# Patient Record
Sex: Male | Born: 1960 | Race: White | Hispanic: No | Marital: Married | Smoking: Former smoker
Health system: Southern US, Community
[De-identification: ages and names within clinical notes are randomized; demographics above are authoritative.]

## PROBLEM LIST (undated history)

## (undated) DIAGNOSIS — K8020 Calculus of gallbladder without cholecystitis without obstruction: Secondary | ICD-10-CM

## (undated) DIAGNOSIS — Z8679 Personal history of other diseases of the circulatory system: Secondary | ICD-10-CM

## (undated) DIAGNOSIS — I10 Essential (primary) hypertension: Secondary | ICD-10-CM

## (undated) DIAGNOSIS — E785 Hyperlipidemia, unspecified: Secondary | ICD-10-CM

## (undated) DIAGNOSIS — F419 Anxiety disorder, unspecified: Secondary | ICD-10-CM

## (undated) DIAGNOSIS — I480 Paroxysmal atrial fibrillation: Secondary | ICD-10-CM

## (undated) DIAGNOSIS — I5032 Chronic diastolic (congestive) heart failure: Secondary | ICD-10-CM

## (undated) DIAGNOSIS — M1990 Unspecified osteoarthritis, unspecified site: Secondary | ICD-10-CM

## (undated) DIAGNOSIS — I2510 Atherosclerotic heart disease of native coronary artery without angina pectoris: Secondary | ICD-10-CM

## (undated) DIAGNOSIS — I422 Other hypertrophic cardiomyopathy: Secondary | ICD-10-CM

## (undated) DIAGNOSIS — Z8719 Personal history of other diseases of the digestive system: Secondary | ICD-10-CM

## (undated) DIAGNOSIS — G4733 Obstructive sleep apnea (adult) (pediatric): Secondary | ICD-10-CM

## (undated) DIAGNOSIS — K219 Gastro-esophageal reflux disease without esophagitis: Secondary | ICD-10-CM

## (undated) DIAGNOSIS — Z9289 Personal history of other medical treatment: Secondary | ICD-10-CM

## (undated) DIAGNOSIS — I340 Nonrheumatic mitral (valve) insufficiency: Secondary | ICD-10-CM

## (undated) DIAGNOSIS — F329 Major depressive disorder, single episode, unspecified: Secondary | ICD-10-CM

## (undated) DIAGNOSIS — F32A Depression, unspecified: Secondary | ICD-10-CM

## (undated) DIAGNOSIS — Z9889 Other specified postprocedural states: Secondary | ICD-10-CM

## (undated) HISTORY — DX: Gastro-esophageal reflux disease without esophagitis: K21.9

## (undated) HISTORY — DX: Other hypertrophic cardiomyopathy: I42.2

## (undated) HISTORY — DX: Nonrheumatic mitral (valve) insufficiency: I34.0

## (undated) HISTORY — DX: Chronic diastolic (congestive) heart failure: I50.32

## (undated) HISTORY — PX: TONSILLECTOMY: SUR1361

## (undated) HISTORY — DX: Atherosclerotic heart disease of native coronary artery without angina pectoris: I25.10

## (undated) HISTORY — DX: Anxiety disorder, unspecified: F41.9

## (undated) HISTORY — DX: Paroxysmal atrial fibrillation: I48.0

## (undated) HISTORY — DX: Essential (primary) hypertension: I10

## (undated) HISTORY — DX: Calculus of gallbladder without cholecystitis without obstruction: K80.20

## (undated) HISTORY — PX: WISDOM TOOTH EXTRACTION: SHX21

## (undated) HISTORY — DX: Hyperlipidemia, unspecified: E78.5

## (undated) HISTORY — DX: Morbid (severe) obesity due to excess calories: E66.01

## (undated) HISTORY — PX: HAND SURGERY: SHX662

## (undated) HISTORY — DX: Depression, unspecified: F32.A

## (undated) HISTORY — DX: Obstructive sleep apnea (adult) (pediatric): G47.33

## (undated) HISTORY — DX: Personal history of other medical treatment: Z92.89

## (undated) HISTORY — DX: Major depressive disorder, single episode, unspecified: F32.9

---

## 2001-11-11 ENCOUNTER — Encounter (HOSPITAL_COMMUNITY): Payer: Self-pay | Admitting: Oncology

## 2001-11-11 ENCOUNTER — Ambulatory Visit (HOSPITAL_COMMUNITY): Admission: RE | Admit: 2001-11-11 | Discharge: 2001-11-11 | Payer: Self-pay | Admitting: Oncology

## 2001-11-20 ENCOUNTER — Other Ambulatory Visit: Admission: RE | Admit: 2001-11-20 | Discharge: 2001-11-20 | Payer: Self-pay

## 2003-04-17 ENCOUNTER — Emergency Department (HOSPITAL_COMMUNITY): Admission: EM | Admit: 2003-04-17 | Discharge: 2003-04-17 | Payer: Self-pay | Admitting: Emergency Medicine

## 2004-04-12 ENCOUNTER — Ambulatory Visit (HOSPITAL_COMMUNITY): Admission: RE | Admit: 2004-04-12 | Discharge: 2004-04-12 | Payer: Self-pay | Admitting: Orthopedic Surgery

## 2004-04-12 ENCOUNTER — Ambulatory Visit (HOSPITAL_BASED_OUTPATIENT_CLINIC_OR_DEPARTMENT_OTHER): Admission: RE | Admit: 2004-04-12 | Discharge: 2004-04-12 | Payer: Self-pay | Admitting: Orthopedic Surgery

## 2008-08-03 ENCOUNTER — Ambulatory Visit: Payer: Self-pay | Admitting: Family Medicine

## 2008-08-03 DIAGNOSIS — E785 Hyperlipidemia, unspecified: Secondary | ICD-10-CM | POA: Insufficient documentation

## 2008-08-03 DIAGNOSIS — Z87442 Personal history of urinary calculi: Secondary | ICD-10-CM | POA: Insufficient documentation

## 2008-08-03 LAB — CONVERTED CEMR LAB
Bilirubin Urine: NEGATIVE
Glucose, Urine, Semiquant: NEGATIVE
Ketones, urine, test strip: NEGATIVE
Nitrite: NEGATIVE
Specific Gravity, Urine: <1.005
Urobilinogen, UA: 0.2
WBC Urine, dipstick: NEGATIVE
pH: 7.5

## 2008-08-31 ENCOUNTER — Ambulatory Visit: Payer: Self-pay | Admitting: Family Medicine

## 2008-08-31 LAB — CONVERTED CEMR LAB
ALT: 42 units/L (ref 0–53)
AST: 32 units/L (ref 0–37)
Albumin: 4.1 g/dL (ref 3.5–5.2)
Alkaline Phosphatase: 74 units/L (ref 39–117)
BUN: 10 mg/dL (ref 6–23)
Basophils Absolute: 0 10*3/uL (ref 0.0–0.1)
Basophils Relative: 0.1 % (ref 0.0–3.0)
Bilirubin Urine: NEGATIVE
Bilirubin, Direct: 0.1 mg/dL (ref 0.0–0.3)
Blood in Urine, dipstick: NEGATIVE
CO2: 31 meq/L (ref 19–32)
Calcium: 9.2 mg/dL (ref 8.4–10.5)
Chloride: 106 meq/L (ref 96–112)
Cholesterol: 192 mg/dL (ref 0–200)
Creatinine, Ser: 0.9 mg/dL (ref 0.4–1.5)
Eosinophils Absolute: 0.1 10*3/uL (ref 0.0–0.7)
Eosinophils Relative: 1.4 % (ref 0.0–5.0)
GFR calc Af Amer: 117 mL/min
GFR calc non Af Amer: 97 mL/min
Glucose, Bld: 110 mg/dL — ABNORMAL HIGH (ref 70–99)
Glucose, Urine, Semiquant: NEGATIVE
HCT: 39.4 % (ref 39.0–52.0)
HDL: 23.9 mg/dL — ABNORMAL LOW (ref >39.0)
Hemoglobin: 13.4 g/dL (ref 13.0–17.0)
Ketones, urine, test strip: NEGATIVE
LDL Cholesterol: 141 mg/dL — ABNORMAL HIGH (ref 0–99)
Lymphocytes Relative: 29.4 % (ref 12.0–46.0)
MCHC: 34.1 g/dL (ref 30.0–36.0)
MCV: 89.4 fL (ref 78.0–100.0)
Monocytes Absolute: 0.6 10*3/uL (ref 0.1–1.0)
Monocytes Relative: 7.9 % (ref 3.0–12.0)
Neutro Abs: 4.6 10*3/uL (ref 1.4–7.7)
Neutrophils Relative %: 61.2 % (ref 43.0–77.0)
Nitrite: NEGATIVE
Platelets: 246 10*3/uL (ref 150–400)
Potassium: 4.1 meq/L (ref 3.5–5.1)
RBC: 4.41 M/uL (ref 4.22–5.81)
RDW: 12.7 % (ref 11.5–14.6)
Sodium: 142 meq/L (ref 135–145)
Specific Gravity, Urine: 1.02
TSH: 3.53 microintl units/mL (ref 0.35–5.50)
Total Bilirubin: 0.9 mg/dL (ref 0.3–1.2)
Total CHOL/HDL Ratio: 8
Total Protein: 7.2 g/dL (ref 6.0–8.3)
Triglycerides: 138 mg/dL (ref 0–149)
Urobilinogen, UA: 1
VLDL: 28 mg/dL (ref 0–40)
WBC Urine, dipstick: NEGATIVE
WBC: 7.5 10*3/uL (ref 4.5–10.5)
pH: 8

## 2008-09-09 ENCOUNTER — Ambulatory Visit: Payer: Self-pay | Admitting: Family Medicine

## 2008-09-09 DIAGNOSIS — K219 Gastro-esophageal reflux disease without esophagitis: Secondary | ICD-10-CM | POA: Insufficient documentation

## 2008-09-09 DIAGNOSIS — F411 Generalized anxiety disorder: Secondary | ICD-10-CM | POA: Insufficient documentation

## 2009-05-02 ENCOUNTER — Telehealth: Payer: Self-pay | Admitting: Family Medicine

## 2009-05-17 ENCOUNTER — Encounter: Payer: Self-pay | Admitting: Family Medicine

## 2011-01-05 NOTE — Op Note (Signed)
NAME:  Harold Scott, Harold Scott                          ACCOUNT NO.:  [id number]   MEDICAL RECORD NO.:  [id number]                   PATIENT TYPE:  AMB   LOCATION:  DSC                                  FACILITY:  [facility]   PHYSICIAN:  Artist Pais. Mina Marble, M.D.           DATE OF BIRTH:  20-Apr-1961   DATE OF PROCEDURE:  04/12/2004  DATE OF DISCHARGE:                                 OPERATIVE REPORT   PREOPERATIVE DIAGNOSIS:  Right fifth metacarpal intraarticular fracture,  metacarpophalangeal joint.   POSTOPERATIVE DIAGNOSIS:  Right fifth metacarpal intraarticular fracture,  metacarpophalangeal joint.   PROCEDURE:  Open reduction and internal fixation of the above fracture using  a 1.5 mm modular hand set T plate with four screws distally and four  proximal.   SURGEON:  Artist Pais. Mina Marble, M.D.   ASSISTANT:  None.   ANESTHESIA:  Axillary block.   TOURNIQUET TIME:  1 hour.   COMPLICATIONS:  None.   DRAINS:  None.   DESCRIPTION OF PROCEDURE:  The patient was taken to the operating room and  after the induction of adequate axillary block analgesia, the right upper  extremity was prepped and draped in the usual sterile fashion.  An Esmarch  was used to exsanguinate the limb and the tourniquet was inflated to 250  mmHg.  At this point in time, a longitudinal incision was made over the  metacarpophalangeal joint of the fifth digit on the right hand.  Skin flaps  were raised accordingly.  The juncture between the extensor mechanism of the  ring and small fingers was incised and the extensor mechanism of the little  finger was retracted ulnarly. At this point in time, a dorsal arthrotomy was  performed.  Upon entering the joint, there was a complex intraarticular  fracture of the metacarpal head and neck junction.  The fracture site was  opened and debrided of clot. It was reduced with an 0.45 K wire and then  plated using a 1.5 mm modular hand set T plate with four screws distally and  four  proximal.  Intraoperative x-ray showed good reduction in both the AP,  lateral and oblique views.  The wound was thoroughly irrigated.  The capsule  was closed with 4-0 Vicryl, the juncture with 4-0 Vicryl and the skin with a  running 3-0 Prolene subcuticular stitch.  Steri-Strips, 4 x 4's, Fluffs and  a compressive dressing and ulnar guard splint were applied.  The patient  tolerated the procedure well.                                               Artist Pais Mina Marble, M.D.    MAW/MEDQ  D:  04/12/2004  T:  04/12/2004  Job:  [id number]

## 2011-07-21 ENCOUNTER — Emergency Department (HOSPITAL_COMMUNITY): Payer: PRIVATE HEALTH INSURANCE

## 2011-07-21 ENCOUNTER — Emergency Department (HOSPITAL_COMMUNITY)
Admission: EM | Admit: 2011-07-21 | Discharge: 2011-07-22 | Disposition: A | Discharge status: Home / Self Care | Payer: PRIVATE HEALTH INSURANCE | Attending: Emergency Medicine | Admitting: Emergency Medicine

## 2011-07-21 ENCOUNTER — Encounter: Payer: Self-pay | Admitting: *Deleted

## 2011-07-21 DIAGNOSIS — R000 Tachycardia, unspecified: Secondary | ICD-10-CM | POA: Insufficient documentation

## 2011-07-21 DIAGNOSIS — I4891 Unspecified atrial fibrillation: Secondary | ICD-10-CM | POA: Insufficient documentation

## 2011-07-21 DIAGNOSIS — R079 Chest pain, unspecified: Secondary | ICD-10-CM | POA: Insufficient documentation

## 2011-07-21 LAB — DIFFERENTIAL
Basophils Absolute: 0 10*3/uL (ref 0.0–0.1)
Basophils Relative: 0 % (ref 0–1)
Eosinophils Relative: 1 % (ref 0–5)
Monocytes Absolute: 1.3 10*3/uL — ABNORMAL HIGH (ref 0.1–1.0)

## 2011-07-21 LAB — BASIC METABOLIC PANEL
CO2: 23 mEq/L (ref 19–32)
Calcium: 9.9 mg/dL (ref 8.4–10.5)
Creatinine, Ser: 0.91 mg/dL (ref 0.50–1.35)
GFR calc Af Amer: >90 mL/min (ref >90)

## 2011-07-21 LAB — CBC
HCT: 42.2 % (ref 39.0–52.0)
MCHC: 34.1 g/dL (ref 30.0–36.0)
MCV: 87.2 fL (ref 78.0–100.0)
RDW: 13.5 % (ref 11.5–15.5)

## 2011-07-21 MED ORDER — SODIUM CHLORIDE 0.9 % IV SOLN
INTRAVENOUS | Status: DC
  Administered 2011-07-21: 20:00:00 via INTRAVENOUS

## 2011-07-21 MED ORDER — DEXTROSE 5 % IV SOLN
5.0000 mg/h | INTRAVENOUS | Status: DC
  Administered 2011-07-21: 5 mg/h via INTRAVENOUS
  Filled 2011-07-21: qty 100

## 2011-07-21 MED ORDER — DILTIAZEM HCL 50 MG/10ML IV SOLN
10.0000 mg | Freq: Once | INTRAVENOUS | Status: AC
  Administered 2011-07-21: 10 mg via INTRAVENOUS
  Filled 2011-07-21: qty 2

## 2011-07-21 MED ORDER — METOPROLOL SUCCINATE ER 25 MG PO TB24: 25.0000 mg | ORAL_TABLET | Freq: Every day | ORAL | Status: DC

## 2011-07-21 MED ORDER — SODIUM CHLORIDE 0.9 % IV BOLUS (SEPSIS)
500.0000 mL | Freq: Once | INTRAVENOUS | Status: AC
  Administered 2011-07-21: 19:00:00 via INTRAVENOUS

## 2011-07-21 MED ORDER — SODIUM CHLORIDE 0.9 % IV SOLN
Freq: Once | INTRAVENOUS | Status: AC
  Administered 2011-07-21: 20:00:00 via INTRAVENOUS

## 2011-07-21 NOTE — ED Notes (Signed)
Susie from Lab called reported CKMB 29.8,  Will notify Dr Adriana Simas immediately

## 2011-07-21 NOTE — ED Notes (Signed)
Pt presents with chest pain, states he feels his heart rate is irregular, clammy, pale and anxious

## 2011-07-21 NOTE — ED Provider Notes (Signed)
History     CSN: [id number] Arrival date & time: 07/21/2011  6:24 PM   First MD Initiated Contact with Patient 07/21/11 1842      Chief Complaint  Patient presents with  . Chest Pain    (Consider location/radiation/quality/duration/timing/severity/associated sxs/prior treatment) HPI....Marland Kitchen irregular heartbeat a brief time ago. Similar symptoms approximately one week ago. No chest pain, dyspnea, diaphoresis, nausea.  Nonsmoker.  Patient admits to being an anxious person. Her last stress at work and at home. Nothing makes symptoms better or worse. Described as moderate.  No past medical history on file.  No past surgical history on file.  No family history on file.  History  Substance Use Topics  . Smoking status: Not on file  . Smokeless tobacco: Not on file  . Alcohol Use: Not on file      Review of Systems  All other systems reviewed and are negative.    Allergies  Penicillins  Home Medications   Current Outpatient Rx  Name Route Sig Dispense Refill  . CLORAZEPATE DIPOTASSIUM 7.5 MG PO TABS Oral Take 7.5 mg by mouth at bedtime.      Marland Kitchen ESOMEPRAZOLE MAGNESIUM 40 MG PO CPDR Oral Take 40 mg by mouth daily before breakfast.      . VITAMIN B-12 1000 MCG PO TABS Oral Take 1,000 mcg by mouth daily.      Marland Kitchen VITAMIN C 500 MG PO TABS Oral Take 500 mg by mouth daily.        BP 115/70  Pulse 78  Temp(Src) 98.4 F (36.9 C) (Oral)  Resp 17  SpO2 99%  Physical Exam  Nursing note and vitals reviewed. Constitutional: He is oriented to person, place, and time. He appears well-developed and well-nourished.  HENT:  Head: Normocephalic and atraumatic.  Eyes: Conjunctivae and EOM are normal. Pupils are equal, round, and reactive to light.  Neck: Normal range of motion. Neck supple.  Cardiovascular:       Irregularly irregular rhythm,  tachycardic  Pulmonary/Chest: Effort normal and breath sounds normal.  Abdominal: Soft. Bowel sounds are normal.  Musculoskeletal: Normal  range of motion.  Neurological: He is alert and oriented to person, place, and time.  Skin: Skin is warm and dry.  Psychiatric: He has a normal mood and affect.    ED Course  Procedures (including critical care time)  Labs Reviewed  CBC - Abnormal; Notable for the following:    WBC 13.7 (*)    All other components within normal limits  DIFFERENTIAL - Abnormal; Notable for the following:    Neutro Abs 8.5 (*)    Monocytes Absolute 1.3 (*)    All other components within normal limits  BASIC METABOLIC PANEL - Abnormal; Notable for the following:    Potassium 3.3 (*)    Glucose, Bld 103 (*)    All other components within normal limits  CARDIAC PANEL(CRET KIN+CKTOT+MB+TROPI) - Abnormal; Notable for the following:    Total CK 1029 (*)    All other components within normal limits  TSH   Dg Chest Port 1 View  07/21/2011  *RADIOLOGY REPORT*  Clinical Data: Atrial fibrillation  PORTABLE CHEST - 1 VIEW  Comparison: None  Findings: Heart and mediastinal contours are within normal limits for portable technique.  Lung volumes are low.  There is slight peribronchial thickening.  No airspace disease or effusion.  Healed right clavicle fracture.  No acute bony abnormality.  IMPRESSION: Peribronchial thickening, which could be related to smoking and may be chronic.  No definite acute findings.  Original Report Authenticated By: Britta Mccreedy, M.D.     No diagnosis found.   Date: 07/21/2011  Rate: 171  Rhythm: atrial fibrillation  QRS Axis: normal  Intervals: normal  ST/T Wave abnormalities: normal  Conduction Disutrbances:none  Narrative Interpretation:   Old EKG Reviewed: none available    Date: 07/21/2011  Rate: 103  Rhythm: atrial fibrillation  QRS Axis: normal  Intervals: normal  ST/T Wave abnormalities: normal  Conduction Disutrbances:none  Narrative Interpretation:   Old EKG Reviewed: changes noted  CRITICAL CARE Performed by: Donnetta Hutching   Total critical care time:  30...  Critical care time was exclusive of separately billable procedures and treating other patients.  Critical care was necessary to treat or prevent imminent or life-threatening deterioration.  Critical care was time spent personally by me on the following activities: development of treatment plan with patient and/or surrogate as well as nursing, discussions with consultants, evaluation of patient's response to treatment, examination of patient, obtaining history from patient or surrogate, ordering and performing treatments and interventions, ordering and review of laboratory studies, ordering and review of radiographic studies, pulse oximetry and re-evaluation of patient's condition. MDM  Initial EKG shows atrial fibrillation rate greater than 120.  Patient responded well to IV Cardizem.  He was rechecked several times and hospital course. He prefers not to be admitted to the hospital. Start a low-dose beta blocker and have him followup with his primary care doctor on Monday. he understands to return if symptoms return. This was discussed with his wife also        Donnetta Hutching, MD 07/21/11 [phone]

## 2011-07-22 LAB — TSH: TSH: 11.502 u[IU]/mL — ABNORMAL HIGH (ref 0.350–4.500)

## 2011-07-22 LAB — CARDIAC PANEL(CRET KIN+CKTOT+MB+TROPI): Total CK: 1029 U/L — ABNORMAL HIGH (ref 7–232)

## 2011-07-24 ENCOUNTER — Encounter: Payer: Self-pay | Admitting: Family Medicine

## 2011-07-24 ENCOUNTER — Ambulatory Visit (INDEPENDENT_AMBULATORY_CARE_PROVIDER_SITE_OTHER): Payer: PRIVATE HEALTH INSURANCE | Admitting: Family Medicine

## 2011-07-24 DIAGNOSIS — K649 Unspecified hemorrhoids: Secondary | ICD-10-CM

## 2011-07-24 DIAGNOSIS — K219 Gastro-esophageal reflux disease without esophagitis: Secondary | ICD-10-CM

## 2011-07-24 DIAGNOSIS — I480 Paroxysmal atrial fibrillation: Secondary | ICD-10-CM

## 2011-07-24 DIAGNOSIS — I4819 Other persistent atrial fibrillation: Secondary | ICD-10-CM | POA: Insufficient documentation

## 2011-07-24 DIAGNOSIS — I4891 Unspecified atrial fibrillation: Secondary | ICD-10-CM

## 2011-07-24 MED ORDER — HYDROCORTISONE 2.5 % RE CREA: TOPICAL_CREAM | RECTAL | Status: DC

## 2011-07-24 NOTE — Progress Notes (Signed)
  Subjective:    Patient ID: Harold Scott, male    DOB: 22-Jun-1961, 50 y.o.   MRN: [id number]  HPI Harold Scott  Is a 50 year old, married male, nonsmoker, who comes in today following an evaluation in the emergency room for abdominal and chest pain.  He relates 4 episodes over the past 6 weeks of severe midepigastric abdominal pain.  He states the first episode happened at 9 a.m. On October the 11th of this year.  He was eating a couple doughnuts and coffee and then reclined on his seat in his truck and developed midepigastric abdominal pain, which radiated up into his throat.  He sat up and drank some Sprite and in some water and his symptoms went away.  No cardiac symptoms in the event occurred at rest.  Dannielle Huh had two other episodes after that and then a fourth episode on December the first.  That episode.  He was at home working.  He ate some food from a fast food restaurant and laid down at home and hour  a half later woke up with severe midepigastric abdominal pain.  He went to the emergency room and was found to be in atrial fibrillation.  He was given IV Cardizem and declined admission.  He was discharged on a beta-blocker in sinus rhythm.  Toprol 25 mg daily.  He has never had a history of any cardiac symptoms in the past.  He has had a history of reflux esophagitis and has been on Nexium 40 mg daily.   Review of Systems Cardiac pulmonary, and GI review of systems other than above, negative.  He is a nonsmoker.  He does drink a fair amount of caffeine.  Consumes chewing gum, and one or two alcohol beverages per day    Objective:   Physical Exam  Well developed, well nourished, male no acute distress.  Cardiopulmonary exam negative dumps, and negative      Assessment & Plan:  Reflux esophagitis, uncontrolled with Nexium 40 mg daily.  Plan GI consult, fat free diet.  Also, question gallbladder disease.  Ultrasound of the gallbladder.  Episodic history of AF now in sinus rhythm.   Cardiac evaluation following GI evaluation.  Continue beta blocker 25 mg daily

## 2011-07-24 NOTE — Patient Instructions (Signed)
Complete fat free diet.  No alcohol, caffeine, or peppermint.  Sleep on two pillows.  Double the Nexium to one twice daily.  We will get you set up for a GI consult ASAP and an ultrasound of her gallbladder.  Once we do the GI evaluation, then we will get you set up for a follow-up cardiac evaluation

## 2011-07-24 NOTE — Progress Notes (Signed)
Addended by: Kern Reap B on: 07/24/2011 02:32 PM   Modules accepted: Orders

## 2011-07-25 ENCOUNTER — Other Ambulatory Visit: Payer: Self-pay | Admitting: Family Medicine

## 2011-07-25 ENCOUNTER — Encounter: Payer: Self-pay | Admitting: Gastroenterology

## 2011-07-25 MED ORDER — ESOMEPRAZOLE MAGNESIUM 40 MG PO CPDR: 40.0000 mg | DELAYED_RELEASE_CAPSULE | Freq: Two times a day (BID) | ORAL | Status: DC

## 2011-07-25 NOTE — Telephone Encounter (Signed)
Pt was told to double up on nexium 40mg . Pt need new rx sent to [facility] [phone]. Pt needs to know last pneumonia shot.

## 2011-07-25 NOTE — Telephone Encounter (Signed)
Rx sent and spoke with patient. 

## 2011-08-08 ENCOUNTER — Encounter: Payer: Self-pay | Admitting: Gastroenterology

## 2011-08-08 ENCOUNTER — Telehealth: Payer: Self-pay | Admitting: Gastroenterology

## 2011-08-08 ENCOUNTER — Ambulatory Visit
Admission: RE | Admit: 2011-08-08 | Discharge: 2011-08-08 | Disposition: A | Discharge status: Home / Self Care | Payer: PRIVATE HEALTH INSURANCE | Source: Ambulatory Visit | Attending: Family Medicine | Admitting: Family Medicine

## 2011-08-08 ENCOUNTER — Ambulatory Visit (INDEPENDENT_AMBULATORY_CARE_PROVIDER_SITE_OTHER): Payer: PRIVATE HEALTH INSURANCE | Admitting: Gastroenterology

## 2011-08-08 VITALS — BP 130/76 | HR 60 | Ht 68.0 in | Wt 255.8 lb

## 2011-08-08 DIAGNOSIS — K8020 Calculus of gallbladder without cholecystitis without obstruction: Secondary | ICD-10-CM

## 2011-08-08 DIAGNOSIS — K219 Gastro-esophageal reflux disease without esophagitis: Secondary | ICD-10-CM

## 2011-08-08 DIAGNOSIS — K6289 Other specified diseases of anus and rectum: Secondary | ICD-10-CM

## 2011-08-08 MED ORDER — LIDOCAINE (ANORECTAL) 5 % EX GEL: 1.0000 "application " | Freq: Two times a day (BID) | CUTANEOUS | Status: DC

## 2011-08-08 NOTE — Patient Instructions (Addendum)
You have been scheduled for a Endoscopy. Instructions have been provided. We have sent lidocaine gel to your pharmacy, you may use this in conjunction with your hydrocortisone cream that you already have at home. Apply lidocaine to the anal area twice daily as needed for pain. We have given you anti reflux measures to follow. You have been added to our recall system for a colonoscopy in March of 2013. Rectal care instructions have been provided. CC:  Dr Governor Specking

## 2011-08-08 NOTE — Progress Notes (Signed)
History of Present Illness: This is a 50 year old male who has a very long history of GERD and has been treated with PPI therapy for many years. Most recently he has been treated with Nexium 40 mg daily. He notes occasional breakthrough symptoms with heartburn. He also notes a fluttering sensation in his upper chest intermittently and particularly in the mornings. After meals he has had 2 or 3 episodes of mild epigastric pain that resolved within 30-60 minutes. Earlier this month he had an episode of atrial fibrillation associated with palpitations and a fluttering sensation in his upper chest and he was evaluated in the emergency room and discharged home. He underwent abdominal ultrasound today which showed cholelithiasis. In addition he has had significant rectal pain and associated with bowel movements for the past week. These symptoms have improved on stool softeners and sitz baths. Denies weight loss, abdominal pain, constipation, diarrhea, change in stool caliber, melena, hematochezia, nausea, vomiting, dysphagia, reflux symptoms, chest pain.  Review of Systems: Pertinent positive and negative review of systems were noted in the above HPI section. All other review of systems were otherwise negative.  Current Medications, Allergies, Past Medical History, Past Surgical History, Family History and Social History were reviewed in [employer] record.  Physical Exam: General: Well developed , well nourished, obese, no acute distress Head: Normocephalic and atraumatic Eyes:  sclerae anicteric, EOMI Ears: Normal auditory acuity Mouth: No deformity or lesions Neck: Supple, no masses or thyromegaly Lungs: Clear throughout to auscultation Heart: Regular rate and rhythm; no murmurs, rubs or bruits Abdomen: Soft, non tender and non distended. No masses, hepatosplenomegaly or hernias noted. Normal Bowel sounds Rectal: no lesions, significant anal canal tenderness, Hemoccult negative  brown stool  Musculoskeletal: Symmetrical with no gross deformities  Skin: No lesions on visible extremities Pulses:  Normal pulses noted Extremities: No clubbing, cyanosis, edema or deformities noted Neurological: Alert oriented x 4, grossly nonfocal Cervical Nodes:  No significant cervical adenopathy Inguinal Nodes: No significant inguinal adenopathy Psychological:  Alert and cooperative. Anxious  Assessment and Recommendations:  1. Chronic GERD. Symptoms under better control on Nexium twice daily. Continue this therapy along with all standard antireflux measures. I am not certain that the fluttering sensation in his upper chest is related to GERD. Further evaluation with upper endoscopy. The risks, benefits, and alternatives to endoscopy with possible biopsy and possible dilation were discussed with the patient and they consent to proceed.    2. New-onset atrial fibrillation. Further management per Dr. Tawanna Cooler.  3. Cholelithiasis. A portion of the symptoms might be gallstone related, particularly postprandial episodes of epigastric pain, however these symptoms could be reflux related. Pending findings at upper endoscopy and response twice a day PPI therapy, a surgical consult may be needed.  4. Anal pain. Possible anal fissure or hemorrhoid. At the lidocaine cream and all standard rectal care instructions. Continue hydrocortisone cream. Consider further evaluation with colonoscopy symptoms persist.  5. Colorectal cancer screening. Average risk. Plan for colonoscopy at age 78.

## 2011-08-08 NOTE — Telephone Encounter (Signed)
Pharmacy aware that 2% is ok

## 2011-08-09 ENCOUNTER — Telehealth: Payer: Self-pay | Admitting: Gastroenterology

## 2011-08-09 NOTE — Telephone Encounter (Signed)
I spoke with Fleet Contras at Dr Nelida Meuse office.  Dr Tawanna Cooler has verbally asked her to make a surgical referral.  She will schedule this for after the 8th of January following the EGD.  I have explained to the patient that Dr Tawanna Cooler feels he should see a surgeon about his gallbladder and that his office will call him back once the appt has been scheduled.

## 2011-08-09 NOTE — Telephone Encounter (Signed)
Patient called and wanted [place] to communicate with dr Nelida Meuse office about a surgical referral.  According to the patient he was contacted by Dr Nelida Meuse office this am that he needed to see a surgeon about his gallbladder.  He is upset that Dr Russella Dar did not tell him this yesterday in the office.  I have reviewed Dr Ardell Isaacs note with the patient that his plan was to do EGD first and if it was unrevealing then would possibly need surgical referral.  I see no documentation in the chart or orders from Dr Tawanna Cooler about surgical referral.  I have left a message for Dr Nelida Meuse nurse to please call me back to discuss.

## 2011-08-13 ENCOUNTER — Other Ambulatory Visit: Payer: Self-pay | Admitting: Family Medicine

## 2011-08-13 DIAGNOSIS — K8020 Calculus of gallbladder without cholecystitis without obstruction: Secondary | ICD-10-CM

## 2011-08-28 ENCOUNTER — Ambulatory Visit (AMBULATORY_SURGERY_CENTER): Payer: PRIVATE HEALTH INSURANCE | Admitting: Gastroenterology

## 2011-08-28 ENCOUNTER — Encounter: Payer: Self-pay | Admitting: Gastroenterology

## 2011-08-28 DIAGNOSIS — K219 Gastro-esophageal reflux disease without esophagitis: Secondary | ICD-10-CM

## 2011-08-28 DIAGNOSIS — R079 Chest pain, unspecified: Secondary | ICD-10-CM

## 2011-08-28 MED ORDER — SODIUM CHLORIDE 0.9 % IV SOLN: 500.0000 mL | INTRAVENOUS | Status: DC

## 2011-08-28 NOTE — Progress Notes (Signed)
Patient did not experience any of the following events: a burn prior to discharge; a fall within the facility; wrong site/side/patient/procedure/implant event; or a hospital transfer or hospital admission upon discharge from the facility. (G8907) Patient did not have preoperative order for IV antibiotic SSI prophylaxis. (G8918)  

## 2011-08-28 NOTE — Op Note (Signed)
[facility] [address] [place], [place]  [address]  ENDOSCOPY PROCEDURE REPORT  PATIENT:  Harold, Scott  MR#:  [id number] BIRTHDATE:  22-Feb-1961, 50 yrs. old  GENDER:  male ENDOSCOPIST:  Judie Petit T. Russella Dar, MD, [facility] Referred by:  Eugenio Hoes Tawanna Cooler, M.D. PROCEDURE DATE:  08/28/2011 PROCEDURE:  EGD with biopsy, [id number] ASA CLASS:  Class II INDICATIONS:  GERD, chest pain MEDICATIONS:  MAC sedation, administered by CRNA, propofol (Diprivan) 150 mg IV TOPICAL ANESTHETIC:  none DESCRIPTION OF PROCEDURE:   After the risks benefits and alternatives of the procedure were thoroughly explained, informed consent was obtained.  The LB GIF-H180 [id number] endoscope was introduced through the mouth and advanced to the second portion of the duodenum, without limitations.  The instrument was slowly withdrawn as the mucosa was fully examined. <<PROCEDUREIMAGES>> Irregular Z-line at the gastroesophageal junction. Multiple biopsies were obtained and sent to pathology.  Otherwise normal esophagus.  The stomach was entered and closely examined. The pylorus, antrum, angularis, and lesser curvature were well visualized, including a retroflexed view of the cardia and fundus. The stomach wall was normally distensable. The scope passed easily through the pylorus into the duodenum.  The duodenal bulb was normal in appearance, as was the postbulbar duodenum.  Retroflexed views revealed a hiatal hernia, small.  The scope was then withdrawn from the patient and the procedure completed.  COMPLICATIONS:  None  ENDOSCOPIC IMPRESSION: 1) Irregular Z-line at the gastroesophageal junction 2) Small hiatal hernia  RECOMMENDATIONS: 1) Anti-reflux regimen long term 2) Await pathology results 3) Continue PPI long term  Mohini Heathcock T. Russella Dar, MD, [facility]  n. eSIGNED:   Venita Lick. Severin Bou at 08/28/2011 01:47 PM  Alroy Bailiff, [phone]

## 2011-08-29 ENCOUNTER — Telehealth: Payer: Self-pay | Admitting: *Deleted

## 2011-08-29 NOTE — Telephone Encounter (Signed)
Left message

## 2011-08-31 ENCOUNTER — Encounter (INDEPENDENT_AMBULATORY_CARE_PROVIDER_SITE_OTHER): Payer: PRIVATE HEALTH INSURANCE | Admitting: Surgery

## 2011-09-03 ENCOUNTER — Encounter: Payer: Self-pay | Admitting: Gastroenterology

## 2011-09-10 ENCOUNTER — Other Ambulatory Visit: Payer: Self-pay | Admitting: *Deleted

## 2011-09-10 ENCOUNTER — Other Ambulatory Visit: Payer: Self-pay | Admitting: Family Medicine

## 2011-09-10 MED ORDER — METOPROLOL SUCCINATE ER 25 MG PO TB24: 25.0000 mg | ORAL_TABLET | Freq: Every day | ORAL | Status: DC

## 2011-09-10 NOTE — Telephone Encounter (Signed)
Pt need new rx toprol-xl 25 mg [facility]

## 2011-10-10 ENCOUNTER — Other Ambulatory Visit: Payer: Self-pay | Admitting: Family Medicine

## 2011-11-12 ENCOUNTER — Telehealth: Payer: Self-pay | Admitting: *Deleted

## 2011-11-12 NOTE — Telephone Encounter (Signed)
Pt is having episodes of irreg heart rate vs gall bladder pain x months, but cannot come in the office today.  Appt scheduled tomorrow with Dr. Tawanna Cooler.  Advised to go to the ER if any suspicious symptoms or any symptoms at all of AF.  He states he knows what it feels like, and has had it for months.

## 2011-11-13 ENCOUNTER — Encounter: Payer: Self-pay | Admitting: Family Medicine

## 2011-11-13 ENCOUNTER — Ambulatory Visit (INDEPENDENT_AMBULATORY_CARE_PROVIDER_SITE_OTHER): Payer: PRIVATE HEALTH INSURANCE | Admitting: Family Medicine

## 2011-11-13 VITALS — BP 130/90 | Temp 98.5°F | Wt 246.0 lb

## 2011-11-13 DIAGNOSIS — K8020 Calculus of gallbladder without cholecystitis without obstruction: Secondary | ICD-10-CM

## 2011-11-13 DIAGNOSIS — I480 Paroxysmal atrial fibrillation: Secondary | ICD-10-CM

## 2011-11-13 DIAGNOSIS — I4891 Unspecified atrial fibrillation: Secondary | ICD-10-CM

## 2011-11-13 NOTE — Patient Instructions (Signed)
Stay on alcohol and caffeine free diet  We will get you set up for a monitor in cardiology

## 2011-11-13 NOTE — Progress Notes (Signed)
  Subjective:    Patient ID: Harold Scott, male    DOB: 1961-01-22, 51 y.o.   MRN: [id number]  HPI Harold Scott is a 51 year old married male nonsmoker who comes in today for evaluation of rapid heart rate  In the past 2 weeks he's had 2 episodes of rapid heart rate that lasted for couple hours. Both seem to be triggered by alcohol on one occasion he had 3 beers and a shot of bourbon. No chest pain.  He has been on a fat-free diet has lost 7 pounds because he has a history of gallbladder disease and gallstones however he is currently asymptomatic no abdominal pain pc    Review of Systems    general and cardiovascular review of systems otherwise negative Objective:   Physical Exam Well-developed well-nourished male in no acute distress cardiopulmonary exam normal       Assessment & Plan:  Episodes of rapid heart rate plan Holter monitor stop all alcohol and caffeine

## 2011-11-14 ENCOUNTER — Other Ambulatory Visit: Payer: Self-pay | Admitting: Family Medicine

## 2011-11-14 DIAGNOSIS — R000 Tachycardia, unspecified: Secondary | ICD-10-CM | POA: Insufficient documentation

## 2011-11-15 ENCOUNTER — Telehealth: Payer: Self-pay

## 2011-11-19 NOTE — Telephone Encounter (Signed)
PATIENT HAS APPT. FOR MONITOR ON 4/3

## 2011-11-21 ENCOUNTER — Encounter (INDEPENDENT_AMBULATORY_CARE_PROVIDER_SITE_OTHER): Payer: PRIVATE HEALTH INSURANCE

## 2011-11-21 DIAGNOSIS — R000 Tachycardia, unspecified: Secondary | ICD-10-CM

## 2011-11-28 ENCOUNTER — Telehealth: Payer: Self-pay | Admitting: Family Medicine

## 2011-11-28 ENCOUNTER — Ambulatory Visit (INDEPENDENT_AMBULATORY_CARE_PROVIDER_SITE_OTHER): Payer: PRIVATE HEALTH INSURANCE | Admitting: Family Medicine

## 2011-11-28 ENCOUNTER — Encounter: Payer: Self-pay | Admitting: Family Medicine

## 2011-11-28 VITALS — BP 120/84 | Temp 98.2°F | Wt 240.0 lb

## 2011-11-28 DIAGNOSIS — IMO0001 Reserved for inherently not codable concepts without codable children: Secondary | ICD-10-CM | POA: Insufficient documentation

## 2011-11-28 DIAGNOSIS — J45901 Unspecified asthma with (acute) exacerbation: Secondary | ICD-10-CM

## 2011-11-28 MED ORDER — PREDNISONE 20 MG PO TABS: ORAL_TABLET | ORAL | Status: DC

## 2011-11-28 MED ORDER — ALBUTEROL SULFATE HFA 108 (90 BASE) MCG/ACT IN AERS: 2.0000 | INHALATION_SPRAY | Freq: Four times a day (QID) | RESPIRATORY_TRACT | Status: DC | PRN

## 2011-11-28 NOTE — Telephone Encounter (Signed)
Pt was seen today for asthma. Pt has cough would like to know it is ok to take delsym. Pt is aware MD out of office this afternoon. Pt request to speak with triaged nurse

## 2011-11-28 NOTE — Telephone Encounter (Signed)
Discussed with Dr Caryl Never and he said OK to take Delsym for cough

## 2011-11-28 NOTE — Patient Instructions (Signed)
Drink lots of water  Rest at home  Take the prednisone as directed  Albuterol 2 puffs 4 times daily  Return on Friday for followup sooner if any problems

## 2011-11-28 NOTE — Progress Notes (Signed)
  Subjective:    Patient ID: Harold Scott, male    DOB: 08-01-1961, 51 y.o.   MRN: [id number]  HPI Harold Scott is a 51 year old married male nonsmoker who comes in today for evaluation of asthma  He states he felt well until last Saturday afternoon when he developed head congestion postnasal drip and wheezing. [place] beach. They have a trailer at [facility] he does not think there is any mold there he said a history of allergic rhinitis but never asthma   Review of Systems    general and pulmonary review of systems otherwise negative Objective:   Physical Exam Well-developed well-nourished male in no acute distress her perforate 10 and unlabored  HEENT negative neck was supple no adenopathy lungs showed good breath sounds symmetrical bilateral wheezing mild to moderate       Assessment & Plan:  Asthma plan prednisone burst and taper albuterol 2 puffs 4 times a day return in 2 days

## 2011-11-29 ENCOUNTER — Telehealth: Payer: Self-pay | Admitting: Family Medicine

## 2011-11-29 NOTE — Telephone Encounter (Signed)
Spoke to wife

## 2011-11-29 NOTE — Telephone Encounter (Signed)
Patient's spouse called stating that he is not sleeping due to excessive coughing and she wants to know if she can give him her cough meds with codone that Dr. Clent Ridges prescribed for her. Patient has an appt for tomorrow and I offered to have him come back today instead but his wife stated he is too weak to come. Please advise.

## 2011-11-29 NOTE — Telephone Encounter (Signed)
Okay to use cough syrup 1/2-1 teaspoon 3 times daily as needed

## 2011-11-30 ENCOUNTER — Ambulatory Visit (INDEPENDENT_AMBULATORY_CARE_PROVIDER_SITE_OTHER): Payer: PRIVATE HEALTH INSURANCE | Admitting: Family Medicine

## 2011-11-30 ENCOUNTER — Encounter: Payer: Self-pay | Admitting: Family Medicine

## 2011-11-30 VITALS — BP 110/80 | Temp 98.2°F | Wt 241.0 lb

## 2011-11-30 DIAGNOSIS — J45901 Unspecified asthma with (acute) exacerbation: Secondary | ICD-10-CM

## 2011-11-30 DIAGNOSIS — D2330 Other benign neoplasm of skin of unspecified part of face: Secondary | ICD-10-CM | POA: Insufficient documentation

## 2011-11-30 DIAGNOSIS — IMO0001 Reserved for inherently not codable concepts without codable children: Secondary | ICD-10-CM

## 2011-11-30 NOTE — Progress Notes (Signed)
  Subjective:    Patient ID: Harold Scott, male    DOB: 05/31/1961, 51 y.o.   MRN: [id number]  HPI Harold Scott is a 51 year old male who comes in today for followup of asthma and to have a lesion removed from the lower right side of his face  We started him on 60 mg of prednisone 2 days ago he comes back today for followup. States he feels much better but still wheezing. He was able to sleep last night. No side effects from the medication  He's had a lesion on his right lower chin for many years however it seems to be increasing in size he would like it removed     Review of Systems    general pulmonary dermatologic review of systems otherwise negative Objective:   Physical Exam  Well-developed well-nourished male in no acute distress examination along shows symmetrical and increasing breath sounds still wheezing mild to moderate  He was taken to the treatment room and after informed consent the lesion was anesthetized with 1% Xylocaine with epinephrine and removed. The bases cauterized specimen was sent for pathologic analysis clinically appears to be a dysplastic nevus.      Assessment & Plan:  Asthma improving plan Taper prednisone slowly followup in 4 weeks #2 dysplastic nevus removed Path pending

## 2011-11-30 NOTE — Patient Instructions (Signed)
Take 3 prednisone tablets Saturday Sunday Monday and Tuesday begin tapering,,,,,,,,, 2 tabs for 3 days, 1 tab for 3 days, a half a tab for 3 days, then a half a tablet Monday Wednesday Friday for a 3 week taper  Return in 4 weeks for followup sooner if any problems or if you have a problem while on gone then call and see Dr. Ladell Pier.

## 2011-11-30 NOTE — Progress Notes (Signed)
Addended by: Kern Reap B on: 11/30/2011 03:07 PM   Modules accepted: Orders

## 2011-12-27 ENCOUNTER — Encounter: Payer: Self-pay | Admitting: Family Medicine

## 2011-12-27 ENCOUNTER — Ambulatory Visit (INDEPENDENT_AMBULATORY_CARE_PROVIDER_SITE_OTHER): Payer: PRIVATE HEALTH INSURANCE | Admitting: Family Medicine

## 2011-12-27 VITALS — BP 130/90 | Temp 98.4°F | Wt 244.0 lb

## 2011-12-27 DIAGNOSIS — J45901 Unspecified asthma with (acute) exacerbation: Secondary | ICD-10-CM

## 2011-12-27 DIAGNOSIS — IMO0001 Reserved for inherently not codable concepts without codable children: Secondary | ICD-10-CM

## 2011-12-27 DIAGNOSIS — K644 Residual hemorrhoidal skin tags: Secondary | ICD-10-CM

## 2011-12-27 NOTE — Progress Notes (Signed)
  Subjective:    Patient ID: [name], male    DOB: 10/12/1960, 51 y.o.   MRN: [id number]  HPI Mekel is a 51 year old male who comes in today for followup of asthma  He finished his prednisone 10 days ago and states he feels well. We never did really figure out what the trigger was. Since he feels well we elected to treat him with albuterol when necessary prednisone when necessary however if he has more recurrence and we'll consider further diagnostic evaluation. [place] where he thinks the asthma was triggered however he could not find evidence of mold   Review of Systems General and pulmonary review of systems negative    Objective:   Physical Exam Well-developed well-nourished in no acute distress lungs clear to auscultation       Assessment & Plan:  Asthma resolved plan return when necessary  Skin tags x31 right eyelid 3 left removed under local no charge

## 2011-12-27 NOTE — Patient Instructions (Signed)
Use the albuterol when necessary  If you have a flare once or twice a year then take a short course of prednisone  Return when necessary

## 2012-06-11 ENCOUNTER — Encounter: Payer: Self-pay | Admitting: Gastroenterology

## 2012-06-27 ENCOUNTER — Emergency Department (HOSPITAL_COMMUNITY)
Admission: EM | Admit: 2012-06-27 | Discharge: 2012-06-27 | Disposition: A | Discharge status: Home / Self Care | Payer: PRIVATE HEALTH INSURANCE | Attending: Emergency Medicine | Admitting: Emergency Medicine

## 2012-06-27 ENCOUNTER — Emergency Department (HOSPITAL_COMMUNITY): Payer: PRIVATE HEALTH INSURANCE

## 2012-06-27 ENCOUNTER — Encounter (HOSPITAL_COMMUNITY): Payer: Self-pay | Admitting: Emergency Medicine

## 2012-06-27 DIAGNOSIS — R109 Unspecified abdominal pain: Secondary | ICD-10-CM

## 2012-06-27 DIAGNOSIS — Z79899 Other long term (current) drug therapy: Secondary | ICD-10-CM | POA: Insufficient documentation

## 2012-06-27 DIAGNOSIS — I4891 Unspecified atrial fibrillation: Secondary | ICD-10-CM | POA: Insufficient documentation

## 2012-06-27 DIAGNOSIS — F341 Dysthymic disorder: Secondary | ICD-10-CM | POA: Insufficient documentation

## 2012-06-27 DIAGNOSIS — K700 Alcoholic fatty liver: Secondary | ICD-10-CM | POA: Insufficient documentation

## 2012-06-27 DIAGNOSIS — K219 Gastro-esophageal reflux disease without esophagitis: Secondary | ICD-10-CM | POA: Insufficient documentation

## 2012-06-27 DIAGNOSIS — R0602 Shortness of breath: Secondary | ICD-10-CM | POA: Insufficient documentation

## 2012-06-27 DIAGNOSIS — K8020 Calculus of gallbladder without cholecystitis without obstruction: Secondary | ICD-10-CM | POA: Insufficient documentation

## 2012-06-27 DIAGNOSIS — Z87891 Personal history of nicotine dependence: Secondary | ICD-10-CM | POA: Insufficient documentation

## 2012-06-27 DIAGNOSIS — Z8659 Personal history of other mental and behavioral disorders: Secondary | ICD-10-CM | POA: Insufficient documentation

## 2012-06-27 DIAGNOSIS — R079 Chest pain, unspecified: Secondary | ICD-10-CM | POA: Insufficient documentation

## 2012-06-27 LAB — CBC WITH DIFFERENTIAL/PLATELET
HCT: 43.6 % (ref 39.0–52.0)
Hemoglobin: 15.1 g/dL (ref 13.0–17.0)
Lymphocytes Relative: 18 % (ref 12–46)
Lymphs Abs: 2 10*3/uL (ref 0.7–4.0)
MCHC: 34.6 g/dL (ref 30.0–36.0)
Monocytes Absolute: 0.8 10*3/uL (ref 0.1–1.0)
Monocytes Relative: 7 % (ref 3–12)
Neutro Abs: 8.5 10*3/uL — ABNORMAL HIGH (ref 1.7–7.7)
Neutrophils Relative %: 74 % (ref 43–77)
RBC: 4.96 MIL/uL (ref 4.22–5.81)
WBC: 11.4 10*3/uL — ABNORMAL HIGH (ref 4.0–10.5)

## 2012-06-27 LAB — URINALYSIS, MICROSCOPIC ONLY
Bilirubin Urine: NEGATIVE
Glucose, UA: NEGATIVE mg/dL
Ketones, ur: NEGATIVE mg/dL
Leukocytes, UA: NEGATIVE
Nitrite: NEGATIVE
Protein, ur: NEGATIVE mg/dL
pH: 5.5 (ref 5.0–8.0)

## 2012-06-27 LAB — COMPREHENSIVE METABOLIC PANEL
ALT: 34 U/L (ref 0–53)
Alkaline Phosphatase: 88 U/L (ref 39–117)
BUN: 10 mg/dL (ref 6–23)
CO2: 25 mEq/L (ref 19–32)
Chloride: 97 mEq/L (ref 96–112)
GFR calc Af Amer: >90 mL/min (ref >90)
Glucose, Bld: 143 mg/dL — ABNORMAL HIGH (ref 70–99)
Potassium: 4.1 mEq/L (ref 3.5–5.1)
Sodium: 134 mEq/L — ABNORMAL LOW (ref 135–145)
Total Bilirubin: 0.6 mg/dL (ref 0.3–1.2)
Total Protein: 8 g/dL (ref 6.0–8.3)

## 2012-06-27 LAB — POCT I-STAT TROPONIN I

## 2012-06-27 LAB — LACTIC ACID, PLASMA: Lactic Acid, Venous: 2.1 mmol/L (ref 0.5–2.2)

## 2012-06-27 MED ORDER — HYDROMORPHONE HCL PF 1 MG/ML IJ SOLN
1.0000 mg | Freq: Once | INTRAMUSCULAR | Status: AC
  Administered 2012-06-27: 1 mg via INTRAVENOUS
  Filled 2012-06-27: qty 1

## 2012-06-27 MED ORDER — SODIUM CHLORIDE 0.9 % IV BOLUS (SEPSIS)
500.0000 mL | Freq: Once | INTRAVENOUS | Status: AC
  Administered 2012-06-27: 500 mL via INTRAVENOUS

## 2012-06-27 MED ORDER — IOHEXOL 300 MG/ML  SOLN
100.0000 mL | Freq: Once | INTRAMUSCULAR | Status: AC | PRN
  Administered 2012-06-27: 100 mL via INTRAVENOUS

## 2012-06-27 MED ORDER — ONDANSETRON HCL 4 MG/2ML IJ SOLN
4.0000 mg | Freq: Once | INTRAMUSCULAR | Status: AC
  Administered 2012-06-27: 4 mg via INTRAVENOUS
  Filled 2012-06-27: qty 2

## 2012-06-27 MED ORDER — PANTOPRAZOLE SODIUM 40 MG IV SOLR
40.0000 mg | Freq: Once | INTRAVENOUS | Status: AC
  Administered 2012-06-27: 40 mg via INTRAVENOUS
  Filled 2012-06-27: qty 40

## 2012-06-27 MED ORDER — FENTANYL CITRATE 0.05 MG/ML IJ SOLN: 50.0000 ug | Freq: Once | INTRAMUSCULAR | Status: DC

## 2012-06-27 MED ORDER — GI COCKTAIL ~~LOC~~
30.0000 mL | Freq: Once | ORAL | Status: AC
  Administered 2012-06-27: 30 mL via ORAL
  Filled 2012-06-27: qty 30

## 2012-06-27 MED ORDER — HYDROMORPHONE HCL 4 MG PO TABS: 4.0000 mg | ORAL_TABLET | ORAL | Status: DC | PRN

## 2012-06-27 MED ORDER — HYDROMORPHONE HCL PF 2 MG/ML IJ SOLN
2.0000 mg | Freq: Once | INTRAMUSCULAR | Status: AC
  Administered 2012-06-27: 2 mg via INTRAVENOUS
  Filled 2012-06-27: qty 1

## 2012-06-27 NOTE — ED Notes (Signed)
Patient given discharge instructions, information, prescriptions, and diet order. Patient states that they adequately understand discharge information given and to return to ED if symptoms return or worsen.     

## 2012-06-27 NOTE — ED Provider Notes (Signed)
6:07 PM Assumed care from Dr. Silverio Lay and Dr. Sherilyn Dacosta.  Reviewed.  The chart and reevaluated the patient.  I agree with her assessment and plan.  Reviewed the test results.  Discussed results with the patient.  His pain is in the left upper abdomen.  He has no tenderness and no signs of an acute abdomen.  The pain has decreased to 4/10.  He has a gastroenterologist.  He prefers to go home, and followup with his GI doctor rather than be admitted.  Cheri Guppy, MD 06/27/12 [phone]

## 2012-06-27 NOTE — ED Notes (Signed)
Pt presenting to ed with c/o abdominal pain. Pt denies nausea, vomiting and diarrhea at this time. Pt states last normal bowel movement this morning. Pt states history of gallstones. Pt states pain radiates into his chest.

## 2012-06-27 NOTE — ED Provider Notes (Signed)
Medical screening examination/treatment/procedure(s) were conducted as a shared visit with non-physician practitioner(s) and myself.  I personally evaluated the patient during the encounter  Harold Scott is a 51 y.o. male hx of GERD, gallstones here with epigastric pain. He was initially tender on epigastrium and RUQ. US showed gallstones but no cholecystitis. He was still having pain after multiple rounds of dilaudid. Surgery evaluated him and thought that his pain is unlikely from gallstones alone. His lactate is nl. Labs otherwise unremarkable. CT ab/pel ordered. I signed out to Dr. Nino Parsley.    Richardean Canal, MD 06/27/12 1556

## 2012-06-27 NOTE — Consult Note (Signed)
General Surgery [facility]  Patient seen & examined in ER.  Wife at bedside.  Severe, constant left epigastric pain since 4 AM today.  No colic, no radiation, no nausea or emesis.  LFT's normal.  USN with cholelithiasis, but no wall thickening, no dilatation, no pericholecystic fluid, no sonographic Murphy's sign.  Doubt pain is biliary in origin.  Patient has hx of esophagitis.  Will ask GI to evaluate.  No plans for acute operative intervention at present.  Velora Heckler, MD, [facility] Office: [phone]

## 2012-06-27 NOTE — ED Provider Notes (Signed)
History     CSN: [id number]  Arrival date & time 06/27/12  2536   First MD Initiated Contact with Patient 06/27/12 [phone]      Chief Complaint  Patient presents with  . Abdominal Pain    (Consider location/radiation/quality/duration/timing/severity/associated sxs/prior treatment) HPI Comments: Patient awoke from sleep this morning with bilateral upper quadrant abdominal pain and lower chest pain that was severe and nonradiating. It is not associated with nausea or vomiting, diaphoresis, palpitations. Patient had a normal bowel movement this morning. He denies fever. He is shortness of breath he states due to the pain. Patient states that he has history of GERD and takes Nexium daily for this however this is like his typical symptoms. Patient drinks 5-6 beers per night. He denies heavy NSAID use. No history of hypertension, cholesterol, diabetes, smoking. No history of previous heart problems. No history of abdominal surgeries. Onset was acute. Course is constant. Sitting up improves symptoms while lying flat seems to make them worse.   The history is provided by the patient.    Past Medical History  Diagnosis Date  . Atrial fibrillation   . Anxiety   . Depression   . GERD (gastroesophageal reflux disease)   . Gallstones     Past Surgical History  Procedure Date  . Hand surgery   . Tonsillectomy   . Wisdom tooth extraction     Family History  Problem Relation Age of Onset  . Adopted: Yes    History  Substance Use Topics  . Smoking status: Former Smoker    Quit date: 07/23/2006  . Smokeless tobacco: Never Used  . Alcohol Use: Yes     Comment: daily      Review of Systems  Constitutional: Negative for fever.  HENT: Negative for sore throat and rhinorrhea.   Eyes: Negative for redness.  Respiratory: Positive for shortness of breath. Negative for cough.   Cardiovascular: Positive for chest pain.  Gastrointestinal: Positive for abdominal pain. Negative for nausea,  vomiting and diarrhea.  Genitourinary: Negative for dysuria.  Musculoskeletal: Negative for myalgias.  Skin: Negative for rash.  Neurological: Negative for headaches.    Allergies  Penicillins  Home Medications   Current Outpatient Rx  Name  Route  Sig  Dispense  Refill  . ALBUTEROL SULFATE HFA 108 (90 BASE) MCG/ACT IN AERS   Inhalation   Inhale 2 puffs into the lungs every 6 (six) hours as needed for wheezing.   1 Inhaler   1   . CITALOPRAM HYDROBROMIDE 10 MG PO TABS   Oral   Take 10 mg by mouth daily.          Marland Kitchen CLORAZEPATE DIPOTASSIUM 7.5 MG PO TABS   Oral   Take 7.5 mg by mouth at bedtime.          Marland Kitchen ESOMEPRAZOLE MAGNESIUM 40 MG PO CPDR   Oral   Take 40 mg by mouth 2 (two) times daily.         Marland Kitchen METOPROLOL SUCCINATE ER 25 MG PO TB24   Oral   Take 25 mg by mouth daily.         Marland Kitchen VITAMIN B-12 1000 MCG PO TABS   Oral   Take 1,000 mcg by mouth daily.           Marland Kitchen VITAMIN C 500 MG PO TABS   Oral   Take 500 mg by mouth daily.             BP 153/85  Pulse 58  Temp 97.5 F (36.4 C) (Oral)  Resp 18  SpO2 100%  Physical Exam  Nursing note and vitals reviewed. Constitutional: He appears well-developed and well-nourished.  HENT:  Head: Normocephalic and atraumatic.  Eyes: Conjunctivae normal are normal. Right eye exhibits no discharge. Left eye exhibits no discharge.  Neck: Normal range of motion. Neck supple.  Cardiovascular: Normal rate, regular rhythm and normal heart sounds.   Pulmonary/Chest: Effort normal and breath sounds normal. He has no wheezes. He has no rales.  Abdominal: Soft. Bowel sounds are decreased. There is tenderness (significant tenderness) in the right upper quadrant, epigastric area, periumbilical area and left upper quadrant. There is no rigidity, no rebound, no guarding, no CVA tenderness, no tenderness at McBurney's point and negative Murphy's sign.  Neurological: He is alert.  Skin: Skin is warm and dry.  Psychiatric: He  has a normal mood and affect.    ED Course  Procedures (including critical care time)  Labs Reviewed  CBC WITH DIFFERENTIAL - Abnormal; Notable for the following:    WBC 11.4 (*)     Neutro Abs 8.5 (*)     All other components within normal limits  COMPREHENSIVE METABOLIC PANEL - Abnormal; Notable for the following:    Sodium 134 (*)     Glucose, Bld 143 (*)     All other components within normal limits  URINALYSIS, MICROSCOPIC ONLY - Abnormal; Notable for the following:    APPearance CLOUDY (*)     Hgb urine dipstick SMALL (*)     All other components within normal limits  LIPASE, BLOOD  POCT I-STAT TROPONIN I  LACTIC ACID, PLASMA   US Abdomen Complete  06/27/2012  *RADIOLOGY REPORT*  Clinical Data:  Upper abdominal pain.  History of gallstones.  COMPLETE ABDOMINAL ULTRASOUND  Comparison:  Ultrasound 08/08/2011, CT 08/22/2006  Findings:  Gallbladder:  There are multiple mobile gallstones within the gallbladder with posterior shadowing.  Stones measure up to 1.5 cm in number approximately 6 to8.  There is no pericholecystic fluid or  gallbladder wall thickening.  Negative sonographic Murphy's sign.  Common bile duct:  Normal at 5 mm  Liver:  Liver is diffusely increased in echogenicity with no ductal dilatation.  IVC:  Appears normal.  Pancreas:  No focal abnormality seen.  Spleen:  Normal in size and echogenicity  Right Kidney:  12.9 cm in length.  No evidence of hydronephrosis or stones.  Left Kidney:  12.3cm in length.  No evidence of hydronephrosis or stones.  Abdominal aorta:  No aneurysm identified.  IMPRESSION: 1.  Multiple gallstones without evidence of cholecystitis. 2.  Normal common bile duct. 3. Mild increased liver echogenicity commonly represents hepatic steatosis.   Original Report Authenticated By: Genevive Bi, M.D.      1. Abdominal pain     9:12 AM Patient seen and examined. Work-up initiated. Medications ordered. Note metoprolol use.   Vital signs reviewed and  are as follows: Filed Vitals:   06/27/12 0849  BP: 153/85  Pulse: 58  Temp:   Resp: 18    Date: 06/27/2012  Rate: 61  Rhythm: normal sinus rhythm  QRS Axis: normal  Intervals: normal  ST/T Wave abnormalities: nonspecific T wave changes  Conduction Disutrbances:none  Narrative Interpretation:   Old EKG Reviewed: changes noted from 07/21/2011  10:04 AM I-stat troponin did not cross over -- result was 0.00.   Patient and work-up discussed with Dr. Silverio Lay who has seen patient. Will continue with pain control.  Patient continues to have significant tenderness. Surgical consult requested.   Surgical PA has seen in ED. They feel symptoms may be gastritis given epigastric location. I have ordered protonix.   2:32 PM Patient continues to have significant tenderness without vomiting. D/w Dr. Silverio Lay. CT abd/pelvic with contrast ordered to eval for other etiology.    3:27 PM Lactate nml.   3:35 PM Patient re-examined. Pain is down to 3/10. Awaiting CT. Handoff to Dr. Weldon Inches. Plan: dispo per CT results. If neg, d/c home with pain control, surgery f/u (gallbladder), GI f/u.    MDM  Pending CT for evaluation of abdominal pain. Surgery has seen.         Renne Crigler, [place] 06/27/12 1537

## 2012-06-27 NOTE — ED Notes (Signed)
RN to obtain labs with start of IV 

## 2012-06-27 NOTE — Consult Note (Signed)
Reason for Consult: Abdominal Pain and Cholelithiasis Referring Physician: Estevan Oaks PA-C (ER)  Harold Scott is an 51 y.o. male.  HPI: Patient's a 51 year old gentleman in good health. He awoke this morning around 4 AM with significant mid epigastric abdominal pain he was unable to get comfortable and got up around 5 AM. He is tried walking around, ginger and ginger ale. He tried eating some toast around 6 was unable to because he felt so bad. He actually tried going to work to [employer], he works as a [employer] manager. Pain became progressively worse and he ultimately presented to the emergency room and [facility]. The walking did not make the pain worse in fact lying down made it more uncomfortable. He does not have a history of prior biliary colic-like pain. He was seen in December 2012 by Dr. Russella Dar for GERD. He's been on Nexium and his symptoms are much improved. Her current symptoms do not feel like his normal GERD symptoms. The pain remains in the midepigastric area. It is not made worse by palpation. Workup in the ER, shows a WBC of 11.4. CMP is normal. No LFT elevations. Lipase is 27. Urinalysis was cloudy with 0-2 white cells and red cells per high-powered field. Abdominal ultrasound shows multiple mobile gallstones within the gallbladder. Stones measure up to 1.5 cm. There is no pericholecystic fluid or gallbladder wall thickening. Negative Murphy sign. Common bile duct is normal at 5 mm. Liver shows increased at good  the with no ductal dilatation. He does have a history of alcohol use, approximately a case per week of beer. We were asked to see in consultation for acute cholecystitis.  Past Medical History  Diagnosis Date  . Atrial fibrillation 1 episode in the past on BB   . Anxiety   . Depression   . GERD (gastroesophageal reflux disease)   . Gallstones Tobacco use ETOH use  BMI 37     Past Surgical History  Procedure Date  . Hand surgery   . Tonsillectomy   . Wisdom tooth  extraction     Family History  Problem Relation Age of Onset  . Adopted: Yes    Social History:  reports that he quit smoking about 5 years ago. He has never used smokeless tobacco. He reports that he drinks alcohol. He reports that he does not use illicit drugs.TOBACCO: 1PPD 28 years, quit 6 years ago  Etoh- case of beer per week Drugs-none  Married Works as a [employer] manager  Allergies:  Allergies  Allergen Reactions  . Penicillins Hives    REACTION: hives    Medications:  Prior to Admission:  (Not in a hospital admission) Scheduled:   . fentaNYL  50 mcg Intravenous Once  . [COMPLETED] gi cocktail  30 mL Oral Once  . [COMPLETED]  HYDROmorphone (DILAUDID) injection  1 mg Intravenous Once  . [COMPLETED]  HYDROmorphone (DILAUDID) injection  1 mg Intravenous Once  . [COMPLETED]  HYDROmorphone (DILAUDID) injection  2 mg Intravenous Once  . [COMPLETED] ondansetron (ZOFRAN) IV  4 mg Intravenous Once  . [COMPLETED] pantoprazole (PROTONIX) IV  40 mg Intravenous Once   Continuous:   . [COMPLETED] sodium chloride Stopped (06/27/12 1323)   PRN:  Results for orders placed during the hospital encounter of 06/27/12 (from the past 48 hour(s))  CBC WITH DIFFERENTIAL     Status: Abnormal   Collection Time   06/27/12  9:14 AM      Component Value Range Comment   WBC 11.4 (*) 4.0 -  10.5 K/uL    RBC 4.96  4.22 - 5.81 MIL/uL    Hemoglobin 15.1  13.0 - 17.0 g/dL    HCT 45.4  09.8 - 11.9 %    MCV 87.9  78.0 - 100.0 fL    MCH 30.4  26.0 - 34.0 pg    MCHC 34.6  30.0 - 36.0 g/dL    RDW 14.7  82.9 - 56.2 %    Platelets 282  150 - 400 K/uL    Neutrophils Relative 74  43 - 77 %    Neutro Abs 8.5 (*) 1.7 - 7.7 K/uL    Lymphocytes Relative 18  12 - 46 %    Lymphs Abs 2.0  0.7 - 4.0 K/uL    Monocytes Relative 7  3 - 12 %    Monocytes Absolute 0.8  0.1 - 1.0 K/uL    Eosinophils Relative 0  0 - 5 %    Eosinophils Absolute 0.1  0.0 - 0.7 K/uL    Basophils Relative 0  0 - 1 %    Basophils Absolute  0.0  0.0 - 0.1 K/uL   LIPASE, BLOOD     Status: Normal   Collection Time   06/27/12  9:14 AM      Component Value Range Comment   Lipase 27  11 - 59 U/L   COMPREHENSIVE METABOLIC PANEL     Status: Abnormal   Collection Time   06/27/12  9:14 AM      Component Value Range Comment   Sodium 134 (*) 135 - 145 mEq/L    Potassium 4.1  3.5 - 5.1 mEq/L    Chloride 97  96 - 112 mEq/L    CO2 25  19 - 32 mEq/L    Glucose, Bld 143 (*) 70 - 99 mg/dL    BUN 10  6 - 23 mg/dL    Creatinine, Ser 1.30  0.50 - 1.35 mg/dL    Calcium 9.4  8.4 - 86.5 mg/dL    Total Protein 8.0  6.0 - 8.3 g/dL    Albumin 4.3  3.5 - 5.2 g/dL    AST 30  0 - 37 U/L    ALT 34  0 - 53 U/L    Alkaline Phosphatase 88  39 - 117 U/L    Total Bilirubin 0.6  0.3 - 1.2 mg/dL    GFR calc non Af Amer >90  >90 mL/min    GFR calc Af Amer >90  >90 mL/min   URINALYSIS, MICROSCOPIC ONLY     Status: Abnormal   Collection Time   06/27/12  9:19 AM      Component Value Range Comment   Color, Urine YELLOW  YELLOW    APPearance CLOUDY (*) CLEAR    Specific Gravity, Urine 1.023  1.005 - 1.030    pH 5.5  5.0 - 8.0    Glucose, UA NEGATIVE  NEGATIVE mg/dL    Hgb urine dipstick SMALL (*) NEGATIVE    Bilirubin Urine NEGATIVE  NEGATIVE    Ketones, ur NEGATIVE  NEGATIVE mg/dL    Protein, ur NEGATIVE  NEGATIVE mg/dL    Urobilinogen, UA 0.2  0.0 - 1.0 mg/dL    Nitrite NEGATIVE  NEGATIVE    Leukocytes, UA NEGATIVE  NEGATIVE    WBC, UA 0-2  <3 WBC/hpf    RBC / HPF 0-2  <3 RBC/hpf    Bacteria, UA RARE  RARE    Squamous Epithelial / LPF RARE  RARE  US Abdomen Complete  06/27/2012  *RADIOLOGY REPORT*  Clinical Data:  Upper abdominal pain.  History of gallstones.  COMPLETE ABDOMINAL ULTRASOUND  Comparison:  Ultrasound 08/08/2011, CT 08/22/2006  Findings:  Gallbladder:  There are multiple mobile gallstones within the gallbladder with posterior shadowing.  Stones measure up to 1.5 cm in number approximately 6 to8.  There is no pericholecystic fluid  or  gallbladder wall thickening.  Negative sonographic Murphy's sign.  Common bile duct:  Normal at 5 mm  Liver:  Liver is diffusely increased in echogenicity with no ductal dilatation.  IVC:  Appears normal.  Pancreas:  No focal abnormality seen.  Spleen:  Normal in size and echogenicity  Right Kidney:  12.9 cm in length.  No evidence of hydronephrosis or stones.  Left Kidney:  12.3cm in length.  No evidence of hydronephrosis or stones.  Abdominal aorta:  No aneurysm identified.  IMPRESSION: 1.  Multiple gallstones without evidence of cholecystitis. 2.  Normal common bile duct. 3. Mild increased liver echogenicity commonly represents hepatic steatosis.   Original Report Authenticated By: Genevive Bi, M.D.     Review of Systems  Constitutional: Negative.   HENT: Negative.   Eyes: Negative.   Respiratory: Negative.   Cardiovascular: Negative.   Gastrointestinal: Positive for heartburn and abdominal pain (mid epigastric). Negative for nausea, vomiting, diarrhea, constipation, blood in stool and melena.  Genitourinary: Negative.   Musculoskeletal: Negative.   Skin: Negative.   Neurological: Negative.   Endo/Heme/Allergies: Negative.   Psychiatric/Behavioral: Negative.    Blood pressure 141/79, pulse 64, temperature 97.5 F (36.4 C), temperature source Oral, resp. rate 16, SpO2 95.00%. Physical Exam  Constitutional: He is oriented to person, place, and time. He appears well-developed and well-nourished.  HENT:  Head: Normocephalic and atraumatic.  Nose: Nose normal.  Eyes: Conjunctivae normal and EOM are normal. Pupils are equal, round, and reactive to light. Right eye exhibits no discharge. Left eye exhibits no discharge. No scleral icterus.  Neck: Normal range of motion. Neck supple. No JVD present. No tracheal deviation present. No thyromegaly present.  Cardiovascular: Normal rate, regular rhythm, normal heart sounds and intact distal pulses.  Exam reveals no gallop.   No murmur  heard. Respiratory: Effort normal and breath sounds normal. No stridor. No respiratory distress. He has no wheezes. He has no rales. He exhibits no tenderness.  GI: Soft. Bowel sounds are normal. He exhibits no distension and no mass. There is no tenderness. There is no rebound and no guarding.  Musculoskeletal: He exhibits no edema and no tenderness.  Lymphadenopathy:    He has no cervical adenopathy.  Neurological: He is alert and oriented to person, place, and time. No cranial nerve deficit.  Skin: Skin is warm and dry. No erythema.  Psychiatric: He has a normal mood and affect. His behavior is normal. Judgment and thought content normal.    Assessment/Plan: 1. Abdominal pain with cholelithiasis 2.GERD 3.ETOH use and hepatic steatosis 4.Atrial fibrillation on BB 5.Anxiety/depression 6.Hx of tobacco use 7.BMI 37  Plan:  Dr.Gerkin will review.  Not really consistent with biliary colic or acute cholecystitis.  GI ask to see. And we will follow with you. WillJennings PA-C for Dr. Gerrit Friends.  Dymin Dingledine 06/27/2012, 1:15 PM

## 2012-07-10 ENCOUNTER — Encounter (INDEPENDENT_AMBULATORY_CARE_PROVIDER_SITE_OTHER): Payer: Self-pay | Admitting: General Surgery

## 2012-07-10 ENCOUNTER — Ambulatory Visit (INDEPENDENT_AMBULATORY_CARE_PROVIDER_SITE_OTHER): Payer: PRIVATE HEALTH INSURANCE | Admitting: General Surgery

## 2012-07-10 VITALS — BP 122/82 | HR 74 | Temp 98.5°F | Resp 12 | Ht 68.0 in | Wt 252.6 lb

## 2012-07-10 DIAGNOSIS — K8020 Calculus of gallbladder without cholecystitis without obstruction: Secondary | ICD-10-CM

## 2012-07-10 NOTE — Patient Instructions (Signed)
Our office will contact you to schedule surgery. If you do not hear from our office by Monday afternoon, please call us [phone]

## 2012-07-11 NOTE — Progress Notes (Signed)
Patient ID: Harold Scott, male   DOB: 05/17/1961, 51 y.o.   MRN: [id number]  Chief Complaint  Patient presents with  . Abdominal Pain    HPI Harold Scott is a 51 y.o. male.   HPI 51-year-old Caucasian male comes in to discuss gallstone disease. He was seen in the emergency room on November 8 by Dr. Gerkin and will for evaluation for cholelithiasis. The patient states on November 8 he awoke at 4 AM with a severe stomachache. It didn't get much better. He states that it felt like the worse case of gas he ever had. He states however that he went on to work but at around 8 AM he couldn't tolerate the pain anymore which prompted him to go to the [place] emergency department. He states that he felt very bloated. He was in the ER for quite a long time. His workup included an abdominal ultrasound, labs, and ultimately a CT scan. He states that he had a lot of pressure in his epigastric region. Since there is no evidence of acute cholecystitis and his pain improved he was discharged home. He states that he has had an odd feeling in his right upper abdomen for several months. It has been intermittent however over the past several days it is been more of a constant feeling. He describes it as an odd feeling. He states  that this feels like someone is poking him in that spot. He has noticed that certain foods like tomato-based foods increase the discomfort. He did have an episode of nausea and vomiting in the emergency room. He has a history of gastroesophageal reflux disease. He states that this is different. He has had some loose stools intermittently over the past week. He denies any fever, chills, NSAID use, melena, hematochezia. He denies any difficulty swallowing foods. Past Medical History  Diagnosis Date  . Atrial fibrillation   . Anxiety   . Depression   . GERD (gastroesophageal reflux disease)   . Gallstones   . Asthma     Past Surgical History  Procedure Date  . Hand surgery   .  Tonsillectomy   . Wisdom tooth extraction     Family History  Problem Relation Age of Onset  . Adopted: Yes    Social History History  Substance Use Topics  . Smoking status: Former Smoker    Quit date: 07/23/2006  . Smokeless tobacco: Never Used  . Alcohol Use: Yes     Comment: daily    Allergies  Allergen Reactions  . Penicillins Hives    REACTION: hives    Current Outpatient Prescriptions  Medication Sig Dispense Refill  . albuterol (PROVENTIL HFA;VENTOLIN HFA) 108 (90 BASE) MCG/ACT inhaler Inhale 2 puffs into the lungs every 6 (six) hours as needed for wheezing.  1 Inhaler  1  . citalopram (CELEXA) 10 MG tablet Take 10 mg by mouth daily.       . clorazepate (TRANXENE) 7.5 MG tablet Take 7.5 mg by mouth at bedtime.       . esomeprazole (NEXIUM) 40 MG capsule Take 40 mg by mouth 2 (two) times daily.      . metoprolol succinate (TOPROL-XL) 25 MG 24 hr tablet Take 25 mg by mouth daily.      . vitamin B-12 (CYANOCOBALAMIN) 1000 MCG tablet Take 1,000 mcg by mouth daily.        . vitamin C (ASCORBIC ACID) 500 MG tablet Take 500 mg by mouth daily.        .   HYDROmorphone (DILAUDID) 4 MG tablet Take 1 tablet (4 mg total) by mouth every 4 (four) hours as needed for pain.  20 tablet  0    Review of Systems Review of Systems  Constitutional: Negative for fever, chills, appetite change and unexpected weight change.  HENT: Negative for congestion and trouble swallowing.   Eyes: Negative for visual disturbance.  Respiratory: Negative for chest tightness and shortness of breath.   Cardiovascular: Negative for chest pain and leg swelling.       No PND, no orthopnea, no DOE; h/o parx Afib- last episode in Jan; states he has had a stress test in the past.   Gastrointestinal:       See HPI  Genitourinary: Negative for dysuria and hematuria.  Musculoskeletal: Negative.   Skin: Negative for rash.  Neurological: Negative for seizures and speech difficulty.       No TIAs, amaurosis  fugax  Hematological: Does not bruise/bleed easily.  Psychiatric/Behavioral: Negative for behavioral problems and confusion.    Blood pressure 122/82, pulse 74, temperature 98.5 F (36.9 C), resp. rate 12, height 5' 8" (1.727 m), weight 252 lb 9.6 oz (114.579 kg).  Physical Exam Physical Exam  Vitals reviewed. Constitutional: He is oriented to person, place, and time. He appears well-developed. No distress.       obese  HENT:  Head: Normocephalic and atraumatic.  Right Ear: External ear normal.  Left Ear: External ear normal.  Eyes: Conjunctivae normal are normal. No scleral icterus.  Neck: Normal range of motion. Neck supple. No tracheal deviation present.  Cardiovascular: Normal rate, regular rhythm and normal heart sounds.   Pulmonary/Chest: Effort normal and breath sounds normal. No stridor. No respiratory distress. He has no wheezes.  Abdominal: Soft. He exhibits no distension. There is tenderness in the right upper quadrant. There is no rebound and no guarding.       Mild TTP in ruq  Musculoskeletal: Normal range of motion. He exhibits no edema and no tenderness.  Lymphadenopathy:    He has no cervical adenopathy.  Neurological: He is alert and oriented to person, place, and time. He exhibits normal muscle tone.  Skin: Skin is warm and dry. No rash noted. He is not diaphoretic. No erythema.  Psychiatric: He has a normal mood and affect. His behavior is normal. Judgment and thought content normal.    Data Reviewed Dr Gerkin's and Will Jennings' note from 11/8 ED provider note 11/8 Labs 11/8  CT ABDOMEN AND PELVIS WITH CONTRAST  Technique: Multidetector CT imaging of the abdomen and pelvis was  performed following the standard protocol during bolus  administration of intravenous contrast.  Contrast: 100mL OMNIPAQUE IOHEXOL 300 MG/ML SOLN  Comparison: Ultrasound, 06/27/2012. CT, 08/22/2006  Findings: The cardiac silhouette is normal in size. There is minor  subsegmental  atelectasis at the lung bases. Lung bases are  otherwise clear.  There is mild fatty infiltration of the liver. The liver is  otherwise unremarkable. Normal spleen. Multiple gallstones are  noted within an otherwise unremarkable gallbladder. No  pericholecystic inflammation is seen to suggest acute  cholecystitis. No bile duct dilation. The pancreas is within  normal limits. No adrenal masses. Normal kidneys, ureters and  bladder. No adenopathy. No abnormal fluid collections. Normal  bowel. Normal appendix. There are degenerative changes of the  visualized spine most prominent at L4-L5. 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 No osteoblastic or osteolytic lesions.  IMPRESSION:  No acute findings.  CT also demonstrates gallstones but   no evidence of acute  cholecystitis.  Mild fatty infiltration of the liver.  Degenerative changes of the visualized spine with chronic bilateral  pars defects at L5-S1.  COMPLETE ABDOMINAL ULTRASOUND  Comparison: Ultrasound 08/08/2011, CT 08/22/2006  Findings:  Gallbladder: There are multiple mobile gallstones within the  gallbladder with posterior shadowing. Stones measure up to 1.5 cm  in number approximately 6 to8. There is no pericholecystic fluid  or gallbladder wall thickening. Negative sonographic Murphy's  sign.  Common bile duct: Normal at 5 mm  Liver: Liver is diffusely increased in echogenicity with no ductal  dilatation.  IVC: Appears normal.  Pancreas: No focal abnormality seen.  Spleen: Normal in size and echogenicity  Right Kidney: 12.9 cm in length. No evidence of hydronephrosis or  stones.  Left Kidney: 12.3cm in length. No evidence of hydronephrosis or  stones.  Abdominal aorta: No aneurysm identified.  IMPRESSION:  1. Multiple gallstones without evidence of cholecystitis.  2. Normal common bile duct.  3. Mild increased liver echogenicity commonly represents hepatic  steatosis.   Assessment    Symptomatic  cholelithiasis GERD    Plan    I believe the patient's most recent symptoms are consistent with gallbladder disease.  We discussed gallbladder disease. The patient was given educational material. We discussed non-operative and operative management. We discussed the signs & symptoms of acute cholecystitis  I discussed laparoscopic cholecystectomy with IOC in detail.  The patient was given educational material as well as diagrams detailing the procedure.  We discussed the risks and benefits of a laparoscopic cholecystectomy including, but not limited to bleeding, infection, injury to surrounding structures such as the intestine or liver, bile leak, retained gallstones, need to convert to an open procedure, prolonged diarrhea, blood clots such as  DVT, common bile duct injury, anesthesia risks, and possible need for additional procedures.  We discussed the typical post-operative recovery course. I explained that the likelihood of improvement of their symptoms is good.  I did explain that some his symptoms are probably related to GERD and will not improve with cholecystectomy  The patient has elected to proceed with LAPAROSCOPIC CHOLECYSTECTOMY with IOC  Nakai Pollio M. Mialee Weyman, MD, FACS General, Bariatric, & Minimally Invasive Surgery [facility]        Nana Hoselton M 07/11/2012, 10:10 AM    

## 2012-07-25 ENCOUNTER — Other Ambulatory Visit: Payer: Self-pay | Admitting: Family Medicine

## 2012-07-28 ENCOUNTER — Ambulatory Visit (HOSPITAL_COMMUNITY)
Admission: RE | Admit: 2012-07-28 | Discharge: 2012-07-28 | Disposition: A | Discharge status: Home / Self Care | Payer: PRIVATE HEALTH INSURANCE | Source: Ambulatory Visit | Attending: General Surgery | Admitting: General Surgery

## 2012-07-28 ENCOUNTER — Encounter (HOSPITAL_COMMUNITY): Payer: Self-pay

## 2012-07-28 ENCOUNTER — Encounter (HOSPITAL_COMMUNITY): Payer: Self-pay | Admitting: Pharmacy Technician

## 2012-07-28 ENCOUNTER — Encounter (HOSPITAL_COMMUNITY)
Admission: RE | Admit: 2012-07-28 | Discharge: 2012-07-28 | Disposition: A | Discharge status: Home / Self Care | Payer: PRIVATE HEALTH INSURANCE | Source: Ambulatory Visit | Attending: General Surgery | Admitting: General Surgery

## 2012-07-28 DIAGNOSIS — Z01818 Encounter for other preprocedural examination: Secondary | ICD-10-CM | POA: Insufficient documentation

## 2012-07-28 LAB — CBC WITH DIFFERENTIAL/PLATELET
Eosinophils Absolute: 0.2 10*3/uL (ref 0.0–0.7)
Eosinophils Relative: 3 % (ref 0–5)
Hemoglobin: 13.1 g/dL (ref 13.0–17.0)
Lymphs Abs: 2.9 10*3/uL (ref 0.7–4.0)
MCH: 29.8 pg (ref 26.0–34.0)
MCV: 88.8 fL (ref 78.0–100.0)
Monocytes Relative: 10 % (ref 3–12)
Neutrophils Relative %: 56 % (ref 43–77)
RBC: 4.39 MIL/uL (ref 4.22–5.81)

## 2012-07-28 LAB — COMPREHENSIVE METABOLIC PANEL
Alkaline Phosphatase: 81 U/L (ref 39–117)
BUN: 9 mg/dL (ref 6–23)
GFR calc Af Amer: >90 mL/min (ref >90)
GFR calc non Af Amer: >90 mL/min (ref >90)
Glucose, Bld: 88 mg/dL (ref 70–99)
Potassium: 3.9 mEq/L (ref 3.5–5.1)
Total Protein: 6.7 g/dL (ref 6.0–8.3)

## 2012-07-28 LAB — SURGICAL PCR SCREEN
MRSA, PCR: NEGATIVE
Staphylococcus aureus: NEGATIVE

## 2012-07-28 NOTE — Pre-Procedure Instructions (Signed)
20 Harold Scott  07/28/2012   Your procedure is scheduled on:  08/05/12  Report to [facility] at 1130 AM.  Call this number if you have problems the morning of surgery: [phone]   Remember:   Do not eat food:After Midnight.    Take these medicines the morning of surgery with A SIP OF WATER: *all inhalers,celexa,tranxene,nexium,didaudid,toprol   Do not wear jewelry, make-up or nail polish.  Do not wear lotions, powders, or perfumes. You may wear deodorant.  Do not shave 48 hours prior to surgery. Men may shave face and neck.  Do not bring valuables to the hospital.  Contacts, dentures or bridgework may not be worn into surgery.  Leave suitcase in the car. After surgery it may be brought to your room.  For patients admitted to the hospital, checkout time is 11:00 AM the day of discharge.   Patients discharged the day of surgery will not be allowed to drive home.  Name and phone number of your driver: family  Special Instructions: Shower using CHG 2 nights before surgery and the night before surgery.  If you shower the day of surgery use CHG.  Use special wash - you have one bottle of CHG for all showers.  You should use approximately 1/3 of the bottle for each shower.   Please read over the following fact sheets that you were given: Pain Booklet, Coughing and Deep Breathing, MRSA Information and Surgical Site Infection Prevention

## 2012-07-29 ENCOUNTER — Telehealth (INDEPENDENT_AMBULATORY_CARE_PROVIDER_SITE_OTHER): Payer: Self-pay

## 2012-07-29 NOTE — Telephone Encounter (Signed)
Lesly Rubenstein - can you get him a appt with his cardiologist ASAP so we won't have to cancel his surgery. thanks

## 2012-07-29 NOTE — Telephone Encounter (Signed)
Patient made aware. He was very upset and did not want to go to appt. He didn't understand why he needed it. I advised it was requested by cardiology. He said he would keep appt.

## 2012-07-29 NOTE — Telephone Encounter (Signed)
Hospital called to request cardiac clearance for this patient.  Anesthesia ordered a holter monitor, and they would like an appt scheduled with [facility] for clearance.  His surgery is scheduled for 08/05/12.

## 2012-07-29 NOTE — Telephone Encounter (Addendum)
Appt made with Dr Eden Emms - arrive at 9:15 am for 9:30 am tomorrow 07/30/12.

## 2012-07-29 NOTE — Consult Note (Addendum)
Anesthesia chart review: Patient is a 50 year old male scheduled for laparoscopic cholecystectomy by Dr. Andrey Campanile on 08/05/2012. History includes former smoker, obesity, postoperative nausea and vomiting, anxiety, depression, GERD, asthma.  He presented to the ED on 07/21/11 with complaints of chest pain and was found to be in atrial fibrillation with RVR which responded to IV Cardizem.  He was started on b-blocker therapy and discharged home with PCP follow-up with Dr. Kelle Darting.  According to notes, he planned on referring patient for cardiac evaluation following GI evaluation for question of gallbladder disease and reflux esophagitis.  He remains on b-blocker therapy (Toprol XL 25 mg daily).  Holter monitor in April 2013 showed NSR, occasional SB.  EKG on 06/27/12 showed NSR, with resolution of atrial fibrillation since his prior EKG on 07/21/2011.  Chest x-ray on 07/28/2012 showed no acute cardiopulmonary disease.  Preoperative labs noted.  It appears he never underwent cardiology evaluation following his episode of chest pain and rapid afib last year.  He has maintained NSR on b-blocker therapy.  I reviewed with Anesthesiologist Dr. Michelle Piper.  Since patient does have a history of chest pain and afib without formal cardiology evaluation, anesthesia recommends preoperative cardiology evaluation to ensure no additional testing is recommended prior to surgery.  I have notified triage nursing staff at [facility].  Shonna Chock, PA-C 07/29/12 1335  Addendum: 07/30/12 1625 Patient was seen by Dr. Eden Emms earlier today.  He ordered an echo (see below).  He plans to see patient post-operatively to discuss further w/u with event monitor and possible flecainide.  Echo on 07/30/12 showed: - Left ventricle: The cavity size was normal. Wall thickness was increased in a pattern of moderate LVH. There was focal basal hypertrophy. Systolic function was normal. The estimated ejection fraction was in the range of 60%  to 65%. - Left atrium: The atrium was moderately to severely dilated. - Aorta: Ascending aorta was mildly dilated.   Echo was reviewed by Dr. Eden Emms.  Per his comments, "Patient in afib. Ok to have gallbladder out. Will discuss anticoagulation and possible [facility] after surgery. Should have telemetry during surgery and post op. Toprol increased to 50 mg. With mod to severe LAE may be hard to convert."

## 2012-07-30 ENCOUNTER — Ambulatory Visit (HOSPITAL_COMMUNITY): Payer: PRIVATE HEALTH INSURANCE | Attending: Cardiovascular Disease | Admitting: Radiology

## 2012-07-30 ENCOUNTER — Ambulatory Visit (INDEPENDENT_AMBULATORY_CARE_PROVIDER_SITE_OTHER): Payer: PRIVATE HEALTH INSURANCE | Admitting: Cardiovascular Disease

## 2012-07-30 VITALS — BP 148/101 | HR 96 | Resp 18 | Ht 68.0 in | Wt 253.0 lb

## 2012-07-30 DIAGNOSIS — I480 Paroxysmal atrial fibrillation: Secondary | ICD-10-CM

## 2012-07-30 DIAGNOSIS — E785 Hyperlipidemia, unspecified: Secondary | ICD-10-CM | POA: Insufficient documentation

## 2012-07-30 DIAGNOSIS — I4891 Unspecified atrial fibrillation: Secondary | ICD-10-CM

## 2012-07-30 DIAGNOSIS — F411 Generalized anxiety disorder: Secondary | ICD-10-CM | POA: Insufficient documentation

## 2012-07-30 DIAGNOSIS — Z87891 Personal history of nicotine dependence: Secondary | ICD-10-CM | POA: Insufficient documentation

## 2012-07-30 DIAGNOSIS — K219 Gastro-esophageal reflux disease without esophagitis: Secondary | ICD-10-CM

## 2012-07-30 DIAGNOSIS — I517 Cardiomegaly: Secondary | ICD-10-CM | POA: Insufficient documentation

## 2012-07-30 DIAGNOSIS — R000 Tachycardia, unspecified: Secondary | ICD-10-CM | POA: Insufficient documentation

## 2012-07-30 MED ORDER — METOPROLOL SUCCINATE ER 50 MG PO TB24: 50.0000 mg | ORAL_TABLET | Freq: Every day | ORAL | Status: DC

## 2012-07-30 NOTE — Assessment & Plan Note (Signed)
As needed xanax as this can ppt PAF

## 2012-07-30 NOTE — Progress Notes (Signed)
Patient ID: Harold Scott, male   DOB: [birth year], 51 y.o.   MRN: [id number] 51 yo with episode of PAF last year during ? Panic attack.  Short lived and placed on Toprol by primary with no further w/u/  Over last 6 months has had two attacks of biliary colic. Clearly RUQ/epigastric pain with stones. Needs labpcholy with Dr Andrey Campanile.  No chest pain or dypsnea. Compliant with Toprol.  No syncope or palpitations.  No previous anesthetic issues or bleeding diathesis.  Does tend to have anxiety.  TSH was 11 a year ago  ROS: Denies fever, malais, weight loss, blurry vision, decreased visual acuity, cough, sputum, SOB, hemoptysis, pleuritic pain, palpitaitons, heartburn, abdominal pain, melena, lower extremity edema, claudication, or rash.  All other systems reviewed and negative   General: Affect appropriate Overweight white male HEENT: normal Neck supple with no adenopathy JVP normal no bruits no thyromegaly Lungs clear with no wheezing and good diaphragmatic motion Heart:  S1/S2 no murmur,rub, gallop or click PMI normal Abdomen: benighn, BS positve, no tenderness, no AAA no bruit.  No HSM or HJR Distal pulses intact with no bruits No edema Neuro non-focal Skin warm and dry No muscular weakness  Medications Current Outpatient Prescriptions  Medication Sig Dispense Refill  . albuterol (PROVENTIL HFA;VENTOLIN HFA) 108 (90 BASE) MCG/ACT inhaler Inhale 2 puffs into the lungs every 6 (six) hours as needed. Or shortness of breath      . Ascorbic Acid (VITAMIN C) 1000 MG tablet Take 1,000 mg by mouth daily.      . citalopram (CELEXA) 10 MG tablet Take 10 mg by mouth daily.       . clorazepate (TRANXENE) 7.5 MG tablet Take 7.5 mg by mouth at bedtime.       Marland Kitchen esomeprazole (NEXIUM) 40 MG capsule Take 40 mg by mouth 2 (two) times daily as needed. For acid reflux      . guaiFENesin (MUCINEX) 600 MG 12 hr tablet Take 1,200 mg by mouth 2 (two) times daily as needed. For cough      . ibuprofen  (ADVIL,MOTRIN) 200 MG tablet Take 400 mg by mouth every 6 (six) hours as needed. For pain      . metoprolol succinate (TOPROL-XL) 25 MG 24 hr tablet Take 25 mg by mouth daily.      . vitamin B-12 (CYANOCOBALAMIN) 1000 MCG tablet Take 1,000 mcg by mouth daily.          Allergies Penicillins  Family History: Family History  Problem Relation Age of Onset  . Adopted: Yes    Social History: History   Social History  . Marital Status: Married    Spouse Name: N/A    Number of Children: 1  . Years of Education: N/A   Occupational History  . Courier    Social History Main Topics  . Smoking status: Former Smoker    Quit date: 07/23/2006  . Smokeless tobacco: Never Used  . Alcohol Use: Yes     Comment: daily  . Drug Use: No  . Sexually Active: Not on file   Other Topics Concern  . Not on file   Social History Narrative  . No narrative on file    Electrocardiogram: 06/28/12  SR rate 61 normal  Afib rate 94 otherwise normal  Assessment and Plan

## 2012-07-30 NOTE — Patient Instructions (Addendum)
Your physician has requested that you have an echocardiogram. Echocardiography is a painless test that uses sound waves to create images of your heart. It provides your doctor with information about the size and shape of your heart and how well your heart's chambers and valves are working. This procedure takes approximately one hour. There are no restrictions for this procedure.  Your physician has recommended you make the following change in your medication: increase Metoprolol to 50 mg daily  Your physician recommends that you schedule a follow-up appointment in: 3 weeks

## 2012-07-30 NOTE — Progress Notes (Signed)
Echocardiogram performed.  

## 2012-07-30 NOTE — Assessment & Plan Note (Signed)
Cholesterol is at goal.  Continue current dose of statin and diet Rx.  No myalgias or side effects.  F/U  LFT's in 6 months. Lab Results  Component Value Date   LDLCALC 141* 08/31/2008           '

## 2012-07-30 NOTE — Assessment & Plan Note (Signed)
Cont inue proton pump inhibitor  F/U GI

## 2012-07-30 NOTE — Assessment & Plan Note (Signed)
Clear that patient not able to tell how often he is in afib.  Needs gallbladder out.  [place] score low.  Will increase Toprol to 50 mg.  Ok to have surgery if echo ok today.  Can be done at 10:30  Will see post surgery to discuss further w/u with event monitor and possible flecainide Rx

## 2012-07-31 ENCOUNTER — Telehealth (INDEPENDENT_AMBULATORY_CARE_PROVIDER_SITE_OTHER): Payer: Self-pay | Admitting: General Surgery

## 2012-07-31 ENCOUNTER — Telehealth: Payer: Self-pay | Admitting: Cardiovascular Disease

## 2012-07-31 NOTE — Telephone Encounter (Signed)
Left message for Dr Fabio Bering RN to check on status of clearance. Note stated he would be cleared if normal EKG - patient had abnormal EKG. Awaiting response.

## 2012-07-31 NOTE — Telephone Encounter (Signed)
JADE AWARE PT MAY PROCEED WITH  SURGERY  SEE  COMMENT ON ECHO REPORT .Zack Seal

## 2012-07-31 NOTE — Telephone Encounter (Signed)
Clearance given for surgery per Dr Eden Emms- under "procedures" and "echo".

## 2012-07-31 NOTE — Telephone Encounter (Signed)
Tara at [facility] pre admit made aware that clearance note in EPIC.

## 2012-07-31 NOTE — Telephone Encounter (Signed)
New problem:   Status of cardiac clearance. Surgery on 12/17. Clarification on EKG.  Fax [phone].

## 2012-08-04 MED ORDER — CIPROFLOXACIN IN D5W 400 MG/200ML IV SOLN
400.0000 mg | INTRAVENOUS | Status: AC
  Administered 2012-08-05: 400 mg via INTRAVENOUS
  Filled 2012-08-04: qty 200

## 2012-08-05 ENCOUNTER — Encounter (HOSPITAL_COMMUNITY): Payer: Self-pay | Admitting: Vascular Surgery

## 2012-08-05 ENCOUNTER — Encounter (HOSPITAL_COMMUNITY): Payer: Self-pay | Admitting: *Deleted

## 2012-08-05 ENCOUNTER — Ambulatory Visit (HOSPITAL_COMMUNITY)
Admission: RE | Admit: 2012-08-05 | Discharge: 2012-08-05 | Disposition: A | Discharge status: Home / Self Care | Payer: PRIVATE HEALTH INSURANCE | Source: Ambulatory Visit | Attending: General Surgery | Admitting: General Surgery

## 2012-08-05 ENCOUNTER — Ambulatory Visit (HOSPITAL_COMMUNITY): Payer: PRIVATE HEALTH INSURANCE

## 2012-08-05 ENCOUNTER — Ambulatory Visit (HOSPITAL_COMMUNITY): Payer: PRIVATE HEALTH INSURANCE | Admitting: Vascular Surgery

## 2012-08-05 ENCOUNTER — Encounter (HOSPITAL_COMMUNITY)
Admission: RE | Disposition: A | Discharge status: Home / Self Care | Payer: Self-pay | Source: Ambulatory Visit | Attending: General Surgery

## 2012-08-05 DIAGNOSIS — F411 Generalized anxiety disorder: Secondary | ICD-10-CM | POA: Insufficient documentation

## 2012-08-05 DIAGNOSIS — Z87891 Personal history of nicotine dependence: Secondary | ICD-10-CM | POA: Insufficient documentation

## 2012-08-05 DIAGNOSIS — I4891 Unspecified atrial fibrillation: Secondary | ICD-10-CM | POA: Insufficient documentation

## 2012-08-05 DIAGNOSIS — J45909 Unspecified asthma, uncomplicated: Secondary | ICD-10-CM | POA: Insufficient documentation

## 2012-08-05 DIAGNOSIS — K219 Gastro-esophageal reflux disease without esophagitis: Secondary | ICD-10-CM | POA: Insufficient documentation

## 2012-08-05 DIAGNOSIS — F3289 Other specified depressive episodes: Secondary | ICD-10-CM | POA: Insufficient documentation

## 2012-08-05 DIAGNOSIS — Z880 Allergy status to penicillin: Secondary | ICD-10-CM | POA: Insufficient documentation

## 2012-08-05 DIAGNOSIS — K8010 Calculus of gallbladder with chronic cholecystitis without obstruction: Secondary | ICD-10-CM | POA: Insufficient documentation

## 2012-08-05 DIAGNOSIS — F329 Major depressive disorder, single episode, unspecified: Secondary | ICD-10-CM | POA: Insufficient documentation

## 2012-08-05 HISTORY — PX: CHOLECYSTECTOMY: SHX55

## 2012-08-05 SURGERY — LAPAROSCOPIC CHOLECYSTECTOMY WITH INTRAOPERATIVE CHOLANGIOGRAM
Anesthesia: General | Wound class: Clean Contaminated

## 2012-08-05 MED ORDER — SODIUM CHLORIDE 0.9 % IV SOLN: 250.0000 mL | INTRAVENOUS | Status: DC | PRN

## 2012-08-05 MED ORDER — SODIUM CHLORIDE 0.9 % IJ SOLN: 3.0000 mL | INTRAMUSCULAR | Status: DC | PRN

## 2012-08-05 MED ORDER — HYDROMORPHONE HCL PF 1 MG/ML IJ SOLN
INTRAMUSCULAR | Status: AC
  Filled 2012-08-05: qty 1

## 2012-08-05 MED ORDER — ACETAMINOPHEN 650 MG RE SUPP: 650.0000 mg | RECTAL | Status: DC | PRN

## 2012-08-05 MED ORDER — OXYCODONE HCL 5 MG/5ML PO SOLN: 5.0000 mg | Freq: Once | ORAL | Status: DC | PRN

## 2012-08-05 MED ORDER — ONDANSETRON HCL 4 MG/2ML IJ SOLN
INTRAMUSCULAR | Status: DC | PRN
  Administered 2012-08-05: 4 mg via INTRAVENOUS

## 2012-08-05 MED ORDER — PROPOFOL 10 MG/ML IV BOLUS
INTRAVENOUS | Status: DC | PRN
  Administered 2012-08-05: 200 mg via INTRAVENOUS

## 2012-08-05 MED ORDER — OXYCODONE-ACETAMINOPHEN 5-325 MG PO TABS: 1.0000 | ORAL_TABLET | ORAL | Status: DC | PRN

## 2012-08-05 MED ORDER — 0.9 % SODIUM CHLORIDE (POUR BTL) OPTIME
TOPICAL | Status: DC | PRN
  Administered 2012-08-05: 1000 mL

## 2012-08-05 MED ORDER — SUCCINYLCHOLINE CHLORIDE 20 MG/ML IJ SOLN
INTRAMUSCULAR | Status: DC | PRN
  Administered 2012-08-05: 120 mg via INTRAVENOUS

## 2012-08-05 MED ORDER — MEPERIDINE HCL 25 MG/ML IJ SOLN: 6.2500 mg | INTRAMUSCULAR | Status: DC | PRN

## 2012-08-05 MED ORDER — ACETAMINOPHEN 10 MG/ML IV SOLN
1000.0000 mg | Freq: Once | INTRAVENOUS | Status: AC
  Administered 2012-08-05: 1000 mg via INTRAVENOUS
  Filled 2012-08-05: qty 100

## 2012-08-05 MED ORDER — NEOSTIGMINE METHYLSULFATE 1 MG/ML IJ SOLN
INTRAMUSCULAR | Status: DC | PRN
  Administered 2012-08-05: 1 mg via INTRAVENOUS
  Administered 2012-08-05: 5 mg via INTRAVENOUS

## 2012-08-05 MED ORDER — LACTATED RINGERS IV SOLN
INTRAVENOUS | Status: DC | PRN
  Administered 2012-08-05: 12:00:00 via INTRAVENOUS

## 2012-08-05 MED ORDER — BUPIVACAINE-EPINEPHRINE 0.25% -1:200000 IJ SOLN
INTRAMUSCULAR | Status: DC | PRN
  Administered 2012-08-05: 15 mL

## 2012-08-05 MED ORDER — ACETAMINOPHEN 10 MG/ML IV SOLN
INTRAVENOUS | Status: AC
  Filled 2012-08-05: qty 100

## 2012-08-05 MED ORDER — ACETAMINOPHEN 325 MG PO TABS: 650.0000 mg | ORAL_TABLET | ORAL | Status: DC | PRN

## 2012-08-05 MED ORDER — OXYCODONE HCL 5 MG PO TABS: 5.0000 mg | ORAL_TABLET | Freq: Once | ORAL | Status: DC | PRN

## 2012-08-05 MED ORDER — BUPIVACAINE-EPINEPHRINE PF 0.25-1:200000 % IJ SOLN
INTRAMUSCULAR | Status: AC
  Filled 2012-08-05: qty 30

## 2012-08-05 MED ORDER — SODIUM CHLORIDE 0.9 % IJ SOLN: 3.0000 mL | Freq: Two times a day (BID) | INTRAMUSCULAR | Status: DC

## 2012-08-05 MED ORDER — LACTATED RINGERS IV SOLN
INTRAVENOUS | Status: DC
  Administered 2012-08-05: 12:00:00 via INTRAVENOUS

## 2012-08-05 MED ORDER — SODIUM CHLORIDE 0.9 % IV SOLN
INTRAVENOUS | Status: DC | PRN
  Administered 2012-08-05: 13:00:00 via INTRAVENOUS

## 2012-08-05 MED ORDER — SODIUM CHLORIDE 0.9 % IR SOLN
Status: DC | PRN
  Administered 2012-08-05: 1000 mL

## 2012-08-05 MED ORDER — PROMETHAZINE HCL 25 MG/ML IJ SOLN
INTRAMUSCULAR | Status: AC
  Filled 2012-08-05: qty 1

## 2012-08-05 MED ORDER — FENTANYL CITRATE 0.05 MG/ML IJ SOLN
INTRAMUSCULAR | Status: DC | PRN
  Administered 2012-08-05: 100 ug via INTRAVENOUS
  Administered 2012-08-05: 150 ug via INTRAVENOUS

## 2012-08-05 MED ORDER — SODIUM CHLORIDE 0.9 % IV SOLN
INTRAVENOUS | Status: DC | PRN
  Administered 2012-08-05: 14:00:00

## 2012-08-05 MED ORDER — OXYCODONE HCL 5 MG PO TABS: 5.0000 mg | ORAL_TABLET | ORAL | Status: DC | PRN

## 2012-08-05 MED ORDER — HYDROMORPHONE HCL PF 1 MG/ML IJ SOLN
0.2500 mg | INTRAMUSCULAR | Status: DC | PRN
  Administered 2012-08-05 (×2): 0.25 mg via INTRAVENOUS

## 2012-08-05 MED ORDER — ROCURONIUM BROMIDE 100 MG/10ML IV SOLN
INTRAVENOUS | Status: DC | PRN
  Administered 2012-08-05: 30 mg via INTRAVENOUS
  Administered 2012-08-05: 10 mg via INTRAVENOUS

## 2012-08-05 MED ORDER — ONDANSETRON HCL 4 MG/2ML IJ SOLN: 4.0000 mg | Freq: Four times a day (QID) | INTRAMUSCULAR | Status: DC | PRN

## 2012-08-05 MED ORDER — GLYCOPYRROLATE 0.2 MG/ML IJ SOLN
INTRAMUSCULAR | Status: DC | PRN
  Administered 2012-08-05: 0.2 mg via INTRAVENOUS
  Administered 2012-08-05: .8 mg via INTRAVENOUS

## 2012-08-05 MED ORDER — LIDOCAINE HCL (CARDIAC) 20 MG/ML IV SOLN
INTRAVENOUS | Status: DC | PRN
  Administered 2012-08-05: 70 mg via INTRAVENOUS

## 2012-08-05 MED ORDER — PROMETHAZINE HCL 25 MG/ML IJ SOLN
6.2500 mg | INTRAMUSCULAR | Status: DC | PRN
  Administered 2012-08-05: 6.25 mg via INTRAVENOUS

## 2012-08-05 MED ORDER — MORPHINE SULFATE 2 MG/ML IJ SOLN: 1.0000 mg | INTRAMUSCULAR | Status: DC | PRN

## 2012-08-05 MED ORDER — ARTIFICIAL TEARS OP OINT
TOPICAL_OINTMENT | OPHTHALMIC | Status: DC | PRN
  Administered 2012-08-05: 1 via OPHTHALMIC

## 2012-08-05 MED ORDER — MIDAZOLAM HCL 5 MG/5ML IJ SOLN
INTRAMUSCULAR | Status: DC | PRN
  Administered 2012-08-05: 2 mg via INTRAVENOUS

## 2012-08-05 SURGICAL SUPPLY — 47 items
APPLIER CLIP 5 13 M/L LIGAMAX5 (MISCELLANEOUS) ×2
BAG SPEC RTRVL LRG 6X4 10 (ENDOMECHANICALS) ×1
BANDAGE ADHESIVE 1X3 (GAUZE/BANDAGES/DRESSINGS) ×6 IMPLANT
BENZOIN TINCTURE PRP APPL 2/3 (GAUZE/BANDAGES/DRESSINGS) ×2 IMPLANT
BLADE SURG ROTATE 9660 (MISCELLANEOUS) ×2 IMPLANT
CANISTER SUCTION 2500CC (MISCELLANEOUS) ×2 IMPLANT
CHLORAPREP W/TINT 26ML (MISCELLANEOUS) ×2 IMPLANT
CLIP APPLIE 5 13 M/L LIGAMAX5 (MISCELLANEOUS) ×1 IMPLANT
CLOTH BEACON ORANGE TIMEOUT ST (SAFETY) ×2 IMPLANT
COVER MAYO STAND STRL (DRAPES) ×2 IMPLANT
COVER SURGICAL LIGHT HANDLE (MISCELLANEOUS) ×2 IMPLANT
DECANTER SPIKE VIAL GLASS SM (MISCELLANEOUS) ×2 IMPLANT
DRAPE C-ARM 42X72 X-RAY (DRAPES) ×2 IMPLANT
DRAPE UTILITY 15X26 W/TAPE STR (DRAPE) ×4 IMPLANT
DRSG TEGADERM 4X4.75 (GAUZE/BANDAGES/DRESSINGS) ×2 IMPLANT
ELECT REM PT RETURN 9FT ADLT (ELECTROSURGICAL) ×2
ELECTRODE REM PT RTRN 9FT ADLT (ELECTROSURGICAL) ×1 IMPLANT
GAUZE SPONGE 2X2 8PLY STRL LF (GAUZE/BANDAGES/DRESSINGS) ×1 IMPLANT
GLOVE BIO SURGEON STRL SZ7.5 (GLOVE) ×2 IMPLANT
GLOVE BIOGEL M STRL SZ7.5 (GLOVE) ×2 IMPLANT
GLOVE BIOGEL PI IND STRL 7.0 (GLOVE) ×1 IMPLANT
GLOVE BIOGEL PI IND STRL 7.5 (GLOVE) ×2 IMPLANT
GLOVE BIOGEL PI IND STRL 8 (GLOVE) ×1 IMPLANT
GLOVE BIOGEL PI INDICATOR 7.0 (GLOVE) ×1
GLOVE BIOGEL PI INDICATOR 7.5 (GLOVE) ×2
GLOVE BIOGEL PI INDICATOR 8 (GLOVE) ×1
GLOVE SURG SS PI 7.0 STRL IVOR (GLOVE) ×2 IMPLANT
GOWN PREVENTION PLUS XXLARGE (GOWN DISPOSABLE) ×2 IMPLANT
GOWN STRL NON-REIN LRG LVL3 (GOWN DISPOSABLE) ×4 IMPLANT
KIT BASIN OR (CUSTOM PROCEDURE TRAY) ×2 IMPLANT
KIT ROOM TURNOVER OR (KITS) ×2 IMPLANT
NS IRRIG 1000ML POUR BTL (IV SOLUTION) ×2 IMPLANT
PAD ARMBOARD 7.5X6 YLW CONV (MISCELLANEOUS) ×2 IMPLANT
POUCH SPECIMEN RETRIEVAL 10MM (ENDOMECHANICALS) ×2 IMPLANT
SCISSORS LAP 5X35 DISP (ENDOMECHANICALS) ×2 IMPLANT
SET CHOLANGIOGRAPH 5 50 .035 (SET/KITS/TRAYS/PACK) ×2 IMPLANT
SET IRRIG TUBING LAPAROSCOPIC (IRRIGATION / IRRIGATOR) ×2 IMPLANT
SLEEVE ENDOPATH XCEL 5M (ENDOMECHANICALS) ×4 IMPLANT
SPECIMEN JAR SMALL (MISCELLANEOUS) ×2 IMPLANT
SPONGE GAUZE 2X2 STER 10/PKG (GAUZE/BANDAGES/DRESSINGS) ×1
STRIP CLOSURE SKIN 1/2X4 (GAUZE/BANDAGES/DRESSINGS) ×2 IMPLANT
SUT MNCRL AB 4-0 PS2 18 (SUTURE) ×2 IMPLANT
TOWEL OR 17X24 6PK STRL BLUE (TOWEL DISPOSABLE) ×2 IMPLANT
TOWEL OR 17X26 10 PK STRL BLUE (TOWEL DISPOSABLE) ×2 IMPLANT
TRAY LAPAROSCOPIC (CUSTOM PROCEDURE TRAY) ×2 IMPLANT
TROCAR XCEL BLUNT TIP 100MML (ENDOMECHANICALS) ×2 IMPLANT
TROCAR XCEL NON-BLD 5MMX100MML (ENDOMECHANICALS) ×2 IMPLANT

## 2012-08-05 NOTE — Op Note (Signed)
Laparoscopic Cholecystectomy with IOC Procedure Note  Indications: This patient presents with symptomatic gallbladder disease and will undergo laparoscopic cholecystectomy.  Pre-operative Diagnosis: symptomatic cholelithiasis  Post-operative Diagnosis: Calculus of gallbladder with other cholecystitis, without mention of obstruction  Surgeon: [name]   Assistants: none  Anesthesia: General endotracheal anesthesia  ASA Class: 2  Procedure Details  The patient was seen again in the Holding Room. The risks, benefits, complications, treatment options, and expected outcomes were discussed with the patient. The possibilities of reaction to medication, pulmonary aspiration, perforation of viscus, bleeding, recurrent infection, finding a normal gallbladder, the need for additional procedures, failure to diagnose a condition, the possible need to convert to an open procedure, and creating a complication requiring transfusion or operation were discussed with the patient. The likelihood of improving the patient's symptoms with return to their baseline status is good.  The patient and/or family concurred with the proposed plan, giving informed consent. The site of surgery properly noted. The patient was taken to Operating Room, identified as Harold Scott and the procedure verified as Laparoscopic Cholecystectomy with Intraoperative Cholangiogram. A Time Out was held and the above information confirmed.  Prior to the induction of general anesthesia, antibiotic prophylaxis was administered. General endotracheal anesthesia was then administered and tolerated well. After the induction, the abdomen was prepped with Chloraprep and draped in the sterile fashion. The patient was positioned in the supine position.  Local anesthetic agent was injected into the skin near the umbilicus and an incision made. We dissected down to the abdominal fascia with blunt dissection.  The fascia was incised vertically and we  entered the peritoneal cavity bluntly.  A pursestring suture of 0-Vicryl was placed around the fascial opening.  The Hasson cannula was inserted and secured with the stay suture.  Pneumoperitoneum was then created with CO2 and tolerated well without any adverse changes in the patient's vital signs. An 5-mm port was placed in the subxiphoid position.  Two 5-mm ports were placed in the right upper quadrant. All skin incisions were infiltrated with a local anesthetic agent before making the incision and placing the trocars.   We positioned the patient in reverse Trendelenburg, tilted slightly to the patient's left.  The gallbladder was identified, the fundus grasped and retracted cephalad. Adhesions were lysed bluntly and with the electrocautery where indicated, taking care not to injure any adjacent organs or viscus. The infundibulum was grasped and retracted laterally, exposing the peritoneum overlying the triangle of Calot. This was then divided and exposed in a blunt fashion. A critical view of the cystic duct and cystic artery was obtained.  The cystic duct was clearly identified and bluntly dissected circumferentially. The cystic duct was ligated with a clip distally.   An incision was made in the cystic duct and the [facility] cholangiogram catheter introduced. The catheter was secured using a clip. A cholangiogram was then obtained which showed good visualization of the distal and proximal biliary tree with no sign of filling defects or obstruction.  Contrast flowed easily into the duodenum. The catheter was then removed.   The cystic duct was then ligated with clips and divided. The cystic artery was identified, dissected free, ligated with clips and divided as well.   The gallbladder was dissected from the liver bed in retrograde fashion with the electrocautery. The gallbladder was removed and placed in an Endocatch sac.  The gallbladder and Endocatch sac were then removed through the umbilical port site. The  liver bed was irrigated and inspected. Hemostasis  was achieved with the electrocautery. Copious irrigation was utilized and was repeatedly aspirated until clear.  The pursestring suture was used to close the umbilical fascia.  There was a little give so I placed an additional figure of eight 0-vicryl suture.  We again inspected the right upper quadrant for hemostasis.  The umbilical closure was inspected and there was no air leak and nothing trapped within the closure. Pneumoperitoneum was released as we removed the trocars.  4-0 Monocryl was used to close the skin.   Benzoin, steri-strips, and clean dressings were applied. The patient was then extubated and brought to the recovery room in stable condition. Instrument, sponge, and needle counts were correct at closure and at the conclusion of the case.   Findings: Chronic Cholecystitis with Cholelithiasis  Estimated Blood Loss: Minimal         Drains: none         Specimens: Gallbladder           Complications: None; patient tolerated the procedure well.         Disposition: PACU - hemodynamically stable.         Condition: stable  Harold Scott. Andrey Campanile, MD, FACS General, Bariatric, & Minimally Invasive Surgery [facility], [place]

## 2012-08-05 NOTE — Anesthesia Postprocedure Evaluation (Signed)
  Anesthesia Post-op Note  Patient: Harold Scott  Procedure(s) Performed: Procedure(s) (LRB) with comments: LAPAROSCOPIC CHOLECYSTECTOMY WITH INTRAOPERATIVE CHOLANGIOGRAM (N/A)  Patient Location: PACU  Anesthesia Type:General  Level of Consciousness: awake  Airway and Oxygen Therapy: Patient Spontanous Breathing  Post-op Pain: mild  Post-op Assessment: Post-op Vital signs reviewed  Post-op Vital Signs: stable  Complications: No apparent anesthesia complications

## 2012-08-05 NOTE — Progress Notes (Signed)
VOIDED WITHOUT DIFFICULTY; YAY!

## 2012-08-05 NOTE — Anesthesia Procedure Notes (Signed)
Procedure Name: Intubation Date/Time: 08/05/2012 12:42 PM Performed by: Lovie Chol Pre-anesthesia Checklist: Patient identified, Emergency Drugs available, Suction available, Patient being monitored and Timeout performed Patient Re-evaluated:Patient Re-evaluated prior to inductionOxygen Delivery Method: Circle system utilized Preoxygenation: Pre-oxygenation with 100% oxygen Intubation Type: IV induction Ventilation: Mask ventilation without difficulty and Oral airway inserted - appropriate to patient size Laryngoscope Size: Miller and 2 Grade View: Grade I Tube type: Oral Tube size: 7.5 mm Number of attempts: 1 Airway Equipment and Method: Stylet Placement Confirmation: ETT inserted through vocal cords under direct vision,  positive ETCO2,  CO2 detector and breath sounds checked- equal and bilateral Secured at: 22 cm Tube secured with: Tape Dental Injury: Teeth and Oropharynx as per pre-operative assessment  Difficulty Due To: Difficulty was anticipated, Difficult Airway- due to limited oral opening and Difficult Airway- due to large tongue Future Recommendations: Recommend- induction with short-acting agent, and alternative techniques readily available

## 2012-08-05 NOTE — Preoperative (Signed)
Beta Blockers   Reason not to administer Beta Blockers:Not Applicable 

## 2012-08-05 NOTE — H&P (View-Only) (Signed)
Patient ID: Harold Scott, male   DOB: 1960/11/30, 51 y.o.   MRN: [id number]  Chief Complaint  Patient presents with  . Abdominal Pain    HPI Harold Scott is a 51 y.o. male.   HPI 51 year old Caucasian male comes in to discuss gallstone disease. He was seen in the emergency room on November 8 by Dr. Gerrit Friends and will for evaluation for cholelithiasis. The patient states on November 8 he awoke at 4 AM with a severe stomachache. It didn't get much better. He states that it felt like the worse case of gas he ever had. He states however that he went on to work but at around 8 AM he couldn't tolerate the pain anymore which prompted him to go to the [facility] emergency department. He states that he felt very bloated. He was in the ER for quite a long time. His workup included an abdominal ultrasound, labs, and ultimately a CT scan. He states that he had a lot of pressure in his epigastric region. Since there is no evidence of acute cholecystitis and his pain improved he was discharged home. He states that he has had an odd feeling in his right upper abdomen for several months. It has been intermittent however over the past several days it is been more of a constant feeling. He describes it as an odd feeling. He states  that this feels like someone is poking him in that spot. He has noticed that certain foods like tomato-based foods increase the discomfort. He did have an episode of nausea and vomiting in the emergency room. He has a history of gastroesophageal reflux disease. He states that this is different. He has had some loose stools intermittently over the past week. He denies any fever, chills, NSAID use, melena, hematochezia. He denies any difficulty swallowing foods. Past Medical History  Diagnosis Date  . Atrial fibrillation   . Anxiety   . Depression   . GERD (gastroesophageal reflux disease)   . Gallstones   . Asthma     Past Surgical History  Procedure Date  . Hand surgery   .  Tonsillectomy   . Wisdom tooth extraction     Family History  Problem Relation Age of Onset  . Adopted: Yes    Social History History  Substance Use Topics  . Smoking status: Former Smoker    Quit date: 07/23/2006  . Smokeless tobacco: Never Used  . Alcohol Use: Yes     Comment: daily    Allergies  Allergen Reactions  . Penicillins Hives    REACTION: hives    Current Outpatient Prescriptions  Medication Sig Dispense Refill  . albuterol (PROVENTIL HFA;VENTOLIN HFA) 108 (90 BASE) MCG/ACT inhaler Inhale 2 puffs into the lungs every 6 (six) hours as needed for wheezing.  1 Inhaler  1  . citalopram (CELEXA) 10 MG tablet Take 10 mg by mouth daily.       . clorazepate (TRANXENE) 7.5 MG tablet Take 7.5 mg by mouth at bedtime.       Marland Kitchen esomeprazole (NEXIUM) 40 MG capsule Take 40 mg by mouth 2 (two) times daily.      . metoprolol succinate (TOPROL-XL) 25 MG 24 hr tablet Take 25 mg by mouth daily.      . vitamin B-12 (CYANOCOBALAMIN) 1000 MCG tablet Take 1,000 mcg by mouth daily.        . vitamin C (ASCORBIC ACID) 500 MG tablet Take 500 mg by mouth daily.        Marland Kitchen  HYDROmorphone (DILAUDID) 4 MG tablet Take 1 tablet (4 mg total) by mouth every 4 (four) hours as needed for pain.  20 tablet  0    Review of Systems Review of Systems  Constitutional: Negative for fever, chills, appetite change and unexpected weight change.  HENT: Negative for congestion and trouble swallowing.   Eyes: Negative for visual disturbance.  Respiratory: Negative for chest tightness and shortness of breath.   Cardiovascular: Negative for chest pain and leg swelling.       No PND, no orthopnea, no DOE; h/o parx Afib- last episode in Jan; states he has had a stress test in the past.   Gastrointestinal:       See HPI  Genitourinary: Negative for dysuria and hematuria.  Musculoskeletal: Negative.   Skin: Negative for rash.  Neurological: Negative for seizures and speech difficulty.       No TIAs, amaurosis  fugax  Hematological: Does not bruise/bleed easily.  Psychiatric/Behavioral: Negative for behavioral problems and confusion.    Blood pressure 122/82, pulse 74, temperature 98.5 F (36.9 C), resp. rate 12, height 5\' 8"  (1.727 m), weight 252 lb 9.6 oz (114.579 kg).  Physical Exam Physical Exam  Vitals reviewed. Constitutional: He is oriented to person, place, and time. He appears well-developed. No distress.       obese  HENT:  Head: Normocephalic and atraumatic.  Right Ear: External ear normal.  Left Ear: External ear normal.  Eyes: Conjunctivae normal are normal. No scleral icterus.  Neck: Normal range of motion. Neck supple. No tracheal deviation present.  Cardiovascular: Normal rate, regular rhythm and normal heart sounds.   Pulmonary/Chest: Effort normal and breath sounds normal. No stridor. No respiratory distress. He has no wheezes.  Abdominal: Soft. He exhibits no distension. There is tenderness in the right upper quadrant. There is no rebound and no guarding.       Mild TTP in ruq  Musculoskeletal: Normal range of motion. He exhibits no edema and no tenderness.  Lymphadenopathy:    He has no cervical adenopathy.  Neurological: He is alert and oriented to person, place, and time. He exhibits normal muscle tone.  Skin: Skin is warm and dry. No rash noted. He is not diaphoretic. No erythema.  Psychiatric: He has a normal mood and affect. His behavior is normal. Judgment and thought content normal.    Data Reviewed Dr Ardine Eng and Will Marlyne Beards' note from 11/8 ED provider note 11/8 Labs 11/8  CT ABDOMEN AND PELVIS WITH CONTRAST  Technique: Multidetector CT imaging of the abdomen and pelvis was  performed following the standard protocol during bolus  administration of intravenous contrast.  Contrast: OMNIPAQUE IOHEXOL 300 MG/ML SOLN  Comparison: Ultrasound, 06/27/2012. CT, 08/22/2006  Findings: The cardiac silhouette is normal in size. There is minor  subsegmental  atelectasis at the lung bases. Lung bases are  otherwise clear.  There is mild fatty infiltration of the liver. The liver is  otherwise unremarkable. Normal spleen. Multiple gallstones are  noted within an otherwise unremarkable gallbladder. No  pericholecystic inflammation is seen to suggest acute  cholecystitis. No bile duct dilation. The pancreas is within  normal limits. No adrenal masses. Normal kidneys, ureters and  bladder. No adenopathy. No abnormal fluid collections. Normal  bowel. Normal appendix. There are degenerative changes of the  visualized spine most prominent at L4-L5. 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 No osteoblastic or osteolytic lesions.  IMPRESSION:  No acute findings.  CT also demonstrates gallstones but  no evidence of acute  cholecystitis.  Mild fatty infiltration of the liver.  Degenerative changes of the visualized spine with chronic bilateral  pars defects at L5-S1.  COMPLETE ABDOMINAL ULTRASOUND  Comparison: Ultrasound 08/08/2011, CT 08/22/2006  Findings:  Gallbladder: There are multiple mobile gallstones within the  gallbladder with posterior shadowing. Stones measure up to 1.5 cm  in number approximately 6 to8. There is no pericholecystic fluid  or gallbladder wall thickening. Negative sonographic Murphy's  sign.  Common bile duct: Normal at 5 mm  Liver: Liver is diffusely increased in echogenicity with no ductal  dilatation.  IVC: Appears normal.  Pancreas: No focal abnormality seen.  Spleen: Normal in size and echogenicity  Right Kidney: 12.9 cm in length. No evidence of hydronephrosis or  stones.  Left Kidney: 12.3cm in length. No evidence of hydronephrosis or  stones.  Abdominal aorta: No aneurysm identified.  IMPRESSION:  1. Multiple gallstones without evidence of cholecystitis.  2. Normal common bile duct.  3. Mild increased liver echogenicity commonly represents hepatic  steatosis.   Assessment    Symptomatic  cholelithiasis GERD    Plan    I believe the patient's most recent symptoms are consistent with gallbladder disease.  We discussed gallbladder disease. The patient was given Agricultural engineer. We discussed non-operative and operative management. We discussed the signs & symptoms of acute cholecystitis  I discussed laparoscopic cholecystectomy with IOC in detail.  The patient was given educational material as well as diagrams detailing the procedure.  We discussed the risks and benefits of a laparoscopic cholecystectomy including, but not limited to bleeding, infection, injury to surrounding structures such as the intestine or liver, bile leak, retained gallstones, need to convert to an open procedure, prolonged diarrhea, blood clots such as  DVT, common bile duct injury, anesthesia risks, and possible need for additional procedures.  We discussed the typical post-operative recovery course. I explained that the likelihood of improvement of their symptoms is good.  I did explain that some his symptoms are probably related to GERD and will not improve with cholecystectomy  The patient has elected to proceed with LAPAROSCOPIC CHOLECYSTECTOMY with IOC  Mary Sella. Andrey Campanile, MD, FACS General, Bariatric, & Minimally Invasive Surgery [facility], [place]        [facility] M 07/11/2012, 10:10 AM

## 2012-08-05 NOTE — Anesthesia Preprocedure Evaluation (Addendum)
Anesthesia Evaluation  Patient identified by MRN, date of birth, ID band Patient awake    Reviewed: Allergy & Precautions, H&P , NPO status , Patient's Chart, lab work & pertinent test results, reviewed documented beta blocker date and time   History of Anesthesia Complications (+) PONV  Airway Mallampati: II TM Distance: >3 FB Neck ROM: Full    Dental  (+) Teeth Intact and Dental Advisory Given   Pulmonary asthma , former smoker,  1/2 ppd x 30 years; quit 6 years ago breath sounds clear to auscultation        Cardiovascular + dysrhythmias Atrial Fibrillation Rhythm:Irregular Rate:Normal     Neuro/Psych PSYCHIATRIC DISORDERS Anxiety Depression    GI/Hepatic Neg liver ROS, GERD-  Medicated and Controlled,  Endo/Other  negative endocrine ROSMorbid obesity  Renal/GU negative Renal ROS     Musculoskeletal   Abdominal (+) + obese,   Peds  Hematology   Anesthesia Other Findings   Reproductive/Obstetrics                          Anesthesia Physical Anesthesia Plan  ASA: II  Anesthesia Plan: General   Post-op Pain Management:    Induction: Intravenous  Airway Management Planned: Oral ETT  Additional Equipment:   Intra-op Plan:   Post-operative Plan: Extubation in OR  Informed Consent:   Dental advisory given  Plan Discussed with: CRNA and Surgeon  Anesthesia Plan Comments:         Anesthesia Quick Evaluation

## 2012-08-05 NOTE — Transfer of Care (Signed)
Immediate Anesthesia Transfer of Care Note  Patient: Harold Scott  Procedure(s) Performed: Procedure(s) (LRB) with comments: LAPAROSCOPIC CHOLECYSTECTOMY WITH INTRAOPERATIVE CHOLANGIOGRAM (N/A)  Patient Location: PACU  Anesthesia Type:General  Level of Consciousness: awake, alert , oriented and patient cooperative  Airway & Oxygen Therapy: Patient Spontanous Breathing and Patient connected to face mask oxygen  Post-op Assessment: Report given to PACU RN and Post -op Vital signs reviewed and stable  Post vital signs: Reviewed and stable  Complications: No apparent anesthesia complications

## 2012-08-05 NOTE — Interval H&P Note (Signed)
History and Physical Interval Note:  08/05/2012 11:50 AM  Harold Scott  has presented today for surgery, with the diagnosis of symptomatic cholelithiasis  The various methods of treatment have been discussed with the patient and family. After consideration of risks, benefits and other options for treatment, the patient has consented to  Procedure(s) (LRB) with comments: LAPAROSCOPIC CHOLECYSTECTOMY WITH INTRAOPERATIVE CHOLANGIOGRAM (N/A) as a surgical intervention .  The patient's history has been reviewed, patient examined, no change in status, stable for surgery.  I have reviewed the patient's chart and labs.  Questions were answered to the patient's satisfaction.    Mary Sella. Andrey Campanile, MD, FACS General, Bariatric, & Minimally Invasive Surgery [facility], [place]  [facility] M

## 2012-08-07 ENCOUNTER — Encounter (HOSPITAL_COMMUNITY): Payer: Self-pay | Admitting: General Surgery

## 2012-08-07 ENCOUNTER — Telehealth (INDEPENDENT_AMBULATORY_CARE_PROVIDER_SITE_OTHER): Payer: Self-pay

## 2012-08-07 NOTE — Telephone Encounter (Signed)
Appt made for urgent office tomorrow to check before the weekend. If redness goes away over night patient instructed to call and cancel appt.

## 2012-08-07 NOTE — Telephone Encounter (Signed)
The patient called in s/p lap chole on 12/17.  Since yesterday he has redness 2.5inches above and below the umbilical incision.  It is worse today.  It is warm to touch.  There is no swelling or drainage.  He is having stabbing pain at the incision but this is unchanged since surgery.  He has a postop appointment in February so I want to see if Dr Andrey Campanile wants to see him sooner or have him watch it for a little while.

## 2012-08-08 ENCOUNTER — Encounter (INDEPENDENT_AMBULATORY_CARE_PROVIDER_SITE_OTHER): Payer: PRIVATE HEALTH INSURANCE | Admitting: General Surgery

## 2012-08-22 ENCOUNTER — Encounter (INDEPENDENT_AMBULATORY_CARE_PROVIDER_SITE_OTHER): Payer: PRIVATE HEALTH INSURANCE | Admitting: General Surgery

## 2012-08-27 ENCOUNTER — Ambulatory Visit (INDEPENDENT_AMBULATORY_CARE_PROVIDER_SITE_OTHER): Payer: PRIVATE HEALTH INSURANCE | Admitting: Cardiovascular Disease

## 2012-08-27 ENCOUNTER — Encounter: Payer: Self-pay | Admitting: Cardiovascular Disease

## 2012-08-27 VITALS — BP 139/82 | HR 56 | Ht 68.0 in | Wt 258.0 lb

## 2012-08-27 DIAGNOSIS — I4891 Unspecified atrial fibrillation: Secondary | ICD-10-CM

## 2012-08-27 DIAGNOSIS — K8020 Calculus of gallbladder without cholecystitis without obstruction: Secondary | ICD-10-CM

## 2012-08-27 DIAGNOSIS — I480 Paroxysmal atrial fibrillation: Secondary | ICD-10-CM

## 2012-08-27 NOTE — Progress Notes (Signed)
Patient ID: Harold Scott, male   DOB: 1960/12/24, 52 y.o.   MRN: [id number] 52 yo with episode of PAF last year during ? Panic attack. Short lived and placed on Toprol by primary with no further w/u/ Over last 6 months has had two attacks of biliary colic. Clearly RUQ/epigastric pain with stones. Had lap choly with  Dr Andrey Campanile 08/05/12  . No chest pain or dypsnea. Compliant with Toprol. No syncope or palpitations. No previous anesthetic issues or bleeding diathesis. Does tend to have anxiety. TSH was 11 a year ago  Echo 07/30/12  EF 65% moderate to severe LAE  No complicatoins with surgery.  Appears to be in NSR now.  No real palpitations since surgery  ROS: Denies fever, malais, weight loss, blurry vision, decreased visual acuity, cough, sputum, SOB, hemoptysis, pleuritic pain, palpitaitons, heartburn, abdominal pain, melena, lower extremity edema, claudication, or rash.  All other systems reviewed and negative  General: Affect appropriate Overweight white male HEENT: normal Neck supple with no adenopathy JVP normal no bruits no thyromegaly Lungs clear with no wheezing and good diaphragmatic motion Heart:  S1/S2 no murmur, no rub, gallop or click PMI normal Abdomen: benighn, BS positve, no tenderness, no AAA S/P lap choly no bruit.  No HSM or HJR Distal pulses intact with no bruits No edema Neuro non-focal Skin warm and dry No muscular weakness   Current Outpatient Prescriptions  Medication Sig Dispense Refill  . albuterol (PROVENTIL HFA;VENTOLIN HFA) 108 (90 BASE) MCG/ACT inhaler Inhale 2 puffs into the lungs every 6 (six) hours as needed. Or shortness of breath      . Ascorbic Acid (VITAMIN C) 1000 MG tablet Take 1,000 mg by mouth daily.      . citalopram (CELEXA) 10 MG tablet Take 10 mg by mouth daily.       . clorazepate (TRANXENE) 7.5 MG tablet Take 7.5 mg by mouth at bedtime.       Marland Kitchen esomeprazole (NEXIUM) 40 MG capsule Take 40 mg by mouth 2 (two) times daily as needed. For acid  reflux      . guaiFENesin (MUCINEX) 600 MG 12 hr tablet Take 1,200 mg by mouth 2 (two) times daily as needed. For cough      . ibuprofen (ADVIL,MOTRIN) 200 MG tablet Take 400 mg by mouth every 6 (six) hours as needed. For pain      . metoprolol succinate (TOPROL-XL) 25 MG 24 hr tablet Take 25 mg by mouth 2 (two) times daily.      . vitamin B-12 (CYANOCOBALAMIN) 1000 MCG tablet Take 1,000 mcg by mouth daily.          Allergies  Penicillins  Electrocardiogram:  AFib rate 94  Otherwise normal    Assessment and Plan

## 2012-08-27 NOTE — Assessment & Plan Note (Signed)
S/P lap choly by Dr Andrey Campanile Wounds healed well and trying to adhere to low fat diet

## 2012-08-27 NOTE — Patient Instructions (Signed)
Your physician has requested that you have an exercise tolerance test. For further information please visit [email or web address] Please also follow instruction sheet, as given.  Your physician wants you to follow-up in: 6 months with Dr Eden Emms.  You will receive a reminder letter in the mail two months in advance. If you don't receive a letter, please call our office to schedule the follow-up appointment.

## 2012-08-27 NOTE — Assessment & Plan Note (Signed)
PAF resolved with restoration of NSR  ASA  F/U ETT to r/o CAD and exercise induced arrhythmia.  With moderate to severe LAE may recur If he has frequent recurrent palpitations would document afib with event monitor and consider pill in pocket Flecainide which is another reason for ETT

## 2012-09-16 ENCOUNTER — Encounter: Payer: Self-pay | Admitting: Nurse Practitioner

## 2012-09-16 ENCOUNTER — Ambulatory Visit (INDEPENDENT_AMBULATORY_CARE_PROVIDER_SITE_OTHER): Payer: PRIVATE HEALTH INSURANCE | Admitting: Nurse Practitioner

## 2012-09-16 VITALS — BP 156/100 | HR 66

## 2012-09-16 DIAGNOSIS — I4891 Unspecified atrial fibrillation: Secondary | ICD-10-CM

## 2012-09-16 DIAGNOSIS — I480 Paroxysmal atrial fibrillation: Secondary | ICD-10-CM

## 2012-09-16 MED ORDER — METOPROLOL SUCCINATE ER 50 MG PO TB24: 50.0000 mg | ORAL_TABLET | Freq: Every day | ORAL | Status: DC

## 2012-09-16 NOTE — Patient Instructions (Addendum)
Monitor your blood pressure at home. Get a BP cuff (extra large cuff) Omron brand   Stay on your current medicines  Goal BP is less than 135/85  Ok to exercise on the treadmill, start slow and work up to 45 to 60 minutes per day  I will see you in 6 to 8 weeks to check on you. Bring your blood pressure diary in for review.   Call the [facility] office at [phone] if you have any questions, problems or concerns.

## 2012-09-16 NOTE — Progress Notes (Signed)
Exercise Treadmill Test  Pre-Exercise Testing Evaluation Rhythm: normal sinus  Rate: 100                 Test  Exercise Tolerance Test Ordering MD: Charlton Haws, MD  Interpreting MD: Norma Fredrickson, NP  Unique Test No: 1   Treadmill:  1  Indication for ETT: A Fib  Contraindication to ETT: No   Stress Modality: exercise - treadmill  Cardiac Imaging Performed: non   Protocol: standard Bruce - maximal  Max BP:  209/92  Max MPHR (bpm):  170 85% MPR (bpm):  145  MPHR obtained (bpm):  148 % MPHR obtained:  86%  Reached 85% MPHR (min:sec):  5:45 Total Exercise Time (min-sec):  6:00  Workload in METS:  7.0 Borg Scale: 16  Reason ETT Terminated:  patient's desire to stop    ST Segment Analysis At Rest: normal ST segments - no evidence of significant ST depression With Exercise: no evidence of significant ST depression  Other Information Arrhythmia:  Yes Angina during ETT:  absent (0) Quality of ETT:  diagnostic  ETT Interpretation:  normal - no evidence of ischemia by ST analysis  Comments: Patient presents today for routine GXT. Has had PAF - back in sinus. He was aware of his atrial fib and was symptomatic. He has HTN and obesity. Very sedentary lifestyle.  Echo showing EF 65% with moderate to severe LVH. Has done well since his last visit here and says he has been in rhythm. He is anxious to exercise and start working on his weight.   Today he exercised on the standard Bruce protocol for 6 minutes. He held his Toprol since yesterday morning. He has poor and reduced exercise tolerance. BP response was mildly hypertensive. Clinically with fatigue and dyspnea. No chest pain. EKG negative for ischemia. Very short runs of PAT in recovery that resolved without intervention and not all caught on the EKG. Rare PVC noted as well. Overall, the test is felt to be negative for ischemia.   Recommendations: Ok to start a walking program and begin risk factor modification - diet/weight  loss/exercise.  He needs to be monitoring his blood pressure at home. Goal is less than 135/85 I have asked him to obtain a BP cuff I will see him in 2 to 3 months to assess his progress.  Patient is agreeable to this plan and will call if any problems develop in the interim.

## 2012-09-26 ENCOUNTER — Encounter (INDEPENDENT_AMBULATORY_CARE_PROVIDER_SITE_OTHER): Payer: Self-pay | Admitting: General Surgery

## 2012-09-26 ENCOUNTER — Ambulatory Visit (INDEPENDENT_AMBULATORY_CARE_PROVIDER_SITE_OTHER): Payer: PRIVATE HEALTH INSURANCE | Admitting: General Surgery

## 2012-09-26 VITALS — BP 142/80 | HR 64 | Resp 20 | Ht 68.0 in | Wt 258.6 lb

## 2012-09-26 DIAGNOSIS — Z09 Encounter for follow-up examination after completed treatment for conditions other than malignant neoplasm: Secondary | ICD-10-CM

## 2012-09-26 NOTE — Patient Instructions (Signed)
Can resume full activities Download My Fitness Pal like we discussed Call us if you'd like to attend one of our free weight loss seminars

## 2012-09-26 NOTE — Progress Notes (Signed)
Subjective:     Patient ID: Harold Scott, male   DOB: [birth year], 52 y.o.   MRN: [id number]  HPI 52 year old Caucasian male comes in after undergoing laparoscopic cholecystectomy with intraoperative cholangiogram on December 17. He states that he has been doing well since surgery. He states that he has not had any of the preoperative symptoms that he had before surgery. He denies any fevers, chills, nausea, vomiting, diarrhea or constipation. He did have a tendency of loose stools preoperatively and he states that his stool pattern has improved since surgery. He reports a good appetite. He denies any abdominal pain. He's concerned about his weight.  Review of Systems     Objective:   Physical Exam BP 142/80  Pulse 64  Resp 20  Ht 5\' 8"  (1.727 m)  Wt 258 lb 9.6 oz (117.3 kg)  BMI 39.32 kg/m2  Gen: alert, NAD, non-toxic appearing Pupils: equal, no scleral icterus Abd: soft, nontender, nondistended. Well-healed trocar sites. No cellulitis. No incisional hernia Ext: no edema, no calf tenderness Skin: no rash, no jaundice     Assessment:     Status post laparoscopic cholecystectomy with interoperative cholangiogram for chronic active cholecystitis and cholelithiasis    Plan:     We reviewed his pathology report. He appears to be doing very well. I have released him to full activities. We discussed weight loss. He appears to be on an exercise regimen by his cardiologist. I discussed the Smart phone app My Fitness Pal and Encouraged him to log his calories for about a week to see how much calories he takes in on a daily basis. I encouraged him to keep up with his exercise regimen. It appears he is doing interval training program on the treadmill which is good. I encouraged him to contact our office if he would like to attend one of our surgical weight loss Seminars. Followup as needed  Mary Sella. Andrey Campanile, MD, FACS General, Bariatric, & Minimally Invasive Surgery [facility],  [place]  .

## 2012-10-07 ENCOUNTER — Telehealth: Payer: Self-pay | Admitting: *Deleted

## 2012-10-07 NOTE — Telephone Encounter (Signed)
Patient is calling because he has had diarrhea since he has had his gall bladder removed.  He is not sure if this may be a side effect from his medication he is now taking or if it was the gall bladder removal. He has no fever or pain. Suggestions please.  [facility]

## 2012-10-08 MED ORDER — CHOLESTYRAMINE 4 GM/DOSE PO POWD: 2.0000 g | Freq: Every day | ORAL | Status: DC

## 2012-10-08 NOTE — Telephone Encounter (Signed)
Rx per Dr Tawanna Cooler.   Rx sent and patient is aware.

## 2012-10-15 ENCOUNTER — Other Ambulatory Visit: Payer: Self-pay | Admitting: Family Medicine

## 2012-11-11 ENCOUNTER — Ambulatory Visit: Payer: PRIVATE HEALTH INSURANCE | Admitting: Nurse Practitioner

## 2012-11-26 ENCOUNTER — Encounter: Payer: Self-pay | Admitting: Physician Assistant

## 2012-11-26 ENCOUNTER — Ambulatory Visit (INDEPENDENT_AMBULATORY_CARE_PROVIDER_SITE_OTHER): Payer: PRIVATE HEALTH INSURANCE | Admitting: Physician Assistant

## 2012-11-26 VITALS — BP 124/80 | HR 57 | Ht 70.0 in | Wt 258.6 lb

## 2012-11-26 DIAGNOSIS — R5383 Other fatigue: Secondary | ICD-10-CM

## 2012-11-26 DIAGNOSIS — R0609 Other forms of dyspnea: Secondary | ICD-10-CM

## 2012-11-26 DIAGNOSIS — I480 Paroxysmal atrial fibrillation: Secondary | ICD-10-CM

## 2012-11-26 DIAGNOSIS — I4891 Unspecified atrial fibrillation: Secondary | ICD-10-CM

## 2012-11-26 DIAGNOSIS — R0683 Snoring: Secondary | ICD-10-CM

## 2012-11-26 MED ORDER — ASPIRIN 81 MG PO TABS: 81.0000 mg | ORAL_TABLET | Freq: Every day | ORAL | Status: DC

## 2012-11-26 NOTE — Progress Notes (Signed)
[address], [address] [place], [place]  [address] Phone: [phone] Fax:  [phone]  Date:  11/26/2012   ID:  Harold Scott, DOB 12/29/1960, MRN [id number]  PCP:  Harold Georges, MD  Primary Cardiologist:  Harold Scott     History of Present Illness: Harold Scott is a 52 y.o. male who returns for follow up.  He has a hx of paroxysmal atrial fibrillation, GERD, anxiety/depression. CHADS2=0.  Echo 12/13: Moderate LVH, focal basal hypertrophy, EF 60-65%, moderate to severe LAE.  Last seen by Harold Scott 1/14. Exercise treadmill test 1/14: Exercised for 6 minutes, no ischemic EKG changes.  He was told to get a BP machine.  He has been tracking these and I have reviewed.  His BPs range 90/60-120/80.  He is doing well.  The patient denies chest pain, shortness of breath, syncope, orthopnea, PND or significant pedal edema.   Labs (12/12):  TSH 11.502 Labs (12/13):  K 3.9, Cr 0.79, ALT 31, Hgb 13.1   Wt Readings from Last 3 Encounters:  11/26/12 258 lb 9.6 oz (117.3 kg)  09/26/12 258 lb 9.6 oz (117.3 kg)  08/27/12 258 lb (117.028 kg)     Past Medical History  Diagnosis Date  . Anxiety   . Depression   . GERD (gastroesophageal reflux disease)   . Gallstones   . Asthma   . PONV (postoperative nausea and vomiting)   . Atrial fibrillation     pcp Dr Scott    Current Outpatient Prescriptions  Medication Sig Dispense Refill  . albuterol (PROVENTIL HFA;VENTOLIN HFA) 108 (90 BASE) MCG/ACT inhaler Inhale 2 puffs into the lungs every 6 (six) hours as needed. Or shortness of breath      . Ascorbic Acid (VITAMIN C) 1000 MG tablet Take 1,000 mg by mouth daily.      . cholestyramine (QUESTRAN) 4 GM/DOSE powder Take 0.5 packets (2 g total) by mouth daily.  378 g  10  . citalopram (CELEXA) 10 MG tablet Take 10 mg by mouth daily.       . clorazepate (TRANXENE) 7.5 MG tablet Take 7.5 mg by mouth at bedtime.       Marland Kitchen esomeprazole (NEXIUM) 40 MG capsule Take 40 mg by mouth 2  (two) times daily as needed. For acid reflux      . guaiFENesin (MUCINEX) 600 MG 12 hr tablet Take 1,200 mg by mouth 2 (two) times daily as needed. For cough      . ibuprofen (ADVIL,MOTRIN) 200 MG tablet Take 400 mg by mouth every 6 (six) hours as needed. For pain      . metoprolol succinate (TOPROL-XL) 50 MG 24 hr tablet Take 1 tablet (50 mg total) by mouth daily. Take with or immediately following a meal.  90 tablet  3  . vitamin B-12 (CYANOCOBALAMIN) 1000 MCG tablet Take 1,000 mcg by mouth daily.         No current facility-administered medications for this visit.    Allergies:    Allergies  Allergen Reactions  . Penicillins Hives    REACTION: hives    Social History:  The patient  reports that he quit smoking about 6 years ago. He has never used smokeless tobacco. He reports that  drinks alcohol. He reports that he does not use illicit drugs.   ROS:  Please see the history of present illness.  He has a significant hx of snoring and daytime hypersomnolence.   All other systems reviewed and negative.  PHYSICAL EXAM: VS:  BP 124/80  Pulse 57  Ht 5\' 10"  (1.778 m)  Wt 258 lb 9.6 oz (117.3 kg)  BMI 37.11 kg/m2 Well nourished, well developed, in no acute distress HEENT: normal Neck: no JVD Endo:  No TM Cardiac:  normal S1, S2; RRR; no murmur Lungs:  clear to auscultation bilaterally, no wheezing, rhonchi or rales Abd: soft, nontender, no hepatomegaly Ext: no edema Skin: warm and dry Neuro:  CNs 2-12 intact, no focal abnormalities noted  EKG:  Sinus brady, HR 57, normal axis, no acute changes     ASSESSMENT AND PLAN:  1. Paroxysmal Atrial Fibrillation:  Maintaining NSR.  He is symptomatic with paroxysms and has had no palpitations.  CHADS2=0.  He can take ASA 81 mg QD. 2. Snoring:  With his LAE and hx of AFib, I am concerned he has OSA.  Will arrange split night sleep study. 3. Fatigue:  TSH was quite high in the past.  He does not recall this being repeated. I will check a  follow up TSH today.  If high, he will need f/u with his PCP.  4. Disposition:  F/u with Harold Scott in 6 mos.  Luna Glasgow, PA-C  4:23 PM 11/26/2012

## 2012-11-26 NOTE — Patient Instructions (Addendum)
Your physician has recommended you make the following change in your medication: START ASPIRIN ( 81 MG ) DAILY   Your physician recommends that you HAVE  Lab work TODAY: TSH, SOMEONE FROM THE OFFICE WILL CALL YOU BACK WITH RESULTS   Your physician has recommended that you have a sleep study. This test records several body functions during sleep, including: brain activity, eye movement, oxygen and carbon dioxide blood levels, heart rate and rhythm, breathing rate and rhythm, the flow of air through your mouth and nose, snoring, body muscle movements, and chest and belly movement.   Your physician wants you to follow-up in: 6 MONTHS WITH DR. Eden Emms.  You will receive a reminder letter in the mail two months in advance. If you don't receive a letter, please call our office to schedule the follow-up appointment @  [phone]

## 2012-11-28 ENCOUNTER — Telehealth: Payer: Self-pay | Admitting: *Deleted

## 2012-11-28 NOTE — Telephone Encounter (Signed)
Message copied by Tarri Fuller on Fri Nov 28, 2012 10:48 AM ------      Message from: [place], [place] T      Created: Thu Nov 27, 2012  5:07 PM       Normal TSH      Continue with current treatment plan.      Tereso Newcomer, PA-C  5:07 PM 11/27/2012 ------

## 2012-11-28 NOTE — Telephone Encounter (Signed)
pt notified about lab results with verbal understanding  

## 2012-12-12 ENCOUNTER — Telehealth: Payer: Self-pay | Admitting: Cardiovascular Disease

## 2012-12-12 NOTE — Telephone Encounter (Signed)
New problem   Pt had an episode of Afib yesterday afternoon for 3-4hours and stated no one told him what to do when this happens. Please call pt concerning matter.

## 2012-12-12 NOTE — Telephone Encounter (Signed)
Pt called to report a 3-4 hour episode of afib yesterday that started while walking across [place] [employer] [facility].  He became sob and diaphoretic.  After resting awhile, symptoms subsided.  Per Dr. Fabio Bering previous OV note, I suggested a 30 day event monitor to document the a-fib.  Pt states that he only has episodes every couple of months and feels like the monitor will not catch it.  Advised pt that the event monitor is the next logical step.  Pt wants to hear Dr. Fabio Bering recommendations prior to a monitor.  Told him this is Dr. Fabio Bering recommendation.  Not sure what patient is looking for.  Will forward to Dr. Eden Emms and Wynona Canes.

## 2012-12-14 NOTE — Telephone Encounter (Signed)
If he is getting afib more often can start flecainide 100 bid and f/u ETT in 10-14 days.  Make f/u with EP

## 2012-12-15 ENCOUNTER — Other Ambulatory Visit: Payer: Self-pay | Admitting: *Deleted

## 2012-12-15 MED ORDER — CHOLESTYRAMINE 4 GM/DOSE PO POWD: 4.0000 g | Freq: Every day | ORAL | Status: DC

## 2012-12-15 NOTE — Telephone Encounter (Signed)
BUSY ./CY

## 2012-12-17 ENCOUNTER — Ambulatory Visit (HOSPITAL_BASED_OUTPATIENT_CLINIC_OR_DEPARTMENT_OTHER): Payer: PRIVATE HEALTH INSURANCE | Attending: Physician Assistant

## 2012-12-17 VITALS — Ht 68.0 in | Wt 255.0 lb

## 2012-12-17 DIAGNOSIS — R5383 Other fatigue: Secondary | ICD-10-CM

## 2012-12-17 DIAGNOSIS — G4733 Obstructive sleep apnea (adult) (pediatric): Secondary | ICD-10-CM | POA: Insufficient documentation

## 2012-12-18 NOTE — Telephone Encounter (Signed)
SPOKE WITH PT  AFIB IS INFREQUENT AT THIS TIME AND  LASTING APPROX 4 HOURS  DESCRIBES AS MORE OF INCONVENIENCE  WILL CONTINUE TO MONITOR  AND WILL CALL  BACK IF WISHES   TO INITIATE  RECOMMENDED PLAN OF CARE .Zack Seal

## 2012-12-26 DIAGNOSIS — G4710 Hypersomnia, unspecified: Secondary | ICD-10-CM

## 2012-12-26 DIAGNOSIS — G4730 Sleep apnea, unspecified: Secondary | ICD-10-CM

## 2012-12-26 NOTE — Procedures (Signed)
NAMEDANIELLE, LENTO NO.:  [id number]  MEDICAL RECORD NO.:  [id number]          PATIENT TYPE:  OUT  LOCATION:  SLEEP CENTER                 FACILITY:  [facility]  PHYSICIAN:  Barbaraann Share, MD,FCCPDATE OF BIRTH:  09-Dec-1961  DATE OF STUDY:  12/17/2012                           NOCTURNAL POLYSOMNOGRAM  REFERRING PHYSICIAN:  Tereso Newcomer, PA-C  INDICATION FOR STUDY:  Hypersomnia with sleep apnea.  EPWORTH SLEEPINESS SCORE:  13.  MEDICATIONS:  SLEEP ARCHITECTURE:  The patient had a total sleep time of 286 minutes with no slow-wave sleep and decreased REM.  Sleep onset latency was normal at 20 minutes and sleep efficiency was 74% during the diagnostic portion of the study and remained at 74% during the titration portion of the study.  RESPIRATORY DATA:  The patient underwent a split-night study, where he was found to have 34 obstructive events in the first 121 minute of sleep.  This gave him an apnea-hypopnea index of 17 events per hour during the diagnostic portion of the study.  The events occurred in all body positions and there was moderate snoring noted throughout.  By protocol, he was then fitted with a ResMed AirFit N10 standard CPAP mask, and titration was initiated.  At a final CPAP pressure of 11 cm of water, he had good control of his obstructive events and also snoring.  OXYGEN DATA:  There was O2 desaturation as low as 88% with the patient's obstructive events.  CARDIAC DATA:  No clinically significant arrhythmias were noted.  MOVEMENT-PARASOMNIA:  The patient was noted to have significant leg jerks at beginning of the study, however, totally resolved with treatment of his sleep apnea.  There were no abnormal behaviors noted.  IMPRESSIONS-RECOMMENDATIONS:  Split-night study reveals mild obstructive sleep apnea, with an apnea-hypopnea index of 17 events per hour and oxygen desaturation as low as 88% during the diagnostic portion of the study.  He  was then fitted with a standard ResMed AirFit N10 mask and found to have an optimal continuous positive airway pressure of 11 cm of water.  Since the patient was found to have mild sleep apnea, other treatment options such as weight loss, upper airway surgery, and dental appliance can also be considered.  His degree of sleep apnea does not represent a significant increase in cardiovascular risk.     Barbaraann Share, MD,FCCP Diplomate, American Board of Sleep Medicine    [facility]  D:  12/26/2012 08:16:23  T:  12/26/2012 09:07:30  Job:  [id number]

## 2013-01-01 ENCOUNTER — Other Ambulatory Visit: Payer: Self-pay | Admitting: *Deleted

## 2013-01-01 ENCOUNTER — Telehealth: Payer: Self-pay | Admitting: Cardiovascular Disease

## 2013-01-01 ENCOUNTER — Telehealth: Payer: Self-pay | Admitting: *Deleted

## 2013-01-01 DIAGNOSIS — G4730 Sleep apnea, unspecified: Secondary | ICD-10-CM

## 2013-01-01 MED ORDER — CITALOPRAM HYDROBROMIDE 10 MG PO TABS: 10.0000 mg | ORAL_TABLET | Freq: Every day | ORAL | Status: DC

## 2013-01-01 MED ORDER — CLORAZEPATE DIPOTASSIUM 7.5 MG PO TABS: 7.5000 mg | ORAL_TABLET | Freq: Every day | ORAL | Status: DC

## 2013-01-01 NOTE — Telephone Encounter (Signed)
Okay x 3 months. 

## 2013-01-01 NOTE — Telephone Encounter (Signed)
New problem   Pt want to know results of his sleep test. Please call pt

## 2013-01-01 NOTE — Telephone Encounter (Signed)
PT AWARE OF SLEEP STUDY RESULTS  PER IMPRESSION ON REPORT COPY FORWARDED  TO DR Eden Emms FOR REVIEW .Harold Scott

## 2013-01-01 NOTE — Telephone Encounter (Signed)
Harold Scott (wife) is calling to see if Dr Tawanna Cooler will continue the medication given by patient's therapist.  They have to pay a $50 co-pay office visit every time they need a refill.  The medications are celexa 10 mg and clorazepate 7.5 mg.  Is this okay to refill?

## 2013-01-26 ENCOUNTER — Institutional Professional Consult (permissible substitution): Payer: PRIVATE HEALTH INSURANCE | Admitting: Pulmonary Disease

## 2013-02-03 ENCOUNTER — Institutional Professional Consult (permissible substitution): Payer: PRIVATE HEALTH INSURANCE | Admitting: Pulmonary Disease

## 2013-03-19 ENCOUNTER — Telehealth: Payer: Self-pay | Admitting: Cardiovascular Disease

## 2013-03-19 NOTE — Telephone Encounter (Signed)
New Prob  Pt has a question as to why he has been referred to a pulmonologist.

## 2013-03-19 NOTE — Telephone Encounter (Signed)
SPOKE WITH  PT APPEARS  PT HAD SLEEP STUDY  DONE AND THUS WAS REFERRED TO PULMONARY  TO REVIEW FINDINGS OF STUDY.  PER PT IS STRAPPED AT THIS TIME  FOR FUNDS   51 YEAR OLD SON  HAS JUST HAD HEART SURGERY ENCOURAGED PT TO KEEP APPT WITH DR CLANCE   FOR REVIEW OF  TEST  AND  MOST COST EFFICIENT TREATMENT PLAN  PER PT HAS UPCOMING APPT WITH DR TODD WILL DISCUSS AT THAT TIME WITH PMD./CY

## 2013-03-23 ENCOUNTER — Institutional Professional Consult (permissible substitution): Payer: PRIVATE HEALTH INSURANCE | Admitting: Pulmonary Disease

## 2013-04-07 ENCOUNTER — Other Ambulatory Visit: Payer: Self-pay | Admitting: Family Medicine

## 2013-05-03 ENCOUNTER — Other Ambulatory Visit: Payer: Self-pay | Admitting: Family Medicine

## 2013-06-09 ENCOUNTER — Other Ambulatory Visit: Payer: Self-pay | Admitting: Family Medicine

## 2013-06-10 ENCOUNTER — Telehealth: Payer: Self-pay | Admitting: Family Medicine

## 2013-06-10 NOTE — Telephone Encounter (Signed)
Pt needs a 4:30 appt  any day of the week. Pt needs to get his citalopram (CELEXA) 10 MG tablet filled and needs appt for this med. Is it ok to schedule a 4:30 appt in 1- 2 weeks and fill this med 30 days? [facility]

## 2013-06-10 NOTE — Telephone Encounter (Signed)
Please call patient and schedule an appointment.  Let me know the date and I will fill until he can come in.

## 2013-06-11 NOTE — Telephone Encounter (Signed)
lmom for pt to call and sch 

## 2013-06-12 MED ORDER — CLORAZEPATE DIPOTASSIUM 7.5 MG PO TABS: ORAL_TABLET | ORAL | Status: DC

## 2013-06-12 MED ORDER — CITALOPRAM HYDROBROMIDE 10 MG PO TABS: ORAL_TABLET | ORAL | Status: DC

## 2013-06-12 NOTE — Telephone Encounter (Signed)
Pt made appt for 11/4. Can you send 30 days citalopram (CELEXA) 10 MG tablet  1/ day clorazepate (TRANXENE) 7.5 MG tablet  1/day  [facility]?

## 2013-06-23 ENCOUNTER — Ambulatory Visit (INDEPENDENT_AMBULATORY_CARE_PROVIDER_SITE_OTHER): Payer: PRIVATE HEALTH INSURANCE | Admitting: Family Medicine

## 2013-06-23 ENCOUNTER — Encounter: Payer: Self-pay | Admitting: Family Medicine

## 2013-06-23 VITALS — BP 120/88 | Temp 98.4°F | Wt 278.0 lb

## 2013-06-23 DIAGNOSIS — R197 Diarrhea, unspecified: Secondary | ICD-10-CM | POA: Insufficient documentation

## 2013-06-23 DIAGNOSIS — Z23 Encounter for immunization: Secondary | ICD-10-CM

## 2013-06-23 DIAGNOSIS — I480 Paroxysmal atrial fibrillation: Secondary | ICD-10-CM

## 2013-06-23 DIAGNOSIS — H9313 Tinnitus, bilateral: Secondary | ICD-10-CM

## 2013-06-23 DIAGNOSIS — H9319 Tinnitus, unspecified ear: Secondary | ICD-10-CM

## 2013-06-23 DIAGNOSIS — K644 Residual hemorrhoidal skin tags: Secondary | ICD-10-CM

## 2013-06-23 DIAGNOSIS — R635 Abnormal weight gain: Secondary | ICD-10-CM

## 2013-06-23 DIAGNOSIS — M722 Plantar fascial fibromatosis: Secondary | ICD-10-CM | POA: Insufficient documentation

## 2013-06-23 DIAGNOSIS — I4891 Unspecified atrial fibrillation: Secondary | ICD-10-CM

## 2013-06-23 NOTE — Patient Instructions (Signed)
We will get you set up a consult with Dr. Hillis Range concerning your PAF and Jeanene Erb concerning the plantar fasciitis  Begin a caloric restricted diet cut back to about 1500 calories a day and drank about 24 ounces of water a day  Daily Xanax recommend is to taking the cholestyramine with something frozen or cold

## 2013-06-23 NOTE — Progress Notes (Signed)
  Subjective:    Patient ID: Harold Scott, male    DOB: 10/08/1960, 52 y.o.   MRN: [id number]  HPI Harold Scott is a 52 year old male married nonsmoker who comes in today for evaluation PAF, tinnitus, postcholecystectomy diarrhea, weight gain, and plantar fasciitis  The most important problem to him is the PAF. He says it occurs about once a month last from 6-24 hours. He can exercise because exercise triggers his PAF. He's gained about 30 pounds in the past year or so  He also has post cholecystectomy diarrhea and is on Questran. It works but he doesn't like the taste  Weight gain because of inactivity  New-onset plantar fasciitis right heel  Tinnitus constant,,,,,,,, triggered by noise exposure,,,,,,,,,,,,, he was a drummer   Review of Systems Review of systems negative he is completely off of caffeine    Objective:   Physical Exam  Well-developed overweight male no acute distress vital signs stable he is afebrile pulse 70 and regular today        Assessment & Plan:  PAF plan consult with Dr. Fayrene Fearing allred  Plantar fasciitis consult with Jeanene Erb at the orthopedic office  Postcholecystectomy diarrhea I don't think there is any other options besides Questran  Tinnitus  Weight gain discussed diet exercise and weight loss program

## 2013-06-29 ENCOUNTER — Ambulatory Visit: Payer: PRIVATE HEALTH INSURANCE | Admitting: Cardiovascular Disease

## 2013-07-05 ENCOUNTER — Other Ambulatory Visit: Payer: Self-pay | Admitting: Family Medicine

## 2013-07-06 ENCOUNTER — Telehealth: Payer: Self-pay | Admitting: Family Medicine

## 2013-07-06 NOTE — Telephone Encounter (Signed)
Rx called in to pharmacy. 

## 2013-07-06 NOTE — Telephone Encounter (Signed)
Pt states Fleet Contras was going to send in refill of all meds at ov last week.  However, rx for clorazepate (TRANXENE) 7.5 MG tablet was not sent and pt is completely out medication. Requesting refill [facility] to [facility] today.

## 2013-07-15 ENCOUNTER — Encounter: Payer: Self-pay | Admitting: Internal Medicine

## 2013-07-15 ENCOUNTER — Ambulatory Visit (INDEPENDENT_AMBULATORY_CARE_PROVIDER_SITE_OTHER): Payer: PRIVATE HEALTH INSURANCE | Admitting: Internal Medicine

## 2013-07-15 VITALS — BP 154/98 | HR 66 | Ht 68.0 in | Wt 280.4 lb

## 2013-07-15 DIAGNOSIS — G4733 Obstructive sleep apnea (adult) (pediatric): Secondary | ICD-10-CM

## 2013-07-15 DIAGNOSIS — E669 Obesity, unspecified: Secondary | ICD-10-CM

## 2013-07-15 DIAGNOSIS — I480 Paroxysmal atrial fibrillation: Secondary | ICD-10-CM

## 2013-07-15 DIAGNOSIS — G4730 Sleep apnea, unspecified: Secondary | ICD-10-CM

## 2013-07-15 DIAGNOSIS — I10 Essential (primary) hypertension: Secondary | ICD-10-CM

## 2013-07-15 DIAGNOSIS — I4891 Unspecified atrial fibrillation: Secondary | ICD-10-CM

## 2013-07-15 MED ORDER — LOSARTAN POTASSIUM 50 MG PO TABS: 50.0000 mg | ORAL_TABLET | Freq: Every day | ORAL | Status: DC

## 2013-07-15 MED ORDER — FLECAINIDE ACETATE 50 MG PO TABS: 50.0000 mg | ORAL_TABLET | Freq: Two times a day (BID) | ORAL | Status: DC

## 2013-07-15 NOTE — Patient Instructions (Signed)
Your physician recommends that you schedule a follow-up appointment in: 6 weeks with Dr [name]  Your physician has recommended you make the following change in your medication:  1) Start Losartan 50mg  daily 2) Start Flecainide 50mg  twice daily   You have been referred to Dr Vassie Loll  Your physician has requested that you have an echocardiogram. Echocardiography is a painless test that uses sound waves to create images of your heart. It provides your doctor with information about the size and shape of your heart and how well your heart's chambers and valves are working. This procedure takes approximately one hour. There are no restrictions for this procedure.

## 2013-07-15 NOTE — Progress Notes (Signed)
Primary Care Physician: [name], MD Primary Cardiologist: Charlton Haws, MD   Harold Scott is a 52 y.o. male with a h/o GERD, anxiety/depression, asthma, sleep apnea and atrial fibrillation.  He was first diagnosed with atrial fibrillation several years ago.  He is symptomatic with palpitations, shortness of breath, and fatigue.  He has been on Metoprolol for his atrial fibrillation.  He is not currently anticoagulated.  His episodes are increasing in frequency and severity.    Last echo in 09/2011 demonstrated an EF of 60-65% with focal basal hypertrophy, no valvular abnormalities and LA of 57.   Today, he denies symptoms of  chest pain, shortness of breath, orthopnea, PND, lower extremity edema, dizziness, presyncope, syncope, or neurologic sequela. The patient is tolerating medications without difficulties and is otherwise without complaint today.   Past Medical History  Diagnosis Date  . Anxiety   . Depression   . GERD (gastroesophageal reflux disease)   . Gallstones   . Asthma   . PONV (postoperative nausea and vomiting)   . Paroxysmal atrial fibrillation   . Obstructive sleep apnea     not compliant with CPAP   Past Surgical History  Procedure Laterality Date  . Hand surgery    . Tonsillectomy    . Wisdom tooth extraction    . Cholecystectomy  08/05/2012    Procedure: LAPAROSCOPIC CHOLECYSTECTOMY WITH INTRAOPERATIVE CHOLANGIOGRAM;  Surgeon: [name], MD,FACS;  Location: [facility] OR;  Service: General;  Laterality: N/A;    Current Outpatient Prescriptions  Medication Sig Dispense Refill  . albuterol (PROVENTIL HFA;VENTOLIN HFA) 108 (90 BASE) MCG/ACT inhaler Inhale 2 puffs into the lungs every 6 (six) hours as needed. Or shortness of breath      . Ascorbic Acid (VITAMIN C) 1000 MG tablet Take 1,000 mg by mouth daily.      Marland Kitchen aspirin 81 MG tablet Take 1 tablet (81 mg total) by mouth daily.  30 tablet  1  . cholestyramine (QUESTRAN) 4 GM/DOSE powder Take 1 packet (4 g  total) by mouth daily.  378 g  10  . citalopram (CELEXA) 10 MG tablet TAKE 1 TABLET (10 MG TOTAL) BY MOUTH DAILY.  30 tablet  0  . clorazepate (TRANXENE) 7.5 MG tablet TAKE 1 TABLET AT BEDTIME  90 tablet  1  . esomeprazole (NEXIUM) 40 MG capsule Take 40 mg by mouth 2 (two) times daily as needed. For acid reflux      . guaiFENesin (MUCINEX) 600 MG 12 hr tablet Take 1,200 mg by mouth 2 (two) times daily as needed. For cough      . ibuprofen (ADVIL,MOTRIN) 200 MG tablet Take 400 mg by mouth every 6 (six) hours as needed. For pain      . metoprolol succinate (TOPROL-XL) 50 MG 24 hr tablet Take 1 tablet (50 mg total) by mouth daily. Take with or immediately following a meal.  90 tablet  3  . vitamin B-12 (CYANOCOBALAMIN) 1000 MCG tablet Take 1,000 mcg by mouth daily.        . flecainide (TAMBOCOR) 50 MG tablet Take 1 tablet (50 mg total) by mouth 2 (two) times daily.  180 tablet  3  . losartan (COZAAR) 50 MG tablet Take 1 tablet (50 mg total) by mouth daily.  90 tablet  3   No current facility-administered medications for this visit.    Allergies  Allergen Reactions  . Penicillins Hives    History   Social History  . Marital Status: Married  Spouse Name: N/A    Number of Children: 1  . Years of Education: N/A   Occupational History  . Courier    Social History Main Topics  . Smoking status: Former Smoker    Quit date: 07/23/2006  . Smokeless tobacco: Never Used  . Alcohol Use: Yes     Comment: social  . Drug Use: No  . Sexual Activity: Not on file   Other Topics Concern  . Not on file   Social History Narrative   Pt works as a Immunologist    Family History  Problem Relation Age of Onset  . Adopted: Yes    ROS- All systems are reviewed and negative except as per the HPI above  Physical Exam: Filed Vitals:   07/15/13 1619  BP: 154/98  Pulse: 66  Height: 5\' 8"  (1.727 m)  Weight: 280 lb 6.4 oz (127.189 kg)    GEN- The patient is overweight appearing, alert and  oriented x 3 today.   Head- normocephalic, atraumatic Eyes-  Sclera clear, conjunctiva pink Ears- hearing intact Oropharynx- clear Neck- supple, no JVP Lymph- no cervical lymphadenopathy Lungs- Clear to ausculation bilaterally, normal work of breathing Heart- Regular rate and rhythm, no murmurs, rubs or gallops, PMI not laterally displaced GI- soft, NT, ND, + BS Extremities- no clubbing, cyanosis, or edema MS- no significant deformity or atrophy Skin- no rash or lesion Psych- euthymic mood, full affect Neuro- strength and sensation are intact  EKG- sinus rhythm rate of 66, incomplete RBBB, intervals .16/.10/.46  Assessment and Plan: 1.  Atrial fibrillation  The patient has symptomatic paroxysmal atrial fibrillation.  He has not tried AAD therapy.  Therapeutic strategies for afib including medicine and ablation were discussed in detail with the patient today.  At this point, I have advised AAD therapy.  We will start flecainide 50mg  BID today. His CHADS2VASc score is 1 (HTN).  Per guidelines, he is not anticoagulated at this time. Repeat echo to evaluate for structural changes.  2. HTN He has had several BP readings documented to be elevated.  I will start losartan for BP control today.  I have chosen losartan due to a decrease in left atrial scarring and a promoting of positive atrial remodeling with this medicine.  3. Obstructive sleep apnea I will refer to Dr Vassie Loll for management of sleep apnea  4. Severe left atrial enlargement Sleep apnea management and losartan as above Repeat echo to reassess at this time  5. Obesity Weight loss is advised

## 2013-07-16 ENCOUNTER — Other Ambulatory Visit: Payer: Self-pay | Admitting: Family Medicine

## 2013-07-17 ENCOUNTER — Other Ambulatory Visit: Payer: Self-pay | Admitting: *Deleted

## 2013-07-17 MED ORDER — ESOMEPRAZOLE MAGNESIUM 40 MG PO CPDR: 40.0000 mg | DELAYED_RELEASE_CAPSULE | Freq: Two times a day (BID) | ORAL | Status: DC | PRN

## 2013-07-17 MED ORDER — CHOLESTYRAMINE 4 GM/DOSE PO POWD: 4.0000 g | Freq: Every day | ORAL | Status: DC

## 2013-07-17 MED ORDER — METOPROLOL SUCCINATE ER 50 MG PO TB24: 50.0000 mg | ORAL_TABLET | Freq: Every day | ORAL | Status: DC

## 2013-07-17 MED ORDER — CLORAZEPATE DIPOTASSIUM 7.5 MG PO TABS: ORAL_TABLET | ORAL | Status: DC

## 2013-07-17 MED ORDER — CITALOPRAM HYDROBROMIDE 10 MG PO TABS: 10.0000 mg | ORAL_TABLET | Freq: Every day | ORAL | Status: DC

## 2013-07-20 ENCOUNTER — Telehealth: Payer: Self-pay | Admitting: Internal Medicine

## 2013-07-20 NOTE — Telephone Encounter (Signed)
Discussed with Dr [name] will increase Flecainide to 100mg  bid and keep his follow up appointment

## 2013-07-20 NOTE — Telephone Encounter (Signed)
New Problem  Pt state he was placed on flecainide for AFIB// However since Wednesday he has been in AFIB twice// he request a call back to discuss if it is normal to still be in AFIB "Mildly" while taking the medication// Please advise.

## 2013-07-20 NOTE — Telephone Encounter (Signed)
Patient aware and agrees with plan.  He feels like it is trying to convert now but admits to over eating and drinking for Thanksgiving.

## 2013-07-21 ENCOUNTER — Telehealth: Payer: Self-pay | Admitting: Internal Medicine

## 2013-07-21 NOTE — Telephone Encounter (Signed)
Spoke with patient he is feeling better now.  He does not know if it is the medication or not.  His afib is better.  I have asked him to try and stick out the increased dose as it has helped and is currently in NSR.  His referral for pulmonary is in and he is on a waiting list for a sooner appointment

## 2013-07-21 NOTE — Telephone Encounter (Signed)
New Problem:  Pt states his flecanide is making him very weak and tired but his a-fib has stopped. Pt is also wanting to know the status of his cpap referral. Pt states Dr. [name] was going to set him up with another doctor.

## 2013-07-28 ENCOUNTER — Encounter: Payer: Self-pay | Admitting: Family Medicine

## 2013-07-28 ENCOUNTER — Telehealth: Payer: Self-pay | Admitting: Family Medicine

## 2013-07-28 ENCOUNTER — Ambulatory Visit (INDEPENDENT_AMBULATORY_CARE_PROVIDER_SITE_OTHER): Payer: PRIVATE HEALTH INSURANCE | Admitting: Family Medicine

## 2013-07-28 VITALS — BP 120/90 | HR 75 | Temp 98.3°F | Wt 288.0 lb

## 2013-07-28 DIAGNOSIS — J45901 Unspecified asthma with (acute) exacerbation: Secondary | ICD-10-CM | POA: Insufficient documentation

## 2013-07-28 MED ORDER — PREDNISONE 20 MG PO TABS: ORAL_TABLET | ORAL | Status: DC

## 2013-07-28 NOTE — Telephone Encounter (Signed)
Per Dr Tawanna Cooler - office visit - patient is aware

## 2013-07-28 NOTE — Progress Notes (Signed)
Pre visit review using our clinic review tool, if applicable. No additional management support is needed unless otherwise documented below in the visit note. 

## 2013-07-28 NOTE — Patient Instructions (Signed)
Prednisone 20 mg,,,,,,,,, 3 now then starting tomorrow morning 2 tabs every morning for 3 days or until you feel a whole better and then begin to taper as outlined,

## 2013-07-28 NOTE — Progress Notes (Signed)
   Subjective:    Patient ID: Harold Scott, male    DOB: 02-11-1961, 52 y.o.   MRN: [id number]  HPI Harold Scott is a 52 year old married male nonsmoker who comes in today for evaluation of asthma  He's always had a history of allergic rhinitis and mild asthma for which she's used some albuterol when necessary. A week ago he week using the blower to clean off the leaves and developed wheezing. Since that time he's been using albuterol 2 puffs 3 or 4 times daily.   Review of Systems Review of systems otherwise negative    Objective:   Physical Exam  Well-developed well-nourished male no acute distress vital signs stable he is afebrile pulse ox 92 on room air respiratory rate 12 and unlabored  HEENT negative neck was supple no adenopathy lungs showed inspiratory and expiratory mild wheezing      Assessment & Plan:  Asthma plan prednisone burst and taper

## 2013-07-28 NOTE — Telephone Encounter (Signed)
Pt is having issues w/ his albuterol (PROVENTIL HFA;VENTOLIN HFA) 108 (90 BASE) MCG/ACT inhaler is not working.  Pt is sob.wheezing   Would like to see dr todd .Send call to [facility]

## 2013-07-28 NOTE — Telephone Encounter (Signed)
Patient Information:  Caller Name: [name]  Phone: [phone]  Patient: Harold Scott, Harold Scott  Gender: Male  DOB: 01/09/1961  Age: 52 Years  PCP: Kelle Darting [facility])  Office Follow Up:  Does the office need to follow up with this patient?: Yes  Instructions For The Office: Pt needs an appt to be seen today. SOB with exertion.   Symptoms  Reason For Call & Symptoms: Pt calling that he is SOB that started  07/26/13.   Using Albuterol is helping his wheezing.   Gets SOB with any exertion.   He feels fine other than this SOB.  Reviewed Health History In EMR: Yes  Reviewed Medications In EMR: Yes  Reviewed Allergies In EMR: Yes  Reviewed Surgeries / Procedures: Yes  Date of Onset of Symptoms: Unknown  Treatments Tried: Albuterol tx's for the wheezing.  Treatments Tried Worked: No  Guideline(s) Used:  Breathing Difficulty  Disposition Per Guideline:   Go to Office Now  Reason For Disposition Reached:   Mild difficulty breathing (e.g., minimal/no SOB at rest, SOB with walking, pulse < 100) of new onset or worse than normal  Advice Given:  General Care Advice for Breathing Difficulty:  Find position of greatest comfort. For most patients the best position is semi-upright (e.g., sitting up in a comfortable chair or lying back against pillows).  Elevate head of bed (e.g., use pillows or place blocks under bed).  Avoid smoke or fume exposure.  Create a draft (e.g., use a fan directed at the face, or open a window).  Keep room temperature slightly on the cool side.  Limit activities or space activities apart during the day. Prioritize activities.  Patient Will Follow Care Advice:  YES

## 2013-07-28 NOTE — Telephone Encounter (Signed)
Patient is using albuterol 2 times every 6 hours.  Would like to know if he should increase his albuterol or start prednisone?  Please advise

## 2013-07-31 ENCOUNTER — Other Ambulatory Visit: Payer: Self-pay

## 2013-07-31 MED ORDER — FLECAINIDE ACETATE 100 MG PO TABS: ORAL_TABLET | ORAL | Status: DC

## 2013-07-31 MED ORDER — FLECAINIDE ACETATE 100 MG PO TABS: 100.0000 mg | ORAL_TABLET | Freq: Two times a day (BID) | ORAL | Status: DC

## 2013-08-01 ENCOUNTER — Other Ambulatory Visit: Payer: Self-pay | Admitting: Cardiovascular Disease

## 2013-08-03 ENCOUNTER — Telehealth: Payer: Self-pay | Admitting: Internal Medicine

## 2013-08-03 NOTE — Telephone Encounter (Signed)
New problem    Pt has questions about his FLECAINIDE?  He is having SOB, muscle weakness, disoriented and wheezing,pluse some A fib episodes,  pt needs a call back.

## 2013-08-03 NOTE — Telephone Encounter (Signed)
Pt called again today with C/O of SOB,weak and wheezing; pt states that even with flecainide he is still in A-fib . Pt would like to know if he can stop taking this medication. Pt was offered an appointment with [facility] tomorrow at 11 am or 2:30 PM. Pt states he can't make it because he is coming from [place].  Dr. [name] aware of pt's symptoms and recommends to stop Flecainide 100 mg if he thinks that will make feel better, and to make  An appointment to see [facility] in a couple a day. Pt has an appointment on 08/11/13 the next appointment. Pt only time he can make it at 3:30 PM pt aware.

## 2013-08-06 ENCOUNTER — Ambulatory Visit (INDEPENDENT_AMBULATORY_CARE_PROVIDER_SITE_OTHER): Payer: PRIVATE HEALTH INSURANCE | Admitting: Family Medicine

## 2013-08-06 ENCOUNTER — Encounter: Payer: Self-pay | Admitting: Family Medicine

## 2013-08-06 DIAGNOSIS — D2330 Other benign neoplasm of skin of unspecified part of face: Secondary | ICD-10-CM

## 2013-08-06 DIAGNOSIS — L989 Disorder of the skin and subcutaneous tissue, unspecified: Secondary | ICD-10-CM

## 2013-08-06 NOTE — Patient Instructions (Signed)
10 when necessary  Sometime in the next 2 weeks Fleet Contras I will call you the report

## 2013-08-06 NOTE — Progress Notes (Signed)
   Subjective:    Patient ID: Harold Scott, male    DOB: June 17, 1961, 52 y.o.   MRN: [id number]  HPI and is a 52 year old male who comes in today for removal of 3 lesions on his face and skin tags  Lesion #1 is 6 mm x 6 mm left of his left eye  Lesion #2 6 mm x 6 mm to the right of the right eye  Lesion #3 is 8 mm x 8 mm right cheek    All 3 lesions were anesthetized with 1% Xylocaine with epinephrine after an alcohol prep an informed consent. The lesions were removed bases were cauterized Band-Aids were applied. The lesions were sent for pathologic analysis.  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

## 2013-08-10 ENCOUNTER — Encounter: Payer: Self-pay | Admitting: Cardiology

## 2013-08-10 ENCOUNTER — Ambulatory Visit (HOSPITAL_COMMUNITY): Payer: PRIVATE HEALTH INSURANCE | Attending: Internal Medicine | Admitting: Radiology

## 2013-08-10 DIAGNOSIS — I4891 Unspecified atrial fibrillation: Secondary | ICD-10-CM | POA: Insufficient documentation

## 2013-08-10 NOTE — Progress Notes (Signed)
Echocardiogram performed.  

## 2013-08-11 ENCOUNTER — Encounter: Payer: Self-pay | Admitting: Cardiology

## 2013-08-11 ENCOUNTER — Ambulatory Visit (INDEPENDENT_AMBULATORY_CARE_PROVIDER_SITE_OTHER): Payer: PRIVATE HEALTH INSURANCE | Admitting: Cardiology

## 2013-08-11 VITALS — BP 148/94 | HR 69 | Ht 68.0 in | Wt 284.0 lb

## 2013-08-11 DIAGNOSIS — I4891 Unspecified atrial fibrillation: Secondary | ICD-10-CM

## 2013-08-11 DIAGNOSIS — I10 Essential (primary) hypertension: Secondary | ICD-10-CM

## 2013-08-11 NOTE — Patient Instructions (Signed)
Your physician recommends that you schedule a follow-up appointment in: DR. APPOINTMENT WITH DR. [name]  Your physician recommends that you continue on your current medications as directed. Please refer to the Current Medication list given to you today.

## 2013-08-19 NOTE — Progress Notes (Signed)
Patient ID: Harold Scott MRN: [id number], DOB/AGE: 1961-06-26   Date of Visit: 08/11/2013  Primary Physician: [name], MD Primary EP: [name], MD Reason for Visit: Dizziness  History of Present Illness  Harold Scott is a 52 y.o. male with PAF, normal LVEF, GERD, anxiety/depression, asthma and obstructive sleep apnea. He was first diagnosed with atrial fibrillation several years ago. He is symptomatic with palpitations, shortness of breath and fatigue. He has been on Metoprolol for his atrial fibrillation. He is not currently anticoagulated as his CHADS2-VASc score is low. At his last visit with Dr. [name] on 11/26/2014he reported his AF episodes were increasing in frequency and severity. Flecainide was added.   Last echo in 09/2011 demonstrated an EF of 60-65% with focal basal hypertrophy, no valvular abnormalities and LA of 57.   Today, he has been added on to clinic for dizziness. He reports persistent dizziness and increased palpitations while on flecainide. He discontinued this medication on his own with improvement in dizziness. He no longer wants to take this medication. He denies chest pain or shortness of breath. He denies palpitations, dizziness, near syncope or syncope. He denies LE swelling, orthopnea or PND. He reports compliance with CPAP.  Past Medical History Past Medical History  Diagnosis Date  . Anxiety   . Depression   . GERD (gastroesophageal reflux disease)   . Gallstones   . Asthma   . PONV (postoperative nausea and vomiting)   . Paroxysmal atrial fibrillation   . Obstructive sleep apnea     not compliant with CPAP    Past Surgical History Past Surgical History  Procedure Laterality Date  . Hand surgery    . Tonsillectomy    . Wisdom tooth extraction    . Cholecystectomy  08/05/2012    Procedure: LAPAROSCOPIC CHOLECYSTECTOMY WITH INTRAOPERATIVE CHOLANGIOGRAM;  Surgeon: [name], MD,FACS;  Location: [facility] OR;  Service: General;  Laterality:  Harold Scott;    Allergies/Intolerances Allergies  Allergen Reactions  . Penicillins Hives    Current Home Medications Current Outpatient Prescriptions  Medication Sig Dispense Refill  . aspirin 81 MG tablet Take 1 tablet (81 mg total) by mouth daily.  30 tablet  1  . cholestyramine (QUESTRAN) 4 GM/DOSE powder Take 1 packet (4 g total) by mouth daily.  378 g  10  . citalopram (CELEXA) 10 MG tablet Take 1 tablet (10 mg total) by mouth daily.  90 tablet  2  . clorazepate (TRANXENE) 7.5 MG tablet TAKE 1 TABLET AT BEDTIME  90 tablet  2  . esomeprazole (NEXIUM) 40 MG capsule Take 1 capsule (40 mg total) by mouth 2 (two) times daily as needed. For acid reflux  180 capsule  2  . guaiFENesin (MUCINEX) 600 MG 12 hr tablet Take 1,200 mg by mouth 2 (two) times daily as needed. For cough      . ibuprofen (ADVIL,MOTRIN) 200 MG tablet Take 400 mg by mouth every 6 (six) hours as needed. For pain      . losartan (COZAAR) 50 MG tablet Take 1 tablet (50 mg total) by mouth daily.  90 tablet  3  . metoprolol succinate (TOPROL-XL) 50 MG 24 hr tablet TAKE 1 TABLET (50 MG TOTAL) BY MOUTH DAILY.  30 tablet  3  . albuterol (PROVENTIL HFA;VENTOLIN HFA) 108 (90 BASE) MCG/ACT inhaler Inhale 2 puffs into the lungs every 6 (six) hours as needed. Or shortness of breath       No current facility-administered medications for this visit.  Social History History   Social History  . Marital Status: Married    Spouse Name: Harold Scott    Number of Children: 1  . Years of Education: Harold Scott   Occupational History  . Courier    Social History Main Topics  . Smoking status: Former Smoker    Quit date: 07/23/2006  . Smokeless tobacco: Never Used  . Alcohol Use: Yes     Comment: social  . Drug Use: No  . Sexual Activity: Not on file   Other Topics Concern  . Not on file   Social History Narrative   Pt works as a Ecologist: No chills, fever, night sweats or weight changes Cardiovascular: No  chest pain, dyspnea on exertion, edema, orthopnea, palpitations, paroxysmal nocturnal dyspnea Dermatological: No rash, lesions or masses Respiratory: No cough, dyspnea Urologic: No hematuria, dysuria Abdominal: No nausea, vomiting, diarrhea, bright red blood per rectum, melena, or hematemesis Neurologic: No visual changes, weakness, changes in mental status All other systems reviewed and are otherwise negative except as noted above.  Physical Exam Vitals: Blood pressure 148/94, pulse 69, height 5\' 8"  (1.727 m), weight 284 lb (128.822 kg), SpO2 98.00%.  General: Well developed, well appearing 52 y.o. male in no acute distress. HEENT: Normocephalic, atraumatic. EOMs intact. Sclera nonicteric. Oropharynx clear.  Neck: Supple. No JVD. Lungs: Respirations regular and unlabored, CTA bilaterally. No wheezes, rales or rhonchi. Heart: RRR. S1, S2 present. No murmurs, rub, S3 or S4. Abdomen: Soft, non-distended.  Extremities: No clubbing, cyanosis or edema. PT/Radials 2+ and equal bilaterally. Psych: Normal affect. Neuro: Alert and oriented X 3. Moves all extremities spontaneously.   Diagnostics Echocardiogram 08/10/2013 Study Conclusions - Left ventricle: The cavity size was normal. Wall thickness was increased in a pattern of moderate LVH. There was mild focal basal hypertrophy of the septum. Systolic function was vigorous. The estimated ejection fraction was in the range of 65% to 70%. Wall motion was normal; there were no regional wall motion abnormalities. Features are consistent with a pseudonormal left ventricular filling pattern, with concomitant abnormal relaxation and increased filling pressure (grade 2 diastolic dysfunction). - Mitral valve: Mild regurgitation. - Left atrium: The atrium was moderately to severely dilated. 12-lead ECG today - NSR at 61 bpm with incomplete RBBB; no ST-T wave abnormalities; PR 166, QRS 104 (same as previous ECG Nov 2014), QT/QTc  466/469  Assessment and Plan 1. Atrial fibrillation - in SR today - did not tolerate flecainide so discontinued due to dizziness - treatment options again reviewed, specifically other options for AAD therapy; he is not interested in AAD therapy at this time; Dr. [name] previously discussed ablation with him however it was recommended he try AAD therapy prior to consideration for ablation which he is not interested in at this time - CHADS2-VASc score is 1 and per guidelines, he is not anticoagulated at this time - continue BB for rate control - he will follow-up with Dr. [name] as previously scheduled next month  Signed, Shivaay Stormont, PA-C 08/19/2013, 11:13 AM

## 2013-08-24 ENCOUNTER — Other Ambulatory Visit: Payer: Self-pay | Admitting: Cardiovascular Disease

## 2013-08-24 ENCOUNTER — Other Ambulatory Visit: Payer: Self-pay | Admitting: *Deleted

## 2013-08-24 MED ORDER — METOPROLOL SUCCINATE ER 50 MG PO TB24: ORAL_TABLET | ORAL | Status: DC

## 2013-08-24 NOTE — Telephone Encounter (Signed)
rx sent to pharmacy

## 2013-08-31 ENCOUNTER — Institutional Professional Consult (permissible substitution): Payer: PRIVATE HEALTH INSURANCE | Admitting: Pulmonary Disease

## 2013-09-01 ENCOUNTER — Ambulatory Visit (INDEPENDENT_AMBULATORY_CARE_PROVIDER_SITE_OTHER): Payer: PRIVATE HEALTH INSURANCE | Admitting: Pulmonary Disease

## 2013-09-01 ENCOUNTER — Encounter: Payer: Self-pay | Admitting: Pulmonary Disease

## 2013-09-01 VITALS — BP 132/84 | HR 71 | Temp 98.3°F | Ht 68.0 in | Wt 289.2 lb

## 2013-09-01 DIAGNOSIS — G4733 Obstructive sleep apnea (adult) (pediatric): Secondary | ICD-10-CM

## 2013-09-01 NOTE — Patient Instructions (Signed)
Will start you on cpap at a moderate pressure level.  Please call if having issues with pressure tolerance.  Work on weight loss followup with me in 8 weeks.

## 2013-09-01 NOTE — Assessment & Plan Note (Signed)
The patient has a history of mild to moderate obstructive sleep apnea from his sleep study in 2014, and his quality of life is clearly being impacted based on his history. It had a long discussion with him about sleep apnea, including its impact to his quality of life and cardiovascular health. Given its impact to his sleep and daytime alertness, I have recommended that he treat this aggressively with CPAP. The patient is agreeable to this approach.  I will set the patient up on cpap at a moderate pressure level to allow for desensitization, and will troubleshoot the device over the next 4-6weeks if needed.  The pt is to call me if having issues with tolerance.  Will then optimize the pressure once patient is able to wear cpap on a consistent basis.

## 2013-09-01 NOTE — Progress Notes (Signed)
Subjective:    Patient ID: Harold Scott, male    DOB: [birth year], 53 y.o.   MRN: [id number]  HPI The patient is a 53 year old male who I've been asked to see for management of obstructive sleep apnea. He underwent a sleep study in April of last year which showed mild to moderate OSA, with an AHI of 17 events per hour. He was started on CPAP as part of a split night study, and found to have an optimal pressure around 11 cm of water. The patient states he has been told by his wife that he has loud snoring, but she has never mentioned an abnormal breathing pattern during sleep. He has frequent awakenings at night, and is never rested in the mornings upon arising. He notes significant daytime sleepiness with inactivity, and will take one to 2 naps every day. He also falls asleep easily in the evenings watching television or movies. He also notes sleep pressure driving longer distances. The patient states that his weight is up 85 pounds over the last 2 years.   Sleep Questionnaire What time do you typically go to bed?( Between what hours) 9:00 p.m 9:00 p.m at 1548 on 09/01/13 by Lu Duffel How long does it take you to fall asleep? 15 minutes 15 minutes at 1548 on 09/01/13 by Lu Duffel How many times during the night do you wake up? 3 3 at 1548 on 09/01/13 by Lu Duffel What time do you get out of bed to start your day? 0600 0600 at 1548 on 09/01/13 by Lu Duffel Do you drive or operate heavy machinery in your occupation? Yes Yes at 1548 on 09/01/13 by Lu Duffel How much has your weight changed (up or down) over the past two years? (In pounds) 85 lb (38.556 kg) 85 lb (38.556 kg) at 1548 on 09/01/13 by Lu Duffel Have you ever had a sleep study before? Yes Yes at 1548 on 09/01/13 by Lu Duffel If yes, location of study? [place] at 2725 on 09/01/13 by Lu Duffel If yes, date of study? Dec 30, 2012 Dec 30, 2012 at  1548 on 09/01/13 by Lu Duffel Do you currently use CPAP? No No at 1548 on 09/01/13 by Lu Duffel Do you wear oxygen at any time? No No at 1548 on 09/01/13 by Lu Duffel   Review of Systems  Constitutional: Negative for fever and unexpected weight change.  HENT: Positive for congestion and postnasal drip. Negative for dental problem, ear pain, nosebleeds, rhinorrhea, sinus pressure, sneezing, sore throat and trouble swallowing.   Eyes: Negative for redness and itching.  Respiratory: Positive for shortness of breath and wheezing. Negative for cough and chest tightness.   Cardiovascular: Positive for palpitations. Negative for leg swelling.       Afib  Gastrointestinal: Negative for nausea and vomiting.  Genitourinary: Negative for dysuria.  Musculoskeletal: Negative for joint swelling.  Skin: Negative for rash.  Neurological: Negative for headaches.  Hematological: Does not bruise/bleed easily.  Psychiatric/Behavioral: Positive for dysphoric mood. The patient is nervous/anxious.        Objective:   Physical Exam Constitutional:  Obese male, no acute distress  HENT:  Nares patent without discharge  Oropharynx without exudate, palate and uvula are thick and elongated.  Small posterior space.   Eyes:  Perrla, eomi, no scleral icterus  Neck:  No JVD, no TMG  Cardiovascular:  Normal rate, sounds regular rhythm, no rubs  or gallops.  No murmurs        Intact distal pulses  Pulmonary :  Normal breath sounds, no stridor or respiratory distress   No rales, rhonchi, or wheezing  Abdominal:  Soft, nondistended, bowel sounds present.  No tenderness noted.   Musculoskeletal:  mild lower extremity edema noted.  Lymph Nodes:  No cervical lymphadenopathy noted  Skin:  No cyanosis noted  Neurologic:  Alert, appropriate, moves all 4 extremities without obvious deficit.         Assessment & Plan:

## 2013-09-08 ENCOUNTER — Telehealth: Payer: Self-pay | Admitting: Pulmonary Disease

## 2013-09-08 NOTE — Telephone Encounter (Signed)
Spoke with pt. He reports [facility] advised him they have not been able to get an answer from his insurance regarding his CPAP. Was advised he needed to call his insurance. He reports his wife did call his insurance and was told we needed to call them. It sounded like it needed some type of authorization. Pt has [employer] insurance. Insurance card scanned in pt chart. Please advise PCC's if you guys can help assist in this matter. thanks

## 2013-09-09 ENCOUNTER — Ambulatory Visit (INDEPENDENT_AMBULATORY_CARE_PROVIDER_SITE_OTHER): Payer: PRIVATE HEALTH INSURANCE | Admitting: Internal Medicine

## 2013-09-09 ENCOUNTER — Encounter: Payer: Self-pay | Admitting: Internal Medicine

## 2013-09-09 ENCOUNTER — Other Ambulatory Visit: Payer: Self-pay | Admitting: Pulmonary Disease

## 2013-09-09 VITALS — BP 142/90 | HR 70 | Ht 67.0 in | Wt 286.0 lb

## 2013-09-09 DIAGNOSIS — E669 Obesity, unspecified: Secondary | ICD-10-CM

## 2013-09-09 DIAGNOSIS — I10 Essential (primary) hypertension: Secondary | ICD-10-CM

## 2013-09-09 DIAGNOSIS — I4891 Unspecified atrial fibrillation: Secondary | ICD-10-CM

## 2013-09-09 DIAGNOSIS — I480 Paroxysmal atrial fibrillation: Secondary | ICD-10-CM

## 2013-09-09 DIAGNOSIS — G4733 Obstructive sleep apnea (adult) (pediatric): Secondary | ICD-10-CM

## 2013-09-09 MED ORDER — DRONEDARONE HCL 400 MG PO TABS: 400.0000 mg | ORAL_TABLET | Freq: Two times a day (BID) | ORAL | Status: DC

## 2013-09-09 NOTE — Progress Notes (Signed)
PCP:  [name], MD Primary Cardiologist:  Dr Johnsie Cancel  The patient presents today for routine electrophysiology followup.  Since last being seen in our clinic, the patient reports doing reasonably well.  He did not tolerate flecainide due to worsening SOB.  He continues to have afib about once every 2-3 weeks, lasting 6-12 hours.  He has been diagnosed with OSA but has not been able to get CPAP initiated.  He is working with Dr Gwenette Greet on this. He reports poor exercise tolerance and difficulty losing weight chronically.   Today, he denies symptoms of palpitations, chest pain, shortness of breath, orthopnea, PND, lower extremity edema, dizziness, presyncope, syncope, or neurologic sequela.  The patient feels that he is tolerating medications without difficulties and is otherwise without complaint today.   Past Medical History  Diagnosis Date  . Anxiety   . Depression   . GERD (gastroesophageal reflux disease)   . Gallstones   . Asthma   . PONV (postoperative nausea and vomiting)   . Paroxysmal atrial fibrillation   . Obstructive sleep apnea     not compliant with CPAP   Past Surgical History  Procedure Laterality Date  . Hand surgery    . Tonsillectomy    . Wisdom tooth extraction    . Cholecystectomy  08/05/2012    Procedure: LAPAROSCOPIC CHOLECYSTECTOMY WITH INTRAOPERATIVE CHOLANGIOGRAM;  Surgeon: [name], MD,FACS;  Location: [place];  Service: General;  Laterality: N/A;    Current Outpatient Prescriptions  Medication Sig Dispense Refill  . albuterol (PROVENTIL HFA;VENTOLIN HFA) 108 (90 BASE) MCG/ACT inhaler Inhale 2 puffs into the lungs every 6 (six) hours as needed. Or shortness of breath      . aspirin 81 MG tablet Take 1 tablet (81 mg total) by mouth daily.  30 tablet  1  . cholestyramine (QUESTRAN) 4 GM/DOSE powder Take 1 packet (4 g total) by mouth daily.  378 g  10  . citalopram (CELEXA) 10 MG tablet Take 1 tablet (10 mg total) by mouth daily.  90 tablet  2  .  clorazepate (TRANXENE) 7.5 MG tablet TAKE 1 TABLET AT BEDTIME  90 tablet  2  . esomeprazole (NEXIUM) 40 MG capsule Take 1 capsule (40 mg total) by mouth 2 (two) times daily as needed. For acid reflux  180 capsule  2  . guaiFENesin (MUCINEX) 600 MG 12 hr tablet Take 1,200 mg by mouth 2 (two) times daily as needed. For cough      . ibuprofen (ADVIL,MOTRIN) 200 MG tablet Take 400 mg by mouth every 6 (six) hours as needed. For pain      . losartan (COZAAR) 50 MG tablet Take 1 tablet (50 mg total) by mouth daily.  90 tablet  3  . metoprolol succinate (TOPROL-XL) 50 MG 24 hr tablet TAKE 1 TABLET (50 MG TOTAL) BY MOUTH DAILY.  100 tablet  1  . dronedarone (MULTAQ) 400 MG tablet Take 1 tablet (400 mg total) by mouth 2 (two) times daily with a meal.  60 tablet  3   No current facility-administered medications for this visit.    Allergies  Allergen Reactions  . Flecainide Other (See Comments)    EXTREME SOB  . Penicillins Hives    History   Social History  . Marital Status: Married    Spouse Name: N/A    Number of Children: 1  . Years of Education: N/A   Occupational History  . Courier    Social History Main Topics  .  Smoking status: Former Smoker -- 0.50 packs/day for 30 years    Types: Cigarettes    Quit date: 07/23/2006  . Smokeless tobacco: Never Used  . Alcohol Use: Yes     Comment: social  . Drug Use: No  . Sexual Activity: Not on file   Other Topics Concern  . Not on file   Social History Narrative   Pt works as a Secondary school teacher    Family History  Problem Relation Age of Onset  . Adopted: Yes    ROS-  All systems are reviewed and are negative except as outlined in the HPI above  Physical Exam: Filed Vitals:   09/09/13 1623  BP: 142/90  Pulse: 70  Height: 5\' 7"  (1.702 m)  Weight: 286 lb (129.729 kg)    GEN- The patient is overweight appearing, alert and oriented x 3 today.   Head- normocephalic, atraumatic Eyes-  Sclera clear, conjunctiva pink Ears- hearing  intact Oropharynx- clear Neck- supple, no JVP Lymph- no cervical lymphadenopathy Lungs- Clear to ausculation bilaterally, normal work of breathing Heart- Regular rate and rhythm, no murmurs, rubs or gallops, PMI not laterally displaced GI- soft, NT, ND, + BS Extremities- no clubbing, cyanosis, or edema MS- no significant deformity or atrophy Skin- no rash or lesion Psych- euthymic mood, full affect Neuro- strength and sensation are intact  ekg today reveals sinus rhythm, R atrial enlargement  Assessment and Plan:   Assessment and Plan: 1.  Atrial fibrillation  The patient has symptomatic paroxysmal atrial fibrillation.  He has failed medical therapy with flecainide.  Therapeutic strategies for afib including medicine and ablation were discussed in detail with the patient today.  At this point, I have advised further AAD therapy.  We will start Multaq 400mg  BID.  His CHADS2VASc score is 1 (HTN).  Per guidelines, he is not anticoagulated at this time. Repeat echo is reviewed.  Lifestyle modification is encouraged. I am reluctant to consider ablation given his severe LA enlargement, though once therapy for OSA has been initiated this may be more reasonable.  2. HTN Stable No change required today Repeat bmet and LFTs upon return  3. Obstructive sleep apnea Followed with Dr Gwenette Greet.  I have spoken with Dr Gwenette Greet today who is working to help the patient with insurance issues related to CPAP.  4. Severe left atrial enlargement Sleep apnea management and losartan as above  5. Obesity Weight loss is advised  Return in 3 months for further assessment.

## 2013-09-09 NOTE — Patient Instructions (Signed)
Your physician recommends that you schedule a follow-up appointment in: 2 months with Dr [name]   Your physician has recommended you make the following change in your medication:  1) Start Multaq 400mg  twice daily

## 2013-09-10 NOTE — Telephone Encounter (Signed)
Spoke with Rodena Piety at [facility] this morning. She stated that the prior authorization with [employer] (pt's insurance) has been submitted by [facility]. Benefits have been given to patient and he is aware of this. Per Rodena Piety at [facility], order has been given to RT to schedule patient's CPAP set up appointment.  Called and spoke with patient and he states that he was contacted by [facility] and given the amount that he would need to pay at set up and he agreed, and his understanding is that [facility] will contact him to set the CPAP up this week. Advised patient that if he doesn't hear from [facility] by today with an appointment, to call me back Friday morning. That I would leave this phone message open to make sure he has been taken care of with an appointment for set up. Gave patient my direct phone number to contact me back tomorrow morning for me to follow up if he has not been contact by [facility] for an appointment. Rhonda J Cobb

## 2013-09-11 NOTE — Telephone Encounter (Signed)
Pt returned my call and was set up today at 3:30 by Iona Beard at [facility]. Pt advised to contact us if he had any problems with cpap tolerance or any other issues. Pt thank [place] for all our efforts. Rhonda J Cobb

## 2013-09-24 ENCOUNTER — Telehealth: Payer: Self-pay | Admitting: *Deleted

## 2013-09-24 NOTE — Telephone Encounter (Signed)
Patient would like to know if he can increase his cholestyramine one pack a day.  Is this okay to fill?

## 2013-09-25 MED ORDER — CHOLESTYRAMINE 4 GM/DOSE PO POWD: ORAL | Status: DC

## 2013-09-25 NOTE — Telephone Encounter (Signed)
Rx sent in and okay per Dr Sherren Mocha

## 2013-10-08 ENCOUNTER — Telehealth: Payer: Self-pay | Admitting: Internal Medicine

## 2013-10-08 NOTE — Telephone Encounter (Signed)
Offered patient an appointment tomorrow to see the PA as Dr [name] is in the office.  He can not come until after 3:30 due to work.  We have scheduled him for next week and I will discuss the symptoms with Dr [name] tomorrow

## 2013-10-08 NOTE — Telephone Encounter (Signed)
Patient states he feels he is having untoward reaction to the Multaq.  He has increased SOB upon exertion and is unable to walk more than 150 ft without totally getting out of breath.  He has increased swelling to hands/feet/calves and he says it is very uncomfortable.  He denies CP. States the symptoms started once he began new medication of Multaq. Patient wants to stop medication or decrease dose to see if his symptoms improve. States he can not keep on like this because it is getting worse the longer it goes on. He is also having breakthrough AFib episodes lasting 8-12 hours at a time - approximately 4-5 episodes over past 7-10 days, although he says the episodes are minor and not really symptomatic like before when he had them.

## 2013-10-08 NOTE — Telephone Encounter (Signed)
New message     C/o sob on exertion, legs/feet/hands swelling--no chest pain.  These symptoms began when he started multax on last visit.  Pt says he is "out of shape" also.  He says he cannot live like this.  Pt want to stop multax or decrease dosage.

## 2013-10-09 ENCOUNTER — Ambulatory Visit: Payer: PRIVATE HEALTH INSURANCE | Admitting: Physician Assistant

## 2013-10-11 NOTE — Telephone Encounter (Signed)
Please stop multaq now. Obtain bmet, tsh Follow-up with Harold Scott 2/25 to see if he needs diuresis.  Follow-up with me as scheduled in early march. His only drug option at this point would be tikosyn.  Given severe LA enlargement, we may consider surgical evaluation by Harold Scott for MAZE procedure. I would like to discuss this with the patient when I see him in early March.

## 2013-10-14 ENCOUNTER — Ambulatory Visit: Payer: PRIVATE HEALTH INSURANCE | Admitting: Physician Assistant

## 2013-10-15 NOTE — Telephone Encounter (Signed)
Spoke with patient and he decreased his Multaq on his own.  The symptoms are still present.  His weight is down 6-7 pounds and his SOB is mainly coming from walking up hills he is not accustomed to due to weather conditions and the way he has to get to his car due to the conditions. I explained to him Dr Jackalyn Lombard recommendations and he is going to keep his follow up as scheduled with PA and Dr [name].  With the changes in schedules due to weather not sure both are necessary.  I will call him tomorrow and see how he is doing

## 2013-10-21 ENCOUNTER — Ambulatory Visit (INDEPENDENT_AMBULATORY_CARE_PROVIDER_SITE_OTHER): Payer: PRIVATE HEALTH INSURANCE | Admitting: Physician Assistant

## 2013-10-21 ENCOUNTER — Encounter: Payer: Self-pay | Admitting: Physician Assistant

## 2013-10-21 VITALS — BP 134/68 | HR 57 | Ht 67.5 in | Wt 284.1 lb

## 2013-10-21 DIAGNOSIS — R609 Edema, unspecified: Secondary | ICD-10-CM

## 2013-10-21 DIAGNOSIS — G4733 Obstructive sleep apnea (adult) (pediatric): Secondary | ICD-10-CM

## 2013-10-21 DIAGNOSIS — I10 Essential (primary) hypertension: Secondary | ICD-10-CM

## 2013-10-21 DIAGNOSIS — R0602 Shortness of breath: Secondary | ICD-10-CM

## 2013-10-21 DIAGNOSIS — I4891 Unspecified atrial fibrillation: Secondary | ICD-10-CM

## 2013-10-21 MED ORDER — FUROSEMIDE 20 MG PO TABS: 10.0000 mg | ORAL_TABLET | ORAL | Status: DC | PRN

## 2013-10-21 NOTE — Progress Notes (Signed)
[place], [address] [place], [place]  [address] Phone: [phone] Fax:  [phone]  Date:  10/21/2013   ID:  Harold Scott, DOB 20-Dec-1960, MRN [id number]  PCP:  [name], MD  Cardiologist:  Dr. [name]     History of Present Illness: Harold Scott is a 53 y.o. male with a history of paroxysmal atrial fibrillation, sleep apnea. Patient did not tolerate Flecainide in the past. Last seen by [name] 08/2013. Patient has a history of symptomatic paroxysmal atrial fibrillation. Further antiarrhythmic drug therapy was recommended. He is placed on Multaq 400 mg twice a day. Patient called in with complaints of increased dyspnea with exertion. It was recommended that he stop taking Multaq and follow up today.  Echo (08/10/13):  Mod LVH, mild focal basal hypertrophy of the septum, EF 65-70%, no RWMA, Gr 2 DD, mild MR, mod to severe LAE.  He has stopped the Multaq.  He feels much better.  Breathing is back to normal.  Denies chest pain, syncope, near syncope, orthopnea, PND. He has some pedal edema from time to time.    Recent Labs: 11/26/2012: TSH 3.72   Wt Readings from Last 3 Encounters:  10/21/13 284 lb 1.9 oz (128.876 kg)  09/09/13 286 lb (129.729 kg)  09/01/13 289 lb 3.2 oz (131.18 kg)     Past Medical History  Diagnosis Date  . Anxiety   . Depression   . GERD (gastroesophageal reflux disease)   . Gallstones   . Asthma   . PONV (postoperative nausea and vomiting)   . Paroxysmal atrial fibrillation   . Obstructive sleep apnea     not compliant with CPAP    Current Outpatient Prescriptions  Medication Sig Dispense Refill  . albuterol (PROVENTIL HFA;VENTOLIN HFA) 108 (90 BASE) MCG/ACT inhaler Inhale 2 puffs into the lungs every 6 (six) hours as needed. Or shortness of breath      . aspirin 81 MG tablet Take 1 tablet (81 mg total) by mouth daily.  30 tablet  1  . cholestyramine (QUESTRAN) 4 GM/DOSE powder Take 2 packets daily  378 g  10  . citalopram  (CELEXA) 10 MG tablet Take 1 tablet (10 mg total) by mouth daily.  90 tablet  2  . clorazepate (TRANXENE) 7.5 MG tablet TAKE 1 TABLET AT BEDTIME  90 tablet  2  . esomeprazole (NEXIUM) 40 MG capsule Take 1 capsule (40 mg total) by mouth 2 (two) times daily as needed. For acid reflux  180 capsule  2  . guaiFENesin (MUCINEX) 600 MG 12 hr tablet Take 1,200 mg by mouth 2 (two) times daily as needed. For cough      . ibuprofen (ADVIL,MOTRIN) 200 MG tablet Take 400 mg by mouth every 6 (six) hours as needed. For pain      . losartan (COZAAR) 50 MG tablet Take 1 tablet (50 mg total) by mouth daily.  90 tablet  3  . metoprolol succinate (TOPROL-XL) 50 MG 24 hr tablet TAKE 1 TABLET (50 MG TOTAL) BY MOUTH DAILY.  100 tablet  1   No current facility-administered medications for this visit.    Allergies:   Flecainide; Multaq; and Penicillins   Social History:  The patient  reports that he quit smoking about 7 years ago. His smoking use included Cigarettes. He has a 15 pack-year smoking history. He has never used smokeless tobacco. He reports that he drinks alcohol. He reports that he does not use illicit drugs.  Family History:  The patient's family history is not on file. He was adopted.   ROS:  Please see the history of present illness.      All other systems reviewed and negative.   PHYSICAL EXAM: VS:  BP 134/68  Pulse 57  Ht 5' 7.5" (1.715 m)  Wt 284 lb 1.9 oz (128.876 kg)  BMI 43.82 kg/m2 Well nourished, well developed, in no acute distress HEENT: normal Neck: no JVD Cardiac:  normal S1, S2; RRR; no murmur Lungs:  clear to auscultation bilaterally, no wheezing, rhonchi or rales Abd: soft, nontender, no hepatomegaly Ext: trace bilateral LE edema Skin: warm and dry Neuro:  CNs 2-12 intact, no focal abnormalities noted  EKG:  Sinus brady, HR 57, no ST changes     ASSESSMENT AND PLAN:  1. Atrial Fibrillation:  Maintaining NSR. He did have an episode several days ago that lasted 8-12 hours.   CHADS2-VASc=1.  He is not on anticoagulation per guidelines.  He will keep his follow up Dr. [name] as planned to discuss further options.   2. Dyspnea:  Probably related to Multaq.  Will check BMET and TSH today as planned.  He does not appear to be volume overloaded.  Reassurance. 3. Edema:  He has some occasional edema.  I will give him Lasix 10 mg QD prn.   4. Sleep Apnea:  He is now on CPAP and feels much better.  Hopefully, his LA will remodel and PVI ablation will become an option for him. 5. Hypertension:  Controlled.  6. Disposition:  F/u with Dr. [name] as planned.   Signed, Richardson Dopp, PA-C  10/21/2013 4:25 PM

## 2013-10-21 NOTE — Patient Instructions (Signed)
START LASIX 20  MG TABLET YOU WILL TAKE 1/2 TABLET =10 MG AS NEEDED FOR LEG SWELLING  LAB WORK TODAY; BMET, TSH  KEEP YOUR APPT WITH DR. [name] ON 10/29/13

## 2013-10-22 ENCOUNTER — Other Ambulatory Visit: Payer: Self-pay | Admitting: *Deleted

## 2013-10-22 DIAGNOSIS — E875 Hyperkalemia: Secondary | ICD-10-CM

## 2013-10-22 LAB — BASIC METABOLIC PANEL
BUN: 11 mg/dL (ref 6–23)
CALCIUM: 10.1 mg/dL (ref 8.4–10.5)
CO2: 33 mEq/L — ABNORMAL HIGH (ref 19–32)
Chloride: 103 mEq/L (ref 96–112)
Creatinine, Ser: 1.1 mg/dL (ref 0.4–1.5)
GFR: 75.49 mL/min (ref >60.00)
GLUCOSE: 93 mg/dL (ref 70–99)
POTASSIUM: 5.6 meq/L — AB (ref 3.5–5.1)
SODIUM: 143 meq/L (ref 135–145)

## 2013-10-22 LAB — TSH: TSH: 4.41 u[IU]/mL (ref 0.35–5.50)

## 2013-10-23 ENCOUNTER — Other Ambulatory Visit (INDEPENDENT_AMBULATORY_CARE_PROVIDER_SITE_OTHER): Payer: PRIVATE HEALTH INSURANCE

## 2013-10-23 DIAGNOSIS — E875 Hyperkalemia: Secondary | ICD-10-CM

## 2013-10-23 LAB — BASIC METABOLIC PANEL
BUN: 9 mg/dL (ref 6–23)
CO2: 25 mEq/L (ref 19–32)
CREATININE: 0.8 mg/dL (ref 0.4–1.5)
Calcium: 9.2 mg/dL (ref 8.4–10.5)
Chloride: 99 mEq/L (ref 96–112)
GFR: 101.97 mL/min (ref >60.00)
Glucose, Bld: 116 mg/dL — ABNORMAL HIGH (ref 70–99)
Potassium: 3.5 mEq/L (ref 3.5–5.1)
Sodium: 134 mEq/L — ABNORMAL LOW (ref 135–145)

## 2013-10-26 ENCOUNTER — Telehealth: Payer: Self-pay | Admitting: *Deleted

## 2013-10-26 NOTE — Telephone Encounter (Signed)
Pt notified about lab results. I advised he could re-start cozaar now and he states to me that he did re-start on either 3/6 Friday or 3/7 Saturday. He states he thought he was only supposed to hold for 1 day, then re-start; I said no he was told to hold until we cb with his lab results on 3/6. I stated will probably re-check bmet when he sees [name] 3/12 since he did re-start Cozaar last week. Pt verbalized understanding to Plan of Care.

## 2013-10-27 ENCOUNTER — Encounter: Payer: Self-pay | Admitting: Pulmonary Disease

## 2013-10-27 ENCOUNTER — Ambulatory Visit (INDEPENDENT_AMBULATORY_CARE_PROVIDER_SITE_OTHER): Payer: PRIVATE HEALTH INSURANCE | Admitting: Pulmonary Disease

## 2013-10-27 ENCOUNTER — Ambulatory Visit: Payer: PRIVATE HEALTH INSURANCE | Admitting: Pulmonary Disease

## 2013-10-27 VITALS — BP 122/70 | HR 62 | Temp 98.5°F | Ht 68.0 in | Wt 285.8 lb

## 2013-10-27 DIAGNOSIS — G4733 Obstructive sleep apnea (adult) (pediatric): Secondary | ICD-10-CM

## 2013-10-27 NOTE — Assessment & Plan Note (Signed)
The pt is doing extremely well on cpap, with greatly improved sleep and daytime alertness.  His download shows great control of his osa, good compliance, and no significant mask leak.  I have asked him to continue with this, and to work aggressively on weight loss.

## 2013-10-27 NOTE — Patient Instructions (Signed)
Will keep your cpap on 10cm since this is controlling your sleep apnea well.  Keep working on weight loss followup with me again in 50mos.

## 2013-10-27 NOTE — Progress Notes (Signed)
   Subjective:    Patient ID: Harold Scott, male    DOB: 1961/08/14, 53 y.o.   MRN: [id number]  HPI The patient comes in today for followup of his obstructive sleep apnea. He was started on CPAP at the last visit, and has done very well with the device. He is having no issues with his mask fit or pressure, and feels that his sleep and daytime alertness have greatly improved. His download today shows excellent compliance.   Review of Systems  Constitutional: Negative for fever and unexpected weight change.  HENT: Negative for congestion, dental problem, ear pain, nosebleeds, postnasal drip, rhinorrhea, sinus pressure, sneezing, sore throat and trouble swallowing.   Eyes: Negative for redness and itching.  Respiratory: Negative for cough, chest tightness, shortness of breath and wheezing.   Cardiovascular: Negative for palpitations and leg swelling.  Gastrointestinal: Negative for nausea and vomiting.  Genitourinary: Negative for dysuria.  Musculoskeletal: Negative for joint swelling.  Skin: Negative for rash.  Neurological: Negative for headaches.  Hematological: Does not bruise/bleed easily.  Psychiatric/Behavioral: Negative for dysphoric mood. The patient is not nervous/anxious.        Objective:   Physical Exam Obese male in no acute distress Nose without purulence or discharge noted No skin breakdown or pressure necrosis from the CPAP mask Neck without lymphadenopathy or thyromegaly Lower extremities without edema, no cyanosis Alert and oriented, moves all 4 extremities appear       Assessment & Plan:

## 2013-10-29 ENCOUNTER — Ambulatory Visit (INDEPENDENT_AMBULATORY_CARE_PROVIDER_SITE_OTHER): Payer: PRIVATE HEALTH INSURANCE | Admitting: Internal Medicine

## 2013-10-29 ENCOUNTER — Encounter: Payer: Self-pay | Admitting: Internal Medicine

## 2013-10-29 VITALS — BP 141/84 | HR 58 | Ht 68.0 in | Wt 282.0 lb

## 2013-10-29 DIAGNOSIS — I4891 Unspecified atrial fibrillation: Secondary | ICD-10-CM

## 2013-10-29 DIAGNOSIS — E669 Obesity, unspecified: Secondary | ICD-10-CM

## 2013-10-29 DIAGNOSIS — G4733 Obstructive sleep apnea (adult) (pediatric): Secondary | ICD-10-CM

## 2013-10-29 DIAGNOSIS — I480 Paroxysmal atrial fibrillation: Secondary | ICD-10-CM

## 2013-10-29 MED ORDER — LOSARTAN POTASSIUM 50 MG PO TABS: 50.0000 mg | ORAL_TABLET | Freq: Every day | ORAL | Status: DC

## 2013-10-29 MED ORDER — METOPROLOL SUCCINATE ER 50 MG PO TB24: ORAL_TABLET | ORAL | Status: DC

## 2013-10-29 NOTE — Progress Notes (Signed)
PCP:  [name], MD  The patient presents today for routine electrophysiology followup.  Since last being seen in our clinic, the patient reports doing very well.  He has obtained his CPAP machine and has had a significant decrease in his afib burden since he started using.  He was unable to tolerate flecainide or Multaq due to shortness of breath.  He is very happy with his current health state and has found significant quality of life improvement with CPAP.  Today, he denies symptoms of chest pain, shortness of breath, orthopnea, PND, lower extremity edema, dizziness, presyncope, syncope, or neurologic sequela.  The patient feels that he is tolerating medications without difficulties and is otherwise without complaint today.   Past Medical History  Diagnosis Date  . Anxiety   . Depression   . GERD (gastroesophageal reflux disease)   . Gallstones   . Asthma   . PONV (postoperative nausea and vomiting)   . Paroxysmal atrial fibrillation   . Obstructive sleep apnea     not compliant with CPAP   Past Surgical History  Procedure Laterality Date  . Hand surgery    . Tonsillectomy    . Wisdom tooth extraction    . Cholecystectomy  08/05/2012    Procedure: LAPAROSCOPIC CHOLECYSTECTOMY WITH INTRAOPERATIVE CHOLANGIOGRAM;  Surgeon: [name], MD,FACS;  Location: [facility];  Service: General;  Laterality: N/A;    Current Outpatient Prescriptions  Medication Sig Dispense Refill  . albuterol (PROVENTIL HFA;VENTOLIN HFA) 108 (90 BASE) MCG/ACT inhaler Inhale 2 puffs into the lungs every 6 (six) hours as needed. Or shortness of breath      . aspirin 81 MG tablet Take 1 tablet (81 mg total) by mouth daily.  30 tablet  1  . cholestyramine (QUESTRAN) 4 GM/DOSE powder Take 2 packets daily  378 g  10  . citalopram (CELEXA) 10 MG tablet Take 1 tablet (10 mg total) by mouth daily.  90 tablet  2  . clorazepate (TRANXENE) 7.5 MG tablet TAKE 1 TABLET AT BEDTIME  90 tablet  2  . esomeprazole  (NEXIUM) 40 MG capsule Take 1 capsule (40 mg total) by mouth 2 (two) times daily as needed. For acid reflux  180 capsule  2  . furosemide (LASIX) 20 MG tablet Take 0.5 tablets (10 mg total) by mouth as needed.  20 tablet  1  . guaiFENesin (MUCINEX) 600 MG 12 hr tablet Take 1,200 mg by mouth 2 (two) times daily as needed. For cough      . ibuprofen (ADVIL,MOTRIN) 200 MG tablet Take 400 mg by mouth every 6 (six) hours as needed. For pain      . losartan (COZAAR) 50 MG tablet Take 1 tablet (50 mg total) by mouth daily.  90 tablet  3  . metoprolol succinate (TOPROL-XL) 50 MG 24 hr tablet TAKE 1 TABLET (50 MG TOTAL) BY MOUTH DAILY.  100 tablet  1   No current facility-administered medications for this visit.    Allergies  Allergen Reactions  . Flecainide Other (See Comments)    EXTREME SOB  . Multaq [Dronedarone] Shortness Of Breath  . Penicillins Hives    History   Social History  . Marital Status: Married    Spouse Name: N/A    Number of Children: 1  . Years of Education: N/A   Occupational History  . Courier    Social History Main Topics  . Smoking status: Former Smoker -- 0.50 packs/day for 30 years  Types: Cigarettes    Quit date: 07/23/2006  . Smokeless tobacco: Never Used  . Alcohol Use: Yes     Comment: social  . Drug Use: No  . Sexual Activity: Not on file   Other Topics Concern  . Not on file   Social History Narrative   Pt works as a Secondary school teacher    Family History  Problem Relation Age of Onset  . Adopted: Yes    ROS-  All systems are reviewed and are negative except as outlined in the HPI above  Physical Exam: Filed Vitals:   10/29/13 1557  BP: 141/84  Pulse: 58  Height: 5\' 8"  (1.727 m)  Weight: 282 lb (127.914 kg)    GEN- The patient is overweight appearing, alert and oriented x 3 today.   Head- normocephalic, atraumatic Eyes-  Sclera clear, conjunctiva pink Ears- hearing intact Oropharynx- clear Neck- supple, no JVP Lymph- no cervical  lymphadenopathy Lungs- Clear to ausculation bilaterally, normal work of breathing Heart- Regular rate and rhythm, no murmurs, rubs or gallops, PMI not laterally displaced GI- soft, NT, ND, + BS Extremities- no clubbing, cyanosis, or edema MS- no significant deformity or atrophy Skin- no rash or lesion Psych- euthymic mood, full affect Neuro- strength and sensation are intact  EKG today reveals sinus rhythm 58 bpm, incomplete RBBB, inferior infarct,   Assessment and Plan: 1.  Atrial fibrillation Clinically stable No changes at this time Could consider tikosyn vs ablation if his afib burden increases further Chads2vasc is 0  2. OSA Compliance with CPAP is encouarged  3. Obesity Weight loss is advised  Return to see me in 6 months He will call me if problems arise in the interim

## 2013-10-29 NOTE — Patient Instructions (Signed)
Your physician wants you to follow-up in: 6 months with Dr. [name]. You will receive a reminder letter in the mail two months in advance. If you don't receive a letter, please call our office to schedule the follow-up appointment.  

## 2013-12-28 ENCOUNTER — Other Ambulatory Visit: Payer: Self-pay | Admitting: *Deleted

## 2013-12-28 MED ORDER — ESOMEPRAZOLE MAGNESIUM 40 MG PO CPDR: 40.0000 mg | DELAYED_RELEASE_CAPSULE | Freq: Two times a day (BID) | ORAL | Status: DC | PRN

## 2013-12-28 MED ORDER — CLORAZEPATE DIPOTASSIUM 7.5 MG PO TABS: ORAL_TABLET | ORAL | Status: DC

## 2013-12-28 MED ORDER — CITALOPRAM HYDROBROMIDE 10 MG PO TABS: 10.0000 mg | ORAL_TABLET | Freq: Every day | ORAL | Status: DC

## 2013-12-31 ENCOUNTER — Other Ambulatory Visit: Payer: Self-pay | Admitting: Family Medicine

## 2014-01-04 ENCOUNTER — Telehealth: Payer: Self-pay | Admitting: Internal Medicine

## 2014-01-04 NOTE — Telephone Encounter (Signed)
New message  Pt called states that he usually goes into Afib for 12 hours and then it converts back to a normal rhythm.. However he has been in Afib now for 3 days. Please call back to discuss.

## 2014-01-06 NOTE — Telephone Encounter (Signed)
He was at the beach and had to much alcohol and thinks this is part of the reason he went out of rhythm.  He went back into NSR 30 min after he called me.  He is going to lay off the alcohol and give me a call next week for an update

## 2014-02-13 ENCOUNTER — Encounter (HOSPITAL_COMMUNITY): Payer: PRIVATE HEALTH INSURANCE | Admitting: Certified Registered"

## 2014-02-13 ENCOUNTER — Telehealth: Payer: Self-pay

## 2014-02-13 ENCOUNTER — Emergency Department (HOSPITAL_COMMUNITY): Payer: PRIVATE HEALTH INSURANCE | Admitting: Certified Registered"

## 2014-02-13 ENCOUNTER — Emergency Department (HOSPITAL_COMMUNITY): Payer: PRIVATE HEALTH INSURANCE

## 2014-02-13 ENCOUNTER — Encounter (HOSPITAL_COMMUNITY)
Admission: EM | Disposition: A | Discharge status: Home / Self Care | Payer: Self-pay | Source: Home / Self Care | Attending: Emergency Medicine

## 2014-02-13 ENCOUNTER — Ambulatory Visit (HOSPITAL_COMMUNITY)
Admission: EM | Admit: 2014-02-13 | Discharge: 2014-02-14 | Disposition: A | Discharge status: Home / Self Care | Payer: PRIVATE HEALTH INSURANCE | Attending: General Surgery | Admitting: General Surgery

## 2014-02-13 ENCOUNTER — Encounter (HOSPITAL_COMMUNITY): Payer: Self-pay | Admitting: Emergency Medicine

## 2014-02-13 ENCOUNTER — Ambulatory Visit: Payer: PRIVATE HEALTH INSURANCE | Admitting: Internal Medicine

## 2014-02-13 DIAGNOSIS — F329 Major depressive disorder, single episode, unspecified: Secondary | ICD-10-CM | POA: Insufficient documentation

## 2014-02-13 DIAGNOSIS — K37 Unspecified appendicitis: Secondary | ICD-10-CM | POA: Diagnosis present

## 2014-02-13 DIAGNOSIS — G4733 Obstructive sleep apnea (adult) (pediatric): Secondary | ICD-10-CM | POA: Insufficient documentation

## 2014-02-13 DIAGNOSIS — F3289 Other specified depressive episodes: Secondary | ICD-10-CM | POA: Insufficient documentation

## 2014-02-13 DIAGNOSIS — K3580 Unspecified acute appendicitis: Secondary | ICD-10-CM | POA: Insufficient documentation

## 2014-02-13 DIAGNOSIS — R109 Unspecified abdominal pain: Secondary | ICD-10-CM

## 2014-02-13 DIAGNOSIS — Z79899 Other long term (current) drug therapy: Secondary | ICD-10-CM | POA: Insufficient documentation

## 2014-02-13 DIAGNOSIS — K3589 Other acute appendicitis without perforation or gangrene: Secondary | ICD-10-CM

## 2014-02-13 DIAGNOSIS — K219 Gastro-esophageal reflux disease without esophagitis: Secondary | ICD-10-CM | POA: Insufficient documentation

## 2014-02-13 DIAGNOSIS — E669 Obesity, unspecified: Secondary | ICD-10-CM | POA: Insufficient documentation

## 2014-02-13 DIAGNOSIS — R0902 Hypoxemia: Secondary | ICD-10-CM | POA: Insufficient documentation

## 2014-02-13 DIAGNOSIS — J9819 Other pulmonary collapse: Secondary | ICD-10-CM | POA: Insufficient documentation

## 2014-02-13 DIAGNOSIS — J45909 Unspecified asthma, uncomplicated: Secondary | ICD-10-CM | POA: Insufficient documentation

## 2014-02-13 DIAGNOSIS — Z87891 Personal history of nicotine dependence: Secondary | ICD-10-CM | POA: Insufficient documentation

## 2014-02-13 DIAGNOSIS — F411 Generalized anxiety disorder: Secondary | ICD-10-CM | POA: Insufficient documentation

## 2014-02-13 DIAGNOSIS — Z9089 Acquired absence of other organs: Secondary | ICD-10-CM | POA: Insufficient documentation

## 2014-02-13 DIAGNOSIS — R112 Nausea with vomiting, unspecified: Secondary | ICD-10-CM

## 2014-02-13 HISTORY — PX: LAPAROSCOPIC APPENDECTOMY: SHX408

## 2014-02-13 LAB — COMPREHENSIVE METABOLIC PANEL
ALK PHOS: 109 U/L (ref 39–117)
ALT: 50 U/L (ref 0–53)
AST: 37 U/L (ref 0–37)
Albumin: 4.3 g/dL (ref 3.5–5.2)
BUN: 11 mg/dL (ref 6–23)
CO2: 23 meq/L (ref 19–32)
Calcium: 9.1 mg/dL (ref 8.4–10.5)
Chloride: 93 mEq/L — ABNORMAL LOW (ref 96–112)
Creatinine, Ser: 0.81 mg/dL (ref 0.50–1.35)
GFR calc non Af Amer: >90 mL/min (ref >90)
GLUCOSE: 125 mg/dL — AB (ref 70–99)
Potassium: 4.2 mEq/L (ref 3.7–5.3)
SODIUM: 134 meq/L — AB (ref 137–147)
TOTAL PROTEIN: 7.6 g/dL (ref 6.0–8.3)
Total Bilirubin: 1.1 mg/dL (ref 0.3–1.2)

## 2014-02-13 LAB — CBC WITH DIFFERENTIAL/PLATELET
Basophils Absolute: 0 10*3/uL (ref 0.0–0.1)
Basophils Relative: 0 % (ref 0–1)
EOS ABS: 0 10*3/uL (ref 0.0–0.7)
Eosinophils Relative: 0 % (ref 0–5)
HCT: 40.4 % (ref 39.0–52.0)
Hemoglobin: 13.6 g/dL (ref 13.0–17.0)
LYMPHS ABS: 0.7 10*3/uL (ref 0.7–4.0)
LYMPHS PCT: 5 % — AB (ref 12–46)
MCH: 30.6 pg (ref 26.0–34.0)
MCHC: 33.7 g/dL (ref 30.0–36.0)
MCV: 91 fL (ref 78.0–100.0)
Monocytes Absolute: 1.7 10*3/uL — ABNORMAL HIGH (ref 0.1–1.0)
Monocytes Relative: 10 % (ref 3–12)
NEUTROS PCT: 85 % — AB (ref 43–77)
Neutro Abs: 13.7 10*3/uL — ABNORMAL HIGH (ref 1.7–7.7)
PLATELETS: 198 10*3/uL (ref 150–400)
RBC: 4.44 MIL/uL (ref 4.22–5.81)
RDW: 14 % (ref 11.5–15.5)
WBC: 16.1 10*3/uL — AB (ref 4.0–10.5)

## 2014-02-13 LAB — URINALYSIS, ROUTINE W REFLEX MICROSCOPIC
BILIRUBIN URINE: NEGATIVE
GLUCOSE, UA: NEGATIVE mg/dL
Ketones, ur: NEGATIVE mg/dL
Leukocytes, UA: NEGATIVE
Nitrite: NEGATIVE
Protein, ur: 30 mg/dL — AB
SPECIFIC GRAVITY, URINE: 1.021 (ref 1.005–1.030)
UROBILINOGEN UA: 0.2 mg/dL (ref 0.0–1.0)
pH: 5.5 (ref 5.0–8.0)

## 2014-02-13 LAB — LIPASE, BLOOD: Lipase: 29 U/L (ref 11–59)

## 2014-02-13 LAB — URINE MICROSCOPIC-ADD ON

## 2014-02-13 SURGERY — APPENDECTOMY, LAPAROSCOPIC
Anesthesia: General | Site: Abdomen

## 2014-02-13 MED ORDER — IOHEXOL 300 MG/ML  SOLN
100.0000 mL | Freq: Once | INTRAMUSCULAR | Status: AC | PRN
  Administered 2014-02-13: 100 mL via INTRAVENOUS

## 2014-02-13 MED ORDER — MIDAZOLAM HCL 2 MG/2ML IJ SOLN
INTRAMUSCULAR | Status: AC
  Filled 2014-02-13: qty 2

## 2014-02-13 MED ORDER — FENTANYL CITRATE 0.05 MG/ML IJ SOLN
INTRAMUSCULAR | Status: DC | PRN
  Administered 2014-02-13: 50 ug via INTRAVENOUS
  Administered 2014-02-13: 100 ug via INTRAVENOUS

## 2014-02-13 MED ORDER — FENTANYL CITRATE 0.05 MG/ML IJ SOLN
INTRAMUSCULAR | Status: AC
  Filled 2014-02-13: qty 5

## 2014-02-13 MED ORDER — OXYCODONE-ACETAMINOPHEN 5-325 MG PO TABS: 1.0000 | ORAL_TABLET | ORAL | Status: DC | PRN

## 2014-02-13 MED ORDER — CLORAZEPATE DIPOTASSIUM 7.5 MG PO TABS
7.5000 mg | ORAL_TABLET | Freq: Every day | ORAL | Status: DC
  Administered 2014-02-13: 7.5 mg via ORAL
  Filled 2014-02-13: qty 1

## 2014-02-13 MED ORDER — LACTATED RINGERS IV SOLN
INTRAVENOUS | Status: DC | PRN
  Administered 2014-02-13: 16:00:00 via INTRAVENOUS

## 2014-02-13 MED ORDER — ACETAMINOPHEN 650 MG RE SUPP
650.0000 mg | Freq: Once | RECTAL | Status: DC
  Filled 2014-02-13: qty 1

## 2014-02-13 MED ORDER — PROPOFOL 10 MG/ML IV BOLUS
INTRAVENOUS | Status: DC | PRN
  Administered 2014-02-13: 200 mg via INTRAVENOUS

## 2014-02-13 MED ORDER — CITALOPRAM HYDROBROMIDE 10 MG PO TABS
10.0000 mg | ORAL_TABLET | Freq: Every day | ORAL | Status: DC
  Administered 2014-02-13 – 2014-02-14 (×2): 10 mg via ORAL
  Filled 2014-02-13 (×2): qty 1

## 2014-02-13 MED ORDER — ONDANSETRON HCL 4 MG/2ML IJ SOLN
INTRAMUSCULAR | Status: DC | PRN
  Administered 2014-02-13: 4 mg via INTRAVENOUS

## 2014-02-13 MED ORDER — ONDANSETRON HCL 4 MG/2ML IJ SOLN
INTRAMUSCULAR | Status: AC
  Filled 2014-02-13: qty 2

## 2014-02-13 MED ORDER — LACTATED RINGERS IV SOLN: INTRAVENOUS | Status: DC

## 2014-02-13 MED ORDER — MORPHINE SULFATE 4 MG/ML IJ SOLN: 4.0000 mg | INTRAMUSCULAR | Status: DC | PRN

## 2014-02-13 MED ORDER — DEXAMETHASONE SODIUM PHOSPHATE 10 MG/ML IJ SOLN
INTRAMUSCULAR | Status: AC
  Filled 2014-02-13: qty 1

## 2014-02-13 MED ORDER — DEXAMETHASONE SODIUM PHOSPHATE 10 MG/ML IJ SOLN
INTRAMUSCULAR | Status: DC | PRN
Start: 2014-02-13 — End: 2014-02-13
  Administered 2014-02-13: 10 mg via INTRAVENOUS

## 2014-02-13 MED ORDER — CIPROFLOXACIN IN D5W 400 MG/200ML IV SOLN: 400.0000 mg | Freq: Two times a day (BID) | INTRAVENOUS | Status: DC

## 2014-02-13 MED ORDER — SODIUM CHLORIDE 0.9 % IV SOLN: 1000.0000 mL | INTRAVENOUS | Status: DC

## 2014-02-13 MED ORDER — METOPROLOL SUCCINATE ER 50 MG PO TB24
50.0000 mg | ORAL_TABLET | Freq: Every day | ORAL | Status: DC
  Administered 2014-02-13 – 2014-02-14 (×2): 50 mg via ORAL
  Filled 2014-02-13 (×2): qty 1

## 2014-02-13 MED ORDER — SODIUM CHLORIDE 0.9 % IV SOLN
1000.0000 mL | Freq: Once | INTRAVENOUS | Status: AC
  Administered 2014-02-13: 1000 mL via INTRAVENOUS

## 2014-02-13 MED ORDER — GLYCOPYRROLATE 0.2 MG/ML IJ SOLN
INTRAMUSCULAR | Status: DC | PRN
  Administered 2014-02-13: 0.6 mg via INTRAVENOUS

## 2014-02-13 MED ORDER — ONDANSETRON HCL 4 MG/2ML IJ SOLN: 4.0000 mg | Freq: Four times a day (QID) | INTRAMUSCULAR | Status: DC | PRN

## 2014-02-13 MED ORDER — KCL IN DEXTROSE-NACL 20-5-0.9 MEQ/L-%-% IV SOLN
INTRAVENOUS | Status: DC
  Filled 2014-02-13 (×2): qty 1000

## 2014-02-13 MED ORDER — METRONIDAZOLE IN NACL 5-0.79 MG/ML-% IV SOLN
500.0000 mg | Freq: Once | INTRAVENOUS | Status: AC
  Administered 2014-02-13: 500 mg via INTRAVENOUS
  Filled 2014-02-13: qty 100

## 2014-02-13 MED ORDER — LACTATED RINGERS IR SOLN
Status: DC | PRN
  Administered 2014-02-13: 1000 mL

## 2014-02-13 MED ORDER — PANTOPRAZOLE SODIUM 40 MG PO TBEC
40.0000 mg | DELAYED_RELEASE_TABLET | Freq: Every day | ORAL | Status: DC
  Administered 2014-02-13 – 2014-02-14 (×2): 40 mg via ORAL
  Filled 2014-02-13 (×2): qty 1

## 2014-02-13 MED ORDER — ROCURONIUM BROMIDE 100 MG/10ML IV SOLN
INTRAVENOUS | Status: DC | PRN
Start: 2014-02-13 — End: 2014-02-13
  Administered 2014-02-13: 10 mg via INTRAVENOUS
  Administered 2014-02-13: 40 mg via INTRAVENOUS

## 2014-02-13 MED ORDER — NEOSTIGMINE METHYLSULFATE 10 MG/10ML IV SOLN
INTRAVENOUS | Status: DC | PRN
  Administered 2014-02-13: 4 mg via INTRAVENOUS

## 2014-02-13 MED ORDER — CIPROFLOXACIN IN D5W 400 MG/200ML IV SOLN
400.0000 mg | Freq: Once | INTRAVENOUS | Status: AC
  Administered 2014-02-13: 400 mg via INTRAVENOUS
  Filled 2014-02-13: qty 200

## 2014-02-13 MED ORDER — MORPHINE SULFATE 4 MG/ML IJ SOLN
4.0000 mg | Freq: Once | INTRAMUSCULAR | Status: AC
  Administered 2014-02-13: 4 mg via INTRAVENOUS
  Filled 2014-02-13: qty 1

## 2014-02-13 MED ORDER — METOPROLOL TARTRATE 1 MG/ML IV SOLN
INTRAVENOUS | Status: AC
  Filled 2014-02-13: qty 5

## 2014-02-13 MED ORDER — MIDAZOLAM HCL 5 MG/5ML IJ SOLN
INTRAMUSCULAR | Status: DC | PRN
  Administered 2014-02-13: 2 mg via INTRAVENOUS

## 2014-02-13 MED ORDER — PROPOFOL 10 MG/ML IV BOLUS
INTRAVENOUS | Status: AC
Start: 2014-02-13 — End: 2014-02-13
  Filled 2014-02-13: qty 20

## 2014-02-13 MED ORDER — IOHEXOL 300 MG/ML  SOLN
50.0000 mL | Freq: Once | INTRAMUSCULAR | Status: AC | PRN
  Administered 2014-02-13: 50 mL via ORAL

## 2014-02-13 MED ORDER — BUPIVACAINE-EPINEPHRINE (PF) 0.25% -1:200000 IJ SOLN
INTRAMUSCULAR | Status: AC
  Filled 2014-02-13: qty 30

## 2014-02-13 MED ORDER — BUPIVACAINE-EPINEPHRINE 0.25% -1:200000 IJ SOLN
INTRAMUSCULAR | Status: DC | PRN
  Administered 2014-02-13: 25 mL

## 2014-02-13 MED ORDER — HYDROMORPHONE HCL PF 1 MG/ML IJ SOLN
0.2500 mg | INTRAMUSCULAR | Status: DC | PRN
  Administered 2014-02-13 (×2): 0.5 mg via INTRAVENOUS

## 2014-02-13 MED ORDER — HYDROMORPHONE HCL PF 1 MG/ML IJ SOLN
INTRAMUSCULAR | Status: AC
  Filled 2014-02-13: qty 1

## 2014-02-13 MED ORDER — NEOSTIGMINE METHYLSULFATE 10 MG/10ML IV SOLN
INTRAVENOUS | Status: AC
  Filled 2014-02-13: qty 1

## 2014-02-13 MED ORDER — HEPARIN SODIUM (PORCINE) 5000 UNIT/ML IJ SOLN
5000.0000 [IU] | Freq: Three times a day (TID) | INTRAMUSCULAR | Status: DC
  Administered 2014-02-14: 5000 [IU] via SUBCUTANEOUS
  Filled 2014-02-13 (×4): qty 1

## 2014-02-13 MED ORDER — ONDANSETRON HCL 4 MG PO TABS: 4.0000 mg | ORAL_TABLET | Freq: Four times a day (QID) | ORAL | Status: DC | PRN

## 2014-02-13 MED ORDER — METOPROLOL TARTRATE 1 MG/ML IV SOLN
INTRAVENOUS | Status: DC | PRN
  Administered 2014-02-13 (×2): 1 mg via INTRAVENOUS

## 2014-02-13 MED ORDER — ONDANSETRON HCL 4 MG/2ML IJ SOLN
4.0000 mg | Freq: Once | INTRAMUSCULAR | Status: AC
  Administered 2014-02-13: 4 mg via INTRAVENOUS
  Filled 2014-02-13: qty 2

## 2014-02-13 MED ORDER — GLYCOPYRROLATE 0.2 MG/ML IJ SOLN
INTRAMUSCULAR | Status: AC
  Filled 2014-02-13: qty 3

## 2014-02-13 MED ORDER — SODIUM CHLORIDE 0.9 % IJ SOLN
INTRAMUSCULAR | Status: AC
  Filled 2014-02-13: qty 3

## 2014-02-13 MED ORDER — SUCCINYLCHOLINE CHLORIDE 20 MG/ML IJ SOLN
INTRAMUSCULAR | Status: DC | PRN
  Administered 2014-02-13: 140 mg via INTRAVENOUS

## 2014-02-13 MED ORDER — ALBUTEROL SULFATE (2.5 MG/3ML) 0.083% IN NEBU
3.0000 mL | INHALATION_SOLUTION | Freq: Four times a day (QID) | RESPIRATORY_TRACT | Status: DC | PRN
  Administered 2014-02-13 – 2014-02-14 (×2): 3 mL via RESPIRATORY_TRACT
  Filled 2014-02-13: qty 3

## 2014-02-13 MED ORDER — LIDOCAINE HCL (CARDIAC) 20 MG/ML IV SOLN
INTRAVENOUS | Status: AC
  Filled 2014-02-13: qty 5

## 2014-02-13 MED ORDER — ALBUTEROL SULFATE (2.5 MG/3ML) 0.083% IN NEBU
INHALATION_SOLUTION | RESPIRATORY_TRACT | Status: AC
  Filled 2014-02-13: qty 3

## 2014-02-13 MED ORDER — LOSARTAN POTASSIUM 50 MG PO TABS
50.0000 mg | ORAL_TABLET | Freq: Every day | ORAL | Status: DC
  Administered 2014-02-13 – 2014-02-14 (×2): 50 mg via ORAL
  Filled 2014-02-13 (×2): qty 1

## 2014-02-13 MED ORDER — 0.9 % SODIUM CHLORIDE (POUR BTL) OPTIME
TOPICAL | Status: DC | PRN
  Administered 2014-02-13: 1000 mL

## 2014-02-13 MED ORDER — ROCURONIUM BROMIDE 100 MG/10ML IV SOLN
INTRAVENOUS | Status: AC
  Filled 2014-02-13: qty 1

## 2014-02-13 SURGICAL SUPPLY — 38 items
ADH SKN CLS APL DERMABOND .7 (GAUZE/BANDAGES/DRESSINGS) ×1
APPLIER CLIP 5 13 M/L LIGAMAX5 (MISCELLANEOUS) ×3
APPLIER CLIP ROT 10 11.4 M/L (STAPLE)
APR CLP MED LRG 5 ANG JAW (MISCELLANEOUS) ×1
CANISTER SUCTION 2500CC (MISCELLANEOUS) ×3 IMPLANT
CLIP APPLIE 5 13 M/L LIGAMAX5 (MISCELLANEOUS) ×1 IMPLANT
CLIP APPLIE ROT 10 11.4 M/L (STAPLE) IMPLANT
CUTTER FLEX LINEAR 45M (STAPLE) ×3 IMPLANT
DECANTER SPIKE VIAL GLASS SM (MISCELLANEOUS) ×3 IMPLANT
DERMABOND ADVANCED (GAUZE/BANDAGES/DRESSINGS) ×2
DERMABOND ADVANCED .7 DNX12 (GAUZE/BANDAGES/DRESSINGS) ×1 IMPLANT
DRAPE LAPAROSCOPIC ABDOMINAL (DRAPES) ×3 IMPLANT
DRAPE UTILITY XL STRL (DRAPES) ×3 IMPLANT
ELECT REM PT RETURN 9FT ADLT (ELECTROSURGICAL) ×3
ELECTRODE REM PT RTRN 9FT ADLT (ELECTROSURGICAL) ×1 IMPLANT
ENDOLOOP SUT PDS II  0 18 (SUTURE)
ENDOLOOP SUT PDS II 0 18 (SUTURE) IMPLANT
GLOVE BIO SURGEON STRL SZ7.5 (GLOVE) ×3 IMPLANT
GOWN STRL REUS W/ TWL XL LVL3 (GOWN DISPOSABLE) ×1 IMPLANT
GOWN STRL REUS W/TWL XL LVL3 (GOWN DISPOSABLE) ×8 IMPLANT
IV LACTATED RINGERS 1000ML (IV SOLUTION) ×3 IMPLANT
KIT BASIN OR (CUSTOM PROCEDURE TRAY) ×3 IMPLANT
NS IRRIG 1000ML POUR BTL (IV SOLUTION) ×3 IMPLANT
PENCIL BUTTON HOLSTER BLD 10FT (ELECTRODE) ×3 IMPLANT
POUCH SPECIMEN RETRIEVAL 10MM (ENDOMECHANICALS) IMPLANT
RELOAD 45 VASCULAR/THIN (ENDOMECHANICALS) IMPLANT
RELOAD STAPLE TA45 3.5 REG BLU (ENDOMECHANICALS) ×3 IMPLANT
SET IRRIG TUBING LAPAROSCOPIC (IRRIGATION / IRRIGATOR) ×3 IMPLANT
SHEARS HARMONIC ACE PLUS 36CM (ENDOMECHANICALS) IMPLANT
SOLUTION ANTI FOG 6CC (MISCELLANEOUS) ×3 IMPLANT
SUT MNCRL AB 4-0 PS2 18 (SUTURE) ×3 IMPLANT
TOWEL OR 17X26 10 PK STRL BLUE (TOWEL DISPOSABLE) ×3 IMPLANT
TRAY FOLEY CATH 14FRSI W/METER (CATHETERS) ×3 IMPLANT
TRAY LAP CHOLE (CUSTOM PROCEDURE TRAY) ×3 IMPLANT
TROCAR BLADELESS OPT 5 75 (ENDOMECHANICALS) ×3 IMPLANT
TROCAR SLEEVE XCEL 5X75 (ENDOMECHANICALS) ×3 IMPLANT
TROCAR XCEL BLUNT TIP 100MML (ENDOMECHANICALS) ×3 IMPLANT
TUBING INSUFFLATION 10FT LAP (TUBING) ×3 IMPLANT

## 2014-02-13 NOTE — ED Notes (Signed)
Dr. Marlou Starks has just spoken with pt. And his family.  They are in enthusiastic agreement with plan to undergo appendectomy today.  CHG bath is being given as I write this.

## 2014-02-13 NOTE — ED Notes (Signed)
I have attempted to start his IV twice without success.  IV team is on their way to start it for me.  Pt. Is very understanding.

## 2014-02-13 NOTE — ED Notes (Signed)
He states he has just returned from a vacation in [place].  He c/o generalized abd. Discomfort and "bloating" with n/v/d since ~0200 today.  His wife is with him.

## 2014-02-13 NOTE — Anesthesia Preprocedure Evaluation (Addendum)
Anesthesia Evaluation  Patient identified by MRN, date of birth, ID band Patient awake    Reviewed: Allergy & Precautions, H&P , NPO status , Patient's Chart, lab work & pertinent test results  History of Anesthesia Complications (+) PONV  Airway Mallampati: III TM Distance: >3 FB Neck ROM: full    Dental no notable dental hx. (+) Teeth Intact, Dental Advisory Given   Pulmonary asthma , sleep apnea and Continuous Positive Airway Pressure Ventilation , former smoker,  breath sounds clear to auscultation  Pulmonary exam normal       Cardiovascular Exercise Tolerance: Good hypertension, Pt. on medications + dysrhythmias Atrial Fibrillation Rhythm:regular Rate:Normal  RBBB/ inf MI   Neuro/Psych negative neurological ROS  negative psych ROS   GI/Hepatic negative GI ROS, Neg liver ROS, GERD-  Medicated and Controlled,  Endo/Other  negative endocrine ROSMorbid obesity  Renal/GU negative Renal ROS  negative genitourinary   Musculoskeletal   Abdominal (+) + obese,   Peds  Hematology negative hematology ROS (+)   Anesthesia Other Findings   Reproductive/Obstetrics negative OB ROS                          Anesthesia Physical Anesthesia Plan  ASA: III  Anesthesia Plan: General   Post-op Pain Management:    Induction: Intravenous, Rapid sequence and Cricoid pressure planned  Airway Management Planned: Oral ETT  Additional Equipment:   Intra-op Plan:   Post-operative Plan: Extubation in OR  Informed Consent: I have reviewed the patients History and Physical, chart, labs and discussed the procedure including the risks, benefits and alternatives for the proposed anesthesia with the patient or authorized representative who has indicated his/her understanding and acceptance.   Dental Advisory Given  Plan Discussed with: CRNA and Surgeon  Anesthesia Plan Comments:         Anesthesia  Quick Evaluation

## 2014-02-13 NOTE — Op Note (Signed)
02/13/2014  5:09 PM  PATIENT:  Harold Scott  53 y.o. male  PRE-OPERATIVE DIAGNOSIS:  appendicitis  POST-OPERATIVE DIAGNOSIS:  appendicitis  PROCEDURE:  Procedure(s): APPENDECTOMY LAPAROSCOPIC (N/A)  SURGEON:  Surgeon(s) and Role:    * [name], MD - Primary  PHYSICIAN ASSISTANT:   ASSISTANTS: none   ANESTHESIA:   general  EBL:     BLOOD ADMINISTERED:none  DRAINS: none   LOCAL MEDICATIONS USED:  MARCAINE     SPECIMEN:  Source of Specimen:  appendix  DISPOSITION OF SPECIMEN:  PATHOLOGY  COUNTS:  YES  TOURNIQUET:  * No tourniquets in log *  DICTATION: .Dragon Dictation After informed consent was obtained patient was brought to the operating room placed in the supine position on the operating room table. After adequate induction of general anesthesia the patient's abdomen was prepped with ChloraPrep, allowed to dry, and draped in usual sterile manner. The area below the umbilicus was infiltrated with quarter percent Marcaine. A small incision was made with a 15 blade knife. This incision was carried down through the subcutaneous tissue bluntly with a hemostat and [employer]-Nav[employer] retractors until the linea alba was identified. The linea alba was incised with a 15 blade knife. Each side was grasped Coker clamps and elevated anteriorly. The preperitoneal space was probed bluntly with a hemostat until the peritoneum was opened and access was gained to the abdominal cavity. A 0 Vicryl purse string stitch was placed in the fascia surrounding the opening. A Hassan cannula was placed through the opening and anchored in place with the previously placed Vicryl purse string stitch. The laparoscope was placed through the [facility] cannula. The abdomen was insufflated with carbon dioxide without difficulty. Next the suprapubic area was infiltrated with quarter percent Marcaine. A small incision was made with a 15 blade knife. A 5 mm port was placed bluntly through this incision into the  abdominal cavity. A site was then chosen on the upper abdomen for placement of a 5 mm port. The area was infiltrated with quarter percent Marcaine. A small stab incision was made with a 15 blade knife. A 5 mm port was placed bluntly through this incision and the abdominal cavity under direct vision. The laparoscope was then moved to the suprapubic port. Using a Glassman grasper and harmonic scalpel the right lower quadrant was inspected. The appendix was readily identified. The appendix was elevated anteriorly and the mesoappendix was taken down sharply with the harmonic scalpel. Once the base of the appendix where it joined the cecum was identified and cleared of any tissue then a laparoscopic GIA blue load 6 row stapler was placed through the [facility] cannula. The stapler was placed across the base of the appendix clamped and fired thereby dividing the base of the appendix between staple lines. A laparoscopic bag was then inserted through the [facility] cannula. The appendix was placed within the bag and the bag was sealed. The abdomen was then irrigated with copious amounts of saline until the effluent was clear.  A small amount of bleeding from the staple line was controlled with clips. No other abnormalities were noted. The appendix and bag were removed with the [facility] cannula through the infraumbilical port without difficulty. The fascial defect was closed with the previously placed Vicryl pursestring stitch as well as with another interrupted 0 Vicryl figure-of-eight stitch. The rest of the ports were removed under direct vision and were found to be hemostatic. The gas was allowed to escape. The skin incisions were closed with  interrupted 4-0 Monocryl subcuticular stitches. Dermabond dressings were applied. The patient tolerated the procedure well. At the end of the case all needle sponge and instrument counts were correct. The patient was then awakened and taken to recovery in stable condition.  PLAN OF CARE:  Admit for overnight observation  PATIENT DISPOSITION:  PACU - hemodynamically stable.   Delay start of Pharmacological VTE agent (>24hrs) due to surgical blood loss or risk of bleeding: yes

## 2014-02-13 NOTE — ED Notes (Signed)
Patient c/o RUQ pain since 0200 today. 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 Patient denies N/V.  Patient states his abdomen is tender to touch.

## 2014-02-13 NOTE — ED Provider Notes (Signed)
CSN: [id number]     Arrival date & time 02/13/14  1111 History   First MD Initiated Contact with Patient 02/13/14 1132     Chief Complaint  Patient presents with  . Abdominal Pain     (Consider location/radiation/quality/duration/timing/severity/associated sxs/prior Treatment) HPI Patient reports he just came home from a six-day trip to [place]. They arrived home yesterday evening. He reports he drank a lot of alcohol during that visit, much more than he normally does. He reports he was awakened at 2 AM with diffuse intestinal cramping. He reports he had diarrhea which he relates to his usual "bile dumping".he reports he continues to have a lot of abdominal discomfort. About 5 AM he took half of the Demerol. He states he's having epigastric and right upper quadrant pain. He states movement and twisting and touching the area makes the pain worse. He has had nausea and dry heaves. He has had no more diarrhea since the episode at 2 AM. He denies fever. He states he's never had this pain before. He denies any urinary problems such as hematuria or difficulty urinating. He did not see any blood in his diarrhea. Patient states he had a cholecystectomy done 2 years ago by Dr. Redmond Pulling and this pain is different from that.    PCP Dr. Sherren Mocha Cardiologist [name]  Past Medical History  Diagnosis Date  . Anxiety   . Depression   . GERD (gastroesophageal reflux disease)   . Gallstones   . Asthma   . PONV (postoperative nausea and vomiting)   . Paroxysmal atrial fibrillation   . Obstructive sleep apnea     not compliant with CPAP   Past Surgical History  Procedure Laterality Date  . Hand surgery    . Tonsillectomy    . Wisdom tooth extraction    . Cholecystectomy  08/05/2012    Procedure: LAPAROSCOPIC CHOLECYSTECTOMY WITH INTRAOPERATIVE CHOLANGIOGRAM;  Surgeon: [name], MD,FACS;  Location: [place];  Service: General;  Laterality: N/A;   Family History  Problem Relation Age of Onset  .  Adopted: Yes  . Family history unknown: Yes   History  Substance Use Topics  . Smoking status: Former Smoker -- 0.50 packs/day for 30 years    Types: Cigarettes    Quit date: 07/23/2006  . Smokeless tobacco: Never Used  . Alcohol Use: Yes     Comment: social  lives with spouse employed  Review of Systems  All other systems reviewed and are negative.     Allergies  Flecainide; Multaq; and Penicillins  Home Medications   Prior to Admission medications   Medication Sig Start Date End Date Taking? Authorizing Provider  albuterol (PROVENTIL HFA;VENTOLIN HFA) 108 (90 BASE) MCG/ACT inhaler Inhale 2 puffs into the lungs every 6 (six) hours as needed. Or shortness of breath 11/28/11  Yes Dorena Cookey, MD  cholestyramine Lucrezia Starch) 4 GM/DOSE powder Take 2 packets daily 09/25/13  Yes Dorena Cookey, MD  citalopram (CELEXA) 10 MG tablet Take 1 tablet (10 mg total) by mouth daily. 12/28/13  Yes Dorena Cookey, MD  clorazepate (TRANXENE) 7.5 MG tablet Take 7.5 mg by mouth at bedtime.   Yes Historical Provider, MD  esomeprazole (NEXIUM) 40 MG capsule Take 40 mg by mouth daily at 12 noon.   Yes Historical Provider, MD  ibuprofen (ADVIL,MOTRIN) 200 MG tablet Take 400 mg by mouth every 6 (six) hours as needed. For pain   Yes Historical Provider, MD  losartan (COZAAR) 50 MG tablet Take  1 tablet (50 mg total) by mouth daily. 10/29/13  Yes [name], MD  meloxicam (MOBIC) 15 MG tablet Take 15 mg by mouth daily as needed for pain.  01/19/14  Yes Historical Provider, MD  metoprolol succinate (TOPROL-XL) 50 MG 24 hr tablet Take 50 mg by mouth daily. Take with or immediately following a meal.   Yes Historical Provider, MD   ED Triage Vitals  Enc Vitals Group     BP 02/13/14 1120 153/77 mmHg     Pulse Rate 02/13/14 1120 83     Resp 02/13/14 1120 20     Temp 02/13/14 1120 98.8 F (37.1 C)     Temp src 02/13/14 1120 Oral     SpO2 02/13/14 1120 97 %     Weight 02/13/14 1120 275 lb (124.739 kg)      Height 02/13/14 1120 5\' 8"  (1.727 m)     Head Cir --      Peak Flow --      Pain Score 02/13/14 1122 10     Pain Loc --      Pain Edu? --      Excl. in [place]? --     Vital signs normal   Physical Exam  Nursing note and vitals reviewed. Constitutional: He is oriented to person, place, and time. He appears well-developed and well-nourished.  Non-toxic appearance. He does not appear ill. No distress.  Obese   HENT:  Head: Normocephalic and atraumatic.  Right Ear: External ear normal.  Left Ear: External ear normal.  Nose: Nose normal. No mucosal edema or rhinorrhea.  Mouth/Throat: Oropharynx is clear and moist and mucous membranes are normal. No dental abscesses or uvula swelling.  Eyes: Conjunctivae and EOM are normal. Pupils are equal, round, and reactive to light.  Neck: Normal range of motion and full passive range of motion without pain. Neck supple.  Cardiovascular: Normal rate, regular rhythm and normal heart sounds.  Exam reveals no gallop and no friction rub.   No murmur heard. Pulmonary/Chest: Effort normal and breath sounds normal. No respiratory distress. He has no wheezes. He has no rhonchi. He has no rales. He exhibits no tenderness and no crepitus.  Abdominal: Soft. Normal appearance and bowel sounds are normal. He exhibits no distension. There is no tenderness. There is no rebound and no guarding.    Patient has minimal epigastric pain that he's most tender in his right upper quadrant. There is no guarding or rebound.  Musculoskeletal: Normal range of motion. He exhibits no edema and no tenderness.  Moves all extremities well.   Neurological: He is alert and oriented to person, place, and time. He has normal strength. No cranial nerve deficit.  Skin: Skin is warm, dry and intact. No rash noted. No erythema. No pallor.  Psychiatric: He has a normal mood and affect. His speech is normal and behavior is normal. His mood appears not anxious.    ED Course  Procedures  (including critical care time)  Medications  0.9 %  sodium chloride infusion (0 mLs Intravenous Stopped 02/13/14 1438)    Followed by  0.9 %  sodium chloride infusion (1,000 mLs Intravenous New Bag/Given 02/13/14 1438)    Followed by  0.9 %  sodium chloride infusion (not administered)  ciprofloxacin (CIPRO) IVPB 400 mg (not administered)  metroNIDAZOLE (FLAGYL) IVPB 500 mg (500 mg Intravenous New Bag/Given 02/13/14 1443)  acetaminophen (TYLENOL) suppository 650 mg ( Rectal Canceled Entry 02/13/14 1514)  ciprofloxacin (CIPRO) IVPB 400 mg (not administered)  morphine 4 MG/ML injection 4 mg (4 mg Intravenous Given 02/13/14 1303)  ondansetron (ZOFRAN) injection 4 mg (4 mg Intravenous Given 02/13/14 1303)  iohexol (OMNIPAQUE) 300 MG/ML solution 50 mL (50 mLs Oral Contrast Given 02/13/14 1235)  iohexol (OMNIPAQUE) 300 MG/ML solution 100 mL (100 mLs Intravenous Contrast Given 02/13/14 1407)    14:26 Dr Barbie Banner called CT results.  14:36 Dr Rosana Hoes called CT results.   1430 Patient and his wife were given his CT results. Due to his allergy to penicillin he was given IV Cipro and Flagyl. He states his pain is starting to return. His pain medicine was repeated.  Nurse reports he had a fever shortly after I had seen them to give them CT results. He was given Tylenol suppository for his fever.   14:49 Dr Marlou Starks will come evaluate patient.   Labs Review Results for orders placed during the hospital encounter of 02/13/14  CBC WITH DIFFERENTIAL      Result Value Ref Range   WBC 16.1 (*) 4.0 - 10.5 K/uL   RBC 4.44  4.22 - 5.81 MIL/uL   Hemoglobin 13.6  13.0 - 17.0 g/dL   HCT 40.4  39.0 - 52.0 %   MCV 91.0  78.0 - 100.0 fL   MCH 30.6  26.0 - 34.0 pg   MCHC 33.7  30.0 - 36.0 g/dL   RDW 14.0  11.5 - 15.5 %   Platelets 198  150 - 400 K/uL   Neutrophils Relative % 85 (*) 43 - 77 %   Neutro Abs 13.7 (*) 1.7 - 7.7 K/uL   Lymphocytes Relative 5 (*) 12 - 46 %   Lymphs Abs 0.7  0.7 - 4.0 K/uL   Monocytes  Relative 10  3 - 12 %   Monocytes Absolute 1.7 (*) 0.1 - 1.0 K/uL   Eosinophils Relative 0  0 - 5 %   Eosinophils Absolute 0.0  0.0 - 0.7 K/uL   Basophils Relative 0  0 - 1 %   Basophils Absolute 0.0  0.0 - 0.1 K/uL  COMPREHENSIVE METABOLIC PANEL      Result Value Ref Range   Sodium 134 (*) 137 - 147 mEq/L   Potassium 4.2  3.7 - 5.3 mEq/L   Chloride 93 (*) 96 - 112 mEq/L   CO2 23  19 - 32 mEq/L   Glucose, Bld 125 (*) 70 - 99 mg/dL   BUN 11  6 - 23 mg/dL   Creatinine, Ser 0.81  0.50 - 1.35 mg/dL   Calcium 9.1  8.4 - 10.5 mg/dL   Total Protein 7.6  6.0 - 8.3 g/dL   Albumin 4.3  3.5 - 5.2 g/dL   AST 37  0 - 37 U/L   ALT 50  0 - 53 U/L   Alkaline Phosphatase 109  39 - 117 U/L   Total Bilirubin 1.1  0.3 - 1.2 mg/dL   GFR calc non Af Amer >90  >90 mL/min   GFR calc Af Amer >90  >90 mL/min  LIPASE, BLOOD      Result Value Ref Range   Lipase 29  11 - 59 U/L  URINALYSIS, ROUTINE W REFLEX MICROSCOPIC      Result Value Ref Range   Color, Urine YELLOW  YELLOW   APPearance CLEAR  CLEAR   Specific Gravity, Urine 1.021  1.005 - 1.030   pH 5.5  5.0 - 8.0   Glucose, UA NEGATIVE  NEGATIVE mg/dL   Hgb urine dipstick MODERATE (*)  NEGATIVE   Bilirubin Urine NEGATIVE  NEGATIVE   Ketones, ur NEGATIVE  NEGATIVE mg/dL   Protein, ur 30 (*) NEGATIVE mg/dL   Urobilinogen, UA 0.2  0.0 - 1.0 mg/dL   Nitrite NEGATIVE  NEGATIVE   Leukocytes, UA NEGATIVE  NEGATIVE  URINE MICROSCOPIC-ADD ON      Result Value Ref Range   Squamous Epithelial / LPF RARE  RARE   RBC / HPF 3-6  <3 RBC/hpf   Bacteria, UA RARE  RARE    Laboratory interpretation all normal except leukocytosis, microscopic hematuria    Imaging Review Ct Abdomen Pelvis W Contrast  02/13/2014   CLINICAL DATA:  Generalized abdominal discomfort and bloating. History of cholecystectomy.  EXAM: CT ABDOMEN AND PELVIS WITH CONTRAST  TECHNIQUE: Multidetector CT imaging of the abdomen and pelvis was performed using the standard protocol following  bolus administration of intravenous contrast.  CONTRAST:  21mL OMNIPAQUE IOHEXOL 300 MG/ML SOLN, 186mL OMNIPAQUE IOHEXOL 300 MG/ML SOLN  COMPARISON:  CT 06/27/2012.  FINDINGS: Visualization of the lower thorax demonstrates minimal dependent atelectasis. Focal atelectasis within lingula.  Liver is diffusely low in attenuation compatible with hepatic steatosis. Cholecystectomy. Spleen, pancreas and bilateral adrenal glands are unremarkable. Kidneys are symmetric in size. No hydronephrosis. No ureterolithiasis.  Normal caliber abdominal aorta no retroperitoneal lymphadenopathy.  Small fat containing left inguinal hernia. Urinary bladder is unremarkable. Prostate unremarkable.  The appendix is dilated and inflamed with periappendiceal fat stranding. The appendix measures up to 14 mm. No CT evidence for perforation  Lower lumbar spine degenerative change. No aggressive or acute appearing osseous lesions. Bilateral L5 pars defects.  IMPRESSION: Acute non perforated appendicitis.  Hepatic steatosis.  Critical Value/emergent results were called by telephone at the time of interpretation on 02/13/2014 at 2:36 PM to Dr. Rolland Porter , who verbally acknowledged these results.   Electronically Signed   By: Lovey Newcomer M.D.   On: 02/13/2014 14:38     EKG Interpretation None      MDM   Final diagnoses:  Other acute appendicitis    Plan admission  Rolland Porter, MD, Alanson Aly, MD 02/13/14 [phone]

## 2014-02-13 NOTE — Progress Notes (Signed)
Received report from Clarise Cruz, [place]. I agree with assessment. Will continue to monitor closely.

## 2014-02-13 NOTE — ED Notes (Signed)
He and his wife have just been informed by Dr. Tomi Bamberger that he has appendicitis, and will need to have an operation today by Dr. Marlou Starks.  He remains in no distress and specifically tells me he does not wish to receive any nausea or pain medicine at this time.

## 2014-02-13 NOTE — Transfer of Care (Signed)
Immediate Anesthesia Transfer of Care Note  Patient: Harold Scott  Procedure(s) Performed: Procedure(s) (LRB): APPENDECTOMY LAPAROSCOPIC (N/A)  Patient Location: PACU  Anesthesia Type: General  Level of Consciousness: sedated, patient cooperative and responds to stimulation  Airway & Oxygen Therapy: Patient Spontanous Breathing and Patient connected to face mask oxgen  Post-op Assessment: Report given to PACU RN and Post -op Vital signs reviewed and stable  Post vital signs: Reviewed and stable  Complications: No apparent anesthesia complications

## 2014-02-13 NOTE — ED Notes (Addendum)
Note:  All belongings with family.  Cipro sent to O.R. With pt.  He remains in no distress.

## 2014-02-13 NOTE — Progress Notes (Signed)
Patient setup on home CPAP. 4L O2 bled in. Pt resting comfortably. RT will continue to monitor.

## 2014-02-13 NOTE — Telephone Encounter (Signed)
Pt came into Saturday clinic with complaints of acute right abdominal pain-onset at 2 am this morning. Dr. Regis Bill spoke with pt and his family member and suggested pt be evaluated in the ER.

## 2014-02-13 NOTE — Anesthesia Postprocedure Evaluation (Signed)
  Anesthesia Post-op Note  Patient: Harold Scott  Procedure(s) Performed: Procedure(s) (LRB): APPENDECTOMY LAPAROSCOPIC (N/A)  Patient Location: PACU  Anesthesia Type: General  Level of Consciousness: awake and alert   Airway and Oxygen Therapy: Patient Spontanous Breathing  Post-op Pain: mild  Post-op Assessment: Post-op Vital signs reviewed, Patient's Cardiovascular Status Stable, Respiratory Function Stable, Patent Airway and No signs of Nausea or vomiting  Last Vitals:  Filed Vitals:   02/13/14 1900  BP:   Pulse:   Temp: 37.8 C  Resp:     Post-op Vital Signs: stable   Complications: No apparent anesthesia complications

## 2014-02-13 NOTE — H&P (Signed)
Harold Scott is an 53 y.o. male.   Chief Complaint: abdominal pain HPI: The pt is a 53 yo wm who was vacationing in [place]. He came back yesterday and developed abdominal pain last night. Pain has gotten steadily worse. He has had some nausea and vomiting with it. He is febrile to 102. He came to ER where CT shows appendicitis  Past Medical History  Diagnosis Date  . Anxiety   . Depression   . GERD (gastroesophageal reflux disease)   . Gallstones   . Asthma   . PONV (postoperative nausea and vomiting)   . Paroxysmal atrial fibrillation   . Obstructive sleep apnea     not compliant with CPAP    Past Surgical History  Procedure Laterality Date  . Hand surgery    . Tonsillectomy    . Wisdom tooth extraction    . Cholecystectomy  08/05/2012    Procedure: LAPAROSCOPIC CHOLECYSTECTOMY WITH INTRAOPERATIVE CHOLANGIOGRAM;  Surgeon: [name], MD,FACS;  Location: [place];  Service: General;  Laterality: N/A;    Family History  Problem Relation Age of Onset  . Adopted: Yes  . Family history unknown: Yes   Social History:  reports that he quit smoking about 7 years ago. His smoking use included Cigarettes. He has a 15 pack-year smoking history. He has never used smokeless tobacco. He reports that he drinks alcohol. He reports that he does not use illicit drugs.  Allergies:  Allergies  Allergen Reactions  . Flecainide Other (See Comments)    EXTREME SOB  . Multaq [Dronedarone] Shortness Of Breath  . Penicillins Hives     (Not in a hospital admission)  Results for orders placed during the hospital encounter of 02/13/14 (from the past 48 hour(s))  URINALYSIS, ROUTINE W REFLEX MICROSCOPIC     Status: Abnormal   Collection Time    02/13/14 12:31 PM      Result Value Ref Range   Color, Urine YELLOW  YELLOW   APPearance CLEAR  CLEAR   Specific Gravity, Urine 1.021  1.005 - 1.030   pH 5.5  5.0 - 8.0   Glucose, UA NEGATIVE  NEGATIVE mg/dL   Hgb urine dipstick MODERATE (*)  NEGATIVE   Bilirubin Urine NEGATIVE  NEGATIVE   Ketones, ur NEGATIVE  NEGATIVE mg/dL   Protein, ur 30 (*) NEGATIVE mg/dL   Urobilinogen, UA 0.2  0.0 - 1.0 mg/dL   Nitrite NEGATIVE  NEGATIVE   Leukocytes, UA NEGATIVE  NEGATIVE  URINE MICROSCOPIC-ADD ON     Status: None   Collection Time    02/13/14 12:31 PM      Result Value Ref Range   Squamous Epithelial / LPF RARE  RARE   RBC / HPF 3-6  <3 RBC/hpf   Bacteria, UA RARE  RARE  CBC WITH DIFFERENTIAL     Status: Abnormal   Collection Time    02/13/14  1:06 PM      Result Value Ref Range   WBC 16.1 (*) 4.0 - 10.5 K/uL   RBC 4.44  4.22 - 5.81 MIL/uL   Hemoglobin 13.6  13.0 - 17.0 g/dL   HCT 40.4  39.0 - 52.0 %   MCV 91.0  78.0 - 100.0 fL   MCH 30.6  26.0 - 34.0 pg   MCHC 33.7  30.0 - 36.0 g/dL   RDW 14.0  11.5 - 15.5 %   Platelets 198  150 - 400 K/uL   Neutrophils Relative % 85 (*) 43 -  77 %   Neutro Abs 13.7 (*) 1.7 - 7.7 K/uL   Lymphocytes Relative 5 (*) 12 - 46 %   Lymphs Abs 0.7  0.7 - 4.0 K/uL   Monocytes Relative 10  3 - 12 %   Monocytes Absolute 1.7 (*) 0.1 - 1.0 K/uL   Eosinophils Relative 0  0 - 5 %   Eosinophils Absolute 0.0  0.0 - 0.7 K/uL   Basophils Relative 0  0 - 1 %   Basophils Absolute 0.0  0.0 - 0.1 K/uL  COMPREHENSIVE METABOLIC PANEL     Status: Abnormal   Collection Time    02/13/14  1:06 PM      Result Value Ref Range   Sodium 134 (*) 137 - 147 mEq/L   Potassium 4.2  3.7 - 5.3 mEq/L   Chloride 93 (*) 96 - 112 mEq/L   CO2 23  19 - 32 mEq/L   Glucose, Bld 125 (*) 70 - 99 mg/dL   BUN 11  6 - 23 mg/dL   Creatinine, Ser 0.81  0.50 - 1.35 mg/dL   Calcium 9.1  8.4 - 10.5 mg/dL   Total Protein 7.6  6.0 - 8.3 g/dL   Albumin 4.3  3.5 - 5.2 g/dL   AST 37  0 - 37 U/L   ALT 50  0 - 53 U/L   Alkaline Phosphatase 109  39 - 117 U/L   Total Bilirubin 1.1  0.3 - 1.2 mg/dL   GFR calc non Af Amer >90  >90 mL/min   GFR calc Af Amer >90  >90 mL/min   Comment: (NOTE)     The eGFR has been calculated using the CKD  EPI equation.     This calculation has not been validated in all clinical situations.     eGFR's persistently <90 mL/min signify possible Chronic Kidney     Disease.  LIPASE, BLOOD     Status: None   Collection Time    02/13/14  1:06 PM      Result Value Ref Range   Lipase 29  11 - 59 U/L   Ct Abdomen Pelvis W Contrast  02/13/2014   CLINICAL DATA:  Generalized abdominal discomfort and bloating. History of cholecystectomy.  EXAM: CT ABDOMEN AND PELVIS WITH CONTRAST  TECHNIQUE: Multidetector CT imaging of the abdomen and pelvis was performed using the standard protocol following bolus administration of intravenous contrast.  CONTRAST:  21m OMNIPAQUE IOHEXOL 300 MG/ML SOLN, 1075mOMNIPAQUE IOHEXOL 300 MG/ML SOLN  COMPARISON:  CT 06/27/2012.  FINDINGS: Visualization of the lower thorax demonstrates minimal dependent atelectasis. Focal atelectasis within lingula.  Liver is diffusely low in attenuation compatible with hepatic steatosis. Cholecystectomy. Spleen, pancreas and bilateral adrenal glands are unremarkable. Kidneys are symmetric in size. No hydronephrosis. No ureterolithiasis.  Normal caliber abdominal aorta no retroperitoneal lymphadenopathy.  Small fat containing left inguinal hernia. Urinary bladder is unremarkable. Prostate unremarkable.  The appendix is dilated and inflamed with periappendiceal fat stranding. The appendix measures up to 14 mm. No CT evidence for perforation  Lower lumbar spine degenerative change. No aggressive or acute appearing osseous lesions. Bilateral L5 pars defects.  IMPRESSION: Acute non perforated appendicitis.  Hepatic steatosis.  Critical Value/emergent results were called by telephone at the time of interpretation on 02/13/2014 at 2:36 PM to Dr. IVRolland Porter who verbally acknowledged these results.   Electronically Signed   By: DrLovey Newcomer.D.   On: 02/13/2014 14:38    Review of Systems  Constitutional:  Positive for fever.  HENT: Negative.   Eyes: Negative.    Respiratory: Negative.   Cardiovascular: Negative.   Gastrointestinal: Positive for nausea and abdominal pain.  Genitourinary: Negative.   Musculoskeletal: Negative.   Skin: Negative.   Neurological: Negative.   Endo/Heme/Allergies: Negative.   Psychiatric/Behavioral: Negative.     Blood pressure 133/62, pulse 95, temperature 102.2 F (39 C), temperature source Oral, resp. rate 18, height _0  (1.727 m), weight 275 lb (124.739 kg), SpO2 100.00%. Physical Exam  Constitutional: He is oriented to person, place, and time. He appears well-developed and well-nourished.  HENT:  Head: Normocephalic and atraumatic.  Eyes: Conjunctivae and EOM are normal. Pupils are equal, round, and reactive to light.  Neck: Normal range of motion. Neck supple.  Cardiovascular: Normal rate, regular rhythm and normal heart sounds.   Respiratory: Effort normal and breath sounds normal.  GI: Soft. Bowel sounds are normal.  Focal right sided abdominal pain  Musculoskeletal: Normal range of motion.  Neurological: He is alert and oriented to person, place, and time.  Skin: Skin is warm and dry.  Psychiatric: He has a normal mood and affect. His behavior is normal.     Assessment/Plan The pt appears to have acute appendicitis. Because of the risk of perforation and sepsis I think he would benefit from having his appendix removed. I have discussed with him the risks and benefits of surgery as well as some of the technical aspects including the risk of leak and he understands and wishes to proceed.  TOTH III,PAUL S 02/13/2014, 3:15 PM

## 2014-02-13 NOTE — ED Notes (Signed)
Pt given a urinal and aware of need for urine specimen

## 2014-02-14 MED ORDER — SODIUM CHLORIDE 0.9 % IJ SOLN: 3.0000 mL | INTRAMUSCULAR | Status: DC | PRN

## 2014-02-14 MED ORDER — OXYCODONE-ACETAMINOPHEN 5-325 MG PO TABS: 1.0000 | ORAL_TABLET | ORAL | Status: DC | PRN

## 2014-02-14 NOTE — Discharge Instructions (Addendum)
[facility] ______[facility] LAPAROSCOPIC SURGERY: POST OP INSTRUCTIONS Always review your discharge instruction sheet given to you by the facility where your surgery was performed. IF YOU HAVE DISABILITY OR FAMILY LEAVE FORMS, YOU MUST BRING THEM TO THE OFFICE FOR PROCESSING.   DO NOT GIVE THEM TO YOUR DOCTOR.  1. A prescription for pain medication may be given to you upon discharge.  Take your pain medication as prescribed, if needed.  If narcotic pain medicine is not needed, then you may take acetaminophen (Tylenol) or ibuprofen (Advil) as needed. 2. Take your usually prescribed medications unless otherwise directed. 3. If you need a refill on your pain medication, please contact your pharmacy.  They will contact our office to request authorization. Prescriptions will not be filled after 5pm or on week-ends. 4. You should follow a light diet the first few days after arrival home, such as soup and crackers, etc.  Be sure to include lots of fluids daily. 5. Most patients will experience some swelling and bruising in the area of the incisions.  Ice packs will help.  Swelling and bruising can take several days to resolve.  6. It is common to experience some constipation if taking pain medication after surgery.  Increasing fluid intake and taking a stool softener (such as Colace) will usually help or prevent this problem from occurring.  A mild laxative (Milk of Magnesia or Miralax) should be taken according to package instructions if there are no bowel movements after 48 hours. 7. Unless discharge instructions indicate otherwise, you may remove your bandages 24-48 hours after surgery, and you may shower at that time.  You may have steri-strips (small skin tapes) in place directly over the incision.  These strips should be left on the skin for 7-10 days.  If your surgeon used skin glue on the incision, you may shower in 24 hours.  The glue will flake off over the next 2-3 weeks.  Any sutures or  staples will be removed at the office during your follow-up visit. 8. ACTIVITIES:  You may resume regular (light) daily activities beginning the next day--such as daily self-care, walking, climbing stairs--gradually increasing activities as tolerated.  You may have sexual intercourse when it is comfortable.  Refrain from any heavy lifting or straining for 2 weeks-nothing over 10 pounds.  a. You may drive when you are no longer taking prescription pain medication, you can comfortably wear a seatbelt, and you can safely maneuver your car and apply brakes. b. RETURN TO WORK:  Desk/light work in one week.  Full duty in two weeks as long as you are pain-free.__________________________________________________________ 9. You should see your doctor in the office for a follow-up appointment approximately 2-3 weeks after your surgery.  Make sure that you call for this appointment within a day or two after you arrive home to insure a convenient appointment time. 10. OTHER INSTRUCTIONS: ___For the next 2 days, wear CPAP when you are sitting or lying down.__Use spirometer 10 times per hour when awake._____________________________________________________________________________________________________________________ __________________________________________________________________________________________________________________________ WHEN TO CALL YOUR DOCTOR: 1. Fever over 101.0 2. Inability to urinate 3. Continued bleeding from incision. 4. Increased pain, redness, or drainage from the incision. 5. Increasing abdominal pain  The clinic staff is available to answer your questions during regular business hours.  Please dont hesitate to call and ask to speak to one of the nurses for clinical concerns.  If you have a medical emergency, go to the nearest emergency room or call 911.  A surgeon from [facility] is always on call at the hospital. [address] [place], [place], [place]  [address]  ? [address], [place], [place]   [address] [phone] ? [phone] ? FAX [phone] Web site: [email or web address]

## 2014-02-14 NOTE — Progress Notes (Signed)
SaO2 is 90-94 % with ambulation.  Will discharge.

## 2014-02-14 NOTE — Progress Notes (Signed)
1 Day Post-Op  Subjective: Feels good.  Voiding.  Tolerating diet.  Nurses report that Sa02 drops to 87-89 % without nasal cannula O2 or CPAP.  Objective: Vital signs in last 24 hours: Temp:  [97.7 F (36.5 C)-102.2 F (39 C)] 97.7 F (36.5 C) (06/28 0600) Pulse Rate:  [57-95] 57 (06/28 0600) Resp:  [16-29] 18 (06/28 0600) BP: (117-153)/(60-80) 117/72 mmHg (06/28 0600) SpO2:  [87 %-100 %] 94 % (06/28 0805) Weight:  [275 lb (124.739 kg)] 275 lb (124.739 kg) (06/27 1120) Last BM Date: 02/13/14  Intake/Output from previous day: 06/27 0701 - 06/28 0700 In: 1040 [P.O.:240; I.V.:800] Out: 927 [Urine:925; Stool:2] Intake/Output this shift:    PE: General- In NAD Lungs-decreased breath sounds in bases Abdomen-soft, incisions are clean and intact  Lab Results:   Recent Labs  02/13/14 1306  WBC 16.1*  HGB 13.6  HCT 40.4  PLT 198   BMET  Recent Labs  02/13/14 1306  NA 134*  K 4.2  CL 93*  CO2 23  GLUCOSE 125*  BUN 11  CREATININE 0.81  CALCIUM 9.1   PT/INR No results found for this basename: LABPROT, INR,  in the last 72 hours Comprehensive Metabolic Panel:    Component Value Date/Time   NA 134* 02/13/2014 1306   NA 134* 10/23/2013 1451   K 4.2 02/13/2014 1306   K 3.5 10/23/2013 1451   CL 93* 02/13/2014 1306   CL 99 10/23/2013 1451   CO2 23 02/13/2014 1306   CO2 25 10/23/2013 1451   BUN 11 02/13/2014 1306   BUN 9 10/23/2013 1451   CREATININE 0.81 02/13/2014 1306   CREATININE 0.8 10/23/2013 1451   GLUCOSE 125* 02/13/2014 1306   GLUCOSE 116* 10/23/2013 1451   CALCIUM 9.1 02/13/2014 1306   CALCIUM 9.2 10/23/2013 1451   AST 37 02/13/2014 1306   AST 31 07/28/2012 1530   ALT 50 02/13/2014 1306   ALT 31 07/28/2012 1530   ALKPHOS 109 02/13/2014 1306   ALKPHOS 81 07/28/2012 1530   BILITOT 1.1 02/13/2014 1306   BILITOT 0.5 07/28/2012 1530   PROT 7.6 02/13/2014 1306   PROT 6.7 07/28/2012 1530   ALBUMIN 4.3 02/13/2014 1306   ALBUMIN 3.7 07/28/2012 1530     Studies/Results: Ct Abdomen  Pelvis W Contrast  02/13/2014   CLINICAL DATA:  Generalized abdominal discomfort and bloating. History of cholecystectomy.  EXAM: CT ABDOMEN AND PELVIS WITH CONTRAST  TECHNIQUE: Multidetector CT imaging of the abdomen and pelvis was performed using the standard protocol following bolus administration of intravenous contrast.  CONTRAST:  4mL OMNIPAQUE IOHEXOL 300 MG/ML SOLN, 184mL OMNIPAQUE IOHEXOL 300 MG/ML SOLN  COMPARISON:  CT 06/27/2012.  FINDINGS: Visualization of the lower thorax demonstrates minimal dependent atelectasis. Focal atelectasis within lingula.  Liver is diffusely low in attenuation compatible with hepatic steatosis. Cholecystectomy. Spleen, pancreas and bilateral adrenal glands are unremarkable. Kidneys are symmetric in size. No hydronephrosis. No ureterolithiasis.  Normal caliber abdominal aorta no retroperitoneal lymphadenopathy.  Small fat containing left inguinal hernia. Urinary bladder is unremarkable. Prostate unremarkable.  The appendix is dilated and inflamed with periappendiceal fat stranding. The appendix measures up to 14 mm. No CT evidence for perforation  Lower lumbar spine degenerative change. No aggressive or acute appearing osseous lesions. Bilateral L5 pars defects.  IMPRESSION: Acute non perforated appendicitis.  Hepatic steatosis.  Critical Value/emergent results were called by telephone at the time of interpretation on 02/13/2014 at 2:36 PM to Dr. Rolland Porter , who verbally acknowledged these results.  Electronically Signed   By: Lovey Newcomer M.D.   On: 02/13/2014 14:38    Anti-infectives: Anti-infectives   Start     Dose/Rate Route Frequency Ordered Stop   02/14/14 0600  [MAR Hold]  ciprofloxacin (CIPRO) IVPB 400 mg  Status:  Discontinued     (On MAR Hold since 02/13/14 1602)   400 mg 200 mL/hr over 60 Minutes Intravenous Every 12 hours 02/13/14 1515 02/13/14 1940   02/13/14 1430  [MAR Hold]  ciprofloxacin (CIPRO) IVPB 400 mg     (On MAR Hold since 02/13/14 1602)   400  mg 200 mL/hr over 60 Minutes Intravenous  Once 02/13/14 1427 02/13/14 1600   02/13/14 1430  metroNIDAZOLE (FLAGYL) IVPB 500 mg     500 mg 100 mL/hr over 60 Minutes Intravenous  Once 02/13/14 1427 02/13/14 1543      Assessment Principal Problem:   Acute appendicitis s/p lap appendectomy-doing well from this standpoint. Active Problems:   Mild hypoxia likely related to some atelectasis as well as obesity and OSA   Obesity   Obstructive sleep apnea       LOS: 1 day   Plan:   Increase activity and incentive spirometry.  If SaO2 improves will discharge without oxygen.  If not, may need home O2 briefly.  Discharge instructions given to him.   Harold Scott Lenna Sciara 02/14/2014

## 2014-02-14 NOTE — Progress Notes (Signed)
Patient on room air up walking and sitting upright at bedside oxygen sat 92 %. Pt instructed to at home wear c-pap if is going to recline,sleep or even sit for an hour or more. Frequent use of  Incentive spirometer encouraged for here and at home. Pt able to demonstrate its use and IS =2250. Patient is in agreement to continue these safety measures at home.

## 2014-02-14 NOTE — Discharge Summary (Signed)
Physician Discharge Summary  Patient ID: Harold Scott MRN: [id number] DOB/AGE: 1960/12/07 53 y.o.  Admit date: 02/13/2014 Discharge date: 02/14/2014  Admission Diagnoses:  Acute appendicitis  Discharge Diagnoses:  Principal Problem:   Acute appendicitis Active Problems:   Obesity   Obstructive sleep apnea     Discharged Condition: good  Hospital Course: He was admitted and underwent laparoscopic appendectomy. On postop day 1, he had some borderline hypoxia while sitting in bed. He began ambulating and this resolved. He subsequently was discharged. Discharge instructions were given to him.  Consults: None  Significant Diagnostic Studies: none  Treatments: surgery: Laparoscopic appendectomy  Discharge Exam: Blood pressure 132/82, pulse 71, temperature 97.7 F (36.5 C), temperature source Oral, resp. rate 18, height 5\' 8"  (1.727 m), weight 275 lb (124.739 kg), SpO2 92.00%.   Disposition: 01-Home or Self Care     Medication List         albuterol 108 (90 BASE) MCG/ACT inhaler  Commonly known as:  PROVENTIL HFA;VENTOLIN HFA  Inhale 2 puffs into the lungs every 6 (six) hours as needed. Or shortness of breath     cholestyramine 4 GM/DOSE powder  Commonly known as:  QUESTRAN  Take 2 packets daily     citalopram 10 MG tablet  Commonly known as:  CELEXA  Take 1 tablet (10 mg total) by mouth daily.     clorazepate 7.5 MG tablet  Commonly known as:  TRANXENE  Take 7.5 mg by mouth at bedtime.     esomeprazole 40 MG capsule  Commonly known as:  NEXIUM  Take 40 mg by mouth daily at 12 noon.     ibuprofen 200 MG tablet  Commonly known as:  ADVIL,MOTRIN  Take 400 mg by mouth every 6 (six) hours as needed. For pain     losartan 50 MG tablet  Commonly known as:  COZAAR  Take 1 tablet (50 mg total) by mouth daily.     meloxicam 15 MG tablet  Commonly known as:  MOBIC  Take 15 mg by mouth daily as needed for pain.     metoprolol succinate 50 MG 24 hr tablet   Commonly known as:  TOPROL-XL  Take 50 mg by mouth daily. Take with or immediately following a meal.     oxyCODONE-acetaminophen 5-325 MG per tablet  Commonly known as:  PERCOCET/ROXICET  Take 1-2 tablets by mouth every 4 (four) hours as needed for moderate pain.         Signed: Odis Hollingshead 02/14/2014, 3:01 PM

## 2014-02-15 ENCOUNTER — Encounter (HOSPITAL_COMMUNITY): Payer: Self-pay | Admitting: General Surgery

## 2014-03-02 ENCOUNTER — Ambulatory Visit (INDEPENDENT_AMBULATORY_CARE_PROVIDER_SITE_OTHER): Payer: PRIVATE HEALTH INSURANCE | Admitting: General Surgery

## 2014-03-02 ENCOUNTER — Encounter (INDEPENDENT_AMBULATORY_CARE_PROVIDER_SITE_OTHER): Payer: Self-pay | Admitting: General Surgery

## 2014-03-02 VITALS — BP 117/86 | HR 77 | Temp 97.6°F | Resp 18 | Ht 68.0 in | Wt 274.4 lb

## 2014-03-02 DIAGNOSIS — K353 Acute appendicitis with localized peritonitis, without perforation or gangrene: Secondary | ICD-10-CM

## 2014-03-02 DIAGNOSIS — K3533 Acute appendicitis with perforation and localized peritonitis, with abscess: Secondary | ICD-10-CM

## 2014-03-02 NOTE — Progress Notes (Signed)
Subjective:     Patient ID: Harold Scott, male   DOB: 1960/09/12, 53 y.o.   MRN: [id number]  HPI The patient is a 53 year old white male who is about 3 weeks status post laparoscopic appendectomy for appendicitis. He feels great and has no complaints today. His appetite is good and his bowels are working normally.  Review of Systems     Objective:   Physical Exam On exam his abdomen is soft and nontender. His incisions are all healing nicely with no sign of infection.    Assessment:     The patient is 3 weeks status post laparoscopic appendectomy for appendicitis     Plan:     At this point I would like to refrain from any heavy lifting for about 3 more weeks and then he may return to normal activities without restriction. I will plan to see him back on a when necessary basis

## 2014-03-02 NOTE — Patient Instructions (Signed)
May return to normal activities in about 3 weeks

## 2014-03-11 ENCOUNTER — Encounter: Payer: Self-pay | Admitting: Internal Medicine

## 2014-03-11 ENCOUNTER — Ambulatory Visit (INDEPENDENT_AMBULATORY_CARE_PROVIDER_SITE_OTHER): Payer: PRIVATE HEALTH INSURANCE | Admitting: Internal Medicine

## 2014-03-11 VITALS — BP 146/84 | Temp 99.7°F | Ht 68.0 in | Wt 278.0 lb

## 2014-03-11 DIAGNOSIS — B029 Zoster without complications: Secondary | ICD-10-CM

## 2014-03-11 MED ORDER — HYDROCODONE-ACETAMINOPHEN 10-325 MG PO TABS: 1.0000 | ORAL_TABLET | Freq: Three times a day (TID) | ORAL | Status: DC | PRN

## 2014-03-11 MED ORDER — VALACYCLOVIR HCL 1 G PO TABS: 1000.0000 mg | ORAL_TABLET | Freq: Three times a day (TID) | ORAL | Status: DC

## 2014-03-11 MED ORDER — GABAPENTIN 100 MG PO CAPS: 100.0000 mg | ORAL_CAPSULE | Freq: Two times a day (BID) | ORAL | Status: DC

## 2014-03-11 NOTE — Progress Notes (Signed)
Subjective:    Patient ID: Harold Scott, male    DOB: Jun 16, 1961, 53 y.o.   MRN: [id number]  HPI  53 year old patient who presents with a painful vesicular eruption involving his right facial and peri-Orbital area.  Past Medical History  Diagnosis Date  . Anxiety   . Depression   . GERD (gastroesophageal reflux disease)   . Gallstones   . Asthma   . PONV (postoperative nausea and vomiting)   . Paroxysmal atrial fibrillation   . Obstructive sleep apnea     not compliant with CPAP    History   Social History  . Marital Status: Married    Spouse Name: N/A    Number of Children: 1  . Years of Education: N/A   Occupational History  . Courier    Social History Main Topics  . Smoking status: Former Smoker -- 0.50 packs/day for 30 years    Types: Cigarettes    Quit date: 07/23/2006  . Smokeless tobacco: Never Used  . Alcohol Use: Yes     Comment: social  . Drug Use: No  . Sexual Activity: Not on file   Other Topics Concern  . Not on file   Social History Narrative   Pt works as a Secondary school teacher    Past Surgical History  Procedure Laterality Date  . Hand surgery    . Tonsillectomy    . Wisdom tooth extraction    . Cholecystectomy  08/05/2012    Procedure: LAPAROSCOPIC CHOLECYSTECTOMY WITH INTRAOPERATIVE CHOLANGIOGRAM;  Surgeon: [name], MD,FACS;  Location: [facility];  Service: General;  Laterality: N/A;  . Laparoscopic appendectomy N/A 02/13/2014    Procedure: APPENDECTOMY LAPAROSCOPIC;  Surgeon: [name], MD;  Location: [facility];  Service: General;  Laterality: N/A;    Family History  Problem Relation Age of Onset  . Adopted: Yes    Allergies  Allergen Reactions  . Flecainide Other (See Comments)    EXTREME SOB  . Multaq [Dronedarone] Shortness Of Breath  . Penicillins Hives    Current Outpatient Prescriptions on File Prior to Visit  Medication Sig Dispense Refill  . albuterol (PROVENTIL HFA;VENTOLIN HFA) 108 (90 BASE) MCG/ACT inhaler Inhale 2  puffs into the lungs every 6 (six) hours as needed. Or shortness of breath      . cholestyramine (QUESTRAN) 4 GM/DOSE powder Take 2 packets daily  378 g  10  . citalopram (CELEXA) 10 MG tablet Take 1 tablet (10 mg total) by mouth daily.  90 tablet  2  . clorazepate (TRANXENE) 7.5 MG tablet Take 7.5 mg by mouth at bedtime.      Marland Kitchen esomeprazole (NEXIUM) 40 MG capsule Take 40 mg by mouth daily at 12 noon.      Marland Kitchen ibuprofen (ADVIL,MOTRIN) 200 MG tablet Take 400 mg by mouth every 6 (six) hours as needed. For pain      . losartan (COZAAR) 50 MG tablet Take 1 tablet (50 mg total) by mouth daily.  90 tablet  3  . metoprolol succinate (TOPROL-XL) 50 MG 24 hr tablet Take 50 mg by mouth daily. Take with or immediately following a meal.       No current facility-administered medications on file prior to visit.    BP 146/84  Temp(Src) 99.7 F (37.6 C) (Oral)  Ht 5\' 8"  (1.727 m)  Wt 278 lb (126.1 kg)  BMI 42.28 kg/m2     Review of Systems  Skin: Positive for rash.  Objective:   Physical Exam  Constitutional: He appears well-developed and well-nourished.  Uncomfortable  Skin:  Vesicular rash involving the right forehead area, mild.  The right upper lid edema.  Conjunctiva clear Tender right submandibular adenopathy Right tympanic membrane normal Nonspecific small lesion involving the right hard palate area           Assessment & Plan:   Shingles.  We'll treat with the Valtrex, analgesics, and gabapentin.  Information dispensed

## 2014-03-11 NOTE — Patient Instructions (Signed)
Call or return to clinic prn if these symptoms worsen or fail to improve as anticipated.  Shingles Shingles (herpes zoster) is an infection that is caused by the same virus that causes chickenpox (varicella). The infection causes a painful skin rash and fluid-filled blisters, which eventually break open, crust over, and heal. It may occur in any area of the body, but it usually affects only one side of the body or face. The pain of shingles usually lasts about 1 month. However, some people with shingles may develop long-term (chronic) pain in the affected area of the body. Shingles often occurs many years after the person had chickenpox. It is more common:  In people older than 50 years.  In people with weakened immune systems, such as those with HIV, AIDS, or cancer.  In people taking medicines that weaken the immune system, such as transplant medicines.  In people under great stress. CAUSES  Shingles is caused by the varicella zoster virus (VZV), which also causes chickenpox. After a person is infected with the virus, it can remain in the person's body for years in an inactive state (dormant). To cause shingles, the virus reactivates and breaks out as an infection in a nerve root. The virus can be spread from person to person (contagious) through contact with open blisters of the shingles rash. It will only spread to people who have not had chickenpox. When these people are exposed to the virus, they may develop chickenpox. They will not develop shingles. Once the blisters scab over, the person is no longer contagious and cannot spread the virus to others. SIGNS AND SYMPTOMS  Shingles shows up in stages. The initial symptoms may be pain, itching, and tingling in an area of the skin. This pain is usually described as burning, stabbing, or throbbing.In a few days or weeks, a painful red rash will appear in the area where the pain, itching, and tingling were felt. The rash is usually on one side of  the body in a band or belt-like pattern. Then, the rash usually turns into fluid-filled blisters. They will scab over and dry up in approximately 2-3 weeks. Flu-like symptoms may also occur with the initial symptoms, the rash, or the blisters. These may include:  Fever.  Chills.  Headache.  Upset stomach. DIAGNOSIS  Your health care provider will perform a skin exam to diagnose shingles. Skin scrapings or fluid samples may also be taken from the blisters. This sample will be examined under a microscope or sent to a lab for further testing. TREATMENT  There is no specific cure for shingles. Your health care provider will likely prescribe medicines to help you manage the pain, recover faster, and avoid long-term problems. This may include antiviral drugs, anti-inflammatory drugs, and pain medicines. HOME CARE INSTRUCTIONS   Take a cool bath or apply cool compresses to the area of the rash or blisters as directed. This may help with the pain and itching.   Take medicines only as directed by your health care provider.   Rest as directed by your health care provider.  Keep your rash and blisters clean with mild soap and cool water or as directed by your health care provider.  Do not pick your blisters or scratch your rash. Apply an anti-itch cream or numbing creams to the affected area as directed by your health care provider.  Keep your shingles rash covered with a loose bandage (dressing).  Avoid skin contact with:  Babies.   Pregnant women.   Children   with eczema.   Elderly people with transplants.   People with chronic illnesses, such as leukemia or AIDS.   Wear loose-fitting clothing to help ease the pain of material rubbing against the rash.  Keep all follow-up visits as directed by your health care provider.If the area involved is on your face, you may receive a referral for a specialist, such as an eye doctor (ophthalmologist) or an ear, nose, and throat (ENT)  doctor. Keeping all follow-up visits will help you avoid eye problems, chronic pain, or disability.  SEEK IMMEDIATE MEDICAL CARE IF:   You have facial pain, pain around the eye area, or loss of feeling on one side of your face.  You have ear pain or ringing in your ear.  You have loss of taste.  Your pain is not relieved with prescribed medicines.   Your redness or swelling spreads.   You have more pain and swelling.  Your condition is worsening or has changed.   You have a fever. MAKE SURE YOU:  Understand these instructions.  Will watch your condition.  Will get help right away if you are not doing well or get worse. Document Released: 08/06/2005 Document Revised: 12/21/2013 Document Reviewed: 03/20/2012 Exit[employer] Patient Information 2015 [employer]. This information is not intended to replace advice given to you by your health care provider. Make sure you discuss any questions you have with your health care provider.  

## 2014-03-11 NOTE — Progress Notes (Signed)
Pre visit review using our clinic review tool, if applicable. No additional management support is needed unless otherwise documented below in the visit note. 

## 2014-03-12 ENCOUNTER — Telehealth: Payer: Self-pay | Admitting: Family Medicine

## 2014-03-12 NOTE — Telephone Encounter (Signed)
Dr. K aware. 

## 2014-03-12 NOTE — Telephone Encounter (Signed)
Caller: Harold Scott/Patient; Phone: [phone]; Reason for Call: Seen in the office 03/11/14 by Dr.  Raliegh Ip; diagnosed with shingles to the rt side of his face; started on Valtrex, Oxycodone and Gabapentin; says the eyelid is swollen and wondering if ice would be ok on it; informed him that ice should be ok to use for 25min time frames

## 2014-03-17 ENCOUNTER — Other Ambulatory Visit: Payer: Self-pay | Admitting: Family Medicine

## 2014-03-25 ENCOUNTER — Telehealth: Payer: Self-pay | Admitting: *Deleted

## 2014-03-25 NOTE — Telephone Encounter (Signed)
Patient was seen last week for shingles on his face.  He is now having a lot of drainage from his eye and sinuses that cause him to feel nauseous.  Please advise.

## 2014-04-28 ENCOUNTER — Ambulatory Visit (INDEPENDENT_AMBULATORY_CARE_PROVIDER_SITE_OTHER): Payer: PRIVATE HEALTH INSURANCE | Admitting: Pulmonary Disease

## 2014-04-28 ENCOUNTER — Encounter: Payer: Self-pay | Admitting: Pulmonary Disease

## 2014-04-28 VITALS — BP 124/72 | HR 61 | Temp 97.2°F | Ht 68.0 in | Wt 278.2 lb

## 2014-04-28 DIAGNOSIS — G4733 Obstructive sleep apnea (adult) (pediatric): Secondary | ICD-10-CM

## 2014-04-28 NOTE — Patient Instructions (Signed)
Continue on cpap, and keep up with the mask changes and supplies Take your machine by your home care company so they can run diagnostics and make sure there is not something wrong. Work on weight loss followup with me again in 43mos, but call if having issues with your device.

## 2014-04-28 NOTE — Assessment & Plan Note (Signed)
The patient had been doing extremely well on CPAP, with excellent compliance and control of his AHI by his download. The last 2 nights he has had issues with on regulated pressure changes, and I think he needs to take his device by his home care company for diagnostics. I have also encouraged him to work aggressively on weight loss, and to keep up with his mask changes and supplies.

## 2014-04-28 NOTE — Progress Notes (Signed)
   Subjective:    Patient ID: Harold Scott, male    DOB: 08-Jun-1961, 53 y.o.   MRN: [id number]  HPI The patient comes in today for followup of his obstructive sleep apnea. He is wearing CPAP compliantly by his download, with excellent control of his obstructive events. He has been doing very well with CPAP, with excellent sleep and daytime alertness. The last 2 nights however, his machine has been acting strangely, with rapid pressure increases for no reason.   Review of Systems  Constitutional: Negative for fever and unexpected weight change.  HENT: Negative for congestion, dental problem, ear pain, nosebleeds, postnasal drip, rhinorrhea, sinus pressure, sneezing, sore throat and trouble swallowing.   Eyes: Negative for redness and itching.  Respiratory: Negative for cough, chest tightness, shortness of breath and wheezing.   Cardiovascular: Negative for palpitations and leg swelling.  Gastrointestinal: Negative for nausea and vomiting.  Genitourinary: Negative for dysuria.  Musculoskeletal: Negative for joint swelling.  Skin: Negative for rash.  Neurological: Negative for headaches.  Hematological: Does not bruise/bleed easily.  Psychiatric/Behavioral: Negative for dysphoric mood. The patient is not nervous/anxious.        Objective:   Physical Exam Obese male in no acute distress Nose without purulence or discharge noted Neck without lymphadenopathy or thyromegaly No skin breakdown or pressure necrosis from the CPAP mask Lower extremities with mild edema, no cyanosis Alert and oriented, moves all 4 extremities.       Assessment & Plan:

## 2014-06-03 ENCOUNTER — Other Ambulatory Visit: Payer: Self-pay | Admitting: Family Medicine

## 2014-07-06 ENCOUNTER — Ambulatory Visit (INDEPENDENT_AMBULATORY_CARE_PROVIDER_SITE_OTHER): Payer: PRIVATE HEALTH INSURANCE

## 2014-07-06 ENCOUNTER — Other Ambulatory Visit: Payer: Self-pay | Admitting: Family Medicine

## 2014-07-06 ENCOUNTER — Ambulatory Visit: Payer: PRIVATE HEALTH INSURANCE

## 2014-07-06 DIAGNOSIS — Z23 Encounter for immunization: Secondary | ICD-10-CM

## 2014-07-08 ENCOUNTER — Other Ambulatory Visit: Payer: Self-pay | Admitting: Family Medicine

## 2014-07-09 ENCOUNTER — Telehealth: Payer: Self-pay | Admitting: Family Medicine

## 2014-07-09 ENCOUNTER — Other Ambulatory Visit: Payer: Self-pay | Admitting: Family Medicine

## 2014-07-09 MED ORDER — CLORAZEPATE DIPOTASSIUM 7.5 MG PO TABS: 7.5000 mg | ORAL_TABLET | Freq: Every day | ORAL | Status: DC

## 2014-07-09 NOTE — Telephone Encounter (Signed)
Pt request refill clorazepate (TRANXENE) 7.5 MG tablet Pt states he is completley out and needs asap. [facility]

## 2014-07-09 NOTE — Telephone Encounter (Signed)
rx sent

## 2014-07-18 ENCOUNTER — Encounter (HOSPITAL_COMMUNITY): Payer: Self-pay

## 2014-07-18 ENCOUNTER — Inpatient Hospital Stay (HOSPITAL_COMMUNITY)
Admission: EM | Admit: 2014-07-18 | Discharge: 2014-07-23 | DRG: 309 | Disposition: A | Discharge status: Home / Self Care | Payer: PRIVATE HEALTH INSURANCE | Attending: Internal Medicine | Admitting: Internal Medicine

## 2014-07-18 ENCOUNTER — Emergency Department (HOSPITAL_COMMUNITY): Payer: PRIVATE HEALTH INSURANCE

## 2014-07-18 DIAGNOSIS — I10 Essential (primary) hypertension: Secondary | ICD-10-CM | POA: Diagnosis present

## 2014-07-18 DIAGNOSIS — I480 Paroxysmal atrial fibrillation: Principal | ICD-10-CM | POA: Diagnosis present

## 2014-07-18 DIAGNOSIS — R160 Hepatomegaly, not elsewhere classified: Secondary | ICD-10-CM

## 2014-07-18 DIAGNOSIS — Z888 Allergy status to other drugs, medicaments and biological substances status: Secondary | ICD-10-CM

## 2014-07-18 DIAGNOSIS — F329 Major depressive disorder, single episode, unspecified: Secondary | ICD-10-CM | POA: Diagnosis present

## 2014-07-18 DIAGNOSIS — R0789 Other chest pain: Secondary | ICD-10-CM

## 2014-07-18 DIAGNOSIS — E871 Hypo-osmolality and hyponatremia: Secondary | ICD-10-CM | POA: Diagnosis present

## 2014-07-18 DIAGNOSIS — R946 Abnormal results of thyroid function studies: Secondary | ICD-10-CM | POA: Diagnosis not present

## 2014-07-18 DIAGNOSIS — K219 Gastro-esophageal reflux disease without esophagitis: Secondary | ICD-10-CM | POA: Diagnosis present

## 2014-07-18 DIAGNOSIS — Z9119 Patient's noncompliance with other medical treatment and regimen: Secondary | ICD-10-CM | POA: Diagnosis present

## 2014-07-18 DIAGNOSIS — F419 Anxiety disorder, unspecified: Secondary | ICD-10-CM | POA: Diagnosis present

## 2014-07-18 DIAGNOSIS — D72829 Elevated white blood cell count, unspecified: Secondary | ICD-10-CM | POA: Diagnosis present

## 2014-07-18 DIAGNOSIS — G4733 Obstructive sleep apnea (adult) (pediatric): Secondary | ICD-10-CM | POA: Diagnosis present

## 2014-07-18 DIAGNOSIS — E785 Hyperlipidemia, unspecified: Secondary | ICD-10-CM | POA: Diagnosis present

## 2014-07-18 DIAGNOSIS — E669 Obesity, unspecified: Secondary | ICD-10-CM | POA: Diagnosis present

## 2014-07-18 DIAGNOSIS — I4819 Other persistent atrial fibrillation: Secondary | ICD-10-CM | POA: Diagnosis present

## 2014-07-18 DIAGNOSIS — K760 Fatty (change of) liver, not elsewhere classified: Secondary | ICD-10-CM | POA: Diagnosis present

## 2014-07-18 DIAGNOSIS — I4891 Unspecified atrial fibrillation: Secondary | ICD-10-CM

## 2014-07-18 DIAGNOSIS — J45909 Unspecified asthma, uncomplicated: Secondary | ICD-10-CM | POA: Diagnosis present

## 2014-07-18 DIAGNOSIS — Z880 Allergy status to penicillin: Secondary | ICD-10-CM

## 2014-07-18 DIAGNOSIS — Z7982 Long term (current) use of aspirin: Secondary | ICD-10-CM

## 2014-07-18 DIAGNOSIS — Z79899 Other long term (current) drug therapy: Secondary | ICD-10-CM

## 2014-07-18 DIAGNOSIS — Z9049 Acquired absence of other specified parts of digestive tract: Secondary | ICD-10-CM | POA: Diagnosis present

## 2014-07-18 DIAGNOSIS — I5032 Chronic diastolic (congestive) heart failure: Secondary | ICD-10-CM | POA: Diagnosis present

## 2014-07-18 DIAGNOSIS — R079 Chest pain, unspecified: Secondary | ICD-10-CM | POA: Diagnosis present

## 2014-07-18 DIAGNOSIS — Z87891 Personal history of nicotine dependence: Secondary | ICD-10-CM

## 2014-07-18 DIAGNOSIS — Z6841 Body Mass Index (BMI) 40.0 and over, adult: Secondary | ICD-10-CM

## 2014-07-18 LAB — BASIC METABOLIC PANEL
Anion gap: 14 (ref 5–15)
BUN: 8 mg/dL (ref 6–23)
CHLORIDE: 94 meq/L — AB (ref 96–112)
CO2: 25 meq/L (ref 19–32)
CREATININE: 0.72 mg/dL (ref 0.50–1.35)
Calcium: 9.3 mg/dL (ref 8.4–10.5)
GFR calc Af Amer: >90 mL/min (ref >90)
GFR calc non Af Amer: >90 mL/min (ref >90)
Glucose, Bld: 109 mg/dL — ABNORMAL HIGH (ref 70–99)
Potassium: 4 mEq/L (ref 3.7–5.3)
Sodium: 133 mEq/L — ABNORMAL LOW (ref 137–147)

## 2014-07-18 LAB — CBC
HCT: 42.6 % (ref 39.0–52.0)
Hemoglobin: 14.5 g/dL (ref 13.0–17.0)
MCH: 30.7 pg (ref 26.0–34.0)
MCHC: 34 g/dL (ref 30.0–36.0)
MCV: 90.3 fL (ref 78.0–100.0)
Platelets: 250 10*3/uL (ref 150–400)
RBC: 4.72 MIL/uL (ref 4.22–5.81)
RDW: 13.1 % (ref 11.5–15.5)
WBC: 12.1 10*3/uL — AB (ref 4.0–10.5)

## 2014-07-18 LAB — I-STAT TROPONIN, ED: Troponin i, poc: 0.02 ng/mL (ref 0.00–0.08)

## 2014-07-18 LAB — PRO B NATRIURETIC PEPTIDE: PRO B NATRI PEPTIDE: 1325 pg/mL — AB (ref 0–125)

## 2014-07-18 LAB — LIPASE, BLOOD: LIPASE: 30 U/L (ref 11–59)

## 2014-07-18 MED ORDER — HYDROMORPHONE HCL 1 MG/ML IJ SOLN
1.0000 mg | Freq: Once | INTRAMUSCULAR | Status: AC
Start: 2014-07-18 — End: 2014-07-18
  Administered 2014-07-18: 1 mg via INTRAVENOUS
  Filled 2014-07-18: qty 1

## 2014-07-18 MED ORDER — GI COCKTAIL ~~LOC~~
30.0000 mL | Freq: Once | ORAL | Status: AC
  Administered 2014-07-18: 30 mL via ORAL
  Filled 2014-07-18: qty 30

## 2014-07-18 NOTE — ED Provider Notes (Signed)
CSN: [id number]     Arrival date & time 07/18/14  2142 History   First MD Initiated Contact with Patient 07/18/14 2201     Chief Complaint  Patient presents with  . Chest Pain  . Abdominal Pain     (Consider location/radiation/quality/duration/timing/severity/associated sxs/prior Treatment) Patient is a 53 y.o. male presenting with chest pain and abdominal pain.  Chest Pain Pain location:  Substernal area Pain quality: burning   Pain radiates to:  Does not radiate Pain radiates to the back: no   Pain severity:  Moderate Onset quality:  Sudden Associated symptoms: abdominal pain   Abdominal Pain Associated symptoms: chest pain     Past Medical History  Diagnosis Date  . Anxiety   . Depression   . GERD (gastroesophageal reflux disease)   . Gallstones   . Asthma   . PONV (postoperative nausea and vomiting)   . Paroxysmal atrial fibrillation   . Obstructive sleep apnea     not compliant with CPAP   Past Surgical History  Procedure Laterality Date  . Hand surgery    . Tonsillectomy    . Wisdom tooth extraction    . Cholecystectomy  08/05/2012    Procedure: LAPAROSCOPIC CHOLECYSTECTOMY WITH INTRAOPERATIVE CHOLANGIOGRAM;  Surgeon: [name], MD,FACS;  Location: [place];  Service: General;  Laterality: N/A;  . Laparoscopic appendectomy N/A 02/13/2014    Procedure: APPENDECTOMY LAPAROSCOPIC;  Surgeon: [name], MD;  Location: [facility];  Service: General;  Laterality: N/A;   Family History  Problem Relation Age of Onset  . Adopted: Yes   History  Substance Use Topics  . Smoking status: Former Smoker -- 0.50 packs/day for 30 years    Types: Cigarettes    Quit date: 07/23/2006  . Smokeless tobacco: Never Used  . Alcohol Use: Yes     Comment: social    Review of Systems  Cardiovascular: Positive for chest pain.  Gastrointestinal: Positive for abdominal pain.  All other systems reviewed and are negative.     Allergies  Flecainide; Multaq; and  Penicillins  Home Medications   Prior to Admission medications   Medication Sig Start Date End Date Taking? Authorizing Provider  albuterol (PROVENTIL HFA;VENTOLIN HFA) 108 (90 BASE) MCG/ACT inhaler Inhale 2 puffs into the lungs every 6 (six) hours as needed. Or shortness of breath 11/28/11  Yes Dorena Cookey, MD  cholestyramine Lucrezia Starch) 4 G packet TAKE 2 PACKETS DAILY 06/03/14  Yes Dorena Cookey, MD  citalopram (CELEXA) 10 MG tablet Take 1 tablet (10 mg total) by mouth daily. 12/28/13  Yes Dorena Cookey, MD  clorazepate (TRANXENE) 7.5 MG tablet Take 1 tablet (7.5 mg total) by mouth at bedtime. 07/09/14  Yes Dorena Cookey, MD  esomeprazole (NEXIUM) 40 MG capsule Take 40 mg by mouth daily at 12 noon.   Yes Historical Provider, MD  guaiFENesin 200 MG tablet Take 200 mg by mouth 2 (two) times daily as needed (for cold).   Yes Historical Provider, MD  ibuprofen (ADVIL,MOTRIN) 200 MG tablet Take 400 mg by mouth every 6 (six) hours as needed. For pain   Yes Historical Provider, MD  losartan (COZAAR) 50 MG tablet Take 1 tablet (50 mg total) by mouth daily. 10/29/13  Yes [name], MD  metoprolol succinate (TOPROL-XL) 50 MG 24 hr tablet TAKE 1 TABLET EVERY DAY 07/06/14  Yes Dorena Cookey, MD   BP 140/96 mmHg  Pulse 88  Temp(Src) 97.7 F (36.5 C) (Oral)  Resp 16  Ht 5\' 7"  (1.702 m)  Wt 280 lb 3.2 oz (127.098 kg)  BMI 43.88 kg/m2  SpO2 98% Physical Exam  Constitutional: He is oriented to person, place, and time. He appears well-developed and well-nourished.  HENT:  Head: Normocephalic and atraumatic.  Eyes: Conjunctivae and EOM are normal.  Neck: Normal range of motion. Neck supple.  Cardiovascular: Regular rhythm and normal heart sounds.  Tachycardia present.   Pulmonary/Chest: Effort normal and breath sounds normal. No respiratory distress.  Abdominal: He exhibits no distension. There is no tenderness. There is no rebound and no guarding.  Musculoskeletal: Normal range of motion.   Neurological: He is alert and oriented to person, place, and time.  Skin: Skin is warm and dry.  Vitals reviewed.   ED Course  Procedures (including critical care time) Labs Review Labs Reviewed  CBC - Abnormal; Notable for the following:    WBC 12.1 (*)    All other components within normal limits  BASIC METABOLIC PANEL - Abnormal; Notable for the following:    Sodium 133 (*)    Chloride 94 (*)    Glucose, Bld 109 (*)    All other components within normal limits  PRO B NATRIURETIC PEPTIDE - Abnormal; Notable for the following:    Pro B Natriuretic peptide (BNP) 1325.0 (*)    All other components within normal limits  URINALYSIS, ROUTINE W REFLEX MICROSCOPIC - Abnormal; Notable for the following:    Hgb urine dipstick MODERATE (*)    All other components within normal limits  COMPREHENSIVE METABOLIC PANEL - Abnormal; Notable for the following:    Sodium 131 (*)    Chloride 89 (*)    Glucose, Bld 138 (*)    All other components within normal limits  CBC WITH DIFFERENTIAL - Abnormal; Notable for the following:    WBC 12.1 (*)    Neutro Abs 9.2 (*)    All other components within normal limits  LIPID PANEL - Abnormal; Notable for the following:    Cholesterol 228 (*)    Triglycerides 338 (*)    HDL 30 (*)    VLDL 68 (*)    LDL Cholesterol 130 (*)    All other components within normal limits  LIPASE, BLOOD  URINE MICROSCOPIC-ADD ON  TROPONIN I  TROPONIN I  TROPONIN I  I-STAT TROPOININ, ED  I-STAT TROPOININ, ED  I-STAT TROPOININ, ED    Imaging Review US Abdomen Complete  07/19/2014   CLINICAL DATA:  Abdominal and chest pain for 1 day; history of hepatomegaly and cholecystectomy  EXAM: ULTRASOUND ABDOMEN COMPLETE  COMPARISON:  CT scan of the abdomen pelvis of February 13, 2014  FINDINGS: Gallbladder: The gallbladder is surgically absent.  Common bile duct: Diameter: 3.6 mm  Liver: The hepatic echotexture is increased diffusely. There is no focal mass nor ductal dilation.  The liver does not appear significantly increased in size.  IVC: No abnormality visualized.  Pancreas: Visualized portion unremarkable.  Spleen: Size and appearance within normal limits.  Right Kidney: Length: 13.4 cm. Echogenicity within normal limits. No mass or hydronephrosis visualized.  Left Kidney: Length: 13 cm. Echogenicity within normal limits. No mass or hydronephrosis visualized.  Abdominal aorta: The abdominal aorta was obscured in its mid and distal portions. Proximally the caliber is normal at 2.3 mm.  Other findings: No ascites is demonstrated.  IMPRESSION: No acute intra-abdominal abnormality is demonstrated. Increased hepatic echotexture is consistent with fatty infiltration.   Electronically Signed   By: David  Martinique  On: 07/19/2014 08:45   Dg Chest Portable 1 View  07/18/2014   CLINICAL DATA:  Chest pain.  Initial encounter.  EXAM: PORTABLE CHEST - 1 VIEW  COMPARISON:  07/28/2012 and prior chest radiographs  FINDINGS: The cardiomediastinal silhouette is unremarkable.  There is no evidence of focal airspace disease, pulmonary edema, suspicious pulmonary nodule/mass, pleural effusion, or pneumothorax. No acute bony abnormalities are identified.  IMPRESSION: No active disease.   Electronically Signed   By: Hassan Rowan M.D.   On: 07/18/2014 22:58     EKG Interpretation   Date/Time:  Monday July 19 2014 01:07:50 EST Ventricular Rate:  83 PR Interval:    QRS Duration: 140 QT Interval:  415 QTC Calculation: 488 R Axis:   -63 Text Interpretation:  Atrial fibrillation Ventricular premature complex  Nonspecific IVCD with LAD No significant change since last tracing  Confirmed by Debby Freiberg [phone]) on 07/19/2014 11:44:55 AM      MDM   Final diagnoses:  Hepatomegaly    53 y.o. male with pertinent PMH of PAF, cholelithiasis presents with acute onset afib and chest and abd pain.  Pain described as burning epigastric.  ECG on arrival with nonspecific changes.  Obtained  posterior ECG given ? STE in avR, however this was unremarkable.  Pt given dilaudid and GI cocktail with total relief of symptoms.  Deferred nitro given possibility of inferior infarct.  ASA given.  Consulted hospitalist for admission.    1. Hepatomegaly         Debby Freiberg, MD 07/19/14 1146

## 2014-07-18 NOTE — ED Notes (Signed)
Pt presents with c/o chest pain that started earlier today that is in the center of his chest. Pt reports the pain feels sharp in nature and is also pain in the upper part of his abdominal area. Pt has a hx of afib, currently in that rhythm at this time. Pt reports he does not take any medication for his afib.

## 2014-07-18 NOTE — ED Notes (Signed)
MD at bedside. 

## 2014-07-19 ENCOUNTER — Encounter (HOSPITAL_COMMUNITY): Payer: Self-pay | Admitting: Internal Medicine

## 2014-07-19 ENCOUNTER — Observation Stay (HOSPITAL_COMMUNITY): Payer: PRIVATE HEALTH INSURANCE

## 2014-07-19 DIAGNOSIS — I517 Cardiomegaly: Secondary | ICD-10-CM

## 2014-07-19 DIAGNOSIS — I5032 Chronic diastolic (congestive) heart failure: Secondary | ICD-10-CM

## 2014-07-19 DIAGNOSIS — K760 Fatty (change of) liver, not elsewhere classified: Secondary | ICD-10-CM

## 2014-07-19 DIAGNOSIS — I10 Essential (primary) hypertension: Secondary | ICD-10-CM

## 2014-07-19 DIAGNOSIS — R1013 Epigastric pain: Secondary | ICD-10-CM

## 2014-07-19 DIAGNOSIS — R079 Chest pain, unspecified: Secondary | ICD-10-CM

## 2014-07-19 DIAGNOSIS — I480 Paroxysmal atrial fibrillation: Principal | ICD-10-CM

## 2014-07-19 DIAGNOSIS — D72829 Elevated white blood cell count, unspecified: Secondary | ICD-10-CM | POA: Diagnosis present

## 2014-07-19 DIAGNOSIS — E871 Hypo-osmolality and hyponatremia: Secondary | ICD-10-CM | POA: Diagnosis present

## 2014-07-19 LAB — CBC WITH DIFFERENTIAL/PLATELET
Basophils Absolute: 0 10*3/uL (ref 0.0–0.1)
Basophils Relative: 0 % (ref 0–1)
Eosinophils Absolute: 0.1 10*3/uL (ref 0.0–0.7)
Eosinophils Relative: 1 % (ref 0–5)
HEMATOCRIT: 42.4 % (ref 39.0–52.0)
HEMOGLOBIN: 14.4 g/dL (ref 13.0–17.0)
Lymphocytes Relative: 16 % (ref 12–46)
Lymphs Abs: 1.9 10*3/uL (ref 0.7–4.0)
MCH: 30.9 pg (ref 26.0–34.0)
MCHC: 34 g/dL (ref 30.0–36.0)
MCV: 91 fL (ref 78.0–100.0)
MONO ABS: 0.8 10*3/uL (ref 0.1–1.0)
MONOS PCT: 7 % (ref 3–12)
NEUTROS ABS: 9.2 10*3/uL — AB (ref 1.7–7.7)
Neutrophils Relative %: 76 % (ref 43–77)
Platelets: 239 10*3/uL (ref 150–400)
RBC: 4.66 MIL/uL (ref 4.22–5.81)
RDW: 13.3 % (ref 11.5–15.5)
WBC: 12.1 10*3/uL — ABNORMAL HIGH (ref 4.0–10.5)

## 2014-07-19 LAB — COMPREHENSIVE METABOLIC PANEL
ALK PHOS: 104 U/L (ref 39–117)
ALT: 43 U/L (ref 0–53)
ANION GAP: 14 (ref 5–15)
AST: 33 U/L (ref 0–37)
Albumin: 4.1 g/dL (ref 3.5–5.2)
BILIRUBIN TOTAL: 0.7 mg/dL (ref 0.3–1.2)
BUN: 8 mg/dL (ref 6–23)
CHLORIDE: 89 meq/L — AB (ref 96–112)
CO2: 28 mEq/L (ref 19–32)
Calcium: 9.2 mg/dL (ref 8.4–10.5)
Creatinine, Ser: 0.78 mg/dL (ref 0.50–1.35)
GFR calc Af Amer: >90 mL/min (ref >90)
GFR calc non Af Amer: >90 mL/min (ref >90)
Glucose, Bld: 138 mg/dL — ABNORMAL HIGH (ref 70–99)
POTASSIUM: 3.8 meq/L (ref 3.7–5.3)
SODIUM: 131 meq/L — AB (ref 137–147)
TOTAL PROTEIN: 7.7 g/dL (ref 6.0–8.3)

## 2014-07-19 LAB — URINALYSIS, ROUTINE W REFLEX MICROSCOPIC
Bilirubin Urine: NEGATIVE
Glucose, UA: NEGATIVE mg/dL
Ketones, ur: NEGATIVE mg/dL
LEUKOCYTES UA: NEGATIVE
Nitrite: NEGATIVE
PROTEIN: NEGATIVE mg/dL
Specific Gravity, Urine: 1.014 (ref 1.005–1.030)
UROBILINOGEN UA: 0.2 mg/dL (ref 0.0–1.0)
pH: 6 (ref 5.0–8.0)

## 2014-07-19 LAB — LIPID PANEL
Cholesterol: 228 mg/dL — ABNORMAL HIGH (ref 0–200)
HDL: 30 mg/dL — AB (ref >39)
LDL Cholesterol: 130 mg/dL — ABNORMAL HIGH (ref 0–99)
Total CHOL/HDL Ratio: 7.6 RATIO
Triglycerides: 338 mg/dL — ABNORMAL HIGH (ref <150)
VLDL: 68 mg/dL — AB (ref 0–40)

## 2014-07-19 LAB — TROPONIN I
Troponin I: <0.3 ng/mL (ref <0.30)
Troponin I: <0.3 ng/mL (ref <0.30)

## 2014-07-19 LAB — URINE MICROSCOPIC-ADD ON

## 2014-07-19 LAB — I-STAT TROPONIN, ED: TROPONIN I, POC: 0 ng/mL (ref 0.00–0.08)

## 2014-07-19 MED ORDER — ASPIRIN 325 MG PO TABS
325.0000 mg | ORAL_TABLET | Freq: Once | ORAL | Status: AC
  Administered 2014-07-19: 325 mg via ORAL
  Filled 2014-07-19: qty 1

## 2014-07-19 MED ORDER — GUAIFENESIN 200 MG PO TABS
200.0000 mg | ORAL_TABLET | Freq: Two times a day (BID) | ORAL | Status: DC | PRN
  Filled 2014-07-19: qty 1

## 2014-07-19 MED ORDER — APIXABAN 5 MG PO TABS
5.0000 mg | ORAL_TABLET | Freq: Two times a day (BID) | ORAL | Status: DC
  Administered 2014-07-19 – 2014-07-23 (×8): 5 mg via ORAL
  Filled 2014-07-19 (×9): qty 1

## 2014-07-19 MED ORDER — CHOLESTYRAMINE 4 G PO PACK
4.0000 g | PACK | Freq: Once | ORAL | Status: AC
  Administered 2014-07-19: 4 g via ORAL
  Filled 2014-07-19: qty 1

## 2014-07-19 MED ORDER — CHOLESTYRAMINE 4 G PO PACK
4.0000 g | PACK | ORAL | Status: DC
  Administered 2014-07-20 – 2014-07-22 (×3): 4 g via ORAL
  Filled 2014-07-19 (×6): qty 1

## 2014-07-19 MED ORDER — ACETAMINOPHEN 325 MG PO TABS: 650.0000 mg | ORAL_TABLET | ORAL | Status: DC | PRN

## 2014-07-19 MED ORDER — METOPROLOL SUCCINATE ER 50 MG PO TB24
50.0000 mg | ORAL_TABLET | Freq: Every day | ORAL | Status: DC
  Administered 2014-07-19 – 2014-07-23 (×5): 50 mg via ORAL
  Filled 2014-07-19 (×5): qty 1

## 2014-07-19 MED ORDER — PANTOPRAZOLE SODIUM 40 MG PO TBEC
40.0000 mg | DELAYED_RELEASE_TABLET | Freq: Every day | ORAL | Status: DC
Start: 2014-07-19 — End: 2014-07-23
  Administered 2014-07-19 – 2014-07-23 (×5): 40 mg via ORAL
  Filled 2014-07-19 (×6): qty 1

## 2014-07-19 MED ORDER — LOSARTAN POTASSIUM 50 MG PO TABS
50.0000 mg | ORAL_TABLET | Freq: Every day | ORAL | Status: DC
Start: 2014-07-19 — End: 2014-07-23
  Administered 2014-07-19 – 2014-07-23 (×5): 50 mg via ORAL
  Filled 2014-07-19 (×7): qty 1

## 2014-07-19 MED ORDER — ALBUTEROL SULFATE (2.5 MG/3ML) 0.083% IN NEBU: 2.5000 mg | INHALATION_SOLUTION | Freq: Four times a day (QID) | RESPIRATORY_TRACT | Status: DC | PRN

## 2014-07-19 MED ORDER — ENOXAPARIN SODIUM 40 MG/0.4ML ~~LOC~~ SOLN
40.0000 mg | SUBCUTANEOUS | Status: DC
  Administered 2014-07-19: 40 mg via SUBCUTANEOUS
  Filled 2014-07-19: qty 0.4

## 2014-07-19 MED ORDER — MORPHINE SULFATE 2 MG/ML IJ SOLN: 2.0000 mg | INTRAMUSCULAR | Status: DC | PRN

## 2014-07-19 MED ORDER — ONDANSETRON HCL 4 MG/2ML IJ SOLN: 4.0000 mg | Freq: Four times a day (QID) | INTRAMUSCULAR | Status: DC | PRN

## 2014-07-19 MED ORDER — CLORAZEPATE DIPOTASSIUM 3.75 MG PO TABS
7.5000 mg | ORAL_TABLET | Freq: Every day | ORAL | Status: DC
  Administered 2014-07-19 – 2014-07-22 (×4): 7.5 mg via ORAL
  Filled 2014-07-19 (×3): qty 2
  Filled 2014-07-19: qty 1

## 2014-07-19 MED ORDER — CITALOPRAM HYDROBROMIDE 10 MG PO TABS
10.0000 mg | ORAL_TABLET | Freq: Every day | ORAL | Status: DC
  Administered 2014-07-19 – 2014-07-23 (×5): 10 mg via ORAL
  Filled 2014-07-19 (×6): qty 1

## 2014-07-19 MED ORDER — SIMVASTATIN 20 MG PO TABS
20.0000 mg | ORAL_TABLET | Freq: Every day | ORAL | Status: DC
  Administered 2014-07-19 – 2014-07-22 (×4): 20 mg via ORAL
  Filled 2014-07-19 (×5): qty 1

## 2014-07-19 NOTE — Progress Notes (Signed)
Pt brought CPAP machine in from home.  RT inspected for frayed cords/exposed wires, none were noted.  Machine seems to be in proper working order.  RT to monitor and assess as needed. RN to notify [facility] for inspection.

## 2014-07-19 NOTE — Progress Notes (Addendum)
[facility] PROGRESS NOTE  Harold Scott ZOX:[id number] DOB: 10/11/1960 DOA: 07/18/2014 PCP: [name], MD  Assessment/Plan: 1-A fib, paroxysmal; Patient presents with Chest pain, epigastric pain, palpitation.  Will consult cardiology. Appreciate cardiology help. Started on Eliquis.  Continue with metoprolol.   2-Chest pain, epigastric pain. Abdominal US negative for gallstone, only fatty liver. No splenomegaly.  Continue with protonix. Cardiology consulted.   3-Hyperlipidemia;  Start simvastatin.   4-HTN; continue with Cozaar.  5-Leukocytosis; needs to follow up with PCP. Might need referral to hematologist. No splenomegaly on [place].   Code Status: Full Code.  Family Communication: care discussed with wife who was at bedside.  Disposition Plan: to be determine.    Consultants:  Cardiology  Procedures:  ECHO pending.   Antibiotics:  none  HPI/Subjective: He is epigastric, chest pain free.  Yesterday he develop chest , epigastric pain, burning sensation. At same time he felt his heart irregular rhythm.    Objective: Filed Vitals:   07/19/14 1505  BP: 146/99  Pulse: 78  Temp: 98.5 F (36.9 C)  Resp: 18    Intake/Output Summary (Last 24 hours) at 07/19/14 1617 Last data filed at 07/19/14 0500  Gross per 24 hour  Intake      0 ml  Output    450 ml  Net   -450 ml   Filed Weights   07/18/14 2150 07/19/14 0320  Weight: 127.007 kg (280 lb) 127.098 kg (280 lb 3.2 oz)    Exam:   General:  Alert in no distress.   Cardiovascular: S 1, S 2 IRR  Respiratory: CTA  Abdomen: BS present, soft, NT  Musculoskeletal: no edema.   Data Reviewed: Basic Metabolic Panel:  Recent Labs Lab 07/18/14 2156 07/19/14 0425  NA 133* 131*  K 4.0 3.8  CL 94* 89*  CO2 25 28  GLUCOSE 109* 138*  BUN 8 8  CREATININE 0.72 0.78  CALCIUM 9.3 9.2   Liver Function Tests:  Recent Labs Lab 07/19/14 0425  AST 33  ALT 43  ALKPHOS 104  BILITOT 0.7   PROT 7.7  ALBUMIN 4.1    Recent Labs Lab 07/18/14 2156  LIPASE 30   No results for input(s): AMMONIA in the last 168 hours. CBC:  Recent Labs Lab 07/18/14 2156 07/19/14 0425  WBC 12.1* 12.1*  NEUTROABS  --  9.2*  HGB 14.5 14.4  HCT 42.6 42.4  MCV 90.3 91.0  PLT 250 239   Cardiac Enzymes:  Recent Labs Lab 07/19/14 0425 07/19/14 1015  TROPONINI <0.30 <0.30   BNP (last 3 results)  Recent Labs  07/18/14 2156  PROBNP 1325.0*   CBG: No results for input(s): GLUCAP in the last 168 hours.  No results found for this or any previous visit (from the past 240 hour(s)).   Studies: US Abdomen Complete  07/19/2014   CLINICAL DATA:  Abdominal and chest pain for 1 day; history of hepatomegaly and cholecystectomy  EXAM: ULTRASOUND ABDOMEN COMPLETE  COMPARISON:  CT scan of the abdomen pelvis of February 13, 2014  FINDINGS: Gallbladder: The gallbladder is surgically absent.  Common bile duct: Diameter: 3.6 mm  Liver: The hepatic echotexture is increased diffusely. There is no focal mass nor ductal dilation. The liver does not appear significantly increased in size.  IVC: No abnormality visualized.  Pancreas: Visualized portion unremarkable.  Spleen: Size and appearance within normal limits.  Right Kidney: Length: 13.4 cm. Echogenicity within normal limits. No mass or hydronephrosis visualized.  Left Kidney: Length: 13  cm. Echogenicity within normal limits. No mass or hydronephrosis visualized.  Abdominal aorta: The abdominal aorta was obscured in its mid and distal portions. Proximally the caliber is normal at 2.3 mm.  Other findings: No ascites is demonstrated.  IMPRESSION: No acute intra-abdominal abnormality is demonstrated. Increased hepatic echotexture is consistent with fatty infiltration.   Electronically Signed   By: David  Martinique   On: 07/19/2014 08:45   Dg Chest Portable 1 View  07/18/2014   CLINICAL DATA:  Chest pain.  Initial encounter.  EXAM: PORTABLE CHEST - 1 VIEW   COMPARISON:  07/28/2012 and prior chest radiographs  FINDINGS: The cardiomediastinal silhouette is unremarkable.  There is no evidence of focal airspace disease, pulmonary edema, suspicious pulmonary nodule/mass, pleural effusion, or pneumothorax. No acute bony abnormalities are identified.  IMPRESSION: No active disease.   Electronically Signed   By: Hassan Rowan M.D.   On: 07/18/2014 22:58    Scheduled Meds: . apixaban  5 mg Oral BID  . [START ON 07/20/2014] cholestyramine  4 g Oral Q24H  . citalopram  10 mg Oral Daily  . clorazepate  7.5 mg Oral QHS  . losartan  50 mg Oral Daily  . metoprolol succinate  50 mg Oral Daily  . pantoprazole  40 mg Oral Daily  . simvastatin  20 mg Oral q1800   Continuous Infusions:   Active Problems:   PAF (paroxysmal atrial fibrillation)   Essential hypertension   Obstructive sleep apnea   Diastolic CHF, chronic   Chest pain   Hyponatremia   Leukocytosis   Hepatic steatosis    Time spent: 35 minutes.     Niel Hummer A  [facility] Pager [phone]. If 7PM-7AM, please contact night-coverage at [email or web address], password [facility] 07/19/2014, 4:17 PM  LOS: 1 day

## 2014-07-19 NOTE — Consult Note (Addendum)
Primary cardiologist: Nishan/[name]  HPI: 53 yo male for evaluation of atrial fibrillation. Last echo 12/14 showed normal LV function, grade 2 diastolic dysfunction, moderate to severe LAE, mild MR. Patient has had paroxysmal atrial fibrillation noted previously. He apparently did not tolerate multaq or flecainide in the past. At last office visit in March 2015 [name] felt he may require ablation versus tikosyn. Patient typically does not have dyspnea on exertion, orthopnea, PND, pedal edema, syncope or exertional chest pain. He states he has bouts of atrial fibrillation on a weekly basis. These are noted based on increasing dyspnea on exertion and a "fluttering sensation" in his throat. They typically last 12-18 hours and resolve spontaneously. He developed recurrent symptoms at approximately 5 PM yesterday. He had associated epigastric burning and this concerned him because it was unusual. He presented and was noted to be in atrial fibrillation and was admitted. Audiology asked to evaluate.  Medications Prior to Admission  Medication Sig Dispense Refill  . albuterol (PROVENTIL HFA;VENTOLIN HFA) 108 (90 BASE) MCG/ACT inhaler Inhale 2 puffs into the lungs every 6 (six) hours as needed. Or shortness of breath    . cholestyramine (QUESTRAN) 4 G packet TAKE 2 PACKETS DAILY 60 packet 10  . citalopram (CELEXA) 10 MG tablet Take 1 tablet (10 mg total) by mouth daily. 90 tablet 2  . clorazepate (TRANXENE) 7.5 MG tablet Take 1 tablet (7.5 mg total) by mouth at bedtime. 30 tablet 5  . esomeprazole (NEXIUM) 40 MG capsule Take 40 mg by mouth daily at 12 noon.    Marland Kitchen guaiFENesin 200 MG tablet Take 200 mg by mouth 2 (two) times daily as needed (for cold).    Marland Kitchen ibuprofen (ADVIL,MOTRIN) 200 MG tablet Take 400 mg by mouth every 6 (six) hours as needed. For pain    . losartan (COZAAR) 50 MG tablet Take 1 tablet (50 mg total) by mouth daily. 90 tablet 3  . metoprolol succinate (TOPROL-XL) 50 MG 24 hr tablet TAKE  1 TABLET EVERY DAY 90 tablet 3    Allergies  Allergen Reactions  . Flecainide Other (See Comments)    EXTREME SOB  . Multaq [Dronedarone] Shortness Of Breath  . Penicillins Hives    Past Medical History  Diagnosis Date  . Anxiety   . Depression   . GERD (gastroesophageal reflux disease)   . Gallstones   . Asthma   . PONV (postoperative nausea and vomiting)   . Paroxysmal atrial fibrillation   . Obstructive sleep apnea     not compliant with CPAP    Past Surgical History  Procedure Laterality Date  . Hand surgery    . Tonsillectomy    . Wisdom tooth extraction    . Cholecystectomy  08/05/2012    Procedure: LAPAROSCOPIC CHOLECYSTECTOMY WITH INTRAOPERATIVE CHOLANGIOGRAM;  Surgeon: [name], MD,FACS;  Location: [facility];  Service: General;  Laterality: N/A;  . Laparoscopic appendectomy N/A 02/13/2014    Procedure: APPENDECTOMY LAPAROSCOPIC;  Surgeon: [name], MD;  Location: [facility];  Service: General;  Laterality: N/A;    History   Social History  . Marital Status: Married    Spouse Name: N/A    Number of Children: 1  . Years of Education: N/A   Occupational History  . Courier    Social History Main Topics  . Smoking status: Former Smoker -- 0.50 packs/day for 30 years    Types: Cigarettes    Quit date: 07/23/2006  . Smokeless tobacco: Never Used  .  Alcohol Use: Yes     Comment: social  . Drug Use: No  . Sexual Activity: Not on file   Other Topics Concern  . Not on file   Social History Narrative   Pt works as a Secondary school teacher    Family History  Problem Relation Age of Onset  . Adopted: Yes    ROS:  no fevers or chills, productive cough, hemoptysis, dysphasia, odynophagia, melena, hematochezia, dysuria, hematuria, rash, seizure activity, orthopnea, PND, pedal edema, claudication. Remaining systems are negative.  Physical Exam:   Blood pressure 140/96, pulse 88, temperature 97.7 F (36.5 C), temperature source Oral, resp. rate 16, height '5\' 7"'   (1.702 m), weight 280 lb 3.2 oz (127.098 kg), SpO2 98 %.  General:  Well developed/well nourished in NAD Skin warm/dry, tattoos Patient not depressed No peripheral clubbing Back-normal HEENT-normal/normal eyelids Neck supple/normal carotid upstroke bilaterally; no bruits; no JVD; no thyromegaly chest - CTA/ normal expansion CV - irregular/normal S1 and S2; no murmurs, rubs or gallops;  PMI nondisplaced Abdomen -NT/ND, no HSM, no mass, + bowel sounds, no bruit 2+ femoral pulses, no bruits Ext-no edema, chords, 2+ DP Neuro-grossly nonfocal  ECG Atrial fibrillation with no ST changes.  Results for orders placed or performed during the hospital encounter of 07/18/14 (from the past 48 hour(s))  CBC     Status: Abnormal   Collection Time: 07/18/14  9:56 PM  Result Value Ref Range   WBC 12.1 (H) 4.0 - 10.5 K/uL   RBC 4.72 4.22 - 5.81 MIL/uL   Hemoglobin 14.5 13.0 - 17.0 g/dL   HCT 42.6 39.0 - 52.0 %   MCV 90.3 78.0 - 100.0 fL   MCH 30.7 26.0 - 34.0 pg   MCHC 34.0 30.0 - 36.0 g/dL   RDW 13.1 11.5 - 15.5 %   Platelets 250 150 - 400 K/uL  Basic metabolic panel     Status: Abnormal   Collection Time: 07/18/14  9:56 PM  Result Value Ref Range   Sodium 133 (L) 137 - 147 mEq/L   Potassium 4.0 3.7 - 5.3 mEq/L   Chloride 94 (L) 96 - 112 mEq/L   CO2 25 19 - 32 mEq/L   Glucose, Bld 109 (H) 70 - 99 mg/dL   BUN 8 6 - 23 mg/dL   Creatinine, Ser 0.72 0.50 - 1.35 mg/dL   Calcium 9.3 8.4 - 10.5 mg/dL   GFR calc non Af Amer >90 >90 mL/min   GFR calc Af Amer >90 >90 mL/min    Comment: (NOTE) The eGFR has been calculated using the CKD EPI equation. This calculation has not been validated in all clinical situations. eGFR's persistently <90 mL/min signify possible Chronic Kidney Disease.    Anion gap 14 5 - 15  Pro b natriuretic peptide (order if patient c/o SOB ONLY)     Status: Abnormal   Collection Time: 07/18/14  9:56 PM  Result Value Ref Range   Pro B Natriuretic peptide (BNP) 1325.0  (H) 0 - 125 pg/mL  Lipase, blood     Status: None   Collection Time: 07/18/14  9:56 PM  Result Value Ref Range   Lipase 30 11 - 59 U/L  I-stat troponin, ED (not at [facility])     Status: None   Collection Time: 07/18/14 10:04 PM  Result Value Ref Range   Troponin i, poc 0.02 0.00 - 0.08 ng/mL   Comment 3            Comment: Due to the  release kinetics of cTnI, a negative result within the first hours of the onset of symptoms does not rule out myocardial infarction with certainty. If myocardial infarction is still suspected, repeat the test at appropriate intervals.   Urinalysis, Routine w reflex microscopic     Status: Abnormal   Collection Time: 07/19/14  2:10 AM  Result Value Ref Range   Color, Urine YELLOW YELLOW   APPearance CLEAR CLEAR   Specific Gravity, Urine 1.014 1.005 - 1.030   pH 6.0 5.0 - 8.0   Glucose, UA NEGATIVE NEGATIVE mg/dL   Hgb urine dipstick MODERATE (A) NEGATIVE   Bilirubin Urine NEGATIVE NEGATIVE   Ketones, ur NEGATIVE NEGATIVE mg/dL   Protein, ur NEGATIVE NEGATIVE mg/dL   Urobilinogen, UA 0.2 0.0 - 1.0 mg/dL   Nitrite NEGATIVE NEGATIVE   Leukocytes, UA NEGATIVE NEGATIVE  Urine microscopic-add on     Status: None   Collection Time: 07/19/14  2:10 AM  Result Value Ref Range   RBC / HPF 0-2 <3 RBC/hpf  I-stat troponin, ED (0, 3, 6) - do not order at [facility]     Status: None   Collection Time: 07/19/14  2:22 AM  Result Value Ref Range   Troponin i, poc 0.00 0.00 - 0.08 ng/mL   Comment 3            Comment: Due to the release kinetics of cTnI, a negative result within the first hours of the onset of symptoms does not rule out myocardial infarction with certainty. If myocardial infarction is still suspected, repeat the test at appropriate intervals.   Comprehensive metabolic panel     Status: Abnormal   Collection Time: 07/19/14  4:25 AM  Result Value Ref Range   Sodium 131 (L) 137 - 147 mEq/L   Potassium 3.8 3.7 - 5.3 mEq/L   Chloride 89 (L) 96 - 112  mEq/L   CO2 28 19 - 32 mEq/L   Glucose, Bld 138 (H) 70 - 99 mg/dL   BUN 8 6 - 23 mg/dL   Creatinine, Ser 0.78 0.50 - 1.35 mg/dL   Calcium 9.2 8.4 - 10.5 mg/dL   Total Protein 7.7 6.0 - 8.3 g/dL   Albumin 4.1 3.5 - 5.2 g/dL   AST 33 0 - 37 U/L   ALT 43 0 - 53 U/L   Alkaline Phosphatase 104 39 - 117 U/L   Total Bilirubin 0.7 0.3 - 1.2 mg/dL   GFR calc non Af Amer >90 >90 mL/min   GFR calc Af Amer >90 >90 mL/min    Comment: (NOTE) The eGFR has been calculated using the CKD EPI equation. This calculation has not been validated in all clinical situations. eGFR's persistently <90 mL/min signify possible Chronic Kidney Disease.    Anion gap 14 5 - 15  CBC with Differential     Status: Abnormal   Collection Time: 07/19/14  4:25 AM  Result Value Ref Range   WBC 12.1 (H) 4.0 - 10.5 K/uL   RBC 4.66 4.22 - 5.81 MIL/uL   Hemoglobin 14.4 13.0 - 17.0 g/dL   HCT 42.4 39.0 - 52.0 %   MCV 91.0 78.0 - 100.0 fL   MCH 30.9 26.0 - 34.0 pg   MCHC 34.0 30.0 - 36.0 g/dL   RDW 13.3 11.5 - 15.5 %   Platelets 239 150 - 400 K/uL   Neutrophils Relative % 76 43 - 77 %   Neutro Abs 9.2 (H) 1.7 - 7.7 K/uL   Lymphocytes Relative 16  12 - 46 %   Lymphs Abs 1.9 0.7 - 4.0 K/uL   Monocytes Relative 7 3 - 12 %   Monocytes Absolute 0.8 0.1 - 1.0 K/uL   Eosinophils Relative 1 0 - 5 %   Eosinophils Absolute 0.1 0.0 - 0.7 K/uL   Basophils Relative 0 0 - 1 %   Basophils Absolute 0.0 0.0 - 0.1 K/uL  Troponin I (q 6hr x 3)     Status: None   Collection Time: 07/19/14  4:25 AM  Result Value Ref Range   Troponin I <0.30 <0.30 ng/mL    Comment:        Due to the release kinetics of cTnI, a negative result within the first hours of the onset of symptoms does not rule out myocardial infarction with certainty. If myocardial infarction is still suspected, repeat the test at appropriate intervals.   Lipid panel     Status: Abnormal   Collection Time: 07/19/14  4:25 AM  Result Value Ref Range   Cholesterol 228 (H)  0 - 200 mg/dL   Triglycerides 338 (H) <150 mg/dL   HDL 30 (L) >39 mg/dL   Total CHOL/HDL Ratio 7.6 RATIO   VLDL 68 (H) 0 - 40 mg/dL   LDL Cholesterol 130 (H) 0 - 99 mg/dL    Comment:        Total Cholesterol/HDL:CHD Risk Coronary Heart Disease Risk Table                     Men   Women  1/2 Average Risk   3.4   3.3  Average Risk       5.0   4.4  2 X Average Risk   9.6   7.1  3 X Average Risk  23.4   11.0        Use the calculated Patient Ratio above and the CHD Risk Table to determine the patient's CHD Risk.        ATP III CLASSIFICATION (LDL):  <100     mg/dL   Optimal  100-129  mg/dL   Near or Above                    Optimal  130-159  mg/dL   Borderline  160-189  mg/dL   High  >190     mg/dL   Very High Performed at [facility]   Troponin I (q 6hr x 3)     Status: None   Collection Time: 07/19/14 10:15 AM  Result Value Ref Range   Troponin I <0.30 <0.30 ng/mL    Comment:        Due to the release kinetics of cTnI, a negative result within the first hours of the onset of symptoms does not rule out myocardial infarction with certainty. If myocardial infarction is still suspected, repeat the test at appropriate intervals.     US Abdomen Complete  07/19/2014   CLINICAL DATA:  Abdominal and chest pain for 1 day; history of hepatomegaly and cholecystectomy  EXAM: ULTRASOUND ABDOMEN COMPLETE  COMPARISON:  CT scan of the abdomen pelvis of February 13, 2014  FINDINGS: Gallbladder: The gallbladder is surgically absent.  Common bile duct: Diameter: 3.6 mm  Liver: The hepatic echotexture is increased diffusely. There is no focal mass nor ductal dilation. The liver does not appear significantly increased in size.  IVC: No abnormality visualized.  Pancreas: Visualized portion unremarkable.  Spleen: Size and appearance within normal limits.  Right Kidney: Length: 13.4 cm. Echogenicity within normal limits. No mass or hydronephrosis visualized.  Left Kidney: Length: 13 cm.  Echogenicity within normal limits. No mass or hydronephrosis visualized.  Abdominal aorta: The abdominal aorta was obscured in its mid and distal portions. Proximally the caliber is normal at 2.3 mm.  Other findings: No ascites is demonstrated.  IMPRESSION: No acute intra-abdominal abnormality is demonstrated. Increased hepatic echotexture is consistent with fatty infiltration.   Electronically Signed   By: David  Martinique   On: 07/19/2014 08:45   Dg Chest Portable 1 View  07/18/2014   CLINICAL DATA:  Chest pain.  Initial encounter.  EXAM: PORTABLE CHEST - 1 VIEW  COMPARISON:  07/28/2012 and prior chest radiographs  FINDINGS: The cardiomediastinal silhouette is unremarkable.  There is no evidence of focal airspace disease, pulmonary edema, suspicious pulmonary nodule/mass, pleural effusion, or pneumothorax. No acute bony abnormalities are identified.  IMPRESSION: No active disease.   Electronically Signed   By: Hassan Rowan M.D.   On: 07/18/2014 22:58    Assessment/Plan 1 paroxysmal atrial fibrillation-the patient is having symptomatic atrial fibrillation on a weekly basis. His rate is well controlled on telemetry. Continue metoprolol. His Chads-vasc score is one for hypertension. Add apixaban 5 mg po BID. He has been intolerant to multaq and flecanide in past. The duration of his atrial fibrillation is unknown. He first noticed symptoms at 5 PM yesterday but is not completely clear about time of onset. At time of this evaluation he did not realize he was in atrial fibrillation despite this being documented on telemetry. We will plan transesophageal echocardiogram on December 2 if patient does not convert spontaneously. If no left atrial appendage thrombus identified then tikosyn will be initiated to help maintain sinus rhythm. Check magnesium and potassium tomorrow morning. Await follow-up echocardiogram. Repeat TSH. I will ask [name] to review tomorrow morning concerning antiarrhythmic versus proceeding with  ablation now. Note patient will remain at [facility] tomorrow following nuclear study. 2 epigastric pain-symptoms are atypical. Enzymes are negative. We will plan to proceed with a nuclear study tomorrow. Risk stratification. 3 hypertension-continue present medications and increase as needed.   Kirk Ruths MD 07/19/2014, 2:06 PM

## 2014-07-19 NOTE — Progress Notes (Signed)
UR completed 

## 2014-07-19 NOTE — Plan of Care (Signed)
Problem: Phase I Progression Outcomes Goal: Pain controlled with appropriate interventions Outcome: Completed/Met Date Met:  07/19/14 Goal: OOB as tolerated unless otherwise ordered Outcome: Completed/Met Date Met:  07/19/14 Goal: Voiding-avoid urinary catheter unless indicated Outcome: Completed/Met Date Met:  07/19/14 Goal: Hemodynamically stable Outcome: Completed/Met Date Met:  07/19/14

## 2014-07-19 NOTE — Progress Notes (Signed)
Echocardiogram 2D Echocardiogram has been performed.  Harold Scott 07/19/2014, 1:10 PM

## 2014-07-19 NOTE — H&P (Addendum)
PCP:  [name], MD  Cardiology [name]  Chief Complaint:  Chest discomfort  HPI: Harold Scott is a 53 y.o. male   has a past medical history of Anxiety; Depression; GERD (gastroesophageal reflux disease); Gallstones; Asthma; PONV (postoperative nausea and vomiting); Paroxysmal atrial fibrillation; and Obstructive sleep apnea.   Presented with  At 5 pm he noted that he converted to a.fib and started to have RUQ/epigastric and substernal burning sensation. He took some Maylox with no relief. He layed down and it helped. Patietn took a nap but woke up with worse pain. On Arrival to ER he was given GI coctail that helped right away. He was also given dilaudid with good results. Trop negative, ECG with out acute changes. Currently chest pain free. Patient has hx of OSA on CPAP Of note patient was admitted last June with findings of acute appendicitis at that time he underwent a laparoscopic appendectomy. CT scan of abdomen at the time showed hepatic steatosis  Hospitalist was called for admission for atypical Chest pain with risk factors  Review of Systems:    Pertinent positives include:   chest pain, epigastric/ RUQ pain  Constitutional:  No weight loss, night sweats, Fevers, chills, fatigue, weight loss  HEENT:  No headaches, Difficulty swallowing,Tooth/dental problems,Sore throat,  No sneezing, itching, ear ache, nasal congestion, post nasal drip,  Cardio-vascular:  No Orthopnea, PND, anasarca, dizziness, palpitations.no Bilateral lower extremity swelling  GI:  No heartburn, indigestion, abdominal pain, nausea, vomiting, diarrhea, change in bowel habits, loss of appetite, melena, blood in stool, hematemesis Resp:  no shortness of breath at rest. No dyspnea on exertion, No excess mucus, no productive cough, No non-productive cough, No coughing up of blood.No change in color of mucus.No wheezing. Skin:  no rash or lesions. No jaundice GU:  no dysuria,  change in color of urine, no urgency or frequency. No straining to urinate.  No flank pain.  Musculoskeletal:  No joint pain or no joint swelling. No decreased range of motion. No back pain.  Psych:  No change in mood or affect. No depression or anxiety. No memory loss.  Neuro: no localizing neurological complaints, no tingling, no weakness, no double vision, no gait abnormality, no slurred speech, no confusion  Otherwise ROS are negative except for above, 10 systems were reviewed  Past Medical History: Past Medical History  Diagnosis Date  . Anxiety   . Depression   . GERD (gastroesophageal reflux disease)   . Gallstones   . Asthma   . PONV (postoperative nausea and vomiting)   . Paroxysmal atrial fibrillation   . Obstructive sleep apnea     not compliant with CPAP   Past Surgical History  Procedure Laterality Date  . Hand surgery    . Tonsillectomy    . Wisdom tooth extraction    . Cholecystectomy  08/05/2012    Procedure: LAPAROSCOPIC CHOLECYSTECTOMY WITH INTRAOPERATIVE CHOLANGIOGRAM;  Surgeon: [name], MD,FACS;  Location: [facility];  Service: General;  Laterality: N/A;  . Laparoscopic appendectomy N/A 02/13/2014    Procedure: APPENDECTOMY LAPAROSCOPIC;  Surgeon: [name], MD;  Location: [facility];  Service: General;  Laterality: N/A;     Medications: Prior to Admission medications   Medication Sig Start Date End Date Taking? Authorizing Provider  albuterol (PROVENTIL HFA;VENTOLIN HFA) 108 (90 BASE) MCG/ACT inhaler Inhale 2 puffs into the lungs every 6 (six) hours as needed. Or shortness of breath 11/28/11  Yes Dorena Cookey, MD  cholestyramine (  QUESTRAN) 4 G packet TAKE 2 PACKETS DAILY 06/03/14  Yes Dorena Cookey, MD  citalopram (CELEXA) 10 MG tablet Take 1 tablet (10 mg total) by mouth daily. 12/28/13  Yes Dorena Cookey, MD  clorazepate (TRANXENE) 7.5 MG tablet Take 1 tablet (7.5 mg total) by mouth at bedtime. 07/09/14  Yes Dorena Cookey, MD  esomeprazole  (NEXIUM) 40 MG capsule Take 40 mg by mouth daily at 12 noon.   Yes Historical Provider, MD  guaiFENesin 200 MG tablet Take 200 mg by mouth 2 (two) times daily as needed (for cold).   Yes Historical Provider, MD  ibuprofen (ADVIL,MOTRIN) 200 MG tablet Take 400 mg by mouth every 6 (six) hours as needed. For pain   Yes Historical Provider, MD  losartan (COZAAR) 50 MG tablet Take 1 tablet (50 mg total) by mouth daily. 10/29/13  Yes [name], MD  metoprolol succinate (TOPROL-XL) 50 MG 24 hr tablet TAKE 1 TABLET EVERY DAY 07/06/14  Yes Dorena Cookey, MD    Allergies:   Allergies  Allergen Reactions  . Flecainide Other (See Comments)    EXTREME SOB  . Multaq [Dronedarone] Shortness Of Breath  . Penicillins Hives    Social History:  Ambulatory   independently   Lives at home  With family     reports that he quit smoking about 7 years ago. His smoking use included Cigarettes. He has a 15 pack-year smoking history. He has never used smokeless tobacco. He reports that he drinks alcohol. He reports that he does not use illicit drugs.    Family History: family history is not on file. He was adopted.    Physical Exam: Patient Vitals for the past 24 hrs:  BP Temp Temp src Pulse Resp SpO2 Height Weight  07/19/14 0000 142/75 mmHg - - 85 16 97 % - -  07/18/14 2330 126/80 mmHg - - 86 21 94 % - -  07/18/14 2300 131/88 mmHg - - 81 19 95 % - -  07/18/14 2230 134/82 mmHg - - 88 21 97 % - -  07/18/14 2150 (!) 155/110 mmHg 98.2 F (36.8 C) Oral 94 19 94 % 5\' 7"  (1.702 m) 127.007 kg (280 lb)    1. General:  in No Acute distress 2. Psychological: Alert and  Oriented 3. Head/ENT:   Moist  Mucous Membranes                          Head Non traumatic, neck supple                          Normal  Dentition 4. SKIN: normal Skin turgor,  Skin clean Dry and intact no rash 5. Heart: Regular rate and rhythm no Murmur, Rub or gallop 6. Lungs: Clear to auscultation bilaterally, no wheezes or crackles     7. Abdomen: Soft, non-tender, Non distended, obese(questionable spleenomegaly as well as hepatomegaly) 8. Lower extremities: no clubbing, cyanosis, or edema 9. Neurologically Grossly intact, moving all 4 extremities equally 10. MSK: Normal range of motion  body mass index is 43.84 kg/(m^2).   Labs on Admission:   Results for orders placed or performed during the hospital encounter of 07/18/14 (from the past 24 hour(s))  CBC     Status: Abnormal   Collection Time: 07/18/14  9:56 PM  Result Value Ref Range   WBC 12.1 (H) 4.0 - 10.5 K/uL   RBC 4.72  4.22 - 5.81 MIL/uL   Hemoglobin 14.5 13.0 - 17.0 g/dL   HCT 42.6 39.0 - 52.0 %   MCV 90.3 78.0 - 100.0 fL   MCH 30.7 26.0 - 34.0 pg   MCHC 34.0 30.0 - 36.0 g/dL   RDW 13.1 11.5 - 15.5 %   Platelets 250 150 - 400 K/uL  Basic metabolic panel     Status: Abnormal   Collection Time: 07/18/14  9:56 PM  Result Value Ref Range   Sodium 133 (L) 137 - 147 mEq/L   Potassium 4.0 3.7 - 5.3 mEq/L   Chloride 94 (L) 96 - 112 mEq/L   CO2 25 19 - 32 mEq/L   Glucose, Bld 109 (H) 70 - 99 mg/dL   BUN 8 6 - 23 mg/dL   Creatinine, Ser 0.72 0.50 - 1.35 mg/dL   Calcium 9.3 8.4 - 10.5 mg/dL   GFR calc non Af Amer >90 >90 mL/min   GFR calc Af Amer >90 >90 mL/min   Anion gap 14 5 - 15  Pro b natriuretic peptide (order if patient c/o SOB ONLY)     Status: Abnormal   Collection Time: 07/18/14  9:56 PM  Result Value Ref Range   Pro B Natriuretic peptide (BNP) 1325.0 (H) 0 - 125 pg/mL  Lipase, blood     Status: None   Collection Time: 07/18/14  9:56 PM  Result Value Ref Range   Lipase 30 11 - 59 U/L  I-stat troponin, ED (not at [facility])     Status: None   Collection Time: 07/18/14 10:04 PM  Result Value Ref Range   Troponin i, poc 0.02 0.00 - 0.08 ng/mL   Comment 3            UA not obtained  No results found for: HGBA1C  Estimated Creatinine Clearance: 138.3 mL/min (by C-G formula based on Cr of 0.72).  BNP (last 3 results)  Recent Labs   07/18/14 2156  PROBNP 1325.0*    Other results:  I have pearsonaly reviewed this: ECG REPORT  Rate: 83  Rhythm: a.fib ST&T Change: no ischemic changes   Filed Weights   07/18/14 2150  Weight: 127.007 kg (280 lb)    Cultures: No results found for: SDES, SPECREQUEST, CULT, REPTSTATUS   Radiological Exams on Admission: Dg Chest Portable 1 View  07/18/2014   CLINICAL DATA:  Chest pain.  Initial encounter.  EXAM: PORTABLE CHEST - 1 VIEW  COMPARISON:  07/28/2012 and prior chest radiographs  FINDINGS: The cardiomediastinal silhouette is unremarkable.  There is no evidence of focal airspace disease, pulmonary edema, suspicious pulmonary nodule/mass, pleural effusion, or pneumothorax. No acute bony abnormalities are identified.  IMPRESSION: No active disease.   Electronically Signed   By: Hassan Rowan M.D.   On: 07/18/2014 22:58    Chart has been reviewed  Assessment/Plan  53 year-old gentleman history of paroxysmal atrial fibrillation on aspirin, history of diastolic heart failure, hypertension presents with atypical chest pain  Present on Admission:  . Chest pain- - given risk factors will admit, monitor on telemetry, cycle cardiac enzymes, obtain serial ECG. Further risk stratify with lipid panel, hgA1C, obtain TSH. Make sure patient is on Aspirin. Further treatment based on the currently pending results. Most likely GI related, make sure he is on Protonix check LFTs and lipase within normal limits  . Diastolic CHF, chronic - currently appears to be euvolemic  . PAF (paroxysmal atrial fibrillation) - continue full dose aspirin and beta blocker currently  rate controlled  . Obstructive sleep apnea- continue CPAP . Essential hypertension - continue home medications including Cozaar and Toprol  . Hyponatremia - mild will recheck in the morning  . Leukocytosis - appears to be chronic since 2012. On physical exam mild splenomegaly noted will obtain abdominal ultrasound prior CT scan from June  showed hepatic steatosis  . Hepatic steatosis - chronic will check lipid panel patient reports only social drinking obtain liver panel   Prophylaxis: Lovenox, Protonix  CODE STATUS:  FULL CODE   Other plan as per orders.  I have spent a total of 55 min on this admission  Cayleb Jarnigan 07/19/2014, 1:54 AM  [facility]  Pager [phone]   after 2 AM please page floor coverage PA If 7AM-7PM, please contact the day team taking care of the patient  [email or web address]  Password [email or web address]

## 2014-07-20 ENCOUNTER — Ambulatory Visit (HOSPITAL_COMMUNITY)
Admit: 2014-07-20 | Discharge: 2014-07-20 | Disposition: A | Discharge status: Home / Self Care | Payer: PRIVATE HEALTH INSURANCE | Source: Home / Self Care | Attending: Cardiology | Admitting: Cardiology

## 2014-07-20 ENCOUNTER — Other Ambulatory Visit: Payer: Self-pay

## 2014-07-20 ENCOUNTER — Observation Stay (HOSPITAL_COMMUNITY)
Admission: EM | Admit: 2014-07-20 | Payer: PRIVATE HEALTH INSURANCE | Source: Home / Self Care | Admitting: Internal Medicine

## 2014-07-20 ENCOUNTER — Ambulatory Visit (HOSPITAL_COMMUNITY)
Admit: 2014-07-20 | Discharge: 2014-07-20 | Disposition: A | Discharge status: Home / Self Care | Payer: PRIVATE HEALTH INSURANCE | Attending: Cardiology | Admitting: Cardiology

## 2014-07-20 ENCOUNTER — Observation Stay (HOSPITAL_COMMUNITY)
Admission: EM | Admit: 2014-07-20 | Discharge: 2014-07-20 | Disposition: A | Discharge status: Home / Self Care | Payer: PRIVATE HEALTH INSURANCE | Source: Home / Self Care | Attending: Cardiology | Admitting: Cardiology

## 2014-07-20 ENCOUNTER — Encounter (HOSPITAL_COMMUNITY): Payer: Self-pay | Admitting: *Deleted

## 2014-07-20 DIAGNOSIS — Z888 Allergy status to other drugs, medicaments and biological substances status: Secondary | ICD-10-CM | POA: Diagnosis not present

## 2014-07-20 DIAGNOSIS — G4733 Obstructive sleep apnea (adult) (pediatric): Secondary | ICD-10-CM | POA: Diagnosis present

## 2014-07-20 DIAGNOSIS — Z9119 Patient's noncompliance with other medical treatment and regimen: Secondary | ICD-10-CM | POA: Diagnosis present

## 2014-07-20 DIAGNOSIS — R079 Chest pain, unspecified: Secondary | ICD-10-CM

## 2014-07-20 DIAGNOSIS — I4891 Unspecified atrial fibrillation: Secondary | ICD-10-CM

## 2014-07-20 DIAGNOSIS — Z7982 Long term (current) use of aspirin: Secondary | ICD-10-CM | POA: Diagnosis not present

## 2014-07-20 DIAGNOSIS — Z6841 Body Mass Index (BMI) 40.0 and over, adult: Secondary | ICD-10-CM | POA: Diagnosis not present

## 2014-07-20 DIAGNOSIS — K760 Fatty (change of) liver, not elsewhere classified: Secondary | ICD-10-CM | POA: Diagnosis present

## 2014-07-20 DIAGNOSIS — I10 Essential (primary) hypertension: Secondary | ICD-10-CM | POA: Diagnosis present

## 2014-07-20 DIAGNOSIS — F329 Major depressive disorder, single episode, unspecified: Secondary | ICD-10-CM | POA: Diagnosis present

## 2014-07-20 DIAGNOSIS — I5032 Chronic diastolic (congestive) heart failure: Secondary | ICD-10-CM | POA: Diagnosis present

## 2014-07-20 DIAGNOSIS — Z880 Allergy status to penicillin: Secondary | ICD-10-CM | POA: Diagnosis not present

## 2014-07-20 DIAGNOSIS — K219 Gastro-esophageal reflux disease without esophagitis: Secondary | ICD-10-CM | POA: Diagnosis present

## 2014-07-20 DIAGNOSIS — Z79899 Other long term (current) drug therapy: Secondary | ICD-10-CM | POA: Diagnosis not present

## 2014-07-20 DIAGNOSIS — D72829 Elevated white blood cell count, unspecified: Secondary | ICD-10-CM | POA: Diagnosis present

## 2014-07-20 DIAGNOSIS — J45909 Unspecified asthma, uncomplicated: Secondary | ICD-10-CM | POA: Diagnosis present

## 2014-07-20 DIAGNOSIS — Z87891 Personal history of nicotine dependence: Secondary | ICD-10-CM | POA: Diagnosis not present

## 2014-07-20 DIAGNOSIS — E669 Obesity, unspecified: Secondary | ICD-10-CM | POA: Diagnosis present

## 2014-07-20 DIAGNOSIS — I480 Paroxysmal atrial fibrillation: Secondary | ICD-10-CM | POA: Diagnosis present

## 2014-07-20 DIAGNOSIS — F419 Anxiety disorder, unspecified: Secondary | ICD-10-CM | POA: Diagnosis present

## 2014-07-20 DIAGNOSIS — R946 Abnormal results of thyroid function studies: Secondary | ICD-10-CM | POA: Diagnosis not present

## 2014-07-20 DIAGNOSIS — R0789 Other chest pain: Secondary | ICD-10-CM

## 2014-07-20 DIAGNOSIS — E871 Hypo-osmolality and hyponatremia: Secondary | ICD-10-CM | POA: Diagnosis present

## 2014-07-20 DIAGNOSIS — E785 Hyperlipidemia, unspecified: Secondary | ICD-10-CM | POA: Diagnosis present

## 2014-07-20 DIAGNOSIS — Z9049 Acquired absence of other specified parts of digestive tract: Secondary | ICD-10-CM | POA: Diagnosis present

## 2014-07-20 LAB — BASIC METABOLIC PANEL
ANION GAP: 16 — AB (ref 5–15)
Anion gap: 14 (ref 5–15)
BUN: 12 mg/dL (ref 6–23)
BUN: 11 mg/dL (ref 6–23)
CALCIUM: 9.1 mg/dL (ref 8.4–10.5)
CHLORIDE: 94 meq/L — AB (ref 96–112)
CO2: 27 mEq/L (ref 19–32)
CO2: 27 meq/L (ref 19–32)
CREATININE: 1.01 mg/dL (ref 0.50–1.35)
Calcium: 9.3 mg/dL (ref 8.4–10.5)
Chloride: 96 mEq/L (ref 96–112)
Creatinine, Ser: 0.94 mg/dL (ref 0.50–1.35)
GFR calc Af Amer: >90 mL/min (ref >90)
GFR calc Af Amer: >90 mL/min (ref >90)
GFR calc non Af Amer: 84 mL/min — ABNORMAL LOW (ref >90)
GLUCOSE: 124 mg/dL — AB (ref 70–99)
Glucose, Bld: 156 mg/dL — ABNORMAL HIGH (ref 70–99)
Potassium: 3.8 mEq/L (ref 3.7–5.3)
Potassium: 3.9 mEq/L (ref 3.7–5.3)
SODIUM: 137 meq/L (ref 137–147)
Sodium: 137 mEq/L (ref 137–147)

## 2014-07-20 LAB — CBC
HCT: 44.1 % (ref 39.0–52.0)
Hemoglobin: 14.8 g/dL (ref 13.0–17.0)
MCH: 30.5 pg (ref 26.0–34.0)
MCHC: 33.6 g/dL (ref 30.0–36.0)
MCV: 90.9 fL (ref 78.0–100.0)
Platelets: 198 10*3/uL (ref 150–400)
RBC: 4.85 MIL/uL (ref 4.22–5.81)
RDW: 13.3 % (ref 11.5–15.5)
WBC: 11 10*3/uL — AB (ref 4.0–10.5)

## 2014-07-20 LAB — MAGNESIUM: Magnesium: 2 mg/dL (ref 1.5–2.5)

## 2014-07-20 LAB — TSH: TSH: 5.5 u[IU]/mL — AB (ref 0.350–4.500)

## 2014-07-20 MED ORDER — DOFETILIDE 500 MCG PO CAPS
500.0000 ug | ORAL_CAPSULE | Freq: Two times a day (BID) | ORAL | Status: DC
  Administered 2014-07-20 – 2014-07-23 (×6): 500 ug via ORAL
  Filled 2014-07-20 (×6): qty 1

## 2014-07-20 MED ORDER — TECHNETIUM TC 99M SESTAMIBI GENERIC - CARDIOLITE
10.0000 | Freq: Once | INTRAVENOUS | Status: AC | PRN
  Administered 2014-07-20: 10 via INTRAVENOUS

## 2014-07-20 MED ORDER — POTASSIUM CHLORIDE CRYS ER 20 MEQ PO TBCR
40.0000 meq | EXTENDED_RELEASE_TABLET | Freq: Once | ORAL | Status: AC
  Administered 2014-07-20: 40 meq via ORAL
  Filled 2014-07-20: qty 2

## 2014-07-20 MED ORDER — TECHNETIUM TC 99M SESTAMIBI GENERIC - CARDIOLITE: 10.0000 | Freq: Once | INTRAVENOUS | Status: AC | PRN

## 2014-07-20 MED ORDER — REGADENOSON 0.4 MG/5ML IV SOLN
0.4000 mg | Freq: Once | INTRAVENOUS | Status: AC
  Administered 2014-07-20: 0.4 mg via INTRAVENOUS
  Filled 2014-07-20: qty 5

## 2014-07-20 MED ORDER — REGADENOSON 0.4 MG/5ML IV SOLN
INTRAVENOUS | Status: AC
  Filled 2014-07-20: qty 5

## 2014-07-20 MED ORDER — TECHNETIUM TC 99M SESTAMIBI GENERIC - CARDIOLITE
30.0000 | Freq: Once | INTRAVENOUS | Status: AC | PRN
Start: 2014-07-20 — End: 2014-07-20
  Administered 2014-07-20: 30 via INTRAVENOUS

## 2014-07-20 MED ORDER — SODIUM CHLORIDE 0.9 % IJ SOLN
3.0000 mL | Freq: Two times a day (BID) | INTRAMUSCULAR | Status: DC
  Administered 2014-07-20 – 2014-07-23 (×6): 3 mL via INTRAVENOUS

## 2014-07-20 MED ORDER — POTASSIUM CHLORIDE CRYS ER 20 MEQ PO TBCR: 20.0000 meq | EXTENDED_RELEASE_TABLET | Freq: Once | ORAL | Status: DC

## 2014-07-20 MED ORDER — POTASSIUM CHLORIDE CRYS ER 20 MEQ PO TBCR
40.0000 meq | EXTENDED_RELEASE_TABLET | Freq: Once | ORAL | Status: AC
  Administered 2014-07-20: 40 meq via ORAL
  Filled 2014-07-20: qty 4

## 2014-07-20 MED ORDER — SODIUM CHLORIDE 0.9 % IV SOLN: 250.0000 mL | INTRAVENOUS | Status: DC | PRN

## 2014-07-20 MED ORDER — SODIUM CHLORIDE 0.9 % IJ SOLN: 3.0000 mL | INTRAMUSCULAR | Status: DC | PRN

## 2014-07-20 NOTE — Progress Notes (Addendum)
Pharmacy Consult for Dofetilide (Tikosyn) Initiation  Admit Complaint: 53 y.o. male admitted 07/18/2014 with atrial fibrillation to be initiated on dofetilide.   Assessment:  Patient Exclusion Criteria: If any screening criteria checked as "Yes", then  patient  should NOT receive dofetilide until criteria item is corrected. If "Yes" please indicate correction plan.  YES  NO Patient  Exclusion Criteria Correction Plan  [x]  []  Baseline QTc interval is greater than or equal to 440 msec. IF above YES box checked dofetilide contraindicated unless patient has ICD; then may proceed if QTc 500-550 msec or with known ventricular conduction abnormalities may proceed with QTc 550-600 msec. QTc = 488  Per MD [name], patient's QTc was within range while in NSR --> Ok to continue  []  [x]  Magnesium level is less than 1.8 mEq/l : Last magnesium:  Lab Results  Component Value Date   MG 2.0 07/20/2014         [x]  []  Potassium level is less than 4 mEq/l : Last potassium:  Lab Results  Component Value Date   K 3.8 07/20/2014       Potassium chloride 58mEq ordered. K will be rechecked following potassium dose. Tikosyn order has been placed on hold until K is corrected  []  [x]  Patient is known or suspected to have a digoxin level greater than 2 ng/ml: No results found for: DIGOXIN    []  [x]  Creatinine clearance less than 20 ml/min (calculated using Cockcroft-Gault, actual body weight and serum creatinine): Estimated Creatinine Clearance: 118.5 mL/min (by C-G formula based on Cr of 0.94).    [x]  []  Patient has received drugs known to prolong the QT intervals within the last 48 hours(phenothiazines, tricyclics or tetracyclic antidepressants, erythromycin, H-1 antihistamines, cisapride, fluoroquinolones, azithromycin). Drugs not listed above may have an, as yet, undetected potential to prolong the QT interval, updated information on QT prolonging agents is available at this website:QT prolonging  agents * Patient is on Citalopram MD [name] aware that patient is on Citalopram. Per discussion, since Citalopram is not an absolute contraindication in references, OK per MD to proceed with Tikosyn initiation with close monitoring.   []  [x]  Patient received a dose of hydrochlorothiazide (Oretic) alone or in any combination including triamterene (Dyazide, Maxzide) in the last 48 hours.   []  [x]  Patient received a medication known to increase dofetilide plasma concentrations prior to initial dofetilide dose:  . Trimethoprim (Primsol, Proloprim) in the last 36 hours . Verapamil (Calan, Verelan) in the last 36 hours or a sustained release dose in the last 72 hours . Megestrol (Megace) in the last 5 days  . Cimetidine (Tagamet) in the last 6 hours . Ketoconazole (Nizoral) in the last 24 hours . Itraconazole (Sporanox) in the last 48 hours  . Prochlorperazine (Compazine) in the last 36 hours    []  [x]  Patient is known to have a history of torsades de pointes; congenital or acquired long QT syndromes.   []  [x]  Patient has received a Class 1 antiarrhythmic with less than 2 half-lives since last dose. (Disopyramide, Quinidine, Procainamide, Lidocaine, Mexiletine, Flecainide, Propafenone)   []  [x]  Patient has received amiodarone therapy in the past 3 months or amiodarone level is greater than 0.3 ng/ml.    Patient is receiving anticoagulation with Apixaban.  Ordering provider was confirmed at [email or web address] if they are not listed on the [facility] Prescribers list.  Goal of Therapy: Follow renal function, electrolytes, potential drug interactions, and dose adjustment. Provide education and 1 week supply at discharge.  Plan:  [x]   Physician selected initial dose within range recommended for patients level of renal function - will monitor for response.  []   Physician selected initial dose outside of range recommended for patients level of renal function - will discuss if the dose should be  altered at this time.   Select One Calculated CrCl  Dose q12h  [x]  > 60 ml/min 500 mcg  []  40-60 ml/min 250 mcg  []  20-40 ml/min 125 mcg   2. Follow up QTc after the first 5 doses, renal function, electrolytes (K & Mg) daily x 3     days, dose adjustment, success of initiation and facilitate 1 week discharge supply as     clinically indicated.  3. Initiate Tikosyn education video (Call [phone] and ask for video # [phone]).  4. Place Enrollment Form on the chart for discharge supply of dofetilide.   Earleen Newport 2:21 PM 07/20/2014

## 2014-07-20 NOTE — Progress Notes (Signed)
[facility] PROGRESS NOTE  Harold Scott ATF:[id number] DOB: Sep 13, 1960 DOA: 07/18/2014 PCP: [name], MD  Assessment/Plan: 1-A fib, paroxysmal; Patient presents with Chest pain, epigastric pain, palpitation.  Appreciate cardiology help. Started on Eliquis.  Continue with metoprolol.  Cardiology [planning possible TEE and cardioversion.   2-Chest pain, epigastric pain. Abdominal US negative for gallstone, only fatty liver. No splenomegaly.  Continue with protonix. Cardiology consulted. planning stress test.   3-Hyperlipidemia;  Started on  simvastatin.   4-HTN; continue with Cozaar.   5-Leukocytosis;chronic : needs to follow up with PCP. Might need referral to hematologist. No splenomegaly on [place].   Code Status: Full Code.  Family Communication: care discussed with wife who was at bedside.  Disposition Plan: to be determine. Transfer to [facility] for stress test and possible TEE   Consultants:  Cardiology  Procedures:  ECHO Normal EF.   Antibiotics:  none  HPI/Subjective: He is feeling ok, no complaints.   Objective: Filed Vitals:   07/20/14 0421  BP: 149/91  Pulse: 84  Temp: 98.5 F (36.9 C)  Resp: 18   No intake or output data in the 24 hours ending 07/20/14 0749 Filed Weights   07/18/14 2150 07/19/14 0320  Weight: 127.007 kg (280 lb) 127.098 kg (280 lb 3.2 oz)    Exam:   General:  Alert in no distress.   Cardiovascular: S 1, S 2 IRR  Respiratory: CTA  Abdomen: BS present, soft, NT  Musculoskeletal: no edema.   Data Reviewed: Basic Metabolic Panel:  Recent Labs Lab 07/18/14 2156 07/19/14 0425 07/20/14 0418  NA 133* 131* 137  K 4.0 3.8 3.8  CL 94* 89* 96  CO2 25 28 27   GLUCOSE 109* 138* 156*  BUN 8 8 12   CREATININE 0.72 0.78 0.94  CALCIUM 9.3 9.2 9.3  MG  --   --  2.0   Liver Function Tests:  Recent Labs Lab 07/19/14 0425  AST 33  ALT 43  ALKPHOS 104  BILITOT 0.7  PROT 7.7  ALBUMIN 4.1    Recent  Labs Lab 07/18/14 2156  LIPASE 30   No results for input(s): AMMONIA in the last 168 hours. CBC:  Recent Labs Lab 07/18/14 2156 07/19/14 0425 07/20/14 0418  WBC 12.1* 12.1* 11.0*  NEUTROABS  --  9.2*  --   HGB 14.5 14.4 14.8  HCT 42.6 42.4 44.1  MCV 90.3 91.0 90.9  PLT 250 239 198   Cardiac Enzymes:  Recent Labs Lab 07/19/14 0425 07/19/14 1015 07/19/14 1650  TROPONINI <0.30 <0.30 <0.30   BNP (last 3 results)  Recent Labs  07/18/14 2156  PROBNP 1325.0*   CBG: No results for input(s): GLUCAP in the last 168 hours.  No results found for this or any previous visit (from the past 240 hour(s)).   Studies: US Abdomen Complete  07/19/2014   CLINICAL DATA:  Abdominal and chest pain for 1 day; history of hepatomegaly and cholecystectomy  EXAM: ULTRASOUND ABDOMEN COMPLETE  COMPARISON:  CT scan of the abdomen pelvis of February 13, 2014  FINDINGS: Gallbladder: The gallbladder is surgically absent.  Common bile duct: Diameter: 3.6 mm  Liver: The hepatic echotexture is increased diffusely. There is no focal mass nor ductal dilation. The liver does not appear significantly increased in size.  IVC: No abnormality visualized.  Pancreas: Visualized portion unremarkable.  Spleen: Size and appearance within normal limits.  Right Kidney: Length: 13.4 cm. Echogenicity within normal limits. No mass or hydronephrosis visualized.  Left Kidney: Length:  13 cm. Echogenicity within normal limits. No mass or hydronephrosis visualized.  Abdominal aorta: The abdominal aorta was obscured in its mid and distal portions. Proximally the caliber is normal at 2.3 mm.  Other findings: No ascites is demonstrated.  IMPRESSION: No acute intra-abdominal abnormality is demonstrated. Increased hepatic echotexture is consistent with fatty infiltration.   Electronically Signed   By: David  Martinique   On: 07/19/2014 08:45   Dg Chest Portable 1 View  07/18/2014   CLINICAL DATA:  Chest pain.  Initial encounter.  EXAM:  PORTABLE CHEST - 1 VIEW  COMPARISON:  07/28/2012 and prior chest radiographs  FINDINGS: The cardiomediastinal silhouette is unremarkable.  There is no evidence of focal airspace disease, pulmonary edema, suspicious pulmonary nodule/mass, pleural effusion, or pneumothorax. No acute bony abnormalities are identified.  IMPRESSION: No active disease.   Electronically Signed   By: Hassan Rowan M.D.   On: 07/18/2014 22:58    Scheduled Meds: . apixaban  5 mg Oral BID  . cholestyramine  4 g Oral Q24H  . citalopram  10 mg Oral Daily  . clorazepate  7.5 mg Oral QHS  . losartan  50 mg Oral Daily  . metoprolol succinate  50 mg Oral Daily  . pantoprazole  40 mg Oral Daily  . simvastatin  20 mg Oral q1800   Continuous Infusions:   Active Problems:   PAF (paroxysmal atrial fibrillation)   Essential hypertension   Obstructive sleep apnea   Diastolic CHF, chronic   Chest pain   Hyponatremia   Leukocytosis   Hepatic steatosis    Time spent: 35 minutes.     Niel Hummer A  [facility] Pager [phone]. If 7PM-7AM, please contact night-coverage at [email or web address], password [facility] 07/20/2014, 7:49 AM  LOS: 2 days

## 2014-07-20 NOTE — Progress Notes (Signed)
RT entered room to place patient on CPAP. Patient has his home CPAP and stated he places himself on before sleep.

## 2014-07-20 NOTE — Consult Note (Addendum)
ELECTROPHYSIOLOGY CONSULT NOTE    Patient ID: Harold Scott MRN: [id number], DOB/AGE: 12/09/1960 53 y.o.  Admit date: 07/18/2014 Date of Consult: 07-20-2014  Primary Physician: [name], MD Primary Cardiologist: Johnsie Cancel Electrophysiologist: Sufyan Meidinger  Reason for Consultation: atrial fibrillation  HPI:  Harold Scott is a 53 y.o. male with a past medical history significant for paroxysmal atrial fibrillation, obesity, sleep apnea (on CPAP), and GERD.  He has previously been unable to tolerate medical therapy with Multaq and Flecainide due to shortness of breath.  He was last seen by Dr [name] 10/2013 at which time his AF burden had improved with use of CPAP and he did not wish to pursue further treatment options at that time.   He was admitted 07/19/2014 to [facility] with recurrent atrial fibrillation, dyspnea, and epigastric pain.  Myoview done this morning with results pending.  He was transferred to [facility] for further evaluation.  He converted to SR during transfer to [facility] this morning.   He feels like he is having episodes of atrial fibrillation once per week and that his episodes last 24-36 hours at a time.  He is symptomatic with these episodes with shortness of breath.  He was in atrial fibrillation yesterday but thought that he was in sinus rhythm.   Echo 07/19/14 demonstrated EF 60-65%, no RWMA, LA 56. Lab work this admission is notable for TSH 5.500, WBC 11, normal electrolytes (K 3.8).   Past Medical History  Diagnosis Date  . Anxiety   . Depression   . GERD (gastroesophageal reflux disease)   . Gallstones   . Asthma   . PONV (postoperative nausea and vomiting)   . Paroxysmal atrial fibrillation     a.  previously intolerant of Flecainide and Multaq  . Obstructive sleep apnea     a. on CPAP, followed by [facility]     Surgical History:  Past Surgical History  Procedure Laterality Date  . Hand surgery    . Tonsillectomy    . Wisdom tooth extraction    . Cholecystectomy   08/05/2012    Procedure: LAPAROSCOPIC CHOLECYSTECTOMY WITH INTRAOPERATIVE CHOLANGIOGRAM;  Surgeon: [name], MD,FACS;  Location: [place];  Service: General;  Laterality: N/A;  . Laparoscopic appendectomy N/A 02/13/2014    Procedure: APPENDECTOMY LAPAROSCOPIC;  Surgeon: [name], MD;  Location: [facility];  Service: General;  Laterality: N/A;     Prescriptions prior to admission  Medication Sig Dispense Refill Last Dose  . albuterol (PROVENTIL HFA;VENTOLIN HFA) 108 (90 BASE) MCG/ACT inhaler Inhale 2 puffs into the lungs every 6 (six) hours as needed. Or shortness of breath   07/18/2014 at Unknown time  . cholestyramine (QUESTRAN) 4 G packet TAKE 2 PACKETS DAILY 60 packet 10 07/18/2014 at Unknown time  . citalopram (CELEXA) 10 MG tablet Take 1 tablet (10 mg total) by mouth daily. 90 tablet 2 07/18/2014 at Unknown time  . clorazepate (TRANXENE) 7.5 MG tablet Take 1 tablet (7.5 mg total) by mouth at bedtime. 30 tablet 5 07/18/2014 at Unknown time  . esomeprazole (NEXIUM) 40 MG capsule Take 40 mg by mouth daily at 12 noon.   07/18/2014 at Unknown time  . guaiFENesin 200 MG tablet Take 200 mg by mouth 2 (two) times daily as needed (for cold).   07/18/2014 at Unknown time  . ibuprofen (ADVIL,MOTRIN) 200 MG tablet Take 400 mg by mouth every 6 (six) hours as needed. For pain   Past Week at Unknown time  . losartan (COZAAR) 50  MG tablet Take 1 tablet (50 mg total) by mouth daily. 90 tablet 3 07/18/2014 at Unknown time  . metoprolol succinate (TOPROL-XL) 50 MG 24 hr tablet TAKE 1 TABLET EVERY DAY 90 tablet 3 07/18/2014 at 0730    Inpatient Medications:  . apixaban  5 mg Oral BID  . cholestyramine  4 g Oral Q24H  . citalopram  10 mg Oral Daily  . clorazepate  7.5 mg Oral QHS  . losartan  50 mg Oral Daily  . metoprolol succinate  50 mg Oral Daily  . pantoprazole  40 mg Oral Daily  . potassium chloride  20 mEq Oral Once  . simvastatin  20 mg Oral q1800    Allergies:  Allergies  Allergen  Reactions  . Flecainide Other (See Comments)    EXTREME SOB  . Multaq [Dronedarone] Shortness Of Breath  . Penicillins Hives    History   Social History  . Marital Status: Married    Spouse Name: N/A    Number of Children: 1  . Years of Education: N/A   Occupational History  . Courier    Social History Main Topics  . Smoking status: Former Smoker -- 0.50 packs/day for 30 years    Types: Cigarettes    Quit date: 07/23/2006  . Smokeless tobacco: Never Used  . Alcohol Use: 4.2 - 8.4 oz/week    7-14 Not specified per week     Comment: social  . Drug Use: No  . Sexual Activity: Not on file   Other Topics Concern  . Not on file   Social History Narrative   Pt works as a Secondary school teacher     Family History  Problem Relation Age of Onset  . Adopted: Yes    Physical Exam: Filed Vitals:   07/19/14 2029 07/20/14 0421 07/20/14 1204 07/20/14 1207  BP: 125/77 149/91 138/82   Pulse: 72 84 71   Temp: 98.1 F (36.7 C) 98.5 F (36.9 C) 98.4 F (36.9 C)   TempSrc: Oral Oral Oral   Resp: 18 18 16    Height:    5\' 8"  (1.727 m)  Weight:    276 lb 1.6 oz (125.238 kg)  SpO2: 95% 97% 95%     GEN- The patient is overweight appearing, alert and oriented x 3 today.   Head- normocephalic, atraumatic Eyes-  Sclera clear, conjunctiva pink Ears- hearing intact Oropharynx- clear Neck- supple Lungs- Clear to ausculation bilaterally, normal work of breathing Heart- Regular rate and rhythm, no murmurs, rubs or gallops  GI- soft, NT, ND, + BS Extremities- no clubbing, cyanosis, or edema, 2+ DP/PT pulses MS- no significant deformity or atrophy Skin- no rash or lesion Psych- euthymic mood, full affect Neuro- strength and sensation are intact  Labs:   Lab Results  Component Value Date   WBC 11.0* 07/20/2014   HGB 14.8 07/20/2014   HCT 44.1 07/20/2014   MCV 90.9 07/20/2014   PLT 198 07/20/2014     Recent Labs Lab 07/19/14 0425 07/20/14 0418  NA 131* 137  K 3.8 3.8  CL 89* 96    CO2 28 27  BUN 8 12  CREATININE 0.78 0.94  CALCIUM 9.2 9.3  PROT 7.7  --   BILITOT 0.7  --   ALKPHOS 104  --   ALT 43  --   AST 33  --   GLUCOSE 138* 156*     Radiology/Studies: US Abdomen Complete 07/19/2014   CLINICAL DATA:  Abdominal and chest pain for 1 day;  history of hepatomegaly and cholecystectomy  EXAM: ULTRASOUND ABDOMEN COMPLETE  COMPARISON:  CT scan of the abdomen pelvis of February 13, 2014  FINDINGS: Gallbladder: The gallbladder is surgically absent.  Common bile duct: Diameter: 3.6 mm  Liver: The hepatic echotexture is increased diffusely. There is no focal mass nor ductal dilation. The liver does not appear significantly increased in size.  IVC: No abnormality visualized.  Pancreas: Visualized portion unremarkable.  Spleen: Size and appearance within normal limits.  Right Kidney: Length: 13.4 cm. Echogenicity within normal limits. No mass or hydronephrosis visualized.  Left Kidney: Length: 13 cm. Echogenicity within normal limits. No mass or hydronephrosis visualized.  Abdominal aorta: The abdominal aorta was obscured in its mid and distal portions. Proximally the caliber is normal at 2.3 mm.  Other findings: No ascites is demonstrated.  IMPRESSION: No acute intra-abdominal abnormality is demonstrated. Increased hepatic echotexture is consistent with fatty infiltration.   Electronically Signed   By: David  Martinique   On: 07/19/2014 08:45   Dg Chest Portable 1 View 07/18/2014   CLINICAL DATA:  Chest pain.  Initial encounter.  EXAM: PORTABLE CHEST - 1 VIEW  COMPARISON:  07/28/2012 and prior chest radiographs  FINDINGS: The cardiomediastinal silhouette is unremarkable.  There is no evidence of focal airspace disease, pulmonary edema, suspicious pulmonary nodule/mass, pleural effusion, or pneumothorax. No acute bony abnormalities are identified.  IMPRESSION: No active disease.   Electronically Signed   By: Hassan Rowan M.D.   On: 07/18/2014 22:58   EKG: atrial fibrillation, repeat EKG in SR  pending  TELEMETRY: atrial fibrillation with conversion to SR today  ASSESSMENT/PLAN: 1.  Persistent atrial fibrillation He has previously been intolerant to Multaq and Flecainide Episodes have been increasing in frequency over time LA size is 56.  I worry that given noncompliance with lifestyle modification including regular exercise and weight reduction that our ability to maintain sinus rhythm long term is low. This patients atrial arrhythmias have been very difficult to treat and represents a very complex issue.  I have been asked by Dr Stanford Breed to assist with this very difficult medical decision making process.  Pt is at high risk for readmission and further arrhythmias. Will plan to initiate tikosyn.  Will need 3 day hospitalization for initiation.   chads2vasc score is 0.  Will continue eliquis for now, though this may not be needed long term.  2.  Sleep apnea Compliant with CPAP  3.  Obesity Weight loss encouraged.  Exercise encouraged  4.  Elevated TSH Check FT4 in the morning  5. Chest pain myoview results are pending

## 2014-07-20 NOTE — Progress Notes (Signed)
Subjective: No SOB or CP  Objective: Vital signs in last 24 hours: Temp:  [98.1 F (36.7 C)-98.5 F (36.9 C)] 98.5 F (36.9 C) (12/01 0421) Pulse Rate:  [72-84] 84 (12/01 0421) Resp:  [18] 18 (12/01 0421) BP: (125-149)/(77-99) 149/91 mmHg (12/01 0421) SpO2:  [95 %-97 %] 97 % (12/01 0421) Last BM Date: 07/18/14  Intake/Output from previous day: 11/30 0701 - 12/01 0700 In: 240 [P.O.:240] Out: -  Intake/Output this shift:    Medications Current Facility-Administered Medications  Medication Dose Route Frequency Provider Last Rate Last Dose  . acetaminophen (TYLENOL) tablet 650 mg  650 mg Oral Q4H PRN Toy Baker, MD      . albuterol (PROVENTIL) (2.5 MG/3ML) 0.083% nebulizer solution 2.5 mg  2.5 mg Inhalation Q6H PRN Toy Baker, MD      . apixaban (ELIQUIS) tablet 5 mg  5 mg Oral BID Lelon Perla, MD   5 mg at 07/19/14 1749  . cholestyramine (QUESTRAN) packet 4 g  4 g Oral Q24H Belkys A Regalado, MD      . citalopram (CELEXA) tablet 10 mg  10 mg Oral Daily Toy Baker, MD   10 mg at 07/19/14 0950  . clorazepate (TRANXENE) tablet 7.5 mg  7.5 mg Oral QHS Toy Baker, MD   7.5 mg at 07/19/14 1947  . guaiFENesin tablet 200 mg  200 mg Oral BID PRN Toy Baker, MD      . losartan (COZAAR) tablet 50 mg  50 mg Oral Daily Toy Baker, MD   50 mg at 07/19/14 0950  . metoprolol succinate (TOPROL-XL) 24 hr tablet 50 mg  50 mg Oral Daily Toy Baker, MD   50 mg at 07/19/14 0950  . morphine 2 MG/ML injection 2 mg  2 mg Intravenous Q2H PRN Toy Baker, MD      . ondansetron (ZOFRAN) injection 4 mg  4 mg Intravenous Q6H PRN Toy Baker, MD      . pantoprazole (PROTONIX) EC tablet 40 mg  40 mg Oral Daily Toy Baker, MD   40 mg at 07/19/14 0950  . simvastatin (ZOCOR) tablet 20 mg  20 mg Oral q1800 Belkys A Regalado, MD   20 mg at 07/19/14 1749   Facility-Administered Medications Ordered in Other Encounters  Medication  Dose Route Frequency Provider Last Rate Last Dose  . regadenoson (LEXISCAN) 0.4 MG/5ML injection SOLN           . technetium sestamibi generic (CARDIOLITE) injection 10 milli Curie  10 milli Curie Intravenous Once PRN Medication Radiologist, MD        PE: General appearance: alert, cooperative and no distress Lungs: clear to auscultation bilaterally Heart: regular rate and rhythm, S1, S2 normal, no murmur, click, rub or gallop Extremities: No LEE Pulses: 2+ and symmetric Skin: Warm and dry Neurologic: Grossly normal  Lab Results:   Recent Labs  07/18/14 2156 07/19/14 0425 07/20/14 0418  WBC 12.1* 12.1* 11.0*  HGB 14.5 14.4 14.8  HCT 42.6 42.4 44.1  PLT 250 239 198   BMET  Recent Labs  07/18/14 2156 07/19/14 0425 07/20/14 0418  NA 133* 131* 137  K 4.0 3.8 3.8  CL 94* 89* 96  CO2 25 28 27   GLUCOSE 109* 138* 156*  BUN 8 8 12   CREATININE 0.72 0.78 0.94  CALCIUM 9.3 9.2 9.3   PT/INR No results for input(s): LABPROT, INR in the last 72 hours. Cholesterol  Recent Labs  07/19/14 0425  CHOL 228*   Lipid  Panel     Component Value Date/Time   CHOL 228* 07/19/2014 0425   TRIG 338* 07/19/2014 0425   HDL 30* 07/19/2014 0425   CHOLHDL 7.6 07/19/2014 0425   VLDL 68* 07/19/2014 0425   LDLCALC 130* 07/19/2014 0425     Cardiac Panel (last 3 results)  Recent Labs  07/19/14 0425 07/19/14 1015 07/19/14 1650  TROPONINI <0.30 <0.30 <0.30    Assessment/Plan  Active Problems:   PAF (paroxysmal atrial fibrillation) Converted to [facility] on the way over from [facility].  Ep to see regarding tikosyn start.  TSH mildly elevated.  T3/4 pending   Essential hypertension  BP stable    Obstructive sleep apnea   Diastolic CHF, chronic  Appears euvolemic.   Chest pain  Ruled out for MI.  He tolerated the lexiscan well.   Hyponatremia: resolved   Leukocytosis: improving   Hepatic steatosis     LOS: 2 days    HAGER, BRYAN PA-C 07/20/2014 9:46 AM  Harold Scott, Harold Scott 07/20/2014

## 2014-07-20 NOTE — Progress Notes (Signed)
UR completed 

## 2014-07-20 NOTE — Progress Notes (Signed)
The patient gone for a stress test in [facility]. If negative we will plan for TEE tomorrow.   Dorothy Spark 07/20/2014

## 2014-07-20 NOTE — Care Management Note (Signed)
    Page 1 of 1   07/22/2014     11:07:36 AM CARE MANAGEMENT NOTE 07/22/2014  Patient:  Harold Scott, Harold Scott   Account Number:  [id number]  Date Initiated:  07/20/2014  Documentation initiated by:  GRAVES-BIGELOW,Bennie Chirico  Subjective/Objective Assessment:   Pt admitted for recurrent atrial fibrillation, dyspnea, and epigastric pain. Per MD notes will plan to initiate Tikosyn.     Action/Plan:   Benefits check in process for Tikosyn. Will make pt aware of cost once completed. CM to assit with the 7 day supply.   Anticipated DC Date:  07/23/2014   Anticipated DC Plan:  [place]  CM consult      Choice offered to / List presented to:             Status of service:  Completed, signed off Medicare Important Message given?  NO (If response is "NO", the following Medicare IM given date fields will be blank) Date Medicare IM given:   Medicare IM given by:   Date Additional Medicare IM given:   Additional Medicare IM given by:    Discharge Disposition:  HOME/SELF CARE  Per UR Regulation:  Reviewed for med. necessity/level of care/duration of stay  If discussed at [place] of Stay Meetings, dates discussed:    Comments:   07-22-14 1103 Jacqlyn Krauss, RN,BSN [phone] CM did speak with pt in regards to Tikosyn and he uses [place] is on [employer]. Pt will need the 10.00 co pay card for next year. CM did make pt aware that he could go to [employer] and sign up for card. No further needs from CM at this time.   Pt will not have a co pay for Tikosyn. [id number] 07-21-14 Jacqlyn Krauss, RN,BSN [phone]

## 2014-07-20 NOTE — Discharge Instructions (Signed)

## 2014-07-20 NOTE — Progress Notes (Signed)
Nutrition Education Note  RD consulted for nutrition education regarding a Heart Healthy diet.   Lipid Panel     Component Value Date/Time   CHOL 228* 07/19/2014 0425   TRIG 338* 07/19/2014 0425   HDL 30* 07/19/2014 0425   CHOLHDL 7.6 07/19/2014 0425   VLDL 68* 07/19/2014 0425   LDLCALC 130* 07/19/2014 0425    RD provided "Heart Healthy Nutrition Therapy" handout from the Academy of Nutrition and Dietetics. Reviewed patient's dietary recall. Provided examples on ways to decrease sodium and fat intake in diet. Discouraged intake of processed foods and use of salt shaker. Encouraged fresh fruits and vegetables as well as whole grain sources of carbohydrates to maximize fiber intake. Teach back method used.  Expect good compliance.  Body mass index is 41.99 kg/(m^2). Pt meets criteria for class 3, extreme/morbid obesity based on current BMI.  Current diet order is heart healthy. Labs and medications reviewed. No further nutrition interventions warranted at this time. RD contact information provided. If additional nutrition issues arise, please re-consult RD.   Molli Barrows, RD, LDN, [place] Pager [phone] After Hours Pager [phone]

## 2014-07-20 NOTE — Plan of Care (Signed)
Problem: Phase II Progression Outcomes Goal: Progress activity as tolerated unless otherwise ordered Outcome: Completed/Met Date Met:  07/20/14     

## 2014-07-21 ENCOUNTER — Other Ambulatory Visit: Payer: Self-pay

## 2014-07-21 LAB — BASIC METABOLIC PANEL
Anion gap: 14 (ref 5–15)
BUN: 11 mg/dL (ref 6–23)
CHLORIDE: 99 meq/L (ref 96–112)
CO2: 26 mEq/L (ref 19–32)
Calcium: 9.1 mg/dL (ref 8.4–10.5)
Creatinine, Ser: 0.92 mg/dL (ref 0.50–1.35)
GFR calc Af Amer: >90 mL/min (ref >90)
Glucose, Bld: 134 mg/dL — ABNORMAL HIGH (ref 70–99)
POTASSIUM: 4.2 meq/L (ref 3.7–5.3)
SODIUM: 139 meq/L (ref 137–147)

## 2014-07-21 LAB — MAGNESIUM: Magnesium: 2 mg/dL (ref 1.5–2.5)

## 2014-07-21 LAB — T4, FREE: Free T4: 1.08 ng/dL (ref 0.80–1.80)

## 2014-07-21 NOTE — Progress Notes (Signed)
SUBJECTIVE: The patient is doing well today.  At this time, he denies chest pain, shortness of breath, or any new concerns.  CURRENT MEDICATIONS: . apixaban  5 mg Oral BID  . cholestyramine  4 g Oral Q24H  . citalopram  10 mg Oral Daily  . clorazepate  7.5 mg Oral QHS  . dofetilide  500 mcg Oral BID  . losartan  50 mg Oral Daily  . metoprolol succinate  50 mg Oral Daily  . pantoprazole  40 mg Oral Daily  . simvastatin  20 mg Oral q1800  . sodium chloride  3 mL Intravenous Q12H      OBJECTIVE: Physical Exam: Filed Vitals:   07/20/14 1204 07/20/14 1207 07/20/14 2003 07/21/14 0700  BP: 138/82  148/79 140/80  Pulse: 71  65 70  Temp: 98.4 F (36.9 C)  98.3 F (36.8 C) 97.8 F (36.6 C)  TempSrc: Oral  Oral Oral  Resp: 16  18 18   Height:  5\' 8"  (1.727 m)    Weight:  276 lb 1.6 oz (125.238 kg)    SpO2: 95%  96% 98%    Intake/Output Summary (Last 24 hours) at 07/21/14 0737 Last data filed at 07/20/14 1700  Gross per 24 hour  Intake    240 ml  Output      0 ml  Net    240 ml    Telemetry reveals sinus rhythm  GEN- The patient is well appearing, alert and oriented x 3 today.   Head- normocephalic, atraumatic Eyes-  Sclera clear, conjunctiva pink Ears- hearing intact Oropharynx- clear Neck- supple, no JVP Lymph- no cervical lymphadenopathy Lungs- Clear to ausculation bilaterally, normal work of breathing Heart- Regular rate and rhythm, no murmurs, rubs or gallops, PMI not laterally displaced GI- soft, NT, ND, + BS Extremities- no clubbing, cyanosis, or edema Skin- no rash or lesion Psych- euthymic mood, full affect Neuro- strength and sensation are intact  LABS: Basic Metabolic Panel:  Recent Labs  07/20/14 0418 07/20/14 1638 07/21/14 0400  NA 137 137 139  K 3.8 3.9 4.2  CL 96 94* 99  CO2 27 27 26   GLUCOSE 156* 124* 134*  BUN 12 11 11   CREATININE 0.94 1.01 0.92  CALCIUM 9.3 9.1 9.1  MG 2.0  --  2.0   Liver Function Tests:  Recent Labs  07/19/14 0425  AST 33  ALT 43  ALKPHOS 104  BILITOT 0.7  PROT 7.7  ALBUMIN 4.1    Recent Labs  07/18/14 2156  LIPASE 30   CBC:  Recent Labs  07/19/14 0425 07/20/14 0418  WBC 12.1* 11.0*  NEUTROABS 9.2*  --   HGB 14.4 14.8  HCT 42.4 44.1  MCV 91.0 90.9  PLT 239 198   Cardiac Enzymes:  Recent Labs  07/19/14 0425 07/19/14 1015 07/19/14 1650  TROPONINI <0.30 <0.30 <0.30   Fasting Lipid Panel:  Recent Labs  07/19/14 0425  CHOL 228*  HDL 30*  LDLCALC 130*  TRIG 338*  CHOLHDL 7.6   Thyroid Function Tests:  Recent Labs  07/20/14 0418  TSH 5.500*    RADIOLOGY: Nm Myocar Multi W/spect W/wall Motion / Ef 07/20/2014   CLINICAL DATA:  Patient is a 53 yo with CP Test to r/o ischemia  EXAM: MYOCARDIAL IMAGING WITH SPECT (REST AND PHARMACOLOGIC-STRESS)  GATED LEFT VENTRICULAR WALL MOTION STUDY  LEFT VENTRICULAR EJECTION FRACTION  TECHNIQUE: Standard myocardial SPECT imaging was performed after resting intravenous injection of 10 mCi Tc-74m sestamibi. Subsequently, intravenous  infusion of Lexiscan was performed under the supervision of the Cardiology staff. At peak effect of the drug, 30 mCi Tc-35m sestamibi was injected intravenously and standard myocardial SPECT imaging was performed. Quantitative gated imaging was also performed to evaluate left ventricular wall motion, and estimate left ventricular ejection fraction.  COMPARISON:  None.  FINDINGS: Stess data: Baseline EKG: SR Nonspecific ST changes. With infusion of Lexiscan there were no ST changes to suggest ischemia  Nuclear data: In the initial stress images there was mild thinning in the inferolateral wall (base) Otherwise normal perfusion. In the recovery images there was no significant change  On gating, LVEF was calculated at 51% with inferior hypokinesis.  ON review of the raw data, diaphragm and bowel activity underlie the inferior/inferoseptal wall.  IMPRESSION: Electrically negative for ischemia. Myoview scan  with a small region of mild basal inferolateral thinning consistent with probable soft tissue attenuation, cannot r/o subendocardial scar. No significant ischemia  LVEF on gating calculated at 51% with inferior hypokinesis. Consider echo to further define wall motion  Overall low risk scan   Electronically Signed   By: Dorris Carnes M.D.   On: 07/20/2014 14:57   Dg Chest Portable 1 View 07/18/2014   CLINICAL DATA:  Chest pain.  Initial encounter.  EXAM: PORTABLE CHEST - 1 VIEW  COMPARISON:  07/28/2012 and prior chest radiographs  FINDINGS: The cardiomediastinal silhouette is unremarkable.  There is no evidence of focal airspace disease, pulmonary edema, suspicious pulmonary nodule/mass, pleural effusion, or pneumothorax. No acute bony abnormalities are identified.  IMPRESSION: No active disease.   Electronically Signed   By: Hassan Rowan M.D.   On: 07/18/2014 22:58    ASSESSMENT AND PLAN:  Active Problems:   PAF (paroxysmal atrial fibrillation)   Essential hypertension   Obstructive sleep apnea   Diastolic CHF, chronic   Chest pain   Hyponatremia   Leukocytosis   Hepatic steatosis   Paroxysmal atrial fibrillation   ASSESSMENT/PLAN: 1. Persistent atrial fibrillation He has previously been intolerant to Multaq and Flecainide Episodes have been increasing in frequency over time Continue Tikosyn as qt is stable Keep Mg >1.8, K >4 chads2vasc score is 0. Will continue eliquis for now, though this may not be needed long term.  2. Sleep apnea Compliant with CPAP  3. Obesity Weight loss encouraged. Exercise encouraged  4. Elevated TSH FT4 pending  5. Chest pain myoview low risk  Plan discharge 07-23-14. Will schedule 1 week visit with [facility] pharmacist, 4 weeks with Roderic Palau, NP in AF clinic, and 3 month with Dr [name].

## 2014-07-21 NOTE — Progress Notes (Signed)
Pt places self on home CPAP.  

## 2014-07-22 ENCOUNTER — Other Ambulatory Visit: Payer: Self-pay

## 2014-07-22 DIAGNOSIS — I4819 Other persistent atrial fibrillation: Secondary | ICD-10-CM | POA: Insufficient documentation

## 2014-07-22 DIAGNOSIS — I4891 Unspecified atrial fibrillation: Secondary | ICD-10-CM

## 2014-07-22 LAB — BASIC METABOLIC PANEL
ANION GAP: 15 (ref 5–15)
BUN: 11 mg/dL (ref 6–23)
CALCIUM: 9.1 mg/dL (ref 8.4–10.5)
CO2: 25 meq/L (ref 19–32)
Chloride: 97 mEq/L (ref 96–112)
Creatinine, Ser: 0.89 mg/dL (ref 0.50–1.35)
GFR calc Af Amer: >90 mL/min (ref >90)
GFR calc non Af Amer: >90 mL/min (ref >90)
Glucose, Bld: 122 mg/dL — ABNORMAL HIGH (ref 70–99)
POTASSIUM: 4.2 meq/L (ref 3.7–5.3)
SODIUM: 137 meq/L (ref 137–147)

## 2014-07-22 LAB — MAGNESIUM: MAGNESIUM: 2 mg/dL (ref 1.5–2.5)

## 2014-07-22 NOTE — Progress Notes (Signed)
Pt has home CPAP and states he does not need anything at this time and will place on himself later. Instructed to call for RT if any assistance is needed.

## 2014-07-22 NOTE — Progress Notes (Signed)
    SUBJECTIVE: The patient is doing well today.  At this time, he denies chest pain, shortness of breath, or any new concerns.  CURRENT MEDICATIONS: . apixaban  5 mg Oral BID  . cholestyramine  4 g Oral Q24H  . citalopram  10 mg Oral Daily  . clorazepate  7.5 mg Oral QHS  . dofetilide  500 mcg Oral BID  . losartan  50 mg Oral Daily  . metoprolol succinate  50 mg Oral Daily  . pantoprazole  40 mg Oral Daily  . simvastatin  20 mg Oral q1800  . sodium chloride  3 mL Intravenous Q12H      OBJECTIVE: Physical Exam: Filed Vitals:   07/20/14 2003 07/21/14 0700 07/21/14 2058 07/22/14 0456  BP: 148/79 140/80 139/82 110/64  Pulse: 65 70 54 54  Temp: 98.3 F (36.8 C) 97.8 F (36.6 C) 98.1 F (36.7 C) 97.8 F (36.6 C)  TempSrc: Oral Oral Oral Oral  Resp: 18 18 18 18   Height:      Weight:    277 lb (125.646 kg)  SpO2: 96% 98% 97% 95%   No intake or output data in the 24 hours ending 07/22/14 0817  Telemetry reveals sinus rhythm  GEN- The patient is well appearing, alert and oriented x 3 today.   Head- normocephalic, atraumatic Eyes-  Sclera clear, conjunctiva pink Ears- hearing intact Oropharynx- clear Neck- supple, no JVP Lymph- no cervical lymphadenopathy Lungs- Clear to ausculation bilaterally, normal work of breathing Heart- Regular rate and rhythm, no murmurs, rubs or gallops, PMI not laterally displaced GI- soft, NT, ND, + BS Extremities- no clubbing, cyanosis, or edema Neuro- strength and sensation are intact  LABS: Basic Metabolic Panel:  Recent Labs  07/21/14 0400 07/22/14 0401  NA 139 137  K 4.2 4.2  CL 99 97  CO2 26 25  GLUCOSE 134* 122*  BUN 11 11  CREATININE 0.92 0.89  CALCIUM 9.1 9.1  MG 2.0 2.0   Liver Function Tests: No results for input(s): AST, ALT, ALKPHOS, BILITOT, PROT, ALBUMIN in the last 72 hours. No results for input(s): LIPASE, AMYLASE in the last 72 hours. CBC:  Recent Labs  07/20/14 0418  WBC 11.0*  HGB 14.8  HCT 44.1    MCV 90.9  PLT 198   Cardiac Enzymes:  Recent Labs  07/19/14 1015 07/19/14 1650  TROPONINI <0.30 <0.30   Fasting Lipid Panel: No results for input(s): CHOL, HDL, LDLCALC, TRIG, CHOLHDL, LDLDIRECT in the last 72 hours. Thyroid Function Tests:  Recent Labs  07/20/14 0418  TSH 5.500*    ASSESSMENT AND PLAN:  Active Problems:   PAF (paroxysmal atrial fibrillation)   Essential hypertension   Obstructive sleep apnea   Diastolic CHF, chronic   Chest pain   Hyponatremia   Leukocytosis   Hepatic steatosis   Paroxysmal atrial fibrillation   ASSESSMENT/PLAN: 1. Persistent atrial fibrillation Continue Tikosyn as qt is stable Keep Mg >1.8, K >4 chads2vasc score is 0. Will continue eliquis for now, though if he remains in sinus we will discontinue in 4 weeks  2. Sleep apnea Compliant with CPAP  3. Obesity Weight loss encouraged. Exercise encouraged  4. Elevated TSH FT4 normal No further workup  5. Chest pain myoview low risk  Plan discharge 07-23-14. Will schedule 1 week visit with [facility] pharmacist, 4 weeks with Roderic Palau, NP in AF clinic, and 3 month with me

## 2014-07-22 NOTE — Discharge Summary (Signed)
ELECTROPHYSIOLOGY PROCEDURE DISCHARGE SUMMARY    Patient ID: Harold Scott,  MRN: [id number], DOB/AGE: [birth year] 53 y.o.  Admit date: 07/18/2014 Discharge date: 07/23/2014  Primary Care Physician: [name], MD Primary Cardiologist: Johnsie Cancel Electrophysiologist: Koree Schopf  Primary Discharge Diagnosis:  1.  Paroxysmal atrial fibrillation status post Tikosyn loading this admission  Secondary Discharge Diagnosis:  1.  Sleep apnea - on CPAP 2.  Asthma 3.  GERD 4.  Anxiety/depression  Allergies  Allergen Reactions  . Flecainide Other (See Comments)    EXTREME SOB  . Multaq [Dronedarone] Shortness Of Breath  . Penicillins Hives     Procedures This Admission:  1.  Myoview - 07-20-2014 - Electrically negative for ischemia. Myoview scan with a small region of mild basal inferolateral thinning consistent with probable soft tissue attenuation, cannot r/o subendocardial scar. No significant ischemia. LVEF on gating calculated at 51% with inferior hypokinesis. Consider echo to further define wall motion Overall low risk scan 2.  Tikosyn loading   Brief HPI/Hospital Course: Harold Scott is a 53 y.o. male with a past medical history significant for paroxysmal atrial fibrillation, obesity, sleep apnea (on CPAP), and GERD. He has previously been unable to tolerate medical therapy with Multaq and Flecainide due to shortness of breath. He was last seen by Dr [name] 10/2013 at which time his AF burden had improved with use of CPAP and he did not wish to pursue further treatment options at that time. He presented 07/19/2014 to [facility] with recurrent atrial fibrillation, dyspnea, and epigastric pain. Myoview was performed and was low-risk.  He spontaneously converted to SR.  Due to increasing symptomatic atrial fibrillation, Tikosyn loading was recommended.  Renal function and electrolytes were followed during the hospitalization.  Their QTc remained stable.  He was monitored until  discharge on telemetry which demonstrated sinus rhythm.  On the day of discharge, they were examined by Dr [name] who considered them stable for discharge to home.  Follow-up has been arranged with [facility] pharmacists in 1 week, Roderic Palau, NP in 4 weeks and 3 months with Dr [name].    Discharge Vitals: Blood pressure 128/76, pulse 52, temperature 98.6 F (37 C), temperature source Oral, resp. rate 18, height 5\' 8"  (1.727 m), weight 277 lb 1.6 oz (125.692 kg), SpO2 97 %.   Physical Exam: Filed Vitals:   07/22/14 1450 07/22/14 2107 07/23/14 0435 07/23/14 0958  BP: 119/66 123/61 113/60 128/76  Pulse: 50 55 48 52  Temp: 98.2 F (36.8 C) 98.4 F (36.9 C) 98.6 F (37 C)   TempSrc: Oral Oral Oral   Resp: 16 18 18    Height:      Weight:   277 lb 1.6 oz (125.692 kg)   SpO2: 98% 97% 97%     GEN- The patient is well appearing, alert and oriented x 3 today.   Head- normocephalic, atraumatic Eyes-  Sclera clear, conjunctiva pink Ears- hearing intact Oropharynx- clear Neck- supple, Lungs- Clear to ausculation bilaterally, normal work of breathing Heart- Regular rate and rhythm, no murmurs, rubs or gallops, PMI not laterally displaced GI- soft, NT, ND, + BS Extremities- no clubbing, cyanosis, or edema  MS- no significant deformity or atrophy Skin- no rash or lesion Psych- euthymic mood, full affect Neuro- strength and sensation are intact   Labs:   Lab Results  Component Value Date   WBC 11.0* 07/20/2014   HGB 14.8 07/20/2014   HCT 44.1 07/20/2014   MCV 90.9 07/20/2014   PLT 198 07/20/2014  Recent Labs Lab 07/19/14 0425  07/23/14 0453  NA 131*  < > 134*  K 3.8  < > 4.3  CL 89*  < > 96  CO2 28  < > 24  BUN 8  < > 10  CREATININE 0.78  < > 0.83  CALCIUM 9.2  < > 9.1  PROT 7.7  --   --   BILITOT 0.7  --   --   ALKPHOS 104  --   --   ALT 43  --   --   AST 33  --   --   GLUCOSE 138*  < > 125*  < > = values in this interval not displayed.   Discharge Medications:      Medication List    TAKE these medications        albuterol 108 (90 BASE) MCG/ACT inhaler  Commonly known as:  PROVENTIL HFA;VENTOLIN HFA  Inhale 2 puffs into the lungs every 6 (six) hours as needed. Or shortness of breath     apixaban 5 MG Tabs tablet  Commonly known as:  ELIQUIS  Take 1 tablet (5 mg total) by mouth 2 (two) times daily.     cholestyramine 4 G packet  Commonly known as:  QUESTRAN  TAKE 2 PACKETS DAILY     citalopram 10 MG tablet  Commonly known as:  CELEXA  Take 1 tablet (10 mg total) by mouth daily.     clorazepate 7.5 MG tablet  Commonly known as:  TRANXENE  Take 1 tablet (7.5 mg total) by mouth at bedtime.     dofetilide 500 MCG capsule  Commonly known as:  TIKOSYN  Take 1 capsule (500 mcg total) by mouth 2 (two) times daily.     esomeprazole 40 MG capsule  Commonly known as:  NEXIUM  Take 40 mg by mouth daily at 12 noon.     guaiFENesin 200 MG tablet  Take 200 mg by mouth 2 (two) times daily as needed (for cold).     ibuprofen 200 MG tablet  Commonly known as:  ADVIL,MOTRIN  Take 400 mg by mouth every 6 (six) hours as needed. For pain     losartan 50 MG tablet  Commonly known as:  COZAAR  Take 1 tablet (50 mg total) by mouth daily.     metoprolol succinate 50 MG 24 hr tablet  Commonly known as:  TOPROL-XL  TAKE 1 TABLET EVERY DAY     simvastatin 20 MG tablet  Commonly known as:  ZOCOR  Take 1 tablet (20 mg total) by mouth daily at 6 PM.        Disposition:  Discharge Instructions    Diet - low sodium heart healthy    Complete by:  As directed      Increase activity slowly    Complete by:  As directed           Follow-up Information    Follow up with  [facility] On 07/30/2014.   Why:  Tikosyn follow-up visit and lab work at 10:30 AM   Contact information:   [place]       Follow up with Roderic Palau, NP On 08/24/2014.   Specialty:  Nurse Practitioner   Why:  At 2:30 PM    Contact information:   [place] [address] [phone]       Duration of Discharge Encounter: Greater than 30 minutes including physician time.  Signed,  [name] MD

## 2014-07-23 ENCOUNTER — Other Ambulatory Visit: Payer: Self-pay

## 2014-07-23 LAB — BASIC METABOLIC PANEL
ANION GAP: 14 (ref 5–15)
BUN: 10 mg/dL (ref 6–23)
CALCIUM: 9.1 mg/dL (ref 8.4–10.5)
CO2: 24 mEq/L (ref 19–32)
Chloride: 96 mEq/L (ref 96–112)
Creatinine, Ser: 0.83 mg/dL (ref 0.50–1.35)
GFR calc non Af Amer: >90 mL/min (ref >90)
Glucose, Bld: 125 mg/dL — ABNORMAL HIGH (ref 70–99)
Potassium: 4.3 mEq/L (ref 3.7–5.3)
SODIUM: 134 meq/L — AB (ref 137–147)

## 2014-07-23 LAB — MAGNESIUM: MAGNESIUM: 2 mg/dL (ref 1.5–2.5)

## 2014-07-23 MED ORDER — SIMVASTATIN 20 MG PO TABS: 20.0000 mg | ORAL_TABLET | Freq: Every day | ORAL | Status: DC

## 2014-07-23 MED ORDER — DOFETILIDE 500 MCG PO CAPS: 500.0000 ug | ORAL_CAPSULE | Freq: Two times a day (BID) | ORAL | Status: DC

## 2014-07-23 MED ORDER — APIXABAN 5 MG PO TABS: 5.0000 mg | ORAL_TABLET | Freq: Two times a day (BID) | ORAL | Status: DC

## 2014-07-23 NOTE — Progress Notes (Signed)
1409 07-23-14 Pt plan for d/c today. Rx for 7 day Tikosyn picke up from main pharmacy by RN. No further needs from CM at this time. Bethena Roys, RN,BSN [phone]

## 2014-07-30 ENCOUNTER — Ambulatory Visit: Payer: PRIVATE HEALTH INSURANCE | Admitting: Pharmacist

## 2014-07-30 ENCOUNTER — Ambulatory Visit (INDEPENDENT_AMBULATORY_CARE_PROVIDER_SITE_OTHER): Payer: PRIVATE HEALTH INSURANCE | Admitting: Pharmacist

## 2014-07-30 DIAGNOSIS — I4891 Unspecified atrial fibrillation: Secondary | ICD-10-CM

## 2014-07-30 LAB — MAGNESIUM: Magnesium: 1.9 mg/dL (ref 1.5–2.5)

## 2014-07-30 LAB — BASIC METABOLIC PANEL
BUN: 10 mg/dL (ref 6–23)
CO2: 24 mEq/L (ref 19–32)
Calcium: 8.8 mg/dL (ref 8.4–10.5)
Chloride: 102 mEq/L (ref 96–112)
Creat: 0.95 mg/dL (ref 0.50–1.35)
Glucose, Bld: 96 mg/dL (ref 70–99)
POTASSIUM: 4.3 meq/L (ref 3.5–5.3)
SODIUM: 137 meq/L (ref 135–145)

## 2014-07-30 NOTE — Progress Notes (Signed)
HPI  Harold Scott is a 53 y.o. male with a past medical history significant for paroxysmal atrial fibrillation, obesity, sleep apnea (on CPAP), and GERD. He has previously been unable to tolerate medical therapy with Multaq and Flecainide due to shortness of breath. He was last seen by Dr [name] 10/2013 at which time his AF burden had improved with use of CPAP and he did not wish to pursue further treatment options at that time. He presented 07/19/2014 to [facility] with recurrent atrial fibrillation, dyspnea, and epigastric pain. Myoview was performed and was low-risk. He spontaneously converted to SR. Due to increasing symptomatic atrial fibrillation, Tikosyn loading was recommended. Renal function and electrolytes were followed during the hospitalization. The QTc remained stable (473 msec on discharge). He was monitored until discharge on telemetry which demonstrated sinus rhythm.   Pt states he has had more episodes of atrial fibrillation since starting Tikosyn than before.  He went home on Friday.  Sunday, he had an episode for 4-5 hours, Monday night for 3-4 hours, Wednesday afternoon for 4-5 hours and then Friday morning for about 10 hours.  Prior to hospitalization, he would have episodes about once a week.  He did state the episodes are less severe now than before.  He thinks it may be related to more stress this week than normal.   Since discharge, pt reports not missing any doses.  He has had no problems getting his prescription from the pharmacy.  Reiterated the importance of compliance and drug interactions with Tikosyn.    EKG reviewed by Dr. Radford Pax.  Sinus bradycardia with vent rate of 56 bpm.  QTc 46msec.   Current Outpatient Prescriptions  Medication Sig Dispense Refill  . albuterol (PROVENTIL HFA;VENTOLIN HFA) 108 (90 BASE) MCG/ACT inhaler Inhale 2 puffs into the lungs every 6 (six) hours as needed. Or shortness of breath    . apixaban (ELIQUIS) 5 MG TABS tablet Take 1 tablet (5 mg  total) by mouth 2 (two) times daily. 60 tablet 3  . cholestyramine (QUESTRAN) 4 G packet TAKE 2 PACKETS DAILY 60 packet 10  . citalopram (CELEXA) 10 MG tablet Take 1 tablet (10 mg total) by mouth daily. 90 tablet 2  . clorazepate (TRANXENE) 7.5 MG tablet Take 1 tablet (7.5 mg total) by mouth at bedtime. 30 tablet 5  . dofetilide (TIKOSYN) 500 MCG capsule Take 1 capsule (500 mcg total) by mouth 2 (two) times daily. 14 capsule 0  . esomeprazole (NEXIUM) 40 MG capsule Take 40 mg by mouth daily at 12 noon.    Marland Kitchen guaiFENesin 200 MG tablet Take 200 mg by mouth 2 (two) times daily as needed (for cold).    Marland Kitchen ibuprofen (ADVIL,MOTRIN) 200 MG tablet Take 400 mg by mouth every 6 (six) hours as needed. For pain    . losartan (COZAAR) 50 MG tablet Take 1 tablet (50 mg total) by mouth daily. 90 tablet 3  . metoprolol succinate (TOPROL-XL) 50 MG 24 hr tablet TAKE 1 TABLET EVERY DAY 90 tablet 3  . simvastatin (ZOCOR) 20 MG tablet Take 1 tablet (20 mg total) by mouth daily at 6 PM. 30 tablet 11   No current facility-administered medications for this visit.   Allergies  Allergen Reactions  . Flecainide Other (See Comments)    EXTREME SOB  . Multaq [Dronedarone] Shortness Of Breath  . Penicillins Hives

## 2014-08-02 NOTE — Assessment & Plan Note (Signed)
Pt's QTc and labs stable 1 week post Tikosyn start.  He has had more episodes of atrial fibrillation.  Will relay information to [name] for review.  Instructed pt to call us if they get any worse over the next few days.  He has scheduled follow up with Roderic Palau in January and [name] in March.

## 2014-08-05 ENCOUNTER — Telehealth: Payer: Self-pay | Admitting: Internal Medicine

## 2014-08-05 NOTE — Telephone Encounter (Signed)
New Msg    Pt states he was instructed to call to give info about well being.    Pt states he went into a-fib one time and lasted for four hrs and is otherwise feeling fine.

## 2014-08-05 NOTE — Telephone Encounter (Signed)
Spoke with pt.  He only had 1 episode of afib on Tuesday this week and is feeling much better this week.  He will continue his current dose of Tikosyn and follow up with Roderic Palau, NP in January.

## 2014-08-09 ENCOUNTER — Telehealth: Payer: Self-pay | Admitting: Internal Medicine

## 2014-08-09 NOTE — Telephone Encounter (Signed)
Discussed with Harold Scott, Pharm D.  Ok to take Mucinex and Robitussin.  Patient aware

## 2014-08-09 NOTE — Telephone Encounter (Signed)
New msg     Pt takes tikosyn and wants to know if there are any meds he can take for a cold.

## 2014-08-18 ENCOUNTER — Encounter: Payer: Self-pay | Admitting: Nurse Practitioner

## 2014-08-18 ENCOUNTER — Ambulatory Visit (INDEPENDENT_AMBULATORY_CARE_PROVIDER_SITE_OTHER): Payer: PRIVATE HEALTH INSURANCE | Admitting: Nurse Practitioner

## 2014-08-18 ENCOUNTER — Telehealth: Payer: Self-pay | Admitting: Internal Medicine

## 2014-08-18 VITALS — BP 114/70 | HR 90 | Ht 68.0 in | Wt 276.5 lb

## 2014-08-18 DIAGNOSIS — I481 Persistent atrial fibrillation: Secondary | ICD-10-CM

## 2014-08-18 DIAGNOSIS — I480 Paroxysmal atrial fibrillation: Secondary | ICD-10-CM

## 2014-08-18 DIAGNOSIS — I4819 Other persistent atrial fibrillation: Secondary | ICD-10-CM

## 2014-08-18 MED ORDER — METOPROLOL SUCCINATE ER 50 MG PO TB24: ORAL_TABLET | ORAL | Status: DC

## 2014-08-18 NOTE — Progress Notes (Addendum)
Cardiologist-Dr. Cecile Sheerer EP-[name] PCP:  [name], MD              HPI  Harold Scott is a 53 y.o. male with a past medical history significant for paroxysmal atrial fibrillation, obesity, sleep apnea (on CPAP), and GERD. He has previously been unable to tolerate medical therapy with Multaq and Flecainide due to shortness of breath. He was last seen by Dr [name] 10/2013 at which time his AF burden had improved with use of CPAP and he did not wish to pursue further treatment options at that time. He presented 07/19/2014 to [facility] with recurrent atrial fibrillation, dyspnea, and epigastric pain. Myoview was performed and was low-risk. He spontaneously converted to SR. Due to increasing symptomatic atrial fibrillation, Tikosyn loading was recommended. Renal function and electrolytes were followed during the hospitalization. The QTc remained stable (473 msec on discharge). He was monitored until discharge on telemetry which demonstrated sinus rhythm.   Pt states he has had more episodes of atrial fibrillation since starting Tikosyn than before.  Episodes last 4-10 hours.   Prior to hospitalization, he would have episodes about once a week.  He asked to be seen today due to afib lasting greater than 24 hours.  Since discharge, pt reports not missing any doses.Also compliant compliant with NOAC.  He has had no problems getting his prescription from the pharmacy.  Reiterated the importance of compliance and drug interactions with Tikosyn.    He has been compliant with CPAP and is trying very hard to change diet for weight loss. He is avoiding alcohol,salt and caffeine. He is very frustrated re high afib burden with tikosyn. He reports [name] mentioned possible ablation if tikosyn was not successful although would probably be a difficult case due to left atrial size. He is symptomatic with fatigue, diaphoresis and dyspnea while in afib.  EKG shows afib rate controlled at 90 bpm with QTc  measured by Dr. Lovena Le at 454 ms.  He denies any change in medication.. Had a recent stress test which was low risk. Mentions he does wake up in afib a lot and often has mid sternum discomfort that will resolve with eating or drinking with h/o gerd and does take PPI at night.  Today, he denies symptoms of  exertional chest pain, shortness of breath only with afib, no orthopnea, PND, lower extremity edema, dizziness, presyncope, syncope, or neurologic sequela.  The patient feels that he is tolerating medications without difficulties. Other ROS per above.  Past Medical History  Diagnosis Date  . Anxiety   . Depression   . GERD (gastroesophageal reflux disease)   . Gallstones   . Asthma   . PONV (postoperative nausea and vomiting)   . Paroxysmal atrial fibrillation     a.  previously intolerant of Flecainide and Multaq  . Obstructive sleep apnea     a. on CPAP, followed by [facility]   Past Surgical History  Procedure Laterality Date  . Hand surgery    . Tonsillectomy    . Wisdom tooth extraction    . Cholecystectomy  08/05/2012    Procedure: LAPAROSCOPIC CHOLECYSTECTOMY WITH INTRAOPERATIVE CHOLANGIOGRAM;  Surgeon: [name], MD,FACS;  Location: [facility];  Service: General;  Laterality: N/A;  . Laparoscopic appendectomy N/A 02/13/2014    Procedure: APPENDECTOMY LAPAROSCOPIC;  Surgeon: [name], MD;  Location: [facility];  Service: General;  Laterality: N/A;    Current Outpatient Prescriptions  Medication Sig Dispense Refill  . apixaban (ELIQUIS) 5  MG TABS tablet Take 1 tablet (5 mg total) by mouth 2 (two) times daily. 60 tablet 3  . cholestyramine (QUESTRAN) 4 G packet TAKE 2 PACKETS DAILY (Patient taking differently: TAKE 2 PACKETS BY MOUTH DAILY) 60 packet 10  . citalopram (CELEXA) 10 MG tablet Take 1 tablet (10 mg total) by mouth daily. 90 tablet 2  . clorazepate (TRANXENE) 7.5 MG tablet Take 1 tablet (7.5 mg total) by mouth at bedtime. 30 tablet 5  . dofetilide (TIKOSYN) 500 MCG  capsule Take 1 capsule (500 mcg total) by mouth 2 (two) times daily. 14 capsule 0  . esomeprazole (NEXIUM) 40 MG capsule Take 40 mg by mouth daily at 12 noon.    Marland Kitchen guaiFENesin 200 MG tablet Take 200 mg by mouth 2 (two) times daily as needed (for cold).    Marland Kitchen ibuprofen (ADVIL,MOTRIN) 200 MG tablet Take 400 mg by mouth every 6 (six) hours as needed. For pain    . losartan (COZAAR) 50 MG tablet Take 1 tablet (50 mg total) by mouth daily. 90 tablet 3  . metoprolol succinate (TOPROL-XL) 50 MG 24 hr tablet Take 50mg  (1 pill) by mouth every morning and 25mg  (1/2 pill) at dinner 135 tablet 3  . simvastatin (ZOCOR) 20 MG tablet Take 1 tablet (20 mg total) by mouth daily at 6 PM. 30 tablet 11   No current facility-administered medications for this visit.    Allergies  Allergen Reactions  . Flecainide Other (See Comments)    EXTREME SOB  . Multaq [Dronedarone] Shortness Of Breath  . Penicillins Hives    History   Social History  . Marital Status: Married    Spouse Name: N/A    Number of Children: 1  . Years of Education: N/A   Occupational History  . Courier    Social History Main Topics  . Smoking status: Former Smoker -- 0.50 packs/day for 30 years    Types: Cigarettes    Quit date: 07/23/2006  . Smokeless tobacco: Never Used  . Alcohol Use: 4.2 - 8.4 oz/week    7-14 Not specified per week     Comment: social  . Drug Use: No  . Sexual Activity: Not on file   Other Topics Concern  . Not on file   Social History Narrative   Pt works as a Secondary school teacher    Family History  Problem Relation Age of Onset  . Adopted: Yes    ROS-  All systems are reviewed and are negative except as outlined in the HPI above  Physical Exam: Filed Vitals:   08/18/14 1553  BP: 114/70  Pulse: 90  Height: 5\' 8"  (1.727 m)  Weight: 276 lb 8 oz (125.42 kg)  SpO2: 98%    GEN- The patient is overweight appearing, alert and oriented x 3 today.   Head- normocephalic, atraumatic Eyes-  Sclera clear,  conjunctiva pink Ears- hearing intact Oropharynx- clear Neck- supple, no JVP Lymph- no cervical lymphadenopathy Lungs- Clear to ausculation bilaterally, normal work of breathing Heart- Irregular rate and rhythm, no murmurs, rubs or gallops, PMI not laterally displaced GI- soft, NT, ND, + BS Extremities- no clubbing, cyanosis, or edema MS- no significant deformity or atrophy Skin- no rash or lesion Psych- euthymic mood, full affect Neuro- strength and sensation are intact  EKG today afib at 90 bpm, with QTC measured by Dr. Lovena Le at  454 ms. Epic records reviewed   Assessment and Plan: 1.  Persistent symptomatic Atrial fibrillation High afib burden despite tikosyn  use. Try adding 25 mg of Toprol XL  In the pm since pt will often wake up in afib. Bmet/Mg today If persists in afib consider DCCV Discuss with [name] pursuing  Ablation. Continue eliquis  2. OSA Pt is using cpap consistently.  3. Obesity Weight loss is advised. He was encouraged to continue his weight efforts.  I will talk to him on Monday after I discuss with [name] re next step in plan.

## 2014-08-18 NOTE — Telephone Encounter (Signed)
Spoke with patient.  He feels he is having a lot of break through afib.  He will come in today and see Roderic Palau, NP at 3:45

## 2014-08-18 NOTE — Patient Instructions (Signed)
Your physician recommends that you schedule a follow-up appointment as scheduled for  Tues   Your physician recommends that you return for lab work today BMP/MG  Your physician has recommended you make the following change in your medication:  1) Increase Metoprolol to 50mg  in the am and 25mg  in the pm

## 2014-08-18 NOTE — Telephone Encounter (Signed)
New message     Pt is on tikosyn.  He is still in AFIB.  He does not think his Harold Scott is working.  Please call

## 2014-08-19 LAB — MAGNESIUM: MAGNESIUM: 1.7 mg/dL (ref 1.5–2.5)

## 2014-08-19 LAB — BASIC METABOLIC PANEL
BUN: 10 mg/dL (ref 6–23)
CHLORIDE: 102 meq/L (ref 96–112)
CO2: 26 mEq/L (ref 19–32)
Calcium: 9.2 mg/dL (ref 8.4–10.5)
Creatinine, Ser: 0.9 mg/dL (ref 0.4–1.5)
GFR: 96.33 mL/min (ref >60.00)
Glucose, Bld: 101 mg/dL — ABNORMAL HIGH (ref 70–99)
POTASSIUM: 4 meq/L (ref 3.5–5.1)
SODIUM: 135 meq/L (ref 135–145)

## 2014-08-24 ENCOUNTER — Ambulatory Visit (HOSPITAL_COMMUNITY): Payer: PRIVATE HEALTH INSURANCE | Admitting: Nurse Practitioner

## 2014-08-25 ENCOUNTER — Telehealth: Payer: Self-pay | Admitting: Internal Medicine

## 2014-08-25 NOTE — Telephone Encounter (Signed)
Melissa made him an appointment on 09/02/14

## 2014-08-25 NOTE — Telephone Encounter (Signed)
New message     Calling to check on the status of scheduling an ablation

## 2014-09-02 ENCOUNTER — Ambulatory Visit (INDEPENDENT_AMBULATORY_CARE_PROVIDER_SITE_OTHER): Payer: 59 | Admitting: Internal Medicine

## 2014-09-02 ENCOUNTER — Encounter: Payer: Self-pay | Admitting: *Deleted

## 2014-09-02 ENCOUNTER — Encounter: Payer: Self-pay | Admitting: Internal Medicine

## 2014-09-02 VITALS — BP 132/88 | HR 55 | Ht 68.0 in | Wt 272.0 lb

## 2014-09-02 DIAGNOSIS — G4733 Obstructive sleep apnea (adult) (pediatric): Secondary | ICD-10-CM | POA: Diagnosis not present

## 2014-09-02 DIAGNOSIS — I481 Persistent atrial fibrillation: Secondary | ICD-10-CM | POA: Diagnosis not present

## 2014-09-02 DIAGNOSIS — I4819 Other persistent atrial fibrillation: Secondary | ICD-10-CM

## 2014-09-02 DIAGNOSIS — E669 Obesity, unspecified: Secondary | ICD-10-CM

## 2014-09-02 DIAGNOSIS — I10 Essential (primary) hypertension: Secondary | ICD-10-CM

## 2014-09-02 NOTE — Progress Notes (Signed)
Electrophysiology Office Note   Date:  09/02/2014   ID:  [name], DOB 1961/06/01, MRN [id number]  PCP:  [name], MD   Primary Electrophysiologist: Story Vanvranken   Chief Complaint  Patient presents with  . Atrial Fibrillation     History of Present Illness: PROSPER PAFF is a 54 y.o. male who presents today for electrophysiology evaluation.   His afib continues to increase in both frequency and duration.  He finds this disturbing with symptoms of SOB, palpitations, and fatigue.Marland Kitchen  He is unaware of triggers/ precipitants.  He reports compliance with tikosyn.  He has lost 7 lbs and continues to work on [employer] recuction.  He is compliant with CPAP.   Today, he denies symptoms of  chest pain, orthopnea, PND, lower extremity edema, claudication, dizziness, presyncope, syncope, bleeding, or neurologic sequela. The patient is tolerating medications without difficulties and is otherwise without complaint today.    Past Medical History  Diagnosis Date  . Anxiety   . Depression   . GERD (gastroesophageal reflux disease)   . Gallstones   . Asthma   . PONV (postoperative nausea and vomiting)   . Paroxysmal atrial fibrillation     a.  previously intolerant of Flecainide and Multaq  . Obstructive sleep apnea     a. on CPAP, followed by [facility]   Past Surgical History  Procedure Laterality Date  . Hand surgery    . Tonsillectomy    . Wisdom tooth extraction    . Cholecystectomy  08/05/2012    Procedure: LAPAROSCOPIC CHOLECYSTECTOMY WITH INTRAOPERATIVE CHOLANGIOGRAM;  Surgeon: [name], MD,FACS;  Location: [facility];  Service: General;  Laterality: N/A;  . Laparoscopic appendectomy N/A 02/13/2014    Procedure: APPENDECTOMY LAPAROSCOPIC;  Surgeon: [name], MD;  Location: [facility];  Service: General;  Laterality: N/A;     Current Outpatient Prescriptions  Medication Sig Dispense Refill  . apixaban (ELIQUIS) 5 MG TABS tablet Take 1 tablet (5 mg total) by mouth 2 (two)  times daily. 60 tablet 3  . cholestyramine (QUESTRAN) 4 G packet TAKE 2 PACKETS DAILY (Patient taking differently: TAKE 2 PACKETS BY MOUTH DAILY) 60 packet 10  . citalopram (CELEXA) 10 MG tablet Take 1 tablet (10 mg total) by mouth daily. 90 tablet 2  . clorazepate (TRANXENE) 7.5 MG tablet Take 1 tablet (7.5 mg total) by mouth at bedtime. 30 tablet 5  . dofetilide (TIKOSYN) 500 MCG capsule Take 1 capsule (500 mcg total) by mouth 2 (two) times daily. 14 capsule 0  . esomeprazole (NEXIUM) 40 MG capsule Take 40 mg by mouth daily at 12 noon.    Marland Kitchen guaiFENesin 200 MG tablet Take 200 mg by mouth 2 (two) times daily as needed (for cold).    Marland Kitchen ibuprofen (ADVIL,MOTRIN) 200 MG tablet Take 400 mg by mouth every 6 (six) hours as needed. For pain    . losartan (COZAAR) 50 MG tablet Take 1 tablet (50 mg total) by mouth daily. 90 tablet 3  . metoprolol succinate (TOPROL-XL) 50 MG 24 hr tablet Take 50mg  (1 pill) by mouth every morning and 25mg  (1/2 pill) at dinner (Patient taking differently: 75 mg. Take 50mg  (1 pill) by mouth every morning and 25mg  (1/2 pill) at dinner) 135 tablet 3  . simvastatin (ZOCOR) 20 MG tablet Take 1 tablet (20 mg total) by mouth daily at 6 PM. 30 tablet 11   No current facility-administered medications for this visit.    Allergies:   Flecainide; Multaq;  and Penicillins   Social History:  The patient  reports that he quit smoking about 8 years ago. His smoking use included Cigarettes. He has a 15 pack-year smoking history. He has never used smokeless tobacco. He reports that he drinks about 4.2 - 8.4 oz of alcohol per week. He reports that he does not use illicit drugs.   Family History:  Adopted and does not know FH    ROS:  Please see the history of present illness.     All other systems are reviewed and negative.    PHYSICAL EXAM: VS:  BP 132/88 mmHg  Pulse 55  Ht 5\' 8"  (1.727 m)  Wt 272 lb (123.378 kg)  BMI 41.37 kg/m2 , BMI Body mass index is 41.37 kg/(m^2). GEN:  overweight, in no acute distress HEENT: normal Neck: no JVD, carotid bruits, or masses Cardiac: RRR; no murmurs, rubs, or gallops,no edema  Respiratory:  clear to auscultation bilaterally, normal work of breathing GI: soft, nontender, nondistended, + BS MS: no deformity or atrophy Skin: warm and dry,  Neuro:  Strength and sensation are intact Psych: euthymic mood, full affect  EKG:  EKG is ordered today. The ekg ordered today shows sinus rhythm 55 bpm Qtc 461   Recent Labs: 07/18/2014: Pro B Natriuretic peptide (BNP) 1325.0* 07/19/2014: ALT 43 07/20/2014: Hemoglobin 14.8; Platelets 198; TSH 5.500* 08/18/2014: BUN 10; Creatinine 0.9; Magnesium 1.7; Potassium 4.0; Sodium 135    Lipid Panel     Component Value Date/Time   CHOL 228* 07/19/2014 0425   TRIG 338* 07/19/2014 0425   HDL 30* 07/19/2014 0425   CHOLHDL 7.6 07/19/2014 0425   VLDL 68* 07/19/2014 0425   LDLCALC 130* 07/19/2014 0425     Wt Readings from Last 3 Encounters:  09/02/14 272 lb (123.378 kg)  08/18/14 276 lb 8 oz (125.42 kg)  07/23/14 277 lb 1.6 oz (125.692 kg)     Other studies Reviewed: Additional studies/ records that were reviewed today include: Roderic Palau NPs recent note and echo 11/15  Review of the above records today demonstrates: normal EF, LA size 56 mm, no MR   ASSESSMENT AND PLAN:  1.  Paroxysmal atrial fibrillation Unfortunately his afib continues to worsen.  He does not tolerate this well.  He has failed medical therapy. Therapeutic strategies for afib including medicine and ablation were discussed in detail with the patient today. Risk, benefits, and alternatives to EP study and radiofrequency ablation for afib were also discussed in detail today. These risks include but are not limited to stroke, bleeding, vascular damage, tamponade, perforation, damage to the esophagus, lungs, and other structures, pulmonary vein stenosis, worsening renal function, and death. The patient understands these  risk and wishes to proceed.  We will therefore proceed with catheter ablation at the next available time.  He will have a TEE prior to the procedure to exclude LA thrombus. chads2vasc is 0.  He is on eliquis and will need to remain on this 3 months post ablation.  2. OSA Compliance with CPAP is encouraged  3. Overweight. I have reviewed the patients BMI and decreased success rates with ablation at length today.  Weight loss is strongly advised.  Per Guijian et al (PACE 2013; 36: 376-283), patients with BMI 25-29.9 (obese) have a 27% increase in AF recurrence post ablation.  Patients with BMI >30 have a 31% increase in AF recurrence post ablation when compared to those with BMI <25.   Current medicines are reviewed at length with the patient  today.   The patient is does not have concerns regarding his medicines.      Army Fossa, MD  09/02/2014 3:33 PM      [address] [address] [phone] (office) [phone] (fax)

## 2014-09-02 NOTE — Patient Instructions (Signed)
Your physician has recommended that you have an ablation. Catheter ablation is a medical procedure used to treat some cardiac arrhythmias (irregular heartbeats). During catheter ablation, a long, thin, flexible tube is put into a blood vessel in your groin (upper thigh), or neck. This tube is called an ablation catheter. It is then guided to your heart through the blood vessel. Radio frequency waves destroy small areas of heart tissue where abnormal heartbeats may cause an arrhythmia to start. Please see the instruction sheet given to you today.  See instruction sheet for procedure  Cardiac Ablation Cardiac ablation is a procedure to disable a small amount of heart tissue in very specific places. The heart has many electrical connections. Sometimes these connections are abnormal and can cause the heart to beat very fast or irregularly. By disabling some of the problem areas, heart rhythm can be improved or made normal. Ablation is done for people who:   Have Wolff-Parkinson-White syndrome.   Have other fast heart rhythms (tachycardia).   Have taken medicines for an abnormal heart rhythm (arrhythmia) that resulted in:   No success.   Side effects.   May have a high-risk heartbeat that could result in death.  LET YOUR HEALTH CARE PROVIDER KNOW ABOUT:   Any allergies you have or any previous reactions you have had to X-ray dye, food (such as seafood), medicine, or tape.   All medicines you are taking, including vitamins, herbs, eye drops, creams, and over-the-counter medicines.   Previous problems you or members of your family have had with the use of anesthetics.   Any blood disorders you have.   Previous surgeries or procedures (such as a kidney transplant) you have had.   Medical conditions you have (such as kidney failure).  RISKS AND COMPLICATIONS Generally, cardiac ablation is a safe procedure. However, problems can occur and include:   Increased risk of cancer.  Depending on how long it takes to do the ablation, the dose of radiation can be high.  Bruising and bleeding where a thin, flexible tube (catheter) was inserted during the procedure.   Bleeding into the chest, especially into the sac that surrounds the heart (serious).  Need for a permanent pacemaker if the normal electrical system is damaged.   The procedure may not be fully effective, and this may not be recognized for months. Repeat ablation procedures are sometimes required. BEFORE THE PROCEDURE   Follow any instructions from your health care provider regarding eating and drinking before the procedure.   Take your medicines as directed at regular times with water, unless instructed otherwise by your health care provider. If you are taking diabetes medicine, including insulin, ask how you are to take it and if there are any special instructions you should follow. It is common to adjust insulin dosing the day of the ablation.  PROCEDURE  An ablation is usually performed in a catheterization laboratory with the guidance of fluoroscopy. Fluoroscopy is a type of X-ray that helps your health care provider see images of your heart during the procedure.   An ablation is a minimally invasive procedure. This means a small cut (incision) is made in either your neck or groin. Your health care provider will decide where to make the incision based on your medical history and physical exam.  An IV tube will be started before the procedure begins. You will be given an anesthetic or medicine to help you relax (sedative).  The skin on your neck or groin will be numbed. A needle   will be inserted into a large vein in your neck or groin and catheters will be threaded to your heart.  A special dye that shows up on fluoroscopy pictures may be injected through the catheter. The dye helps your health care provider see the area of the heart that needs treatment.  The catheter has electrodes on the tip.  When the area of heart tissue that is causing the arrhythmia is found, the catheter tip will send an electrical current to the area and "scar" the tissue. Three types of energy can be used to ablate the heart tissue:   Heat (radiofrequency energy).   Laser energy.   Extreme cold (cryoablation).   When the area of the heart has been ablated, the catheter will be taken out. Pressure will be held on the insertion site. This will help the insertion site clot and keep it from bleeding. A bandage will be placed on the insertion site.  AFTER THE PROCEDURE   After the procedure, you will be taken to a recovery area where your vital signs (blood pressure, heart rate, and breathing) will be monitored. The insertion site will also be monitored for bleeding.   You will need to lie still for 4-6 hours. This is to ensure you do not bleed from the catheter insertion site.  Document Released: 12/23/2008 Document Revised: 12/21/2013 Document Reviewed: 12/29/2012 ExitCare Patient Information 2015 [employer]. This information is not intended to replace advice given to you by your health care provider. Make sure you discuss any questions you have with your health care provider.  

## 2014-09-14 ENCOUNTER — Other Ambulatory Visit (INDEPENDENT_AMBULATORY_CARE_PROVIDER_SITE_OTHER): Payer: 59 | Admitting: *Deleted

## 2014-09-14 DIAGNOSIS — I4819 Other persistent atrial fibrillation: Secondary | ICD-10-CM

## 2014-09-14 DIAGNOSIS — I10 Essential (primary) hypertension: Secondary | ICD-10-CM

## 2014-09-14 DIAGNOSIS — I481 Persistent atrial fibrillation: Secondary | ICD-10-CM | POA: Diagnosis not present

## 2014-09-14 DIAGNOSIS — E669 Obesity, unspecified: Secondary | ICD-10-CM

## 2014-09-14 DIAGNOSIS — G4733 Obstructive sleep apnea (adult) (pediatric): Secondary | ICD-10-CM | POA: Diagnosis not present

## 2014-09-14 LAB — CBC WITH DIFFERENTIAL/PLATELET
BASOS PCT: 0.2 % (ref 0.0–3.0)
Basophils Absolute: 0 10*3/uL (ref 0.0–0.1)
EOS ABS: 0.1 10*3/uL (ref 0.0–0.7)
EOS PCT: 1.4 % (ref 0.0–5.0)
HEMATOCRIT: 39.4 % (ref 39.0–52.0)
Hemoglobin: 13.6 g/dL (ref 13.0–17.0)
LYMPHS ABS: 2.5 10*3/uL (ref 0.7–4.0)
LYMPHS PCT: 22.6 % (ref 12.0–46.0)
MCHC: 34.4 g/dL (ref 30.0–36.0)
MCV: 89.5 fl (ref 78.0–100.0)
MONOS PCT: 9.2 % (ref 3.0–12.0)
Monocytes Absolute: 1 10*3/uL (ref 0.1–1.0)
NEUTROS ABS: 7.2 10*3/uL (ref 1.4–7.7)
NEUTROS PCT: 66.6 % (ref 43.0–77.0)
PLATELETS: 252 10*3/uL (ref 150.0–400.0)
RBC: 4.41 Mil/uL (ref 4.22–5.81)
RDW: 14.1 % (ref 11.5–15.5)
WBC: 10.8 10*3/uL — AB (ref 4.0–10.5)

## 2014-09-15 ENCOUNTER — Encounter (HOSPITAL_COMMUNITY): Payer: Self-pay | Admitting: Pharmacy Technician

## 2014-09-15 ENCOUNTER — Other Ambulatory Visit: Payer: PRIVATE HEALTH INSURANCE

## 2014-09-15 LAB — BASIC METABOLIC PANEL
BUN: 11 mg/dL (ref 6–23)
CALCIUM: 9 mg/dL (ref 8.4–10.5)
CO2: 26 meq/L (ref 19–32)
Chloride: 100 mEq/L (ref 96–112)
Creatinine, Ser: 0.89 mg/dL (ref 0.40–1.50)
GFR: 95.06 mL/min (ref >60.00)
Glucose, Bld: 127 mg/dL — ABNORMAL HIGH (ref 70–99)
Potassium: 3.9 mEq/L (ref 3.5–5.1)
SODIUM: 135 meq/L (ref 135–145)

## 2014-09-20 ENCOUNTER — Encounter (HOSPITAL_COMMUNITY)
Admission: RE | Disposition: A | Discharge status: Home / Self Care | Payer: Self-pay | Source: Ambulatory Visit | Attending: Internal Medicine

## 2014-09-20 ENCOUNTER — Encounter (HOSPITAL_COMMUNITY): Payer: Self-pay | Admitting: *Deleted

## 2014-09-20 ENCOUNTER — Ambulatory Visit (HOSPITAL_COMMUNITY)
Admission: RE | Admit: 2014-09-20 | Discharge: 2014-09-20 | Disposition: A | Discharge status: Home / Self Care | Payer: 59 | Source: Ambulatory Visit | Attending: Internal Medicine | Admitting: Internal Medicine

## 2014-09-20 DIAGNOSIS — I4891 Unspecified atrial fibrillation: Secondary | ICD-10-CM | POA: Diagnosis not present

## 2014-09-20 DIAGNOSIS — K219 Gastro-esophageal reflux disease without esophagitis: Secondary | ICD-10-CM | POA: Insufficient documentation

## 2014-09-20 DIAGNOSIS — Z6841 Body Mass Index (BMI) 40.0 and over, adult: Secondary | ICD-10-CM | POA: Insufficient documentation

## 2014-09-20 DIAGNOSIS — F329 Major depressive disorder, single episode, unspecified: Secondary | ICD-10-CM | POA: Insufficient documentation

## 2014-09-20 DIAGNOSIS — Z888 Allergy status to other drugs, medicaments and biological substances status: Secondary | ICD-10-CM | POA: Insufficient documentation

## 2014-09-20 DIAGNOSIS — E669 Obesity, unspecified: Secondary | ICD-10-CM | POA: Diagnosis not present

## 2014-09-20 DIAGNOSIS — J45909 Unspecified asthma, uncomplicated: Secondary | ICD-10-CM | POA: Diagnosis not present

## 2014-09-20 DIAGNOSIS — Z87891 Personal history of nicotine dependence: Secondary | ICD-10-CM | POA: Insufficient documentation

## 2014-09-20 DIAGNOSIS — F419 Anxiety disorder, unspecified: Secondary | ICD-10-CM | POA: Insufficient documentation

## 2014-09-20 DIAGNOSIS — G4733 Obstructive sleep apnea (adult) (pediatric): Secondary | ICD-10-CM

## 2014-09-20 DIAGNOSIS — Z880 Allergy status to penicillin: Secondary | ICD-10-CM | POA: Diagnosis not present

## 2014-09-20 DIAGNOSIS — I340 Nonrheumatic mitral (valve) insufficiency: Secondary | ICD-10-CM | POA: Diagnosis not present

## 2014-09-20 DIAGNOSIS — I4819 Other persistent atrial fibrillation: Secondary | ICD-10-CM

## 2014-09-20 HISTORY — PX: TEE WITHOUT CARDIOVERSION: SHX5443

## 2014-09-20 SURGERY — ECHOCARDIOGRAM, TRANSESOPHAGEAL
Anesthesia: Moderate Sedation

## 2014-09-20 MED ORDER — FENTANYL CITRATE 0.05 MG/ML IJ SOLN
INTRAMUSCULAR | Status: DC | PRN
  Administered 2014-09-20 (×2): 25 ug via INTRAVENOUS
  Administered 2014-09-20: 50 ug via INTRAVENOUS

## 2014-09-20 MED ORDER — BUTAMBEN-TETRACAINE-BENZOCAINE 2-2-14 % EX AERO
INHALATION_SPRAY | CUTANEOUS | Status: DC | PRN
  Administered 2014-09-20: 2 via TOPICAL

## 2014-09-20 MED ORDER — DIPHENHYDRAMINE HCL 50 MG/ML IJ SOLN
INTRAMUSCULAR | Status: AC
  Filled 2014-09-20: qty 1

## 2014-09-20 MED ORDER — DIPHENHYDRAMINE HCL 50 MG/ML IJ SOLN
INTRAMUSCULAR | Status: DC | PRN
  Administered 2014-09-20: 25 mg via INTRAVENOUS

## 2014-09-20 MED ORDER — FENTANYL CITRATE 0.05 MG/ML IJ SOLN
INTRAMUSCULAR | Status: AC
  Filled 2014-09-20: qty 4

## 2014-09-20 MED ORDER — MIDAZOLAM HCL 5 MG/ML IJ SOLN
INTRAMUSCULAR | Status: AC
  Filled 2014-09-20: qty 2

## 2014-09-20 MED ORDER — MIDAZOLAM HCL 10 MG/2ML IJ SOLN
INTRAMUSCULAR | Status: DC | PRN
  Administered 2014-09-20 (×5): 2 mg via INTRAVENOUS

## 2014-09-20 MED ORDER — SODIUM CHLORIDE 0.9 % IV SOLN
INTRAVENOUS | Status: DC
  Administered 2014-09-20: 500 mL via INTRAVENOUS

## 2014-09-20 MED ORDER — MIDAZOLAM HCL 5 MG/ML IJ SOLN
INTRAMUSCULAR | Status: AC
  Filled 2014-09-20: qty 1

## 2014-09-20 NOTE — Progress Notes (Signed)
  Echocardiogram Echocardiogram Transesophageal has been performed.  Philipp Deputy 09/20/2014, 10:13 AM

## 2014-09-20 NOTE — CV Procedure (Signed)
    Transesophageal Echocardiogram Note  Harold Scott [id number] 1961/06/19  Procedure: Transesophageal Echocardiogram Indications: atrial fib, pre-ablation   Procedure Details Consent: Obtained Time Out: Verified patient identification, verified procedure, site/side was marked, verified correct patient position, special equipment/implants available, Radiology Safety Procedures followed,  medications/allergies/relevent history reviewed, required imaging and test results available.  Performed  Medications: Fentanyl: 100 mcg  Versed: 10 mg iv  Benadryl 25 mg iv   Left Ventrical:  asymmetric septal hypertrophy, no evidence of obstruction,  normal LV function   Mitral Valve:  Mild MR   Aortic Valve:  Normal AV, trace AIO  Tricuspid Valve: normal, trace TR  Pulmonic Valve: no significant PI   Left Atrium/ Left atrial appendage: extensive spontaneous contrast, no thrombi   Atrial septum: significant bowing from left to right with a left to right flow across his PFO  Aorta: normal    Complications: No apparent complications Patient did tolerate procedure well.   Harold Scott, Harold Scott., MD, [facility] 09/20/2014, 9:54 AM

## 2014-09-20 NOTE — Interval H&P Note (Signed)
History and Physical Interval Note:  09/20/2014 9:30 AM  Harold Scott  has presented today for surgery, with the diagnosis of afib  The various methods of treatment have been discussed with the patient and family. After consideration of risks, benefits and other options for treatment, the patient has consented to  Procedure(s): TRANSESOPHAGEAL ECHOCARDIOGRAM (TEE) (N/A) as a surgical intervention .  The patient's history has been reviewed, patient examined, no change in status, stable for surgery.  I have reviewed the patient's chart and labs.  Questions were answered to the patient's satisfaction.     Nahser, Wonda Cheng

## 2014-09-20 NOTE — Discharge Instructions (Signed)
Transesophageal Echocardiogram °Transesophageal echocardiography (TEE) is a special type of test that produces images of the heart by using sound waves (echocardiogram). This type of echocardiography can obtain better images of the heart than standard echocardiography. TEE is done by passing a flexible tube down the esophagus. The heart is located in front of the esophagus. Because the heart and esophagus are close to one another, your health care provider can take very clear, detailed pictures of the heart via ultrasound waves. °TEE may be done: °· If your health care provider needs more information based on standard echocardiography findings. °· If you had a stroke. This might have happened because a clot formed in your heart. TEE can visualize different areas of the heart and check for clots. °· To check valve anatomy and function. °· To check for infection on the inside of your heart (endocarditis). °· To evaluate the dividing wall (septum) of the heart and presence of a hole that did not close after birth (patent foramen ovale or atrial septal defect). °· To help diagnose a tear in the wall of the aorta (aortic dissection). °· During cardiac valve surgery. This allows the surgeon to assess the valve repair before closing the chest. °· During a variety of other cardiac procedures to guide positioning of catheters. °· Sometimes before a cardioversion, which is a shock to convert heart rhythm back to normal. °LET YOUR HEALTH CARE PROVIDER KNOW ABOUT:  °· Any allergies you have. °· All medicines you are taking, including vitamins, herbs, eye drops, creams, and over-the-counter medicines. °· Previous problems you or members of your family have had with the use of anesthetics. °· Any blood disorders you have. °· Previous surgeries you have had. °· Medical conditions you have. °· Swallowing difficulties. °· An esophageal obstruction. °RISKS AND COMPLICATIONS  °Generally, TEE is a safe procedure. However, as with any  procedure, complications can occur. Possible complications include an esophageal tear (rupture). °BEFORE THE PROCEDURE  °· Do not eat or drink for 6 hours before the procedure or as directed by your health care provider. °· Arrange for someone to drive you home after the procedure. Do not drive yourself home. During the procedure, you will be given medicines that can continue to make you feel drowsy and can impair your reflexes. °· An IV access tube will be started in the arm. °PROCEDURE  °· A medicine to help you relax (sedative) will be given through the IV access tube. °· A medicine may be sprayed or gargled to numb the back of the throat. °· Your blood pressure, heart rate, and breathing (vital signs) will be monitored during the procedure. °· The TEE probe is a long, flexible tube. The tip of the probe is placed into the back of the mouth, and you will be asked to swallow. This helps to pass the tip of the probe into the esophagus. Once the tip of the probe is in the correct area, your health care provider can take pictures of the heart. °· TEE is usually not a painful procedure. You may feel the probe press against the back of the throat. The probe does not enter the trachea and does not affect your breathing. °AFTER THE PROCEDURE  °· You will be in bed, resting, until you have fully returned to consciousness. °· When you first awaken, your throat may feel slightly sore and will probably still feel numb. This will improve slowly over time. °· You will not be allowed to eat or drink until it   is clear that the numbness has improved. °· Once you have been able to drink, urinate, and sit on the edge of the bed without feeling sick to your stomach (nausea) or dizzy, you may be cleared to go home. °· You should have a friend or family member with you for the next 24 hours after your procedure. °Document Released: 10/27/2002 Document Revised: 08/11/2013 Document Reviewed: 02/05/2013 °[employer]® Patient Information  ©2015 [employer]. This information is not intended to replace advice given to you by your health care provider. Make sure you discuss any questions you have with your health care provider. ° °

## 2014-09-20 NOTE — H&P (View-Only) (Signed)
Electrophysiology Office Note   Date:  09/02/2014   ID:  Harold Scott, DOB February 25, 1961, MRN [id number]  PCP:  [name], MD   Primary Electrophysiologist: Jaynie Hitch   Chief Complaint  Patient presents with  . Atrial Fibrillation     History of Present Illness: Harold Scott is a 54 y.o. male who presents today for electrophysiology evaluation.   His afib continues to increase in both frequency and duration.  He finds this disturbing with symptoms of SOB, palpitations, and fatigue.Marland Kitchen  He is unaware of triggers/ precipitants.  He reports compliance with tikosyn.  He has lost 7 lbs and continues to work on [employer] recuction.  He is compliant with CPAP.   Today, he denies symptoms of  chest pain, orthopnea, PND, lower extremity edema, claudication, dizziness, presyncope, syncope, bleeding, or neurologic sequela. The patient is tolerating medications without difficulties and is otherwise without complaint today.    Past Medical History  Diagnosis Date  . Anxiety   . Depression   . GERD (gastroesophageal reflux disease)   . Gallstones   . Asthma   . PONV (postoperative nausea and vomiting)   . Paroxysmal atrial fibrillation     a.  previously intolerant of Flecainide and Multaq  . Obstructive sleep apnea     a. on CPAP, followed by [facility]   Past Surgical History  Procedure Laterality Date  . Hand surgery    . Tonsillectomy    . Wisdom tooth extraction    . Cholecystectomy  08/05/2012    Procedure: LAPAROSCOPIC CHOLECYSTECTOMY WITH INTRAOPERATIVE CHOLANGIOGRAM;  Surgeon: [name], MD,FACS;  Location: [facility];  Service: General;  Laterality: N/A;  . Laparoscopic appendectomy N/A 02/13/2014    Procedure: APPENDECTOMY LAPAROSCOPIC;  Surgeon: [name], MD;  Location: [facility];  Service: General;  Laterality: N/A;     Current Outpatient Prescriptions  Medication Sig Dispense Refill  . apixaban (ELIQUIS) 5 MG TABS tablet Take 1 tablet (5 mg total) by mouth 2 (two)  times daily. 60 tablet 3  . cholestyramine (QUESTRAN) 4 G packet TAKE 2 PACKETS DAILY (Patient taking differently: TAKE 2 PACKETS BY MOUTH DAILY) 60 packet 10  . citalopram (CELEXA) 10 MG tablet Take 1 tablet (10 mg total) by mouth daily. 90 tablet 2  . clorazepate (TRANXENE) 7.5 MG tablet Take 1 tablet (7.5 mg total) by mouth at bedtime. 30 tablet 5  . dofetilide (TIKOSYN) 500 MCG capsule Take 1 capsule (500 mcg total) by mouth 2 (two) times daily. 14 capsule 0  . esomeprazole (NEXIUM) 40 MG capsule Take 40 mg by mouth daily at 12 noon.    Marland Kitchen guaiFENesin 200 MG tablet Take 200 mg by mouth 2 (two) times daily as needed (for cold).    Marland Kitchen ibuprofen (ADVIL,MOTRIN) 200 MG tablet Take 400 mg by mouth every 6 (six) hours as needed. For pain    . losartan (COZAAR) 50 MG tablet Take 1 tablet (50 mg total) by mouth daily. 90 tablet 3  . metoprolol succinate (TOPROL-XL) 50 MG 24 hr tablet Take 50mg  (1 pill) by mouth every morning and 25mg  (1/2 pill) at dinner (Patient taking differently: 75 mg. Take 50mg  (1 pill) by mouth every morning and 25mg  (1/2 pill) at dinner) 135 tablet 3  . simvastatin (ZOCOR) 20 MG tablet Take 1 tablet (20 mg total) by mouth daily at 6 PM. 30 tablet 11   No current facility-administered medications for this visit.    Allergies:   Flecainide; Multaq;  and Penicillins   Social History:  The patient  reports that he quit smoking about 8 years ago. His smoking use included Cigarettes. He has a 15 pack-year smoking history. He has never used smokeless tobacco. He reports that he drinks about 4.2 - 8.4 oz of alcohol per week. He reports that he does not use illicit drugs.   Family History:  Adopted and does not know FH    ROS:  Please see the history of present illness.     All other systems are reviewed and negative.    PHYSICAL EXAM: VS:  BP 132/88 mmHg  Pulse 55  Ht 5\' 8"  (1.727 m)  Wt 272 lb (123.378 kg)  BMI 41.37 kg/m2 , BMI Body mass index is 41.37 kg/(m^2). GEN:  overweight, in no acute distress HEENT: normal Neck: no JVD, carotid bruits, or masses Cardiac: RRR; no murmurs, rubs, or gallops,no edema  Respiratory:  clear to auscultation bilaterally, normal work of breathing GI: soft, nontender, nondistended, + BS MS: no deformity or atrophy Skin: warm and dry,  Neuro:  Strength and sensation are intact Psych: euthymic mood, full affect  EKG:  EKG is ordered today. The ekg ordered today shows sinus rhythm 55 bpm Qtc 461   Recent Labs: 07/18/2014: Pro B Natriuretic peptide (BNP) 1325.0* 07/19/2014: ALT 43 07/20/2014: Hemoglobin 14.8; Platelets 198; TSH 5.500* 08/18/2014: BUN 10; Creatinine 0.9; Magnesium 1.7; Potassium 4.0; Sodium 135    Lipid Panel     Component Value Date/Time   CHOL 228* 07/19/2014 0425   TRIG 338* 07/19/2014 0425   HDL 30* 07/19/2014 0425   CHOLHDL 7.6 07/19/2014 0425   VLDL 68* 07/19/2014 0425   LDLCALC 130* 07/19/2014 0425     Wt Readings from Last 3 Encounters:  09/02/14 272 lb (123.378 kg)  08/18/14 276 lb 8 oz (125.42 kg)  07/23/14 277 lb 1.6 oz (125.692 kg)     Other studies Reviewed: Additional studies/ records that were reviewed today include: Roderic Palau NPs recent note and echo 11/15  Review of the above records today demonstrates: normal EF, LA size 56 mm, no MR   ASSESSMENT AND PLAN:  1.  Paroxysmal atrial fibrillation Unfortunately his afib continues to worsen.  He does not tolerate this well.  He has failed medical therapy. Therapeutic strategies for afib including medicine and ablation were discussed in detail with the patient today. Risk, benefits, and alternatives to EP study and radiofrequency ablation for afib were also discussed in detail today. These risks include but are not limited to stroke, bleeding, vascular damage, tamponade, perforation, damage to the esophagus, lungs, and other structures, pulmonary vein stenosis, worsening renal function, and death. The patient understands these  risk and wishes to proceed.  We will therefore proceed with catheter ablation at the next available time.  He will have a TEE prior to the procedure to exclude LA thrombus. chads2vasc is 0.  He is on eliquis and will need to remain on this 3 months post ablation.  2. OSA Compliance with CPAP is encouraged  3. Overweight. I have reviewed the patients BMI and decreased success rates with ablation at length today.  Weight loss is strongly advised.  Per Guijian et al (PACE 2013; 36: 662-947), patients with BMI 25-29.9 (obese) have a 27% increase in AF recurrence post ablation.  Patients with BMI >30 have a 31% increase in AF recurrence post ablation when compared to those with BMI <25.   Current medicines are reviewed at length with the patient  today.   The patient is does not have concerns regarding his medicines.      Army Fossa, MD  09/02/2014 3:33 PM      [address] [place] [address] [phone] (office) [phone] (fax)

## 2014-09-21 ENCOUNTER — Ambulatory Visit (HOSPITAL_COMMUNITY): Payer: 59 | Admitting: Anesthesiology

## 2014-09-21 ENCOUNTER — Encounter (HOSPITAL_COMMUNITY)
Admission: RE | Disposition: A | Discharge status: Home / Self Care | Payer: Self-pay | Source: Ambulatory Visit | Attending: Internal Medicine

## 2014-09-21 ENCOUNTER — Ambulatory Visit (HOSPITAL_COMMUNITY)
Admission: RE | Admit: 2014-09-21 | Discharge: 2014-09-22 | Disposition: A | Discharge status: Home / Self Care | Payer: 59 | Source: Ambulatory Visit | Attending: Internal Medicine | Admitting: Internal Medicine

## 2014-09-21 ENCOUNTER — Encounter (HOSPITAL_COMMUNITY): Payer: Self-pay | Admitting: Certified Registered Nurse Anesthetist

## 2014-09-21 DIAGNOSIS — F329 Major depressive disorder, single episode, unspecified: Secondary | ICD-10-CM | POA: Insufficient documentation

## 2014-09-21 DIAGNOSIS — K219 Gastro-esophageal reflux disease without esophagitis: Secondary | ICD-10-CM | POA: Insufficient documentation

## 2014-09-21 DIAGNOSIS — E669 Obesity, unspecified: Secondary | ICD-10-CM | POA: Insufficient documentation

## 2014-09-21 DIAGNOSIS — J45909 Unspecified asthma, uncomplicated: Secondary | ICD-10-CM | POA: Insufficient documentation

## 2014-09-21 DIAGNOSIS — I4891 Unspecified atrial fibrillation: Secondary | ICD-10-CM | POA: Diagnosis present

## 2014-09-21 DIAGNOSIS — I480 Paroxysmal atrial fibrillation: Secondary | ICD-10-CM | POA: Insufficient documentation

## 2014-09-21 DIAGNOSIS — G4733 Obstructive sleep apnea (adult) (pediatric): Secondary | ICD-10-CM | POA: Diagnosis not present

## 2014-09-21 HISTORY — PX: ATRIAL FIBRILLATION ABLATION: SHX5456

## 2014-09-21 HISTORY — PX: ABLATION: SHX5711

## 2014-09-21 LAB — MRSA PCR SCREENING: MRSA by PCR: NEGATIVE

## 2014-09-21 LAB — POCT ACTIVATED CLOTTING TIME
ACTIVATED CLOTTING TIME: 227 s
ACTIVATED CLOTTING TIME: 239 s
ACTIVATED CLOTTING TIME: 276 s
Activated Clotting Time: 140 seconds

## 2014-09-21 SURGERY — ATRIAL FIBRILLATION ABLATION
Anesthesia: Monitor Anesthesia Care

## 2014-09-21 MED ORDER — SODIUM CHLORIDE 0.9 % IV SOLN: 250.0000 mL | INTRAVENOUS | Status: DC | PRN

## 2014-09-21 MED ORDER — PANTOPRAZOLE SODIUM 40 MG PO TBEC
40.0000 mg | DELAYED_RELEASE_TABLET | Freq: Every day | ORAL | Status: DC
  Administered 2014-09-21: 40 mg via ORAL
  Filled 2014-09-21 (×2): qty 1

## 2014-09-21 MED ORDER — CLORAZEPATE DIPOTASSIUM 3.75 MG PO TABS
7.5000 mg | ORAL_TABLET | Freq: Every day | ORAL | Status: DC
  Administered 2014-09-21: 7.5 mg via ORAL
  Filled 2014-09-21: qty 2

## 2014-09-21 MED ORDER — HEPARIN SODIUM (PORCINE) 1000 UNIT/ML IJ SOLN
INTRAMUSCULAR | Status: DC | PRN
  Administered 2014-09-21: 5000 [IU] via INTRAVENOUS
  Administered 2014-09-21: 4000 [IU] via INTRAVENOUS

## 2014-09-21 MED ORDER — APIXABAN 5 MG PO TABS
5.0000 mg | ORAL_TABLET | Freq: Two times a day (BID) | ORAL | Status: DC
  Administered 2014-09-21: 5 mg via ORAL
  Filled 2014-09-21 (×3): qty 1

## 2014-09-21 MED ORDER — MIDAZOLAM HCL 5 MG/5ML IJ SOLN
INTRAMUSCULAR | Status: DC | PRN
  Administered 2014-09-21: 2 mg via INTRAVENOUS

## 2014-09-21 MED ORDER — GLYCOPYRROLATE 0.2 MG/ML IJ SOLN
INTRAMUSCULAR | Status: DC | PRN
  Administered 2014-09-21: 0.6 mg via INTRAVENOUS

## 2014-09-21 MED ORDER — PROPOFOL 10 MG/ML IV BOLUS
INTRAVENOUS | Status: DC | PRN
  Administered 2014-09-21: 150 mg via INTRAVENOUS

## 2014-09-21 MED ORDER — HYDROCODONE-ACETAMINOPHEN 5-325 MG PO TABS
1.0000 | ORAL_TABLET | ORAL | Status: DC | PRN
  Administered 2014-09-21 (×2): 1 via ORAL
  Administered 2014-09-21: 2 via ORAL
  Filled 2014-09-21: qty 2
  Filled 2014-09-21: qty 1

## 2014-09-21 MED ORDER — DOBUTAMINE IN D5W 4-5 MG/ML-% IV SOLN
INTRAVENOUS | Status: AC
Start: 2014-09-21 — End: 2014-09-21
  Filled 2014-09-21: qty 250

## 2014-09-21 MED ORDER — LOSARTAN POTASSIUM 50 MG PO TABS
50.0000 mg | ORAL_TABLET | Freq: Every day | ORAL | Status: DC
  Filled 2014-09-21: qty 1

## 2014-09-21 MED ORDER — NEOSTIGMINE METHYLSULFATE 10 MG/10ML IV SOLN
INTRAVENOUS | Status: DC | PRN
  Administered 2014-09-21: 4 mg via INTRAVENOUS

## 2014-09-21 MED ORDER — SODIUM CHLORIDE 0.9 % IJ SOLN
3.0000 mL | Freq: Two times a day (BID) | INTRAMUSCULAR | Status: DC
  Administered 2014-09-21: 3 mL via INTRAVENOUS

## 2014-09-21 MED ORDER — LIDOCAINE HCL 4 % MT SOLN
OROMUCOSAL | Status: DC | PRN
  Administered 2014-09-21: 3 mL via TOPICAL

## 2014-09-21 MED ORDER — CETYLPYRIDINIUM CHLORIDE 0.05 % MT LIQD
7.0000 mL | Freq: Two times a day (BID) | OROMUCOSAL | Status: DC
  Administered 2014-09-21: 7 mL via OROMUCOSAL

## 2014-09-21 MED ORDER — LACTATED RINGERS IV SOLN
INTRAVENOUS | Status: DC | PRN
  Administered 2014-09-21: 07:00:00 via INTRAVENOUS

## 2014-09-21 MED ORDER — FENTANYL CITRATE 0.05 MG/ML IJ SOLN
INTRAMUSCULAR | Status: DC | PRN
  Administered 2014-09-21: 150 ug via INTRAVENOUS

## 2014-09-21 MED ORDER — DEXAMETHASONE SODIUM PHOSPHATE 4 MG/ML IJ SOLN
INTRAMUSCULAR | Status: DC | PRN
  Administered 2014-09-21: 4 mg via INTRAVENOUS

## 2014-09-21 MED ORDER — PROTAMINE SULFATE 10 MG/ML IV SOLN
INTRAVENOUS | Status: DC | PRN
  Administered 2014-09-21: 30 mg via INTRAVENOUS

## 2014-09-21 MED ORDER — HEPARIN SODIUM (PORCINE) 1000 UNIT/ML IJ SOLN
INTRAMUSCULAR | Status: AC
  Filled 2014-09-21: qty 1

## 2014-09-21 MED ORDER — BUPIVACAINE HCL (PF) 0.25 % IJ SOLN
INTRAMUSCULAR | Status: AC
  Filled 2014-09-21: qty 30

## 2014-09-21 MED ORDER — SODIUM CHLORIDE 0.9 % IJ SOLN: 3.0000 mL | INTRAMUSCULAR | Status: DC | PRN

## 2014-09-21 MED ORDER — DOBUTAMINE IN D5W 4-5 MG/ML-% IV SOLN
INTRAVENOUS | Status: DC | PRN
  Administered 2014-09-21: 10 ug/kg/min via INTRAVENOUS

## 2014-09-21 MED ORDER — DOFETILIDE 500 MCG PO CAPS
500.0000 ug | ORAL_CAPSULE | Freq: Two times a day (BID) | ORAL | Status: DC
  Filled 2014-09-21 (×2): qty 1

## 2014-09-21 MED ORDER — HYDROCODONE-ACETAMINOPHEN 5-325 MG PO TABS
ORAL_TABLET | ORAL | Status: AC
  Filled 2014-09-21: qty 1

## 2014-09-21 MED ORDER — ROCURONIUM BROMIDE 100 MG/10ML IV SOLN
INTRAVENOUS | Status: DC | PRN
  Administered 2014-09-21: 10 mg via INTRAVENOUS
  Administered 2014-09-21: 20 mg via INTRAVENOUS
  Administered 2014-09-21: 30 mg via INTRAVENOUS

## 2014-09-21 MED ORDER — SUCCINYLCHOLINE CHLORIDE 20 MG/ML IJ SOLN
INTRAMUSCULAR | Status: DC | PRN
  Administered 2014-09-21: 180 mg via INTRAVENOUS

## 2014-09-21 MED ORDER — ACETAMINOPHEN 325 MG PO TABS: 650.0000 mg | ORAL_TABLET | ORAL | Status: DC | PRN

## 2014-09-21 MED ORDER — ONDANSETRON HCL 4 MG/2ML IJ SOLN
INTRAMUSCULAR | Status: DC | PRN
  Administered 2014-09-21 (×2): 4 mg via INTRAVENOUS

## 2014-09-21 MED ORDER — ONDANSETRON HCL 4 MG/2ML IJ SOLN: 4.0000 mg | Freq: Four times a day (QID) | INTRAMUSCULAR | Status: DC | PRN

## 2014-09-21 MED ORDER — DOFETILIDE 500 MCG PO CAPS
500.0000 ug | ORAL_CAPSULE | Freq: Two times a day (BID) | ORAL | Status: DC
  Administered 2014-09-21 – 2014-09-22 (×2): 500 ug via ORAL
  Filled 2014-09-21 (×5): qty 1

## 2014-09-21 MED ORDER — SODIUM CHLORIDE 0.9 % IV SOLN: INTRAVENOUS | Status: DC

## 2014-09-21 MED ORDER — LIDOCAINE HCL (CARDIAC) 20 MG/ML IV SOLN
INTRAVENOUS | Status: DC | PRN
  Administered 2014-09-21: 50 mg via INTRAVENOUS

## 2014-09-21 MED ORDER — EPHEDRINE SULFATE 50 MG/ML IJ SOLN
INTRAMUSCULAR | Status: DC | PRN
  Administered 2014-09-21: 5 mg via INTRAVENOUS

## 2014-09-21 MED ORDER — CITALOPRAM HYDROBROMIDE 10 MG PO TABS
10.0000 mg | ORAL_TABLET | Freq: Every day | ORAL | Status: DC
  Administered 2014-09-21: 10 mg via ORAL
  Filled 2014-09-21 (×3): qty 1

## 2014-09-21 NOTE — Anesthesia Preprocedure Evaluation (Addendum)
Anesthesia Evaluation  Patient identified by MRN, date of birth, ID band Patient awake    Reviewed: Allergy & Precautions, NPO status   History of Anesthesia Complications (+) PONV and history of anesthetic complications  Airway        Dental   Pulmonary asthma , sleep apnea and Continuous Positive Airway Pressure Ventilation , former smoker,          Cardiovascular hypertension, Pt. on medications and Pt. on home beta blockers +CHF  Study Conclusions  - Left ventricle: There was focal basal hypertrophy. Systolic function was normal. The estimated ejection fraction was in the range of 50% to 55%. - Aortic valve: No evidence of vegetation. - Mitral valve: There was mild regurgitation. - Left atrium: No evidence of thrombus in the atrial cavity or appendage. There was spontaneous echo contrast ("smoke"). - Atrial septum: The septum bowed from left to right, consistent with increased left atrial pressure. There was a small patent foramen ovale.    Neuro/Psych PSYCHIATRIC DISORDERS Anxiety Depression negative neurological ROS     GI/Hepatic Neg liver ROS, GERD-  Controlled,  Endo/Other  negative endocrine ROS  Renal/GU negative Renal ROS     Musculoskeletal   Abdominal   Peds  Hematology negative hematology ROS (+)   Anesthesia Other Findings   Reproductive/Obstetrics                            Anesthesia Physical Anesthesia Plan  ASA: III  Anesthesia Plan: MAC and General   Post-op Pain Management:    Induction: Intravenous  Airway Management Planned: LMA  Additional Equipment:   Intra-op Plan:   Post-operative Plan: Extubation in OR  Informed Consent: I have reviewed the patients History and Physical, chart, labs and discussed the procedure including the risks, benefits and alternatives for the proposed anesthesia with the patient or authorized representative who  has indicated his/her understanding and acceptance.   Dental advisory given  Plan Discussed with: CRNA, Anesthesiologist and Surgeon  Anesthesia Plan Comments:         Anesthesia Quick Evaluation

## 2014-09-21 NOTE — Transfer of Care (Signed)
Immediate Anesthesia Transfer of Care Note  Patient: Harold Scott  Procedure(s) Performed: Procedure(s): ATRIAL FIBRILLATION ABLATION (N/A)  Patient Location: PACU and Cath Lab  Anesthesia Type:General  Level of Consciousness: awake, alert , oriented, patient cooperative and responds to stimulation  Airway & Oxygen Therapy: Patient Spontanous Breathing and Patient connected to face mask oxygen  Post-op Assessment: Report given to RN, Post -op Vital signs reviewed and stable and Patient moving all extremities X 4  Post vital signs: Reviewed and stable  Last Vitals:  Filed Vitals:   09/21/14 0536  BP: 137/75  Pulse: 57  Temp: 37.1 C  Resp: 18    Complications: No apparent anesthesia complications

## 2014-09-21 NOTE — Interval H&P Note (Signed)
History and Physical Interval Note:  09/21/2014 7:07 AM  Harold Scott  has presented today for surgery, with the diagnosis of afib  The various methods of treatment have been discussed with the patient and family. After consideration of risks, benefits and other options for treatment, the patient has consented to  Procedure(s): ATRIAL FIBRILLATION ABLATION (N/A) as a surgical intervention .  The patient's history has been reviewed, patient examined, no change in status, stable for surgery.  I have reviewed the patient's chart and labs.  Questions were answered to the patient's satisfaction.     [name]

## 2014-09-21 NOTE — Op Note (Signed)
SURGEON:  [name], MD  PREPROCEDURE DIAGNOSES: 1. Paroxysmal atrial fibrillation.  POSTPROCEDURE DIAGNOSES: 1. Paroxysmal  atrial fibrillation.  PROCEDURES: 1. Comprehensive electrophysiologic study. 2. Coronary sinus pacing and recording. 3. Three-dimensional mapping of atrial fibrillation with additional mapping and ablation within the right atrium 4. Ablation of atrial fibrillation with additional mapping and ablation within the right atrium 5. Intracardiac echocardiography. 6. Transseptal puncture of an intact septum. 7. Rotational Angiography with processing at an independent workstation 8. Arrhythmia induction with pacing with dobutamine infusion  INTRODUCTION:  Harold Scott is a 54 y.o. male with a history of paroxysmal atrial fibrillation who now presents for EP study and radiofrequency ablation.  The patient reports initially being diagnosed with atrial fibrillation after presenting with symptomatic palpitations and fatgiue. The patient reports increasing frequency and duration of atrial fibrillation since that time.  The patient has failed medical therapy with Flecainide, Multaq, and TIkosyn.  The patient therefore presents today for catheter ablation of atrial fibrillation.  DESCRIPTION OF PROCEDURE:  Informed written consent was obtained, and the patient was brought to the electrophysiology lab in a fasting state.  The patient was adequately sedated with intravenous medications as outlined in the anesthesia report.  The patient's left and right groins were prepped and draped in the usual sterile fashion by the EP lab staff.  Using a percutaneous Seldinger technique, two 7-French and one 11-French hemostasis sheaths were placed into the right common femoral vein.  3 Dimensional Rotational Angiography: A 5 french pigtail catheter was introduced through the right common femoral vein and advanced into the inferior venocava.  3 demential rotational angiography was then performed  by power injection of 100cc of nonionic contrast.  Reprocessing at an independent work station was then performed.   This demonstrated a moderate sized left atrium with 4 separate pulmonary veins which were also moderate in size.  There were no anomalous veins or significant abnormalities.  A 3 dimensional rendering of the left atrium was then merged using [employer] onto the [employer] system and registered with intracardiac echo (see below).  The pigtail catheter was then removed.  Catheter Placement:  A 7-French Bios[employer] Webster Decapolar coronary sinus catheter was introduced through the right common femoral vein and advanced into the coronary sinus for recording and pacing from this location.  Due to the acute angle of the CS, coronary sinus catheter placement was quite challenging.   A 6-French quadripolar Josephson catheter was introduced through the right common femoral vein and advanced into the right ventricle for recording and pacing.  This catheter was then pulled back to the His bundle location.    Initial Measurements: The patient presented to the electrophysiology lab in sinus rhythm.  His PR interval measured 165 msec with a QRS duration of 111 msec.    Intracardiac Echocardiography: A 10-French [employer] Webster AcuNav intracardiac echocardiography catheter was introduced through the left common femoral vein and advanced into the right atrium. Intracardiac echocardiography was performed of the left atrium, and a three-dimensional anatomical rendering of the left atrium was performed using CARTO sound technology.  The patient was noted to have a moderate to large sized left atrium.  The interatrial septum was prominent with a small anterior PFO. All 4 pulmonary veins were visualized and noted to have separate ostia.  The pulmonary veins were moderate in size.  The left atrial appendage was visualized and did not reveal thrombus.   There was no evidence of pulmonary vein  stenosis.   Transseptal  Puncture: The middle right common femoral vein sheath was exchanged for an 8.5 [place] SL2 transseptal sheath and transseptal access was achieved in a standard fashion using a Brockenbrough needle under biplane fluoroscopy with intracardiac echocardiography confirmation of the transseptal puncture.  Once transseptal access had been achieved, heparin was administered intravenously and intra- arterially in order to maintain an ACT of greater than 300 seconds throughout the procedure.   Fluoroscopic visualization was very challenging today due to the patients body habitus.  3D Mapping and Ablation: The His bundle catheter was removed and in its place a 3.5 mm [employer] Thermocool ablation catheter was advanced into the right atrium.  The transseptal sheath was pulled back into the IVC over a guidewire.  The ablation catheter was advanced across the transseptal hole using the wire as a guide.  The transseptal sheath was then re-advanced over the guidewire into the left atrium.  A duodecapolar Biosense Webster circular mapping catheter was introduced through the transseptal sheath and positioned over the mouth of all 4 pulmonary veins.  Three-dimensional electroanatomical mapping was performed using CARTO technology.  This demonstrated electrical activity within all four pulmonary veins at baseline. The patient underwent successful sequential electrical isolation and anatomical encircling of all four pulmonary veins using radiofrequency current with a circular mapping catheter as a guide.  Due to its very angulated take off, the right inferior pulmonary vein could not be fully isolated today.  The circular mapping catheter was pulled back into the right atrium and 3D mapping was performed at the junction of the superior vena cava and right atrium.  Electrical activity was observed within the SVC.  I therefore elected to perform right atrial ablation in this area.  A  series of radiofrequency applications were delivered in a circular fashion around the ostium of the SVC.  Prior to each ablation lesions, pacing was performed from the distal ablation electrode to insure that diaphragmatic stimulation was not observed to avoid phrenic nerve injury.  Diaphragmatic excursion was also observed during ablation.    Measurements Following Ablation: Following ablation, dobutamine was infused up to 10 mcg/kg/min with no inducible atrial fibrillation, atrial tachycardia, atrial flutter, or sustained PACs. In sinus rhythm the PR 155 msec, QRS 111 msec.  Following ablation the AH interval measured 63 msec with an HV interval of 51 msec. Ventricular pacing was performed, which revealed VA dissociation when pacing at 600 msec.  Rapid atrial pacing was performed, which revealed an AV Wenckebach cycle length of 450 msec.  Electroisolation was then again confirmed in all four pulmonary veins.  Pacing was performed along the ablation line which confirmed entrance and exit block.  The procedure was therefore considered completed.  All catheters were removed, and the sheaths were aspirated and flushed.  The patient was transferred to the recovery area for sheath removal per protocol. EBL<65ml.  A limited bedside transthoracic echocardiogram revealed no pericardial effusion.  There were no early apparent complications.  CONCLUSIONS: 1. Sinus rhythm upon presentation.   2. Rotational Angiography reveals a moderate to large sized left atrium with four separate pulmonary veins without evidence of pulmonary vein stenosis. 3. Successful electrical isolation and anatomical encircling of all four pulmonary veins with radiofrequency current.   Due to its very angulated take off, the right inferior pulmonary vein could not be fully isolated today.  Fluoroscopic visualization was also difficult due to the patients body habitus.  Additional ablation was performed at the SVC/RA junction. 4. No  inducible arrhythmias following  ablation both on and off of dobutamine 5. No early apparent complications.   Kamar Callender,MD 10:59 AM 09/21/2014

## 2014-09-21 NOTE — Progress Notes (Addendum)
Site area: rt groin Site Prior to Removal:  Level  0 Pressure Applied For: 20 minutes Manual:   yes Patient Status During Pull:  stable Post Pull Site:  Level 0 Post Pull Instructions Given:  yes Post Pull Pulses Present: yes Dressing Applied:  tegaderm Bedrest begins @  1240 Comments: 0 complications. IV saline locked. O2 at 2L [place]

## 2014-09-21 NOTE — Anesthesia Postprocedure Evaluation (Signed)
Anesthesia Post Note  Patient: Harold Scott  Procedure(s) Performed: Procedure(s) (LRB): ATRIAL FIBRILLATION ABLATION (N/A)  Anesthesia type: general  Patient location: PACU  Post pain: Pain level controlled  Post assessment: Patient's Cardiovascular Status Stable  Last Vitals:  Filed Vitals:   09/21/14 1120  BP:   Pulse: 57  Temp:   Resp: 15    Post vital signs: Reviewed and stable  Level of consciousness: sedated  Complications: No apparent anesthesia complications

## 2014-09-21 NOTE — Discharge Summary (Signed)
ELECTROPHYSIOLOGY PROCEDURE DISCHARGE SUMMARY    Patient ID: Harold Scott,  MRN: [id number], DOB/AGE: Jul 13, 1961 54 y.o.  Admit date: 09/21/2014 Discharge date: 09/22/2014  Primary Care Physician: [name], MD Electrophysiologist: [name], MD  Primary Discharge Diagnosis:  Paroxysmal atrial fibrillation status post ablation this admission  Secondary Discharge Diagnosis:  1.  Sleep apnea - on CPAP 2.  Obesity 3.  Depression 4.  Asthma  Procedures This Admission:  1.  Electrophysiology study and radiofrequency catheter ablation on 09/21/14 by Dr [name].  This study demonstrated sinus rhythm upon presentation; rotational Angiography reveals a moderate to large sized left atrium with four separate pulmonary veins without evidence of pulmonary vein stenosis; successful electrical isolation and anatomical encircling of all four pulmonary veins with radiofrequency current. Due to its very angulated take off, the right inferior pulmonary vein could not be fully isolated today. Fluoroscopic visualization was also difficult due to the patients body habitus. Additional ablation was performed at the SVC/RA junction; no inducible arrhythmias following ablation both on and off of dobutamine.  There were no early apparent complications.   Brief HPI: Harold Scott is a 54 y.o. male with a history of paroxysmal atrial fibrillation.  They have failed medical therapy with Flecainide, Multaq, and Tikosyn. Risks, benefits, and alternatives to catheter ablation of atrial fibrillation were reviewed with the patient who wished to proceed.  The patient underwent TEE prior to the procedure which demonstrated normal LV function and no LAA thrombus and PFO.    Hospital Course:  The patient was admitted and underwent EPS/RFCA of atrial fibrillation with details as outlined above.  They were monitored on telemetry overnight which demonstrated sinus rhythm with short run of NSVT.  Groin was  without complication on the day of discharge.  The patient was examined by Dr [name] and considered to be stable for discharge.  Wound care and restrictions were reviewed with the patient.  The patient will be seen back by Dr [name] in 12 weeks for post ablation follow up.   Discharge Vitals: Blood pressure 126/67, pulse 48, temperature 98.3 F (36.8 C), temperature source Oral, resp. rate 14, height 5\' 8"  (1.727 m), weight 279 lb 12.2 oz (126.9 kg), SpO2 96 %.  Physical Exam: Filed Vitals:   09/22/14 0200 09/22/14 0351 09/22/14 0745 09/22/14 0800  BP: 125/64 108/47  126/67  Pulse: 49 50  48  Temp:  98 F (36.7 C) 98.3 F (36.8 C)   TempSrc:  Oral Oral   Resp:  14    Height:      Weight:      SpO2: 94% 95% 97% 96%    GEN- The patient is well appearing, alert and oriented x 3 today.   Head- normocephalic, atraumatic Eyes-  Sclera clear, conjunctiva pink Ears- hearing intact Oropharynx- clear Neck- supple, Lungs- Clear to ausculation bilaterally, normal work of breathing Heart- Regular rate and rhythm, no murmurs, rubs or gallops, PMI not laterally displaced GI- soft, NT, ND, + BS Extremities- no clubbing, cyanosis, or edema, groin is without hematoma/ bruit MS- no significant deformity or atrophy Skin- no rash or lesion Psych- euthymic mood, full affect Neuro- strength and sensation are intact   Labs:   Lab Results  Component Value Date   WBC 10.8* 09/14/2014   HGB 13.6 09/14/2014   HCT 39.4 09/14/2014   MCV 89.5 09/14/2014   PLT 252.0 09/14/2014     Recent Labs Lab 09/22/14 0320  NA 132*  K 3.6  CL 95*  CO2 29  BUN 7  CREATININE 0.88  CALCIUM 8.4  GLUCOSE 144*    Discharge Medications:    Medication List    STOP taking these medications        metoprolol succinate 50 MG 24 hr tablet  Commonly known as:  TOPROL-XL      TAKE these medications        apixaban 5 MG Tabs tablet  Commonly known as:  ELIQUIS  Take 1 tablet (5 mg total) by mouth 2  (two) times daily.     cholestyramine 4 G packet  Commonly known as:  QUESTRAN  TAKE 2 PACKETS DAILY     citalopram 10 MG tablet  Commonly known as:  CELEXA  Take 1 tablet (10 mg total) by mouth daily.     clorazepate 7.5 MG tablet  Commonly known as:  TRANXENE  Take 1 tablet (7.5 mg total) by mouth at bedtime.     dofetilide 500 MCG capsule  Commonly known as:  TIKOSYN  Take 1 capsule (500 mcg total) by mouth 2 (two) times daily.     esomeprazole 40 MG capsule  Commonly known as:  NEXIUM  Take 40 mg by mouth daily at 12 noon.     guaiFENesin 200 MG tablet  Take 200 mg by mouth 2 (two) times daily as needed (for cold).     ibuprofen 200 MG tablet  Commonly known as:  ADVIL,MOTRIN  Take 400 mg by mouth every 6 (six) hours as needed. For pain     losartan 50 MG tablet  Commonly known as:  COZAAR  Take 1 tablet (50 mg total) by mouth daily.     simvastatin 20 MG tablet  Commonly known as:  ZOCOR  Take 1 tablet (20 mg total) by mouth daily at 6 PM.        Disposition:   Follow-up Information    Follow up with [name], MD On 12/27/2014.   Specialty:  Cardiology   Why:  3:15 pm   Contact information:   [place] [address] [phone]       Duration of Discharge Encounter: Greater than 30 minutes including physician time.  Signed,  [name] MD

## 2014-09-21 NOTE — Progress Notes (Signed)
Utilization Review Completed.Donne Anon T2/09/2014

## 2014-09-21 NOTE — H&P (View-Only) (Signed)
Electrophysiology Office Note   Date:  09/02/2014   ID:  Harold Scott, DOB September 08, 1960, MRN [id number]  PCP:  [name], MD   Primary Electrophysiologist: Joelyn Lover   Chief Complaint  Patient presents with  . Atrial Fibrillation     History of Present Illness: Harold Scott is a 54 y.o. male who presents today for electrophysiology evaluation.   His afib continues to increase in both frequency and duration.  He finds this disturbing with symptoms of SOB, palpitations, and fatigue.Marland Kitchen  He is unaware of triggers/ precipitants.  He reports compliance with tikosyn.  He has lost 7 lbs and continues to work on [employer] recuction.  He is compliant with CPAP.   Today, he denies symptoms of  chest pain, orthopnea, PND, lower extremity edema, claudication, dizziness, presyncope, syncope, bleeding, or neurologic sequela. The patient is tolerating medications without difficulties and is otherwise without complaint today.    Past Medical History  Diagnosis Date  . Anxiety   . Depression   . GERD (gastroesophageal reflux disease)   . Gallstones   . Asthma   . PONV (postoperative nausea and vomiting)   . Paroxysmal atrial fibrillation     a.  previously intolerant of Flecainide and Multaq  . Obstructive sleep apnea     a. on CPAP, followed by [facility]   Past Surgical History  Procedure Laterality Date  . Hand surgery    . Tonsillectomy    . Wisdom tooth extraction    . Cholecystectomy  08/05/2012    Procedure: LAPAROSCOPIC CHOLECYSTECTOMY WITH INTRAOPERATIVE CHOLANGIOGRAM;  Surgeon: [name], MD,FACS;  Location: [facility];  Service: General;  Laterality: N/A;  . Laparoscopic appendectomy N/A 02/13/2014    Procedure: APPENDECTOMY LAPAROSCOPIC;  Surgeon: [name], MD;  Location: [facility];  Service: General;  Laterality: N/A;     Current Outpatient Prescriptions  Medication Sig Dispense Refill  . apixaban (ELIQUIS) 5 MG TABS tablet Take 1 tablet (5 mg total) by mouth 2 (two)  times daily. 60 tablet 3  . cholestyramine (QUESTRAN) 4 G packet TAKE 2 PACKETS DAILY (Patient taking differently: TAKE 2 PACKETS BY MOUTH DAILY) 60 packet 10  . citalopram (CELEXA) 10 MG tablet Take 1 tablet (10 mg total) by mouth daily. 90 tablet 2  . clorazepate (TRANXENE) 7.5 MG tablet Take 1 tablet (7.5 mg total) by mouth at bedtime. 30 tablet 5  . dofetilide (TIKOSYN) 500 MCG capsule Take 1 capsule (500 mcg total) by mouth 2 (two) times daily. 14 capsule 0  . esomeprazole (NEXIUM) 40 MG capsule Take 40 mg by mouth daily at 12 noon.    Marland Kitchen guaiFENesin 200 MG tablet Take 200 mg by mouth 2 (two) times daily as needed (for cold).    Marland Kitchen ibuprofen (ADVIL,MOTRIN) 200 MG tablet Take 400 mg by mouth every 6 (six) hours as needed. For pain    . losartan (COZAAR) 50 MG tablet Take 1 tablet (50 mg total) by mouth daily. 90 tablet 3  . metoprolol succinate (TOPROL-XL) 50 MG 24 hr tablet Take 50mg  (1 pill) by mouth every morning and 25mg  (1/2 pill) at dinner (Patient taking differently: 75 mg. Take 50mg  (1 pill) by mouth every morning and 25mg  (1/2 pill) at dinner) 135 tablet 3  . simvastatin (ZOCOR) 20 MG tablet Take 1 tablet (20 mg total) by mouth daily at 6 PM. 30 tablet 11   No current facility-administered medications for this visit.    Allergies:   Flecainide; Multaq;  and Penicillins   Social History:  The patient  reports that he quit smoking about 8 years ago. His smoking use included Cigarettes. He has a 15 pack-year smoking history. He has never used smokeless tobacco. He reports that he drinks about 4.2 - 8.4 oz of alcohol per week. He reports that he does not use illicit drugs.   Family History:  Adopted and does not know FH    ROS:  Please see the history of present illness.     All other systems are reviewed and negative.    PHYSICAL EXAM: VS:  BP 132/88 mmHg  Pulse 55  Ht 5\' 8"  (1.727 m)  Wt 272 lb (123.378 kg)  BMI 41.37 kg/m2 , BMI Body mass index is 41.37 kg/(m^2). GEN:  overweight, in no acute distress HEENT: normal Neck: no JVD, carotid bruits, or masses Cardiac: RRR; no murmurs, rubs, or gallops,no edema  Respiratory:  clear to auscultation bilaterally, normal work of breathing GI: soft, nontender, nondistended, + BS MS: no deformity or atrophy Skin: warm and dry,  Neuro:  Strength and sensation are intact Psych: euthymic mood, full affect  EKG:  EKG is ordered today. The ekg ordered today shows sinus rhythm 55 bpm Qtc 461   Recent Labs: 07/18/2014: Pro B Natriuretic peptide (BNP) 1325.0* 07/19/2014: ALT 43 07/20/2014: Hemoglobin 14.8; Platelets 198; TSH 5.500* 08/18/2014: BUN 10; Creatinine 0.9; Magnesium 1.7; Potassium 4.0; Sodium 135    Lipid Panel     Component Value Date/Time   CHOL 228* 07/19/2014 0425   TRIG 338* 07/19/2014 0425   HDL 30* 07/19/2014 0425   CHOLHDL 7.6 07/19/2014 0425   VLDL 68* 07/19/2014 0425   LDLCALC 130* 07/19/2014 0425     Wt Readings from Last 3 Encounters:  09/02/14 272 lb (123.378 kg)  08/18/14 276 lb 8 oz (125.42 kg)  07/23/14 277 lb 1.6 oz (125.692 kg)     Other studies Reviewed: Additional studies/ records that were reviewed today include: Roderic Palau NPs recent note and echo 11/15  Review of the above records today demonstrates: normal EF, LA size 56 mm, no MR   ASSESSMENT AND PLAN:  1.  Paroxysmal atrial fibrillation Unfortunately his afib continues to worsen.  He does not tolerate this well.  He has failed medical therapy. Therapeutic strategies for afib including medicine and ablation were discussed in detail with the patient today. Risk, benefits, and alternatives to EP study and radiofrequency ablation for afib were also discussed in detail today. These risks include but are not limited to stroke, bleeding, vascular damage, tamponade, perforation, damage to the esophagus, lungs, and other structures, pulmonary vein stenosis, worsening renal function, and death. The patient understands these  risk and wishes to proceed.  We will therefore proceed with catheter ablation at the next available time.  He will have a TEE prior to the procedure to exclude LA thrombus. chads2vasc is 0.  He is on eliquis and will need to remain on this 3 months post ablation.  2. OSA Compliance with CPAP is encouraged  3. Overweight. I have reviewed the patients BMI and decreased success rates with ablation at length today.  Weight loss is strongly advised.  Per Guijian et al (PACE 2013; 36: 132-440), patients with BMI 25-29.9 (obese) have a 27% increase in AF recurrence post ablation.  Patients with BMI >30 have a 31% increase in AF recurrence post ablation when compared to those with BMI <25.   Current medicines are reviewed at length with the patient  today.   The patient is does not have concerns regarding his medicines.      Army Fossa, MD  09/02/2014 3:33 PM      [address] [place] [address] [phone] (office) [phone] (fax)

## 2014-09-22 DIAGNOSIS — I480 Paroxysmal atrial fibrillation: Secondary | ICD-10-CM | POA: Diagnosis not present

## 2014-09-22 LAB — BASIC METABOLIC PANEL
ANION GAP: 8 (ref 5–15)
BUN: 7 mg/dL (ref 6–23)
CALCIUM: 8.4 mg/dL (ref 8.4–10.5)
CO2: 29 mmol/L (ref 19–32)
Chloride: 95 mmol/L — ABNORMAL LOW (ref 96–112)
Creatinine, Ser: 0.88 mg/dL (ref 0.50–1.35)
GLUCOSE: 144 mg/dL — AB (ref 70–99)
POTASSIUM: 3.6 mmol/L (ref 3.5–5.1)
Sodium: 132 mmol/L — ABNORMAL LOW (ref 135–145)

## 2014-09-22 NOTE — Discharge Instructions (Addendum)
Information on my medicine - ELIQUIS (apixaban)  This medication education was reviewed with me or my healthcare representative as part of my discharge preparation.  The pharmacist that spoke with me during my hospital stay was:  Georgina Peer, [facility]  Why was Eliquis prescribed for you? Eliquis was prescribed for you to reduce the risk of a blood clot forming that can cause a stroke if you have a medical condition called atrial fibrillation (a type of irregular heartbeat).  What do You need to know about Eliquis ? Take your Eliquis TWICE DAILY - one tablet in the morning and one tablet in the evening with or without food. If you have difficulty swallowing the tablet whole please discuss with your pharmacist how to take the medication safely.  Take Eliquis exactly as prescribed by your doctor and DO NOT stop taking Eliquis without talking to the doctor who prescribed the medication.  Stopping may increase your risk of developing a stroke.  Refill your prescription before you run out.  After discharge, you should have regular check-up appointments with your healthcare provider that is prescribing your Eliquis.  In the future your dose may need to be changed if your kidney function or weight changes by a significant amount or as you get older.  What do you do if you miss a dose? If you miss a dose, take it as soon as you remember on the same day and resume taking twice daily.  Do not take more than one dose of ELIQUIS at the same time to make up a missed dose.  Important Safety Information A possible side effect of Eliquis is bleeding. You should call your healthcare provider right away if you experience any of the following: ? Bleeding from an injury or your nose that does not stop. ? Unusual colored urine (red or dark brown) or unusual colored stools (red or black). ? Unusual bruising for unknown reasons. ? A serious fall or if you hit your head (even if there is no  bleeding).  Some medicines may interact with Eliquis and might increase your risk of bleeding or clotting while on Eliquis. To help avoid this, consult your healthcare provider or pharmacist prior to using any new prescription or non-prescription medications, including herbals, vitamins, non-steroidal anti-inflammatory drugs (NSAIDs) and supplements.  This website has more information on Eliquis (apixaban): http://www.eliquis.com/eliquis/home     No driving for 5 days. No lifting over 5 lbs for 1 week. No sexual activity for 1 week. You may return to work in 7 days. Keep procedure site clean & dry. If you notice increased pain, swelling, bleeding or pus, call/return!  You may shower, but no soaking baths/hot tubs/pools for 1 week.

## 2014-09-24 ENCOUNTER — Telehealth: Payer: Self-pay | Admitting: Internal Medicine

## 2014-09-24 NOTE — Telephone Encounter (Signed)
New Message    Patient is in A-Fib for about 21 hours post ablation (09/21/14). Patient was told to call Claiborne Billings (per [name]) if it was more than 24 hours.   Please give patient a call back.

## 2014-09-24 NOTE — Telephone Encounter (Signed)
Spoke with patient and his ablation his ablation was Tues and thinks he has been out of rhythm because the Live Cor EKG shows HR 111 around 6:45pm but thinks he went out of rhythm around 1:00pm yesterday.  Still taking Tikosyn but Metoprolol was stopped  He was on 50mg  in the am and 25 mg at night HR now 93.  He is going email the EKG.  Will look at EKG with Roderic Palau, NP and restart his Metoprolol at 25mg  twice daily.  Will take this over the weekend and let me know Molli Knock how he is doing

## 2014-09-27 ENCOUNTER — Telehealth: Payer: Self-pay | Admitting: Internal Medicine

## 2014-09-27 ENCOUNTER — Encounter: Payer: Self-pay | Admitting: Internal Medicine

## 2014-09-27 DIAGNOSIS — I480 Paroxysmal atrial fibrillation: Secondary | ICD-10-CM

## 2014-09-27 NOTE — Telephone Encounter (Signed)
New Message       Pt calling stating that he has been on A-fib since Thursday and was told by Harold Scott to call this morning if he was still in a-fib after the weekdn and pt states that he is. Please call back and advise.

## 2014-09-27 NOTE — Telephone Encounter (Signed)
Spoke with patient reviewed EKG will set up for DCCV tomorrow and see Ceasar Lund prior.  Procedure set up for 1:00 09/28/14 with Dr Radford Pax

## 2014-09-28 ENCOUNTER — Other Ambulatory Visit (INDEPENDENT_AMBULATORY_CARE_PROVIDER_SITE_OTHER): Payer: 59 | Admitting: *Deleted

## 2014-09-28 ENCOUNTER — Encounter: Payer: Self-pay | Admitting: Nurse Practitioner

## 2014-09-28 ENCOUNTER — Ambulatory Visit (INDEPENDENT_AMBULATORY_CARE_PROVIDER_SITE_OTHER): Payer: 59 | Admitting: Nurse Practitioner

## 2014-09-28 ENCOUNTER — Ambulatory Visit (HOSPITAL_COMMUNITY): Admission: RE | Admit: 2014-09-28 | Payer: 59 | Source: Ambulatory Visit | Admitting: Cardiology

## 2014-09-28 ENCOUNTER — Encounter (HOSPITAL_COMMUNITY): Admission: RE | Payer: Self-pay | Source: Ambulatory Visit

## 2014-09-28 VITALS — BP 122/78 | HR 72 | Ht 68.0 in | Wt 280.0 lb

## 2014-09-28 DIAGNOSIS — I480 Paroxysmal atrial fibrillation: Secondary | ICD-10-CM

## 2014-09-28 LAB — CBC WITH DIFFERENTIAL/PLATELET
Basophils Absolute: 0 10*3/uL (ref 0.0–0.1)
Basophils Relative: 0.3 % (ref 0.0–3.0)
EOS ABS: 0.1 10*3/uL (ref 0.0–0.7)
Eosinophils Relative: 1.2 % (ref 0.0–5.0)
HEMATOCRIT: 40.8 % (ref 39.0–52.0)
HEMOGLOBIN: 13.8 g/dL (ref 13.0–17.0)
LYMPHS ABS: 2.3 10*3/uL (ref 0.7–4.0)
LYMPHS PCT: 22.2 % (ref 12.0–46.0)
MCHC: 33.7 g/dL (ref 30.0–36.0)
MCV: 90.2 fl (ref 78.0–100.0)
Monocytes Absolute: 0.9 10*3/uL (ref 0.1–1.0)
Monocytes Relative: 8.2 % (ref 3.0–12.0)
NEUTROS ABS: 7.1 10*3/uL (ref 1.4–7.7)
Neutrophils Relative %: 68.1 % (ref 43.0–77.0)
Platelets: 324 10*3/uL (ref 150.0–400.0)
RBC: 4.52 Mil/uL (ref 4.22–5.81)
RDW: 13.6 % (ref 11.5–15.5)
WBC: 10.4 10*3/uL (ref 4.0–10.5)

## 2014-09-28 LAB — BASIC METABOLIC PANEL
BUN: 12 mg/dL (ref 6–23)
CALCIUM: 9 mg/dL (ref 8.4–10.5)
CO2: 28 meq/L (ref 19–32)
Chloride: 100 mEq/L (ref 96–112)
Creatinine, Ser: 0.86 mg/dL (ref 0.40–1.50)
GFR: 98.88 mL/min (ref >60.00)
Glucose, Bld: 118 mg/dL — ABNORMAL HIGH (ref 70–99)
Potassium: 4.2 mEq/L (ref 3.5–5.1)
SODIUM: 135 meq/L (ref 135–145)

## 2014-09-28 LAB — MAGNESIUM: MAGNESIUM: 1.8 mg/dL (ref 1.5–2.5)

## 2014-09-28 SURGERY — CARDIOVERSION
Anesthesia: Monitor Anesthesia Care

## 2014-09-28 MED ORDER — METOPROLOL TARTRATE 50 MG PO TABS: 25.0000 mg | ORAL_TABLET | Freq: Two times a day (BID) | ORAL | Status: DC

## 2014-09-28 NOTE — Patient Instructions (Addendum)
Your physician recommends that you schedule a follow-up appointment as scheduled 5/9 with Dr [name]  Cardioversion canceled    Will call you later today with Tikosyn dosing

## 2014-09-28 NOTE — Progress Notes (Signed)
Date:  09/28/2014   ID:  [name], DOB Apr 08, 1961, MRN [id number]  PCP:  [name], MD   Primary Electrophysiologist: [name]      History of Present Illness: Harold Scott is a 54 y.o. male who presents today for electrophysiology evaluation with recent PVI 09/21/14 but had onset of persistent afib staring last Thursday. He was being brought in today to be scheduled for DCCV and found to be in NSR.He continues on Tikosyn 500 mg bid and last week when he was in Afib with RVR, metoprolol 25 mg bid was added. No other med changes. Being compliant with NOAC. He has ALIVECOR app and has been able to transmit his strips of afib on Friday as well as Monday. His app confirms he his back in SR today.   Past Medical History  Diagnosis Date  . Anxiety   . Depression   . GERD (gastroesophageal reflux disease)   . Gallstones   . Asthma   . PONV (postoperative nausea and vomiting)   . Paroxysmal atrial fibrillation     a.  previously intolerant of Flecainide and Multaq b. PVI by Dr [name] 09/21/14  . Obstructive sleep apnea     a. on CPAP, followed by [facility]   Past Surgical History  Procedure Laterality Date  . Hand surgery    . Tonsillectomy    . Wisdom tooth extraction    . Cholecystectomy  08/05/2012    Procedure: LAPAROSCOPIC CHOLECYSTECTOMY WITH INTRAOPERATIVE CHOLANGIOGRAM;  Surgeon: [name], MD,FACS;  Location: [facility];  Service: General;  Laterality: N/A;  . Laparoscopic appendectomy N/A 02/13/2014    Procedure: APPENDECTOMY LAPAROSCOPIC;  Surgeon: [name], MD;  Location: [facility];  Service: General;  Laterality: N/A;  . Tee without cardioversion N/A 09/20/2014    Procedure: TRANSESOPHAGEAL ECHOCARDIOGRAM (TEE);  Surgeon: [name], MD;  Location: [facility];  Service: Cardiovascular;  Laterality: N/A;  . Ablation  09/21/14    PVI by Dr [name]  . Atrial fibrillation ablation N/A 09/21/2014    Procedure: ATRIAL FIBRILLATION ABLATION;  Surgeon: [name], MD;  Location: [facility];  Service: Cardiovascular;  Laterality: N/A;     Current Outpatient Prescriptions  Medication Sig Dispense Refill  . apixaban (ELIQUIS) 5 MG TABS tablet Take 1 tablet (5 mg total) by mouth 2 (two) times daily. 60 tablet 3  . cholestyramine (QUESTRAN) 4 G packet TAKE 2 PACKETS DAILY (Patient taking differently: TAKE 2 PACKETS BY MOUTH DAILY) 60 packet 10  . citalopram (CELEXA) 10 MG tablet Take 1 tablet (10 mg total) by mouth daily. 90 tablet 2  . clorazepate (TRANXENE) 7.5 MG tablet Take 1 tablet (7.5 mg total) by mouth at bedtime. 30 tablet 5  . dofetilide (TIKOSYN) 500 MCG capsule Take 1 capsule (500 mcg total) by mouth 2 (two) times daily. 14 capsule 0  . esomeprazole (NEXIUM) 40 MG capsule Take 40 mg by mouth daily at 12 noon.    Marland Kitchen guaiFENesin 200 MG tablet Take 200 mg by mouth 2 (two) times daily as needed (for cold).    Marland Kitchen ibuprofen (ADVIL,MOTRIN) 200 MG tablet Take 400 mg by mouth every 6 (six) hours as needed. For pain    . losartan (COZAAR) 50 MG tablet Take 1 tablet (50 mg total) by mouth daily. 90 tablet 3  . simvastatin (ZOCOR) 20 MG tablet Take 1 tablet (20 mg total) by mouth daily at 6 PM. 30 tablet 11  . metoprolol (  LOPRESSOR) 50 MG tablet Take 0.5 tablets (25 mg total) by mouth 2 (two) times daily. 90 tablet 3   No current facility-administered medications for this visit.    Allergies:   Flecainide; Multaq; and Penicillins   Social History:  The patient  reports that he quit smoking about 8 years ago. His smoking use included Cigarettes. He has a 15 pack-year smoking history. He has never used smokeless tobacco. He reports that he drinks about 4.2 - 8.4 oz of alcohol per week. He reports that he does not use illicit drugs.   Family History:  Adopted and does not know FH    ROS:  Please see the history of present illness.     All other systems are reviewed and negative.    PHYSICAL EXAM: VS:  BP 122/78 mmHg  Pulse 72  Ht 5\' 8"  (1.727  m)  Wt 280 lb (127.007 kg)  BMI 42.58 kg/m2 , BMI Body mass index is 42.58 kg/(m^2). GEN: overweight, in no acute distress HEENT: normal Neck: no JVD, carotid bruits, or masses Cardiac: RRR; no murmurs, rubs, or gallops,no edema  Respiratory:  clear to auscultation bilaterally, normal work of breathing GI: soft, nontender, nondistended, + BS MS: no deformity or atrophy Skin: warm and dry,  Neuro:  Strength and sensation are intact Psych: euthymic mood, full affect  EKG:  EKG is ordered today. EKG shows NSR with QTC at 503 ms corrected by calculation with Dr. Lovena Le at 490 ms.  Reviewed with [name] as well.  Recent Labs: 07/18/2014: Pro B Natriuretic peptide (BNP) 1325.0* 07/19/2014: ALT 43 07/20/2014: TSH 5.500* 08/18/2014: Magnesium 1.7 09/14/2014: Hemoglobin 13.6; Platelets 252.0 09/22/2014: BUN 7; Creatinine 0.88; Potassium 3.6; Sodium 132*   CBC, BMET and Magnesium added today.   Lipid Panel     Component Value Date/Time   CHOL 228* 07/19/2014 0425   TRIG 338* 07/19/2014 0425   HDL 30* 07/19/2014 0425   CHOLHDL 7.6 07/19/2014 0425   VLDL 68* 07/19/2014 0425   LDLCALC 130* 07/19/2014 0425     Wt Readings from Last 3 Encounters:  09/28/14 280 lb (127.007 kg)  09/21/14 279 lb 12.2 oz (126.9 kg)  09/02/14 272 lb (123.378 kg)     Other studies Reviewed: Review of the above records today demonstrates: normal EF, LA size 56 mm, no MR   ASSESSMENT AND PLAN:  1.  Paroxysmal atrial fibrillation Pt with recent afib ablation and persistent afib x 5 days with pt brought to office to schedule DCCV, but fortunately pt in NSR today. He was reminded that he may have mor afib in the first 3 months after ablation. He will contact the office if he has any prolonged episodes. Continue to monitor with ALIVECOR app.   2. 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 Lovena Le and DR. [name] with [name] stating pt has been on this dose for some time and he would not  change the dose at this time( previous QTC intervals 460-490 ms). Meds reiwed and no QT prolongation drugs recently added, BMET and Mag to be added   today.    2. OSA Compliance with CPAP is encouraged  3. Overweight. I have reviewed the patients BMI and decreased success rates with ablation at length today.  Weight loss is strongly advised.  Per Guijian et al (PACE 2013; 36: 235-361), patients with BMI 25-29.9 (obese) have a 27% increase in AF recurrence post ablation.  Patients with BMI >30 have a 31% increase in AF recurrence  post ablation when compared to those with BMI <25.   Current medicines are reviewed at length with the patient today.     F/u as scheduled with [name] in March as already scheduled, sooner if needed.   Eduard Roux, NP  09/28/2014 11:06 AM      [facility] [address] [place] [address] [phone] (office) [phone] (fax)

## 2014-09-28 NOTE — Addendum Note (Signed)
Addended by: Eulis Foster on: 09/28/2014 11:04 AM   Modules accepted: Orders

## 2014-10-20 ENCOUNTER — Encounter: Payer: PRIVATE HEALTH INSURANCE | Admitting: Internal Medicine

## 2014-10-25 ENCOUNTER — Encounter: Payer: PRIVATE HEALTH INSURANCE | Admitting: Internal Medicine

## 2014-10-28 ENCOUNTER — Other Ambulatory Visit: Payer: Self-pay | Admitting: Internal Medicine

## 2014-11-17 ENCOUNTER — Other Ambulatory Visit: Payer: Self-pay | Admitting: Physician Assistant

## 2014-11-19 ENCOUNTER — Other Ambulatory Visit: Payer: Self-pay | Admitting: Physician Assistant

## 2014-12-06 ENCOUNTER — Telehealth: Payer: Self-pay | Admitting: Internal Medicine

## 2014-12-06 NOTE — Telephone Encounter (Signed)
Called and talked with pt - was very frustrated by who experience since ablation stating he goes into afib all the time. Making him very fatigued. Does not feel SOB or dizziness/lightheaded.  He keeps track of his heart rate with livecor and will bring those EKGs in with him tomorrow for appointment with Butch Penny @ 330pm

## 2014-12-06 NOTE — Telephone Encounter (Signed)
New message      Pt had an ablation----he is in AFIB at least once a week.  He is in AFIB now.  Please advise

## 2014-12-07 ENCOUNTER — Encounter (HOSPITAL_COMMUNITY): Payer: Self-pay | Admitting: Nurse Practitioner

## 2014-12-07 ENCOUNTER — Ambulatory Visit (HOSPITAL_COMMUNITY)
Admission: RE | Admit: 2014-12-07 | Discharge: 2014-12-07 | Disposition: A | Discharge status: Home / Self Care | Payer: 59 | Source: Ambulatory Visit | Attending: Nurse Practitioner | Admitting: Nurse Practitioner

## 2014-12-07 ENCOUNTER — Other Ambulatory Visit: Payer: Self-pay

## 2014-12-07 VITALS — BP 140/92 | HR 65 | Ht 68.0 in | Wt 282.6 lb

## 2014-12-07 DIAGNOSIS — I481 Persistent atrial fibrillation: Secondary | ICD-10-CM | POA: Diagnosis not present

## 2014-12-07 DIAGNOSIS — G4733 Obstructive sleep apnea (adult) (pediatric): Secondary | ICD-10-CM | POA: Diagnosis not present

## 2014-12-07 DIAGNOSIS — E663 Overweight: Secondary | ICD-10-CM | POA: Diagnosis not present

## 2014-12-07 DIAGNOSIS — I4819 Other persistent atrial fibrillation: Secondary | ICD-10-CM

## 2014-12-07 MED ORDER — METOPROLOL TARTRATE 50 MG PO TABS: 25.0000 mg | ORAL_TABLET | Freq: Every day | ORAL | Status: DC

## 2014-12-07 NOTE — Patient Instructions (Signed)
Can take an extra 25mg  of Metoprolol if needed for AFib.

## 2014-12-07 NOTE — Progress Notes (Signed)
Date:  12/07/2014   ID:  [name], DOB 05/05/1961, MRN [id number]  PCP:  [name], MD   Primary Electrophysiologist: [name]     History of Present Illness: Harold Scott is a 54 y.o. male who presents today for evaluation in the afib clinic with h/o PVI 09/21/14 for persistent symptomatic afib. He continues on Tikosyn 500 mg bid and   Metoprolol xl 25 mg qd.   Being compliant with DOAC. He has [employer] app and has been able to track occurrence of afib. He is frustrated because he continues to have high afib burden. The duration of afib has decreased lasting now roughly 24 hours, vrs 36 hours, but now occuring weekly. Will usually wake up with it or it will start when eating his evening meal. No alcohol use and minimal caffeine. Sometimes just walking will also bring it on. Using cpap religiously. He was in afib yesterday when he called and made the appointment, but converted to SR around 1 pm today.   Past Medical History  Diagnosis Date  . Anxiety   . Depression   . GERD (gastroesophageal reflux disease)   . Gallstones   . Asthma   . PONV (postoperative nausea and vomiting)   . Paroxysmal atrial fibrillation     a.  previously intolerant of Flecainide and Multaq b. PVI by Dr [name] 09/21/14  . Obstructive sleep apnea     a. on CPAP, followed by Clance   Past Surgical History  Procedure Laterality Date  . Hand surgery    . Tonsillectomy    . Wisdom tooth extraction    . Cholecystectomy  08/05/2012    Procedure: LAPAROSCOPIC CHOLECYSTECTOMY WITH INTRAOPERATIVE CHOLANGIOGRAM;  Surgeon: [name], MD,FACS;  Location: [place];  Service: General;  Laterality: N/A;  . Laparoscopic appendectomy N/A 02/13/2014    Procedure: APPENDECTOMY LAPAROSCOPIC;  Surgeon: [name], MD;  Location: [facility];  Service: General;  Laterality: N/A;  . Tee without cardioversion N/A 09/20/2014    Procedure: TRANSESOPHAGEAL ECHOCARDIOGRAM (TEE);  Surgeon: [name], MD;  Location:  [place];  Service: Cardiovascular;  Laterality: N/A;  . Ablation  09/21/14    PVI by Dr [name]  . Atrial fibrillation ablation N/A 09/21/2014    Procedure: ATRIAL FIBRILLATION ABLATION;  Surgeon: [name], MD;  Location[facility] CATH LAB;  Service: Cardiovascular;  Laterality: N/A;     Current Outpatient Prescriptions  Medication Sig Dispense Refill  . cholestyramine (QUESTRAN) 4 G packet TAKE 2 PACKETS DAILY (Patient taking differently: TAKE 2 PACKETS BY MOUTH DAILY) 60 packet 10  . citalopram (CELEXA) 10 MG tablet Take 1 tablet (10 mg total) by mouth daily. 90 tablet 2  . clorazepate (TRANXENE) 7.5 MG tablet Take 1 tablet (7.5 mg total) by mouth at bedtime. 30 tablet 5  . dofetilide (TIKOSYN) 500 MCG capsule Take 1 capsule (500 mcg total) by mouth 2 (two) times daily. 14 capsule 0  . ELIQUIS 5 MG TABS tablet TAKE 1 TABLET TWICE A DAY 60 tablet 3  . esomeprazole (NEXIUM) 40 MG capsule Take 40 mg by mouth daily at 12 noon.    Marland Kitchen guaiFENesin 200 MG tablet Take 200 mg by mouth 2 (two) times daily as needed (for cold).    Marland Kitchen ibuprofen (ADVIL,MOTRIN) 200 MG tablet Take 400 mg by mouth every 6 (six) hours as needed. For pain    . losartan (COZAAR) 50 MG tablet TAKE 1 TABLET (50 MG TOTAL) BY MOUTH  DAILY. 90 tablet 0  . metoprolol (LOPRESSOR) 50 MG tablet Take 0.5 tablets (25 mg total) by mouth daily. Can take extra 25mg  if goes into Afib 90 tablet 3  . simvastatin (ZOCOR) 20 MG tablet Take 1 tablet (20 mg total) by mouth daily at 6 PM. 30 tablet 11   No current facility-administered medications for this encounter.    Allergies:   Flecainide; Multaq; and Penicillins   Social History:  The patient  reports that he quit smoking about 8 years ago. His smoking use included Cigarettes. He has a 15 pack-year smoking history. He has never used smokeless tobacco. He reports that he drinks about 4.2 - 8.4 oz of alcohol per week. He reports that he does not use illicit drugs.   Family History:  Adopted and  does not know FH    ROS:  Please see the history of present illness.     All other systems are reviewed and negative.    PHYSICAL EXAM: VS:  BP 140/92 mmHg  Pulse 65  Ht 5\' 8"  (1.727 m)  Wt 282 lb 9.6 oz (128.187 kg)  BMI 42.98 kg/m2 , BMI Body mass index is 42.98 kg/(m^2). GEN: overweight, in no acute distress HEENT: normal Neck: no JVD, carotid bruits, or masses Cardiac: RRR; no murmurs, rubs, or gallops,no edema  Respiratory:  clear to auscultation bilaterally, normal work of breathing GI: soft, nontender, nondistended, + BS MS: no deformity or atrophy Skin: warm and dry,  Neuro:  Strength and sensation are intact Psych: euthymic mood, full affect  EKG:  EKG is ordered today. EKG shows NSR  65 bpm, with QTC at 468 ms.   Other studies Reviewed: ECHO-11/35,  normal EF, LA size 56 mm, no MR   ASSESSMENT AND PLAN:  1.  Persistent afib Pt reminded that he is still in the 3 month healing period and his burden of afib will hopefully diminish as time goes on. Continue tikosyn Can try an extra 25 mg of metoprolol at bedtime since pt often wakes up with afib. Coniue eliquis with a chadsvasc score of at least 1.  2. OSA Very compliant with cpap.  3. Overweight. Weight loss encouraged.   F/u with [name] as scheduled May 9th. Will be due for bmet/mag at that visit.

## 2014-12-08 NOTE — Addendum Note (Signed)
Encounter addended by: Yehuda Mao on: 12/08/2014  9:03 AM<BR>     Documentation filed: Charges VN

## 2014-12-15 ENCOUNTER — Other Ambulatory Visit: Payer: Self-pay | Admitting: Family Medicine

## 2014-12-16 ENCOUNTER — Telehealth (HOSPITAL_COMMUNITY): Payer: Self-pay | Admitting: *Deleted

## 2014-12-16 MED ORDER — METOPROLOL TARTRATE 50 MG PO TABS: 50.0000 mg | ORAL_TABLET | Freq: Two times a day (BID) | ORAL | Status: DC

## 2014-12-16 NOTE — Telephone Encounter (Signed)
Discussed with [name]; will increase metoprolol to 50mg  twice a day until he sees him in office. Patient heart rate 90-110s and bp has been stable 140s/80s.   Instructed pt that it may not convert him but should reduce heart rate and hopefully make him feel better. Encouraged continued attempt with weight loss. Patient to call back if any further problems or questions.

## 2014-12-16 NOTE — Telephone Encounter (Signed)
Pt called in stating he has been in and out of afib for past several days but today has been in all day - feels miserable. Has been taking the extra 25mg  of metoprolol without any relief.  Sees [name] for appt on May 9th. Wanting to know if theres any extra medications he can try other than extra metoprolol as this is not touching the afib.  Will forward to Kelly/Dr.[name] since Butch Penny is out of town and he sees [name] for follow up next week.

## 2014-12-21 ENCOUNTER — Inpatient Hospital Stay (HOSPITAL_COMMUNITY)
Admission: EM | Admit: 2014-12-21 | Discharge: 2014-12-24 | DRG: 291 | Disposition: A | Discharge status: Home / Self Care | Payer: 59 | Attending: Internal Medicine | Admitting: Internal Medicine

## 2014-12-21 ENCOUNTER — Emergency Department (HOSPITAL_COMMUNITY): Payer: 59

## 2014-12-21 ENCOUNTER — Encounter (HOSPITAL_COMMUNITY): Payer: Self-pay | Admitting: *Deleted

## 2014-12-21 ENCOUNTER — Telehealth (HOSPITAL_COMMUNITY): Payer: Self-pay | Admitting: *Deleted

## 2014-12-21 DIAGNOSIS — R0600 Dyspnea, unspecified: Secondary | ICD-10-CM | POA: Diagnosis not present

## 2014-12-21 DIAGNOSIS — J45909 Unspecified asthma, uncomplicated: Secondary | ICD-10-CM | POA: Diagnosis present

## 2014-12-21 DIAGNOSIS — Z888 Allergy status to other drugs, medicaments and biological substances status: Secondary | ICD-10-CM

## 2014-12-21 DIAGNOSIS — F329 Major depressive disorder, single episode, unspecified: Secondary | ICD-10-CM | POA: Diagnosis present

## 2014-12-21 DIAGNOSIS — Z6841 Body Mass Index (BMI) 40.0 and over, adult: Secondary | ICD-10-CM

## 2014-12-21 DIAGNOSIS — R0902 Hypoxemia: Secondary | ICD-10-CM | POA: Diagnosis present

## 2014-12-21 DIAGNOSIS — Z880 Allergy status to penicillin: Secondary | ICD-10-CM

## 2014-12-21 DIAGNOSIS — I272 Other secondary pulmonary hypertension: Secondary | ICD-10-CM | POA: Diagnosis present

## 2014-12-21 DIAGNOSIS — F419 Anxiety disorder, unspecified: Secondary | ICD-10-CM | POA: Diagnosis present

## 2014-12-21 DIAGNOSIS — I10 Essential (primary) hypertension: Secondary | ICD-10-CM | POA: Diagnosis present

## 2014-12-21 DIAGNOSIS — I4819 Other persistent atrial fibrillation: Secondary | ICD-10-CM | POA: Diagnosis present

## 2014-12-21 DIAGNOSIS — J9601 Acute respiratory failure with hypoxia: Secondary | ICD-10-CM

## 2014-12-21 DIAGNOSIS — J9600 Acute respiratory failure, unspecified whether with hypoxia or hypercapnia: Secondary | ICD-10-CM

## 2014-12-21 DIAGNOSIS — R918 Other nonspecific abnormal finding of lung field: Secondary | ICD-10-CM | POA: Diagnosis present

## 2014-12-21 DIAGNOSIS — I5033 Acute on chronic diastolic (congestive) heart failure: Principal | ICD-10-CM | POA: Diagnosis present

## 2014-12-21 DIAGNOSIS — R0602 Shortness of breath: Secondary | ICD-10-CM | POA: Diagnosis present

## 2014-12-21 DIAGNOSIS — J189 Pneumonia, unspecified organism: Secondary | ICD-10-CM | POA: Diagnosis present

## 2014-12-21 DIAGNOSIS — Z87891 Personal history of nicotine dependence: Secondary | ICD-10-CM | POA: Diagnosis not present

## 2014-12-21 DIAGNOSIS — G4733 Obstructive sleep apnea (adult) (pediatric): Secondary | ICD-10-CM | POA: Diagnosis not present

## 2014-12-21 DIAGNOSIS — I480 Paroxysmal atrial fibrillation: Secondary | ICD-10-CM

## 2014-12-21 DIAGNOSIS — J129 Viral pneumonia, unspecified: Secondary | ICD-10-CM | POA: Diagnosis present

## 2014-12-21 DIAGNOSIS — K219 Gastro-esophageal reflux disease without esophagitis: Secondary | ICD-10-CM | POA: Diagnosis present

## 2014-12-21 LAB — SEDIMENTATION RATE: Sed Rate: 25 mm/hr — ABNORMAL HIGH (ref 0–16)

## 2014-12-21 LAB — BASIC METABOLIC PANEL
Anion gap: 12 (ref 5–15)
BUN: 9 mg/dL (ref 6–20)
CO2: 24 mmol/L (ref 22–32)
Calcium: 8.8 mg/dL — ABNORMAL LOW (ref 8.9–10.3)
Chloride: 98 mmol/L — ABNORMAL LOW (ref 101–111)
Creatinine, Ser: 0.85 mg/dL (ref 0.61–1.24)
GLUCOSE: 120 mg/dL — AB (ref 70–99)
POTASSIUM: 4.1 mmol/L (ref 3.5–5.1)
Sodium: 134 mmol/L — ABNORMAL LOW (ref 135–145)

## 2014-12-21 LAB — LIPASE, BLOOD: LIPASE: 28 U/L (ref 22–51)

## 2014-12-21 LAB — CBC
HEMATOCRIT: 38.7 % — AB (ref 39.0–52.0)
HEMOGLOBIN: 12.9 g/dL — AB (ref 13.0–17.0)
MCH: 30 pg (ref 26.0–34.0)
MCHC: 33.3 g/dL (ref 30.0–36.0)
MCV: 90 fL (ref 78.0–100.0)
Platelets: 204 10*3/uL (ref 150–400)
RBC: 4.3 MIL/uL (ref 4.22–5.81)
RDW: 13.4 % (ref 11.5–15.5)
WBC: 10.3 10*3/uL (ref 4.0–10.5)

## 2014-12-21 LAB — HEPATIC FUNCTION PANEL
ALT: 69 U/L — AB (ref 17–63)
AST: 57 U/L — ABNORMAL HIGH (ref 15–41)
Albumin: 3.8 g/dL (ref 3.5–5.0)
Alkaline Phosphatase: 93 U/L (ref 38–126)
BILIRUBIN DIRECT: 0.2 mg/dL (ref 0.1–0.5)
BILIRUBIN INDIRECT: 0.8 mg/dL (ref 0.3–0.9)
Total Bilirubin: 1 mg/dL (ref 0.3–1.2)
Total Protein: 6.9 g/dL (ref 6.5–8.1)

## 2014-12-21 LAB — TROPONIN I
TROPONIN I: 0.03 ng/mL (ref <0.031)
TROPONIN I: 0.03 ng/mL (ref <0.031)

## 2014-12-21 LAB — MAGNESIUM: Magnesium: 1.8 mg/dL (ref 1.7–2.4)

## 2014-12-21 LAB — I-STAT TROPONIN, ED: Troponin i, poc: 0.01 ng/mL (ref 0.00–0.08)

## 2014-12-21 LAB — BRAIN NATRIURETIC PEPTIDE: B Natriuretic Peptide: 318.2 pg/mL — ABNORMAL HIGH (ref 0.0–100.0)

## 2014-12-21 MED ORDER — SODIUM CHLORIDE 0.9 % IV SOLN: 250.0000 mL | INTRAVENOUS | Status: DC | PRN

## 2014-12-21 MED ORDER — PANTOPRAZOLE SODIUM 40 MG PO TBEC
80.0000 mg | DELAYED_RELEASE_TABLET | Freq: Every day | ORAL | Status: DC
Start: 2014-12-22 — End: 2014-12-24
  Administered 2014-12-22 – 2014-12-24 (×3): 80 mg via ORAL
  Filled 2014-12-21 (×4): qty 2

## 2014-12-21 MED ORDER — IOHEXOL 350 MG/ML SOLN
100.0000 mL | Freq: Once | INTRAVENOUS | Status: AC | PRN
  Administered 2014-12-21: 100 mL via INTRAVENOUS

## 2014-12-21 MED ORDER — PNEUMOCOCCAL VAC POLYVALENT 25 MCG/0.5ML IJ INJ
0.5000 mL | INJECTION | INTRAMUSCULAR | Status: AC
  Administered 2014-12-22: 0.5 mL via INTRAMUSCULAR
  Filled 2014-12-21: qty 0.5

## 2014-12-21 MED ORDER — METOPROLOL TARTRATE 25 MG PO TABS
25.0000 mg | ORAL_TABLET | Freq: Two times a day (BID) | ORAL | Status: DC
  Administered 2014-12-21 – 2014-12-22 (×2): 25 mg via ORAL
  Filled 2014-12-21 (×4): qty 1

## 2014-12-21 MED ORDER — SODIUM CHLORIDE 0.9 % IJ SOLN: 3.0000 mL | INTRAMUSCULAR | Status: DC | PRN

## 2014-12-21 MED ORDER — LEVOFLOXACIN IN D5W 750 MG/150ML IV SOLN
750.0000 mg | INTRAVENOUS | Status: DC
  Administered 2014-12-21: 750 mg via INTRAVENOUS
  Filled 2014-12-21 (×2): qty 150

## 2014-12-21 MED ORDER — IPRATROPIUM-ALBUTEROL 0.5-2.5 (3) MG/3ML IN SOLN
3.0000 mL | Freq: Once | RESPIRATORY_TRACT | Status: AC
  Administered 2014-12-21: 3 mL via RESPIRATORY_TRACT
  Filled 2014-12-21: qty 3

## 2014-12-21 MED ORDER — APIXABAN 5 MG PO TABS
5.0000 mg | ORAL_TABLET | Freq: Two times a day (BID) | ORAL | Status: DC
  Administered 2014-12-21 – 2014-12-24 (×6): 5 mg via ORAL
  Filled 2014-12-21 (×7): qty 1

## 2014-12-21 MED ORDER — ZOLPIDEM TARTRATE 5 MG PO TABS
5.0000 mg | ORAL_TABLET | Freq: Once | ORAL | Status: AC
  Administered 2014-12-21: 5 mg via ORAL
  Filled 2014-12-21: qty 1

## 2014-12-21 MED ORDER — CLORAZEPATE DIPOTASSIUM 3.75 MG PO TABS
7.5000 mg | ORAL_TABLET | Freq: Every day | ORAL | Status: DC
  Administered 2014-12-21 – 2014-12-23 (×3): 7.5 mg via ORAL
  Filled 2014-12-21 (×3): qty 2

## 2014-12-21 MED ORDER — ALBUTEROL SULFATE (2.5 MG/3ML) 0.083% IN NEBU: 2.5000 mg | INHALATION_SOLUTION | Freq: Four times a day (QID) | RESPIRATORY_TRACT | Status: DC | PRN

## 2014-12-21 MED ORDER — DOFETILIDE 500 MCG PO CAPS
500.0000 ug | ORAL_CAPSULE | Freq: Two times a day (BID) | ORAL | Status: DC
  Administered 2014-12-21 – 2014-12-24 (×6): 500 ug via ORAL
  Filled 2014-12-21 (×9): qty 1

## 2014-12-21 MED ORDER — SODIUM CHLORIDE 0.9 % IJ SOLN
3.0000 mL | Freq: Two times a day (BID) | INTRAMUSCULAR | Status: DC
  Administered 2014-12-21 – 2014-12-24 (×6): 3 mL via INTRAVENOUS

## 2014-12-21 MED ORDER — POTASSIUM CHLORIDE CRYS ER 20 MEQ PO TBCR
20.0000 meq | EXTENDED_RELEASE_TABLET | Freq: Once | ORAL | Status: AC
  Administered 2014-12-21: 20 meq via ORAL
  Filled 2014-12-21: qty 1

## 2014-12-21 MED ORDER — FUROSEMIDE 10 MG/ML IJ SOLN
40.0000 mg | Freq: Once | INTRAMUSCULAR | Status: AC
  Administered 2014-12-21: 40 mg via INTRAVENOUS
  Filled 2014-12-21: qty 4

## 2014-12-21 MED ORDER — CHOLESTYRAMINE 4 G PO PACK
8.0000 g | PACK | Freq: Every day | ORAL | Status: DC
  Administered 2014-12-22 – 2014-12-24 (×3): 8 g via ORAL
  Filled 2014-12-21 (×4): qty 2

## 2014-12-21 MED ORDER — CITALOPRAM HYDROBROMIDE 10 MG PO TABS
10.0000 mg | ORAL_TABLET | Freq: Every day | ORAL | Status: DC
Start: 2014-12-21 — End: 2014-12-24
  Administered 2014-12-21 – 2014-12-23 (×3): 10 mg via ORAL
  Filled 2014-12-21 (×5): qty 1

## 2014-12-21 NOTE — ED Notes (Signed)
Pt reports SOB for the last 24 hours. Pt states that it is worse with exertion. Denies pain. Pt noted to have spo2 at 90% room air. Placed on 3L increased to 93%.

## 2014-12-21 NOTE — H&P (Signed)
History and Physical  TAKAI CHIARAMONTE BWI:[id number] DOB: 03/27/1961 DOA: 12/21/2014  Referring physician: Cecil Cranker, ER physician PCP: [name], MD   Chief Complaint: Shortness of breath  HPI: Harold Scott is a 54 y.o. male  Past mental history of obesity, sleep apnea and paroxysmal atrial fibrillation who overall is in good health and has had no medical issues and at his baseline, is able to participate in all activities of daily living without any assistance or supplement oxygen. Patient noted yesterday, 5/2, that was sitting down at lunch felt quite winded and was breathing quite hard. He had no fever, no cough no chest pain or other symptoms. When his symptoms continue to persist into today, 5/3, he came into the emergency room for further evaluation. Patient's blood work was notable for a minimal elevation of his BNP at 318 and a chest x-ray noted no pulmonary edema or infiltrate, but noted a particular mild interstitial prominent pattern in the bilateral lower lobes which was initially read as chronic. He denies underwent a CT scan to rule out PE which noted no pulmonary was, but did comment further on findings of ground glass opacities and reticular nodular pattern in the mid and lower lung lobes bilaterally, possibly inflammatory. Patient was also noted to be having oxygen saturation of only 93% on room air but with activity drop down to 87%. Patient was put on 2-3 L nasal cannula and oxygen saturations improved to 95% even with activity. After breathing treatment, he felt better. Hospitalists were called for further evaluation and admission.   Review of Systems:  Patient seen after arrival to floor Pt complains of some shortness of breath and dyspnea, although improved with oxygen and after breathing treatments.  Pt denies any headaches, vision changes, dysphagia, chest pain, palpitations, wheezing, coughing, abdominal pain, hematuria, dysuria, constipation, diarrhea, focal  extremity numbness weakness or pain.  Review of systems are otherwise negative  Past Medical History  Diagnosis Date  . Anxiety   . Depression   . GERD (gastroesophageal reflux disease)   . Gallstones   . Asthma   . PONV (postoperative nausea and vomiting)   . Paroxysmal atrial fibrillation     a.  previously intolerant of Flecainide and Multaq b. PVI by Dr [name] 09/21/14  . Obstructive sleep apnea     a. on CPAP, followed by Clance   Past Surgical History  Procedure Laterality Date  . Hand surgery    . Tonsillectomy    . Wisdom tooth extraction    . Cholecystectomy  08/05/2012    Procedure: LAPAROSCOPIC CHOLECYSTECTOMY WITH INTRAOPERATIVE CHOLANGIOGRAM;  Surgeon: [name], MD,FACS;  Location: [place];  Service: General;  Laterality: N/A;  . Laparoscopic appendectomy N/A 02/13/2014    Procedure: APPENDECTOMY LAPAROSCOPIC;  Surgeon: [name], MD;  Location: [facility];  Service: General;  Laterality: N/A;  . Tee without cardioversion N/A 09/20/2014    Procedure: TRANSESOPHAGEAL ECHOCARDIOGRAM (TEE);  Surgeon: [name], MD;  Location: [place];  Service: Cardiovascular;  Laterality: N/A;  . Ablation  09/21/14    PVI by Dr [name]  . Atrial fibrillation ablation N/A 09/21/2014    Procedure: ATRIAL FIBRILLATION ABLATION;  Surgeon: [name], MD;  Location: [facility] CATH LAB;  Service: Cardiovascular;  Laterality: N/A;   Social History:  reports that he quit smoking about 8 years ago. His smoking use included Cigarettes. He has a 15 pack-year smoking history. He has never used smokeless tobacco. He reports that he drinks  about 4.2 - 8.4 oz of alcohol per week. He reports that he does not use illicit drugs. Patient lives at home with his wife and is able to participate in activities of daily living usually without any assistance  Allergies  Allergen Reactions  . Flecainide Other (See Comments)    EXTREME SOB  . Multaq [Dronedarone] Shortness Of Breath  . Penicillins Hives     Family History  Problem Relation Age of Onset  . Adopted: Yes    Patient states that he is adopted and does not know about any medical problems that run in his family  Prior to Admission medications   Medication Sig Start Date End Date Taking? Authorizing Provider  albuterol (PROVENTIL HFA;VENTOLIN HFA) 108 (90 BASE) MCG/ACT inhaler Inhale 1-2 puffs into the lungs every 6 (six) hours as needed for wheezing or shortness of breath.   Yes Historical Provider, MD  cholestyramine Lucrezia Starch) 4 G packet TAKE 2 PACKETS DAILY Patient taking differently: TAKE 2 PACKETS BY MOUTH DAILY 06/03/14  Yes Dorena Cookey, MD  citalopram (CELEXA) 10 MG tablet TAKE 1 TABLET BY MOUTH ONCE DAILY 12/15/14  Yes Dorena Cookey, MD  clorazepate (TRANXENE) 7.5 MG tablet Take 1 tablet (7.5 mg total) by mouth at bedtime. 07/09/14  Yes Dorena Cookey, MD  dofetilide (TIKOSYN) 500 MCG capsule Take 1 capsule (500 mcg total) by mouth 2 (two) times daily. 07/23/14  Yes Rhonda G Barrett, PA-C  ELIQUIS 5 MG TABS tablet TAKE 1 TABLET TWICE A DAY 11/17/14  Yes [name], MD  esomeprazole (NEXIUM) 40 MG capsule Take 40 mg by mouth daily at 12 noon.   Yes Historical Provider, MD  guaiFENesin 200 MG tablet Take 200 mg by mouth 2 (two) times daily as needed (for cold).   Yes Historical Provider, MD  ibuprofen (ADVIL,MOTRIN) 200 MG tablet Take 400 mg by mouth every 6 (six) hours as needed. For pain   Yes Historical Provider, MD  losartan (COZAAR) 50 MG tablet TAKE 1 TABLET (50 MG TOTAL) BY MOUTH DAILY. 10/28/14  Yes [name], MD  metoprolol (LOPRESSOR) 50 MG tablet Take 1 tablet (50 mg total) by mouth 2 (two) times daily. 12/16/14  Yes [name], MD  simvastatin (ZOCOR) 20 MG tablet Take 1 tablet (20 mg total) by mouth daily at 6 PM. 07/23/14  Yes Lonn Georgia, PA-C    Physical Exam: BP 125/66 mmHg  Pulse 65  Temp(Src) 97.9 F (36.6 C) (Oral)  Resp 18  Ht '5\' 9"'  (1.753 m)  Wt 128.187 kg (282 lb 9.6 oz)  BMI 41.71  kg/m2  SpO2 97%  General:  Alert and oriented 3, no acute distress Eyes: Sclera nonicteric, extraocular movements are intact ENT: Normocephalic, atraumatic, mucous members are moist Neck: No JVD Cardiovascular: Regular rate and rhythm, W4-Y6, 2/6 systolic ejection murmur Respiratory: Decreased breath sounds bibasilar Abdomen: Soft, obese, nontender, positive bowel sounds Skin: No skin breaks, tears or lesions Musculoskeletal: No clubbing or cyanosis or edema Psychiatric: Patient is appropriate, no evidence of psychoses Neurologic: No focal deficits           Labs on Admission:  Basic Metabolic Panel:  Recent Labs Lab 12/21/14 1035 12/21/14 2005  NA 134*  --   K 4.1  --   CL 98*  --   CO2 24  --   GLUCOSE 120*  --   BUN 9  --   CREATININE 0.85  --   CALCIUM 8.8*  --   MG  --  1.8   Liver Function Tests:  Recent Labs Lab 12/21/14 1205  AST 57*  ALT 69*  ALKPHOS 93  BILITOT 1.0  PROT 6.9  ALBUMIN 3.8    Recent Labs Lab 12/21/14 1205  LIPASE 28   No results for input(s): AMMONIA in the last 168 hours. CBC:  Recent Labs Lab 12/21/14 1035  WBC 10.3  HGB 12.9*  HCT 38.7*  MCV 90.0  PLT 204   Cardiac Enzymes:  Recent Labs Lab 12/21/14 2005  TROPONINI 0.03    BNP (last 3 results)  Recent Labs  12/21/14 1035  BNP 318.2*    ProBNP (last 3 results)  Recent Labs  07/18/14 2156  PROBNP 1325.0*    CBG: No results for input(s): GLUCAP in the last 168 hours.  Radiological Exams on Admission: Dg Chest 2 View  12/21/2014   CLINICAL DATA:  Shortness of breath, asthma, history of atrial fibrillation 07/18/2014  EXAM: CHEST  2 VIEW  COMPARISON:  07/18/2014  FINDINGS: Cardiomediastinal silhouette is stable. No acute infiltrate or pulmonary edema. There is reticular mild interstitial prominence bilateral lower lobes probable chronic in nature. Mild degenerative changes thoracic spine.  IMPRESSION: No segmental infiltrate or pulmonary edema.  Reticular mild interstitial prominent bilateral lower lobes probable chronic in nature. Mild degenerative changes thoracic spine.   Electronically Signed   By: Lahoma Crocker M.D.   On: 12/21/2014 11:49   Ct Angio Chest Pe W/cm &/or Wo Cm  12/21/2014   CLINICAL DATA:  Shortness of breath for 24 hours.  EXAM: CT ANGIOGRAPHY CHEST WITH CONTRAST  TECHNIQUE: Multidetector CT imaging of the chest was performed using the standard protocol during bolus administration of intravenous contrast. Multiplanar CT image reconstructions and MIPs were obtained to evaluate the vascular anatomy.  CONTRAST:  11m OMNIPAQUE IOHEXOL 350 MG/ML SOLN  COMPARISON:  None.  FINDINGS: CT CHEST FINDINGS  Mediastinum/Nodes: Heart is borderline in size. Scattered coronary artery calcifications within the left anterior descending coronary artery, left circumflex and right coronary artery. No mediastinal, hilar, or axillary adenopathy.  Lungs/Pleura: Scattered ground-glass opacities with reticulonodular densities in the mid and lower lung zones. No confluent airspace opacities. No effusions.  Musculoskeletal: No acute bony abnormality or focal bone lesion. Chest wall soft tissues are unremarkable.  Upper abdomen: Diffuse fatty infiltration of the liver. Prior cholecystectomy. No acute findings in the upper abdomen.  Review of the MIP images confirms the above findings.  IMPRESSION: Reticulonodular and ground-glass opacities in the mid and lower lung zones, most likely inflammatory.  No evidence of pulmonary embolus.  Coronary artery disease.  Fatty liver.   Electronically Signed   By: KRolm BaptiseM.D.   On: 12/21/2014 13:23    EKG: Independently reviewed. Prolonged QT, incomplete bundle branch block, sinus rhythm. No change from previous  Assessment/Plan Present on Admission:  . acute respiratory failure with hypoxia: Etiology unclear. Abnormal finding of pulmonary infiltrates on CT scan, but not typical pneumonia.? Infectious. Have spoken  with pulmonary. Checking pro calcitonin level. Right now using IV Levaquin to cover for atypical pneumonia. Cardiology following, but in agreement that this is not just pulmonary edema. Checking autoimmune etiologies including ESR, ANCA  . PAF (paroxysmal atrial fibrillation): Rate controlled and currently normal sinus rhythm.  . Essential hypertension . Acute on chronic diastolic CHF (congestive heart failure): Mild, underlying issue is more pulmonary-infectious versus inflammatory   . Morbid obesity: Patient meets criteria with BMI greater than 40  . Obstructive sleep apnea: Continue nightly CPap  Consultants:  Cardiology Pulmonary  Code Status: Full code  Family Communication: Left message his wife   Disposition Plan: Likely her for several days, while we determine diagnosis and better stabilized  Time spent: 77 minutes  [facility] [facility] Pager [phone]

## 2014-12-21 NOTE — Telephone Encounter (Signed)
Patient called in stating since yesterday afternoon he has been noticing an increase in shortness of breath and feels like his abdomen is "bloated" and just cannot catch his breath.  Having difficulty doing normal activities without increased shortness of breath.  He tried using his albuterol inhaler he has for asthma (although he is not wheezing) but this had little affect.  He does not have a scale at home to weigh himself nor does not notice swelling in lower extremities. States his blood pressure and heart rate have been controlled since increasing his metoprolol last week for afib burst. Stated he did not sleep well last night because of this shortness of breath. He states his health is normal otherwise no cold, virus or OTC medications used recently.  Offered patient to be seen by Flex NP/PA today/tomorrow -- upon calling patient back to set up appointment pt was so short of breath from walking 20 feet he could not maintain a complete conversation due to the shortness of breath. Advised patient with that extreme shortness of breath he should go to the emergency room patient was agreeable to this and stated he would go to the emergency room to be assessed.

## 2014-12-21 NOTE — Progress Notes (Signed)
ANTIBIOTIC CONSULT NOTE - INITIAL  Pharmacy Consult for Levofloxacin Indication: pneumonia  Allergies  Allergen Reactions  . Flecainide Other (See Comments)    EXTREME SOB  . Multaq [Dronedarone] Shortness Of Breath  . Penicillins Hives    Patient Measurements: Height: 5\' 9"  (175.3 cm) Weight: 281 lb 12 oz (127.8 kg) IBW/kg (Calculated) : 70.7 Adjusted Body Weight:   Vital Signs: Temp: 98.6 F (37 C) (05/03 1735) Temp Source: Oral (05/03 1735) BP: 148/61 mmHg (05/03 1735) Pulse Rate: 62 (05/03 1735) Intake/Output from previous day:   Intake/Output from this shift:    Labs:  Recent Labs  12/21/14 1035  WBC 10.3  HGB 12.9*  PLT 204  CREATININE 0.85   Estimated Creatinine Clearance: 132.9 mL/min (by C-G formula based on Cr of 0.85). No results for input(s): VANCOTROUGH, VANCOPEAK, VANCORANDOM, GENTTROUGH, GENTPEAK, GENTRANDOM, TOBRATROUGH, TOBRAPEAK, TOBRARND, AMIKACINPEAK, AMIKACINTROU, AMIKACIN in the last 72 hours.   Microbiology: No results found for this or any previous visit (from the past 720 hour(s)).  Medical History: Past Medical History  Diagnosis Date  . Anxiety   . Depression   . GERD (gastroesophageal reflux disease)   . Gallstones   . Asthma   . PONV (postoperative nausea and vomiting)   . Paroxysmal atrial fibrillation     a.  previously intolerant of Flecainide and Multaq b. PVI by Dr [name] 09/21/14  . Obstructive sleep apnea     a. on CPAP, followed by [facility]    Medications:  Scheduled:  . apixaban  5 mg Oral BID  . [START ON 12/22/2014] cholestyramine  8 g Oral QAC breakfast  . citalopram  10 mg Oral Daily  . clorazepate  7.5 mg Oral QHS  . dofetilide  500 mcg Oral BID  . metoprolol tartrate  25 mg Oral BID  . [START ON 12/22/2014] pantoprazole  80 mg Oral Q1200  . [START ON 12/22/2014] pneumococcal 23 valent vaccine  0.5 mL Intramuscular Tomorrow-1000  . sodium chloride  3 mL Intravenous Q12H   Assessment: 54yo male presenting to the  ED earlier today with increasing SOB, to start Levofloxacin for possible CAP.  His Cr < 1, WBC are wnl, and he is afebrile.  CT was (-)PE.    Pt is also continuing Apixaban 5mg  po BID and Dofetilide 570mcg po BID from home, for AFib.  Of note, Levofloxacin with Dofetilide has the chance of prolonging the QTc interval, which is 488sec on today's EKG.  This will be monitored prior to each Dofetilide dose.   Goal of Therapy:  resolution of infection  Plan:  Levofloxacin 750mg  IV daily Monitor QTc and consider changing to alternate antibiotic if increases F/u any culture data  Gracy Bruins, [facility]

## 2014-12-21 NOTE — ED Provider Notes (Signed)
CSN: [id number]     Arrival date & time 12/21/14  1011 History   First MD Initiated Contact with Patient 12/21/14 1019     Chief Complaint  Patient presents with  . Shortness of Breath     (Consider location/radiation/quality/duration/timing/severity/associated sxs/prior Treatment) HPI Patient states he has a history of paroxysmal H of fibrillation. It has recently been controlled on his medications. He also has had an ablation. He reports he drives a lot for work. Yesterday before he was getting ready to have lunch, he became very short of breath very quickly. He states he got a bloating sensation in his abdomen prior to eating and with that the feeling of not being able to breathe comfortably. He denies there was any associated pain specifically. He had no chest pain. He went into his vehicle and sat down and it took him a few minutes to start feeling like that shortness of breath was abating somewhat. The shortness of breath however has persisted since that time. At rest he feels mildly short of breath but with any exertion he becomes very short of breath. Been no cough, no fever, no leg swelling or calf pain. He denies any prior history of PE or DVT. He reports rarely he has had an inhaler to use but denies he has any history of asthma or COPD. He is a nonsmoker. Past Medical History  Diagnosis Date  . Anxiety   . Depression   . GERD (gastroesophageal reflux disease)   . Gallstones   . Asthma   . PONV (postoperative nausea and vomiting)   . Paroxysmal atrial fibrillation     a.  previously intolerant of Flecainide and Multaq b. PVI by Dr [name] 09/21/14  . Obstructive sleep apnea     a. on CPAP, followed by [facility]   Past Surgical History  Procedure Laterality Date  . Hand surgery    . Tonsillectomy    . Wisdom tooth extraction    . Cholecystectomy  08/05/2012    Procedure: LAPAROSCOPIC CHOLECYSTECTOMY WITH INTRAOPERATIVE CHOLANGIOGRAM;  Surgeon: [name], MD,FACS;  Location: [place];  Service: General;  Laterality: N/A;  . Laparoscopic appendectomy N/A 02/13/2014    Procedure: APPENDECTOMY LAPAROSCOPIC;  Surgeon: [name], MD;  Location: [facility];  Service: General;  Laterality: N/A;  . Tee without cardioversion N/A 09/20/2014    Procedure: TRANSESOPHAGEAL ECHOCARDIOGRAM (TEE);  Surgeon: [name], MD;  Location: [place];  Service: Cardiovascular;  Laterality: N/A;  . Ablation  09/21/14    PVI by Dr [name]  . Atrial fibrillation ablation N/A 09/21/2014    Procedure: ATRIAL FIBRILLATION ABLATION;  Surgeon: [name], MD;  Location: [facility] CATH LAB;  Service: Cardiovascular;  Laterality: N/A;   Family History  Problem Relation Age of Onset  . Adopted: Yes   History  Substance Use Topics  . Smoking status: Former Smoker -- 0.50 packs/day for 30 years    Types: Cigarettes    Quit date: 07/23/2006  . Smokeless tobacco: Never Used  . Alcohol Use: 4.2 - 8.4 oz/week    7-14 Standard drinks or equivalent per week     Comment: social    Review of Systems   10 Systems reviewed and are negative for acute change except as noted in the HPI.  Allergies  Flecainide; Multaq; and Penicillins  Home Medications   Prior to Admission medications   Medication Sig Start Date End Date Taking? Authorizing Provider  albuterol (PROVENTIL HFA;VENTOLIN HFA) 108 (90 BASE)  MCG/ACT inhaler Inhale 1-2 puffs into the lungs every 6 (six) hours as needed for wheezing or shortness of breath.   Yes Historical Provider, MD  cholestyramine Lucrezia Starch) 4 G packet TAKE 2 PACKETS DAILY Patient taking differently: TAKE 2 PACKETS BY MOUTH DAILY 06/03/14  Yes Dorena Cookey, MD  citalopram (CELEXA) 10 MG tablet TAKE 1 TABLET BY MOUTH ONCE DAILY 12/15/14  Yes Dorena Cookey, MD  clorazepate (TRANXENE) 7.5 MG tablet Take 1 tablet (7.5 mg total) by mouth at bedtime. 07/09/14  Yes Dorena Cookey, MD  dofetilide (TIKOSYN) 500 MCG capsule Take 1 capsule (500 mcg total) by mouth 2 (two) times  daily. 07/23/14  Yes Rhonda G Barrett, PA-C  ELIQUIS 5 MG TABS tablet TAKE 1 TABLET TWICE A DAY 11/17/14  Yes [name], MD  esomeprazole (NEXIUM) 40 MG capsule Take 40 mg by mouth daily at 12 noon.   Yes Historical Provider, MD  guaiFENesin 200 MG tablet Take 200 mg by mouth 2 (two) times daily as needed (for cold).   Yes Historical Provider, MD  ibuprofen (ADVIL,MOTRIN) 200 MG tablet Take 400 mg by mouth every 6 (six) hours as needed. For pain   Yes Historical Provider, MD  losartan (COZAAR) 50 MG tablet TAKE 1 TABLET (50 MG TOTAL) BY MOUTH DAILY. 10/28/14  Yes [name], MD  metoprolol (LOPRESSOR) 50 MG tablet Take 1 tablet (50 mg total) by mouth 2 (two) times daily. 12/16/14  Yes [name], MD  simvastatin (ZOCOR) 20 MG tablet Take 1 tablet (20 mg total) by mouth daily at 6 PM. 07/23/14  Yes Rhonda G Barrett, PA-C   BP 136/79 mmHg  Pulse 61  Temp(Src) 98.3 F (36.8 C) (Oral)  Resp 22  SpO2 95% Physical Exam  Constitutional: He is oriented to person, place, and time. He appears well-developed and well-nourished.  Patient is alert and nontoxic. He has mild tachypnea at rest. He is not in acute significant respiratory distress. His color is good her mental status is normal.  HENT:  Head: Normocephalic and atraumatic.  Eyes: EOM are normal. Pupils are equal, round, and reactive to light.  Neck: Neck supple.  Cardiovascular: Normal rate, regular rhythm, normal heart sounds and intact distal pulses.   Pulmonary/Chest:  Patient has mild tachypnea. There are faint basilar rail's. Adequate air flow throughout the lung fields.  Abdominal: Soft. Bowel sounds are normal. He exhibits no distension. There is no tenderness.  Musculoskeletal: Normal range of motion. He exhibits edema. He exhibits no tenderness.  Patient has trace pitting edema. His calves are soft and nontender. The popliteal fossae are nontender.  Neurological: He is alert and oriented to person, place, and time. He has normal  strength. No cranial nerve deficit. He exhibits normal muscle tone. Coordination normal. GCS eye subscore is 4. GCS verbal subscore is 5. GCS motor subscore is 6.  Skin: Skin is warm, dry and intact.  Psychiatric: He has a normal mood and affect.    ED Course  Procedures (including critical care time) Labs Review Labs Reviewed  CBC - Abnormal; Notable for the following:    Hemoglobin 12.9 (*)    HCT 38.7 (*)    All other components within normal limits  BASIC METABOLIC PANEL - Abnormal; Notable for the following:    Sodium 134 (*)    Chloride 98 (*)    Glucose, Bld 120 (*)    Calcium 8.8 (*)    All other components within normal limits  BRAIN NATRIURETIC PEPTIDE -  Abnormal; Notable for the following:    B Natriuretic Peptide 318.2 (*)    All other components within normal limits  HEPATIC FUNCTION PANEL - Abnormal; Notable for the following:    AST 57 (*)    ALT 69 (*)    All other components within normal limits  LIPASE, BLOOD  I-STAT TROPOININ, ED    Imaging Review Dg Chest 2 View  12/21/2014   CLINICAL DATA:  Shortness of breath, asthma, history of atrial fibrillation 07/18/2014  EXAM: CHEST  2 VIEW  COMPARISON:  07/18/2014  FINDINGS: Cardiomediastinal silhouette is stable. No acute infiltrate or pulmonary edema. There is reticular mild interstitial prominence bilateral lower lobes probable chronic in nature. Mild degenerative changes thoracic spine.  IMPRESSION: No segmental infiltrate or pulmonary edema. Reticular mild interstitial prominent bilateral lower lobes probable chronic in nature. Mild degenerative changes thoracic spine.   Electronically Signed   By: Lahoma Crocker M.D.   On: 12/21/2014 11:49   Ct Angio Chest Pe W/cm &/or Wo Cm  12/21/2014   CLINICAL DATA:  Shortness of breath for 24 hours.  EXAM: CT ANGIOGRAPHY CHEST WITH CONTRAST  TECHNIQUE: Multidetector CT imaging of the chest was performed using the standard protocol during bolus administration of intravenous  contrast. Multiplanar CT image reconstructions and MIPs were obtained to evaluate the vascular anatomy.  CONTRAST:  160mL OMNIPAQUE IOHEXOL 350 MG/ML SOLN  COMPARISON:  None.  FINDINGS: CT CHEST FINDINGS  Mediastinum/Nodes: Heart is borderline in size. Scattered coronary artery calcifications within the left anterior descending coronary artery, left circumflex and right coronary artery. No mediastinal, hilar, or axillary adenopathy.  Lungs/Pleura: Scattered ground-glass opacities with reticulonodular densities in the mid and lower lung zones. No confluent airspace opacities. No effusions.  Musculoskeletal: No acute bony abnormality or focal bone lesion. Chest wall soft tissues are unremarkable.  Upper abdomen: Diffuse fatty infiltration of the liver. Prior cholecystectomy. No acute findings in the upper abdomen.  Review of the MIP images confirms the above findings.  IMPRESSION: Reticulonodular and ground-glass opacities in the mid and lower lung zones, most likely inflammatory.  No evidence of pulmonary embolus.  Coronary artery disease.  Fatty liver.   Electronically Signed   By: Rolm Baptise M.D.   On: 12/21/2014 13:23     EKG Interpretation   Date/Time:  Tuesday Dec 21 2014 10:13:41 EDT Ventricular Rate:  69 PR Interval:  162 QRS Duration: 110 QT Interval:  456 QTC Calculation: 488 R Axis:   -7 Text Interpretation:  Normal sinus rhythm Biatrial enlargement Incomplete  right bundle branch block Prolonged QT Abnormal ECG agree. no change from  old. Confirmed by Johnney Killian, MD, Jeannie Done [phone]) on 12/21/2014 10:23:25 AM     Consult: Cardiology was consults it for patient's history of A. fib. MDM   Final diagnoses:  Hypoxia  Pulmonary infiltrates  Paroxysmal atrial fibrillation   The patient presents today with dyspnea of fairly acute onset. Coming into the emergency department patient was 90% on room air and does not is a baseline have hypoxia. Pulmonary embolus study was negative for clot.  Identified however was groundglass appearance of bilateral infiltrate. The patient has not had fever, cough or leukocytosis. At this time it will have to be further differentiated if this represents a pulmonary fibrotic condition possibly secondary to medication or infectious etiology. Based on the patient's presentation, I did not feel that him. Pneumonia treatment needed to be initiated in the emergency department. He will be admitted for further evaluation and treatment.  Charlesetta Shanks, MD 12/21/14 [phone]

## 2014-12-21 NOTE — Consult Note (Signed)
Reason for Consult: SOB and desat with exertion   Referring Physician: ER MD    PCP:  [name], MD  Primary Cardiologist:Dr. 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    Chief Complaint: SOB unable to maintain conversation after walking 20 ft   HPI: 54 year old male with hx of paroxysmal atrial fibrillation, GERD, anxiety/depression. CHADS2=1. ON eLIQUIS.  Echo 12/13: Moderate LVH, focal basal hypertrophy, EF 50-55%, severe LAE.Small PFO 09/2014 on TEE.   Exercise treadmill test 1/14, then 07/2014 lexiscan myoview wit EF 51% with inf hypokinesis  "small region of mild basal inferolateral thinning consistent with probable soft tissue attenuation, cannot r/o subendocardial scar. No significant ischemia"  Also hx of OSA on CPAP.   Pt underwent  FIB ABLATION 09/21/14 since then he has had PAF.  freq episodes at times.  Yesterday he developed SOB around 11 Am.  Worse with exertion.  Similar to when on flecainide, he had his BB increased to 50 BID on Thursday last week due to PAF episodes which was double the dose.  Mild chest tightness.  Inhalers at home without help but nebulizer here has helped.  He has felt abd bloating over last 2 days which increased SOB.  No lower ext edema.    He was unable to tolerate flecainide or Multaq due to shortness of breath.   EKG with SR no acute changes QtC 48 ms, CT angio without PE.  BNP 318 and troponin 0.01   Here in ER RA SP02 92%, with ambulation down 87%, back on 02 at 3L, sp02 to 94%.    Past Medical History  Diagnosis Date  . Anxiety   . Depression   . GERD (gastroesophageal reflux disease)   . Gallstones   . Asthma   . PONV (postoperative nausea and vomiting)   . Paroxysmal atrial fibrillation     a.  previously intolerant of Flecainide and Multaq b. PVI by Dr [name] 09/21/14  . Obstructive sleep apnea     a. on CPAP, followed by [facility]    Past Surgical History  Procedure Laterality Date  .  Hand surgery    . Tonsillectomy    . Wisdom tooth extraction    . Cholecystectomy  08/05/2012    Procedure: LAPAROSCOPIC CHOLECYSTECTOMY WITH INTRAOPERATIVE CHOLANGIOGRAM;  Surgeon: [name], MD,FACS;  Location: [place];  Service: General;  Laterality: N/A;  . Laparoscopic appendectomy N/A 02/13/2014    Procedure: APPENDECTOMY LAPAROSCOPIC;  Surgeon: [name], MD;  Location: [facility];  Service: General;  Laterality: N/A;  . Tee without cardioversion N/A 09/20/2014    Procedure: TRANSESOPHAGEAL ECHOCARDIOGRAM (TEE);  Surgeon: [name], MD;  Location: [place];  Service: Cardiovascular;  Laterality: N/A;  . Ablation  09/21/14    PVI by Dr [name]  . Atrial fibrillation ablation N/A 09/21/2014    Procedure: ATRIAL FIBRILLATION ABLATION;  Surgeon: [name], MD;  Location: [facility] CATH LAB;  Service: Cardiovascular;  Laterality: N/A;    Family History  Problem Relation Age of Onset  . Adopted: Yes   Social History:  reports that he quit smoking about 8 years ago. His smoking use included Cigarettes. He has a 15 pack-year smoking history. He has never used smokeless tobacco. He reports that he drinks about 4.2 - 8.4 oz of alcohol per week. He reports that he does not use illicit drugs.  Allergies:  Allergies  Allergen Reactions  . Flecainide Other (See Comments)    EXTREME SOB  . Multaq [Dronedarone] Shortness Of Breath  . Penicillins Hives    Outpatient Meds: No current facility-administered medications on file prior to encounter.   Current Outpatient Prescriptions on File Prior to Encounter  Medication Sig Dispense Refill  . cholestyramine (QUESTRAN) 4 G packet TAKE 2 PACKETS DAILY (Patient taking differently: TAKE 2 PACKETS BY MOUTH DAILY) 60 packet 10  . citalopram (CELEXA) 10 MG tablet TAKE 1 TABLET BY MOUTH ONCE DAILY 90 tablet 1  . clorazepate (TRANXENE) 7.5 MG tablet Take 1 tablet (7.5 mg total) by mouth at bedtime. 30 tablet 5  . dofetilide (TIKOSYN) 500 MCG capsule  Take 1 capsule (500 mcg total) by mouth 2 (two) times daily. 14 capsule 0  . ELIQUIS 5 MG TABS tablet TAKE 1 TABLET TWICE A DAY 60 tablet 3  . esomeprazole (NEXIUM) 40 MG capsule Take 40 mg by mouth daily at 12 noon.    Marland Kitchen guaiFENesin 200 MG tablet Take 200 mg by mouth 2 (two) times daily as needed (for cold).    Marland Kitchen ibuprofen (ADVIL,MOTRIN) 200 MG tablet Take 400 mg by mouth every 6 (six) hours as needed. For pain    . losartan (COZAAR) 50 MG tablet TAKE 1 TABLET (50 MG TOTAL) BY MOUTH DAILY. 90 tablet 0  . metoprolol (LOPRESSOR) 50 MG tablet Take 1 tablet (50 mg total) by mouth 2 (two) times daily. 90 tablet 3  . simvastatin (ZOCOR) 20 MG tablet Take 1 tablet (20 mg total) by mouth daily at 6 PM. 30 tablet 11     Results for orders placed or performed during the hospital encounter of 12/21/14 (from the past 48 hour(s))  CBC     Status: Abnormal   Collection Time: 12/21/14 10:35 AM  Result Value Ref Range   WBC 10.3 4.0 - 10.5 K/uL   RBC 4.30 4.22 - 5.81 MIL/uL   Hemoglobin 12.9 (L) 13.0 - 17.0 g/dL   HCT 38.7 (L) 39.0 - 52.0 %   MCV 90.0 78.0 - 100.0 fL   MCH 30.0 26.0 - 34.0 pg   MCHC 33.3 30.0 - 36.0 g/dL   RDW 13.4 11.5 - 15.5 %   Platelets 204 150 - 400 K/uL  Basic metabolic panel     Status: Abnormal   Collection Time: 12/21/14 10:35 AM  Result Value Ref Range   Sodium 134 (L) 135 - 145 mmol/L   Potassium 4.1 3.5 - 5.1 mmol/L   Chloride 98 (L) 101 - 111 mmol/L   CO2 24 22 - 32 mmol/L   Glucose, Bld 120 (H) 70 - 99 mg/dL   BUN 9 6 - 20 mg/dL   Creatinine, Ser 0.85 0.61 - 1.24 mg/dL   Calcium 8.8 (L) 8.9 - 10.3 mg/dL   GFR calc non Af Amer >60 >60 mL/min   GFR calc Af Amer >60 >60 mL/min    Comment: (NOTE) The eGFR has been calculated using the CKD EPI equation. This calculation has not been validated in all clinical situations. eGFR's persistently <90 mL/min signify possible Chronic Kidney Disease.    Anion gap 12 5 - 15  BNP (order ONLY if patient complains of  dyspnea/SOB AND you have documented it for THIS visit)     Status: Abnormal   Collection Time: 12/21/14 10:35 AM  Result Value Ref Range   B Natriuretic Peptide 318.2 (H) 0.0 - 100.0 pg/mL  I-stat troponin, ED  (not at [facility])     Status: None   Collection Time: 12/21/14 10:44 AM  Result Value Ref Range   Troponin i, poc 0.01 0.00 - 0.08 ng/mL   Comment 3            Comment: Due to the release kinetics of cTnI, a negative result within the first hours of the onset of symptoms does not rule out myocardial infarction with certainty. If myocardial infarction is still suspected, repeat the test at appropriate intervals.   Hepatic function panel     Status: Abnormal   Collection Time: 12/21/14 12:05 PM  Result Value Ref Range   Total Protein 6.9 6.5 - 8.1 g/dL   Albumin 3.8 3.5 - 5.0 g/dL   AST 57 (H) 15 - 41 U/L   ALT 69 (H) 17 - 63 U/L   Alkaline Phosphatase 93 38 - 126 U/L   Total Bilirubin 1.0 0.3 - 1.2 mg/dL   Bilirubin, Direct 0.2 0.1 - 0.5 mg/dL   Indirect Bilirubin 0.8 0.3 - 0.9 mg/dL  Lipase, blood     Status: None   Collection Time: 12/21/14 12:05 PM  Result Value Ref Range   Lipase 28 22 - 51 U/L   Dg Chest 2 View  12/21/2014   CLINICAL DATA:  Shortness of breath, asthma, history of atrial fibrillation 07/18/2014  EXAM: CHEST  2 VIEW  COMPARISON:  07/18/2014  FINDINGS: Cardiomediastinal silhouette is stable. No acute infiltrate or pulmonary edema. There is reticular mild interstitial prominence bilateral lower lobes probable chronic in nature. Mild degenerative changes thoracic spine.  IMPRESSION: No segmental infiltrate or pulmonary edema. Reticular mild interstitial prominent bilateral lower lobes probable chronic in nature. Mild degenerative changes thoracic spine.   Electronically Signed   By: Lahoma Crocker M.D.   On: 12/21/2014 11:49   Ct Angio Chest Pe W/cm &/or Wo Cm  12/21/2014   CLINICAL DATA:  Shortness of breath for 24 hours.  EXAM: CT ANGIOGRAPHY CHEST WITH  CONTRAST  TECHNIQUE: Multidetector CT imaging of the chest was performed using the standard protocol during bolus administration of intravenous contrast. Multiplanar CT image reconstructions and MIPs were obtained to evaluate the vascular anatomy.  CONTRAST:  157m OMNIPAQUE IOHEXOL 350 MG/ML SOLN  COMPARISON:  None.  FINDINGS: CT CHEST FINDINGS  Mediastinum/Nodes: Heart is borderline in size. Scattered coronary artery calcifications within the left anterior descending coronary artery, left circumflex and right coronary artery. No mediastinal, hilar, or axillary adenopathy.  Lungs/Pleura: Scattered ground-glass opacities with reticulonodular densities in the mid and lower lung zones. No confluent airspace opacities. No effusions.  Musculoskeletal: No acute bony abnormality or focal bone lesion. Chest wall soft tissues are unremarkable.  Upper abdomen: Diffuse fatty infiltration of the liver. Prior cholecystectomy. No acute findings in the upper abdomen.  Review of the MIP images confirms the above findings.  IMPRESSION: Reticulonodular and ground-glass opacities in the mid and lower lung zones, most likely inflammatory.  No evidence of pulmonary embolus.  Coronary artery disease.  Fatty liver.   Electronically Signed   By: KRolm BaptiseM.D.   On: 12/21/2014 13:23    ROS: General:no colds or fevers, no weight changes Skin:no rashes or ulcers HEENT:no blurred vision, no congestion CV:see HPI PUL:see HPI GI:no diarrhea - though he has hx of this since chole, no constipation or melena, no indigestion GU:no hematuria, no dysuria MS:no joint pain, no claudication Neuro:no syncope, some lightheadedness when he walked into ED. Endo:no diabetes, no thyroid disease   Blood pressure 133/74,  pulse 62, temperature 98.3 F (36.8 C), temperature source Oral, resp. rate 17, SpO2 94 %.  Wt Readings from Last 3 Encounters:  09/28/14 280 lb (127.007 kg)  09/21/14 279 lb 12.2 oz (126.9 kg)  09/02/14 272 lb (123.378  kg)    PE: General:Pleasant affect, NAD Skin:Warm and dry, brisk capillary refill HEENT:normocephalic, sclera clear, mucus membranes moist Neck:supple, no JVD, no bruits  Heart:S1S2 RRR without murmur, gallup, rub or click Lungs:with bibasilar rales, no rhonchi, or wheezes- exam post nebulizer KFM:MCRFV, soft, non tender, + BS, do not palpate liver spleen or masses Ext:no lower ext edema, 2+ pedal pulses, 2+ radial pulses Neuro:alert and oriented X 3, MAE, follows commands, + facial symmetry   Assessment/Plan 1. DOE with hypoxia- desat to 87%,  neg PE on CT angio,  May have broncho spasm with increased BB and he does not believe it has decreased episode of PAF.  Also with freq episodes of PAF. CT with ground glass appearance.  Will decrease lopressor to 25 BID  Will give lasix 40 mg once but pt will need pul consult as well.  We will have EP see in AM.  2. PAF with ablation in Feb.  Does have breakthrough a fib that has been bothersome to him.  3. OSA with Cpap followed by Dr. Gwenette Greet  4. Asthma has not been an issue recently.  5. Mild chest tightness, most likely due to SOB.  Check troponins.  6. Anticoagulation continue eliquis   [facility] R  Nurse Practitioner Certified [place] Pager [phone] or after 5pm or weekends call [phone] 12/21/2014, 2:33 PM

## 2014-12-21 NOTE — Progress Notes (Signed)
RT Note:  Pt will place self n/off cpap.  Rt will continue to monitor.

## 2014-12-21 NOTE — ED Notes (Signed)
Ambulated pt on room air with pulse ox, O2sat initial 92%ra, while ambulating pt had increased SOB, and O2sat decreased to 87%ra. Pt placed back on Oxygen at 3LPM via nasal cannula and O2sat increased back to 94%

## 2014-12-22 ENCOUNTER — Ambulatory Visit (HOSPITAL_BASED_OUTPATIENT_CLINIC_OR_DEPARTMENT_OTHER): Payer: 59

## 2014-12-22 ENCOUNTER — Other Ambulatory Visit: Payer: Self-pay | Admitting: Physician Assistant

## 2014-12-22 DIAGNOSIS — J9601 Acute respiratory failure with hypoxia: Secondary | ICD-10-CM

## 2014-12-22 DIAGNOSIS — R0600 Dyspnea, unspecified: Secondary | ICD-10-CM

## 2014-12-22 LAB — ANCA TITERS: P-ANCA: <1:20 {titer}

## 2014-12-22 LAB — HIV ANTIBODY (ROUTINE TESTING W REFLEX): HIV Screen 4th Generation wRfx: NONREACTIVE

## 2014-12-22 LAB — MPO/PR-3 (ANCA) ANTIBODIES: ANCA Proteinase 3: <3.5 U/mL (ref 0.0–3.5)

## 2014-12-22 LAB — BASIC METABOLIC PANEL
ANION GAP: 11 (ref 5–15)
BUN: 8 mg/dL (ref 6–20)
CO2: 28 mmol/L (ref 22–32)
CREATININE: 0.84 mg/dL (ref 0.61–1.24)
Calcium: 8.8 mg/dL — ABNORMAL LOW (ref 8.9–10.3)
Chloride: 95 mmol/L — ABNORMAL LOW (ref 101–111)
Glucose, Bld: 138 mg/dL — ABNORMAL HIGH (ref 70–99)
POTASSIUM: 3.6 mmol/L (ref 3.5–5.1)
Sodium: 134 mmol/L — ABNORMAL LOW (ref 135–145)

## 2014-12-22 LAB — STREP PNEUMONIAE URINARY ANTIGEN: STREP PNEUMO URINARY ANTIGEN: NEGATIVE

## 2014-12-22 LAB — CBC
HEMATOCRIT: 39.6 % (ref 39.0–52.0)
Hemoglobin: 13.1 g/dL (ref 13.0–17.0)
MCH: 30.1 pg (ref 26.0–34.0)
MCHC: 33.1 g/dL (ref 30.0–36.0)
MCV: 91 fL (ref 78.0–100.0)
PLATELETS: 190 10*3/uL (ref 150–400)
RBC: 4.35 MIL/uL (ref 4.22–5.81)
RDW: 13.5 % (ref 11.5–15.5)
WBC: 9 10*3/uL (ref 4.0–10.5)

## 2014-12-22 LAB — MAGNESIUM: MAGNESIUM: 1.8 mg/dL (ref 1.7–2.4)

## 2014-12-22 LAB — PROCALCITONIN: Procalcitonin: <0.1 ng/mL

## 2014-12-22 LAB — TROPONIN I: Troponin I: <0.03 ng/mL (ref <0.031)

## 2014-12-22 MED ORDER — METOPROLOL TARTRATE 50 MG PO TABS: 50.0000 mg | ORAL_TABLET | Freq: Two times a day (BID) | ORAL | Status: DC

## 2014-12-22 MED ORDER — FUROSEMIDE 10 MG/ML IJ SOLN
40.0000 mg | Freq: Two times a day (BID) | INTRAMUSCULAR | Status: DC
  Administered 2014-12-22 – 2014-12-23 (×2): 40 mg via INTRAVENOUS
  Filled 2014-12-22 (×3): qty 4

## 2014-12-22 MED ORDER — POTASSIUM CHLORIDE CRYS ER 20 MEQ PO TBCR
40.0000 meq | EXTENDED_RELEASE_TABLET | Freq: Once | ORAL | Status: AC
  Administered 2014-12-22: 40 meq via ORAL
  Filled 2014-12-22: qty 2

## 2014-12-22 MED ORDER — METOPROLOL SUCCINATE ER 25 MG PO TB24
25.0000 mg | ORAL_TABLET | Freq: Two times a day (BID) | ORAL | Status: DC
  Administered 2014-12-22 – 2014-12-24 (×5): 25 mg via ORAL
  Filled 2014-12-22 (×6): qty 1

## 2014-12-22 MED ORDER — PERFLUTREN LIPID MICROSPHERE
1.0000 mL | INTRAVENOUS | Status: AC | PRN
  Administered 2014-12-22: 4 mL via INTRAVENOUS
  Filled 2014-12-22: qty 10

## 2014-12-22 NOTE — Progress Notes (Signed)
Patient Name: Harold Scott      SUBJECTIVE:hx of PAF with RFCA 2/16 and post operative frequent recurrences despite dofetilitde assoc with RVR and now presenting wht SOB and DOE with edema abd distension and orthopnea  Tel recurent AFIb with RVR although not notably in AFib at the time of decompensation  Much better today following diuresis  Home med is metoprolol succ  Past Medical History  Diagnosis Date  . Anxiety   . Depression   . GERD (gastroesophageal reflux disease)   . Gallstones   . Asthma   . PONV (postoperative nausea and vomiting)   . Paroxysmal atrial fibrillation     a.  previously intolerant of Flecainide and Multaq b. PVI by Dr [name] 09/21/14  . Obstructive sleep apnea     a. on CPAP, followed by [facility]    Scheduled Meds:  Scheduled Meds: . apixaban  5 mg Oral BID  . cholestyramine  8 g Oral QAC breakfast  . citalopram  10 mg Oral Daily  . clorazepate  7.5 mg Oral QHS  . dofetilide  500 mcg Oral BID  . levofloxacin (LEVAQUIN) IV  750 mg Intravenous Q24H  . metoprolol tartrate  50 mg Oral BID  . pantoprazole  80 mg Oral Q1200  . sodium chloride  3 mL Intravenous Q12H   Continuous Infusions:  sodium chloride, albuterol, sodium chloride    PHYSICAL EXAM Filed Vitals:   12/21/14 2033 12/21/14 2049 12/22/14 0536 12/22/14 0933  BP: 125/66  119/55 130/68  Pulse: 63 65 53 52  Temp: 97.9 F (36.6 C)  97.4 F (36.3 C) 98.4 F (36.9 C)  TempSrc: Oral  Oral Oral  Resp: 22 18 16 18   Height:      Weight: 128.187 kg (282 lb 9.6 oz)     SpO2: 97% 97% 99% 97%    .pes Well developed and Morbidly obese male  in mild distress HENT normal Neck supple with JVP 10 Clear Irregularly irregular rate and rhythm, no murmurs or gallops Abd-soft with active BS No Clubbing cyanosis tr edema Skin-warm and dry A & Oriented  Grossly normal sensory and motor function   TELEMETRY: Reviewed telemetry pt in  afib RVR converted from sinus this  am   Intake/Output Summary (Last 24 hours) at 12/22/14 1226 Last data filed at 12/22/14 0600  Gross per 24 hour  Intake    483 ml  Output      0 ml  Net    483 ml    LABS: Basic Metabolic Panel:  Recent Labs Lab 12/21/14 1035 12/21/14 2005 12/22/14 0412  NA 134*  --  134*  K 4.1  --  3.6  CL 98*  --  95*  CO2 24  --  28  GLUCOSE 120*  --  138*  BUN 9  --  8  CREATININE 0.85  --  0.84  CALCIUM 8.8*  --  8.8*  MG  --  1.8  --    Cardiac Enzymes:  Recent Labs  12/21/14 2005 12/21/14 2151 12/22/14 0412  TROPONINI 0.03 0.03 <0.03   CBC:  Recent Labs Lab 12/21/14 1035 12/22/14 0412  WBC 10.3 9.0  HGB 12.9* 13.1  HCT 38.7* 39.6  MCV 90.0 91.0  PLT 204 190   PROTIME: No results for input(s): LABPROT, INR in the last 72 hours. Liver Function Tests:  Recent Labs  12/21/14 1205  AST 57*  ALT 69*  ALKPHOS 93  BILITOT  1.0  PROT 6.9  ALBUMIN 3.8    Recent Labs  12/21/14 1205  LIPASE 28   BNP: BNP (last 3 results)  Recent Labs  12/21/14 1035  BNP 318.2*    ProBNP (last 3 results)  Recent Labs  07/18/14 2156  PROBNP 1325.0*      ASSESSMENT AND PLAN:  Principal Problem:   Acute respiratory failure Active Problems:   PAF (paroxysmal atrial fibrillation)   Essential hypertension   Obstructive sleep apnea   Hypoxia   Acute on chronic diastolic CHF (congestive heart failure)   SOB (shortness of breath)   Pulmonary infiltrates   PNA (pneumonia)   Morbid obesity  Agree with pulm that likely HFpEF  Will continue aggressive diuresis overnight Increase BB to home dose F/u shceduld withJA on Monday  Signed, Virl Axe MD  12/22/2014

## 2014-12-22 NOTE — Consult Note (Signed)
Name: Harold Scott MRN: [id number] DOB: [birth year]    ADMISSION DATE:  12/21/2014 CONSULTATION DATE:  5/4  REFERRING MD :  [place] Pink ([facility])   CHIEF COMPLAINT:  Respiratory failure   BRIEF PATIENT DESCRIPTION:  54yo male former smoker with hx PAF on eliquis, OSA, GERD, ?asthma presented 5/3 with 2 day hx persistent SOB. Admitted by [facility], seen in consultation by cards and felt to be volume overloaded and was diuresed.  CT chest was done to r/o PE (neg) but showed reticulonodular and ground-glass opacities and PCCM consulted for further eval.   SIGNIFICANT EVENTS    STUDIES:  CTA chest 5/3>>> NEG PE, Reticulonodular and ground-glass opacities in the mid and lower lung zones ESR >>25 ANCA >>   HISTORY OF PRESENT ILLNESS:  54yo male former smoker with hx PAF on eliquis, OSA, GERD, ?asthma presented 5/3 with 2 day hx persistent SOB.  Pt is overall in "good health", minimal restrictions at baseline, able to perform all ADLs without assistance of supplemental O2.  SOB began acutely 5/2 when he was sitting down at lunch, then subsided some but persisted through 5/3 when he came to ER.  He denies cough, fever, chest pain, diaphoresis, hemoptysis, leg/calf pain.  States SOB is much improved this am.  Had "lots" of urine from diuresis 5/3. Did have some mild BLE edema and abd fullness which is much improved.    Has traveled all over the world in the past, including [place], [place], [place] but not in years.  No known TB exposure.  Now travels frequently to [place].  Pt was adopted, no known family hx autoimmune d/o.   PAST MEDICAL HISTORY :   has a past medical history of Anxiety; Depression; GERD (gastroesophageal reflux disease); Gallstones; Asthma; PONV (postoperative nausea and vomiting); Paroxysmal atrial fibrillation; and Obstructive sleep apnea.  has past surgical history that includes Hand surgery; Tonsillectomy; Wisdom tooth extraction; Cholecystectomy (08/05/2012); laparoscopic  appendectomy (N/A, 02/13/2014); TEE without cardioversion (N/A, 09/20/2014); Ablation (09/21/14); and atrial fibrillation ablation (N/A, 09/21/2014). Prior to Admission medications   Medication Sig Start Date End Date Taking? Authorizing Provider  albuterol (PROVENTIL HFA;VENTOLIN HFA) 108 (90 BASE) MCG/ACT inhaler Inhale 1-2 puffs into the lungs every 6 (six) hours as needed for wheezing or shortness of breath.   Yes Historical Provider, MD  cholestyramine Lucrezia Starch) 4 G packet TAKE 2 PACKETS DAILY Patient taking differently: TAKE 2 PACKETS BY MOUTH DAILY 06/03/14  Yes Dorena Cookey, MD  citalopram (CELEXA) 10 MG tablet TAKE 1 TABLET BY MOUTH ONCE DAILY 12/15/14  Yes Dorena Cookey, MD  clorazepate (TRANXENE) 7.5 MG tablet Take 1 tablet (7.5 mg total) by mouth at bedtime. 07/09/14  Yes Dorena Cookey, MD  dofetilide (TIKOSYN) 500 MCG capsule Take 1 capsule (500 mcg total) by mouth 2 (two) times daily. 07/23/14  Yes Rhonda G Barrett, PA-C  ELIQUIS 5 MG TABS tablet TAKE 1 TABLET TWICE A DAY 11/17/14  Yes [name], MD  esomeprazole (NEXIUM) 40 MG capsule Take 40 mg by mouth daily at 12 noon.   Yes Historical Provider, MD  guaiFENesin 200 MG tablet Take 200 mg by mouth 2 (two) times daily as needed (for cold).   Yes Historical Provider, MD  ibuprofen (ADVIL,MOTRIN) 200 MG tablet Take 400 mg by mouth every 6 (six) hours as needed. For pain   Yes Historical Provider, MD  losartan (COZAAR) 50 MG tablet TAKE 1 TABLET (50 MG TOTAL) BY MOUTH DAILY. 10/28/14  Yes [name],  MD  metoprolol (LOPRESSOR) 50 MG tablet Take 1 tablet (50 mg total) by mouth 2 (two) times daily. 12/16/14  Yes [name], MD  simvastatin (ZOCOR) 20 MG tablet Take 1 tablet (20 mg total) by mouth daily at 6 PM. 07/23/14  Yes Rhonda G Barrett, PA-C   Allergies  Allergen Reactions  . Flecainide Other (See Comments)    EXTREME SOB  . Multaq [Dronedarone] Shortness Of Breath  . Penicillins Hives    FAMILY HISTORY:  family history is  not on file. He was adopted. SOCIAL HISTORY:  reports that he quit smoking about 8 years ago. His smoking use included Cigarettes. He has a 15 pack-year smoking history. He has never used smokeless tobacco. He reports that he drinks about 4.2 - 8.4 oz of alcohol per week. He reports that he does not use illicit drugs.  REVIEW OF SYSTEMS:   As per HPI - All other systems reviewed and were neg.     SUBJECTIVE:   VITAL SIGNS: Temp:  [97.4 F (36.3 C)-98.6 F (37 C)] 97.4 F (36.3 C) (05/04 0536) Pulse Rate:  [53-75] 53 (05/04 0536) Resp:  [16-22] 16 (05/04 0536) BP: (119-155)/(55-83) 119/55 mmHg (05/04 0536) SpO2:  [91 %-99 %] 99 % (05/04 0536) Weight:  [281 lb 12 oz (127.8 kg)-282 lb 9.6 oz (128.187 kg)] 282 lb 9.6 oz (128.187 kg) (05/03 2033)  PHYSICAL EXAMINATION: General:  Very pleasant obese male, NAD Neuro:  Awake, alert, appropriate, MAE  HEENT:  Mm moist, no JVD  Cardiovascular:  s1s2 rrr, NSR 50's Lungs:  resps even, non labored on [place], clear posteriorly  Abdomen:  Round, soft, +bs, non tender Musculoskeletal:  Warm and dry, scant BLE edema    Recent Labs Lab 12/21/14 1035 12/22/14 0412  NA 134* 134*  K 4.1 3.6  CL 98* 95*  CO2 24 28  BUN 9 8  CREATININE 0.85 0.84  GLUCOSE 120* 138*    Recent Labs Lab 12/21/14 1035 12/22/14 0412  HGB 12.9* 13.1  HCT 38.7* 39.6  WBC 10.3 9.0  PLT 204 190   Dg Chest 2 View  12/21/2014   CLINICAL DATA:  Shortness of breath, asthma, history of atrial fibrillation 07/18/2014  EXAM: CHEST  2 VIEW  COMPARISON:  07/18/2014  FINDINGS: Cardiomediastinal silhouette is stable. No acute infiltrate or pulmonary edema. There is reticular mild interstitial prominence bilateral lower lobes probable chronic in nature. Mild degenerative changes thoracic spine.  IMPRESSION: No segmental infiltrate or pulmonary edema. Reticular mild interstitial prominent bilateral lower lobes probable chronic in nature. Mild degenerative changes thoracic spine.    Electronically Signed   By: Lahoma Crocker M.D.   On: 12/21/2014 11:49   Ct Angio Chest Pe W/cm &/or Wo Cm  12/21/2014   CLINICAL DATA:  Shortness of breath for 24 hours.  EXAM: CT ANGIOGRAPHY CHEST WITH CONTRAST  TECHNIQUE: Multidetector CT imaging of the chest was performed using the standard protocol during bolus administration of intravenous contrast. Multiplanar CT image reconstructions and MIPs were obtained to evaluate the vascular anatomy.  CONTRAST:  120m OMNIPAQUE IOHEXOL 350 MG/ML SOLN  COMPARISON:  None.  FINDINGS: CT CHEST FINDINGS  Mediastinum/Nodes: Heart is borderline in size. Scattered coronary artery calcifications within the left anterior descending coronary artery, left circumflex and right coronary artery. No mediastinal, hilar, or axillary adenopathy.  Lungs/Pleura: Scattered ground-glass opacities with reticulonodular densities in the mid and lower lung zones. No confluent airspace opacities. No effusions.  Musculoskeletal: No acute bony abnormality or focal  bone lesion. Chest wall soft tissues are unremarkable.  Upper abdomen: Diffuse fatty infiltration of the liver. Prior cholecystectomy. No acute findings in the upper abdomen.  Review of the MIP images confirms the above findings.  IMPRESSION: Reticulonodular and ground-glass opacities in the mid and lower lung zones, most likely inflammatory.  No evidence of pulmonary embolus.  Coronary artery disease.  Fatty liver.   Electronically Signed   By: Rolm Baptise M.D.   On: 12/21/2014 13:23    ASSESSMENT / PLAN:  Dyspnea  Hypoxia  Abnormal CT chest  PAF  OSA - very compliant with CPAP   Suspect acute dyspnea most likely r/t volume overload in setting PAF.  Per pt he has been in AFib much more than usual over the last 7-10 days.  Had progressive abd fullness and SOB which are both much improved with diuresis.  He is religiously compliant with CPAP at home.  Unclear etiology of CT abnormalities.  Does have mildly elevated sed rate.   HIV, ANCA, ANA pending.   PLAN -  Cont gentle diuresis per cards  HR control  Check pct  Consider PRN lasix for home  Wean supplemental O2 as able  F/u autoimmune w/u  Cont empiric abx for ?atypical PNA  Cont CPAP qhs  Jo-1, SSA, anti-scl70 pending  outpt pulm f/u     Harold Madrid, NP 12/22/2014  9:31 AM Pager: [phone] or [phone]

## 2014-12-22 NOTE — Progress Notes (Signed)
[facility] PROGRESS NOTE  Harold Scott TKP:[id number] DOB: 1960-11-07 DOA: 12/21/2014 PCP: [name], MD  Assessment/Plan: 1-acute resp failure with hypoxia: appears to be secondary to acute on chronic diastolic heart failure with pulmonary vascular congestion. -patient experiencing SOB with exertion especially and has been in and out of atrial fibrillation (most likely leading to diastolic heart dysfunction) -cardiology on board -will continue IV diuresis and per rec's from EP will resume B-blocker at 50mg  BID -continue O2 supplementation as needed -patient might need ablation if rhythm otherwise remained difficult to controlled -continue tykosin  -pro-calcitonin suggesting no bacterial infection; will d/c antibiotics  2-presumed PNA: given ground glass opacities on CT. -patient w/o fever, cough or elevated WBC's -pro-calcitonin level is suggesting no bact infection, will discontinue antibiotics -follow autoimmune profile -most likely viral pneumonitis  3-atrial fibrillation: patient converted back to A. Fib this morning -will continue tykosin and eliquis -will check Mg level and will try to maintain potassium above 4 -per cardiology will resume B-blocker at 50mg  BID -follow 2-D echo  4-acute on chronic diastolic heart failure: -continue daily weights, low sodium diet -follow repeated 2-D echo -continue b-blocker -cardiology on board -will continue IV lasix and will follow strict I's and O's  5-GERD: will continue PPI  6-obesity: with Body mass index of 41.71 kg/(m^2).   -weight loss and low calorie det discussed with patient  7-OSA: will continue CPAP QHS  8-depression: will continue celexa   Code Status: Full Family Communication: no family at bedside Disposition Plan: to determine    Consultants:  Cardiology  PCCM   Procedures:  2-D echo: pending   Antibiotics:  levaquin 5/3 >> 5/4  HPI/Subjective: Afebrile, no CP and just  complaining of SOB.  Objective: Filed Vitals:   12/22/14 0933  BP: 130/68  Pulse: 52  Temp: 98.4 F (36.9 C)  Resp: 18    Intake/Output Summary (Last 24 hours) at 12/22/14 1241 Last data filed at 12/22/14 0600  Gross per 24 hour  Intake    483 ml  Output      0 ml  Net    483 ml   Filed Weights   12/21/14 1809 12/21/14 2033  Weight: 127.8 kg (281 lb 12 oz) 128.187 kg (282 lb 9.6 oz)    Exam:   General:  Denies CP; still feeling SOB and in/out atrial fibrillation. No fever, no cough and normal WBC's  Cardiovascular: irregular, no rubs or gallops; no murmurs; mild JVD appreciated   Respiratory: mild crackles and decrease BS at the bases, no wheezing  Abdomen: obese, soft, NT, ND, positive BS  Musculoskeletal: trace LE edema bilaterally  Data Reviewed: Basic Metabolic Panel:  Recent Labs Lab 12/21/14 1035 12/21/14 2005 12/22/14 0412  NA 134*  --  134*  K 4.1  --  3.6  CL 98*  --  95*  CO2 24  --  28  GLUCOSE 120*  --  138*  BUN 9  --  8  CREATININE 0.85  --  0.84  CALCIUM 8.8*  --  8.8*  MG  --  1.8  --    Liver Function Tests:  Recent Labs Lab 12/21/14 1205  AST 57*  ALT 69*  ALKPHOS 93  BILITOT 1.0  PROT 6.9  ALBUMIN 3.8    Recent Labs Lab 12/21/14 1205  LIPASE 28   CBC:  Recent Labs Lab 12/21/14 1035 12/22/14 0412  WBC 10.3 9.0  HGB 12.9* 13.1  HCT 38.7* 39.6  MCV 90.0 91.0  PLT 204 190   Cardiac Enzymes:  Recent Labs Lab 12/21/14 2005 12/21/14 2151 12/22/14 0412  TROPONINI 0.03 0.03 <0.03   BNP (last 3 results)  Recent Labs  12/21/14 1035  BNP 318.2*    ProBNP (last 3 results)  Recent Labs  07/18/14 2156  PROBNP 1325.0*    Studies: Dg Chest 2 View  12/21/2014   CLINICAL DATA:  Shortness of breath, asthma, history of atrial fibrillation 07/18/2014  EXAM: CHEST  2 VIEW  COMPARISON:  07/18/2014  FINDINGS: Cardiomediastinal silhouette is stable. No acute infiltrate or pulmonary edema. There is reticular mild  interstitial prominence bilateral lower lobes probable chronic in nature. Mild degenerative changes thoracic spine.  IMPRESSION: No segmental infiltrate or pulmonary edema. Reticular mild interstitial prominent bilateral lower lobes probable chronic in nature. Mild degenerative changes thoracic spine.   Electronically Signed   By: Lahoma Crocker M.D.   On: 12/21/2014 11:49   Ct Angio Chest Pe W/cm &/or Wo Cm  12/21/2014   CLINICAL DATA:  Shortness of breath for 24 hours.  EXAM: CT ANGIOGRAPHY CHEST WITH CONTRAST  TECHNIQUE: Multidetector CT imaging of the chest was performed using the standard protocol during bolus administration of intravenous contrast. Multiplanar CT image reconstructions and MIPs were obtained to evaluate the vascular anatomy.  CONTRAST:  116mL OMNIPAQUE IOHEXOL 350 MG/ML SOLN  COMPARISON:  None.  FINDINGS: CT CHEST FINDINGS  Mediastinum/Nodes: Heart is borderline in size. Scattered coronary artery calcifications within the left anterior descending coronary artery, left circumflex and right coronary artery. No mediastinal, hilar, or axillary adenopathy.  Lungs/Pleura: Scattered ground-glass opacities with reticulonodular densities in the mid and lower lung zones. No confluent airspace opacities. No effusions.  Musculoskeletal: No acute bony abnormality or focal bone lesion. Chest wall soft tissues are unremarkable.  Upper abdomen: Diffuse fatty infiltration of the liver. Prior cholecystectomy. No acute findings in the upper abdomen.  Review of the MIP images confirms the above findings.  IMPRESSION: Reticulonodular and ground-glass opacities in the mid and lower lung zones, most likely inflammatory.  No evidence of pulmonary embolus.  Coronary artery disease.  Fatty liver.   Electronically Signed   By: Rolm Baptise M.D.   On: 12/21/2014 13:23    Scheduled Meds: . apixaban  5 mg Oral BID  . cholestyramine  8 g Oral QAC breakfast  . citalopram  10 mg Oral Daily  . clorazepate  7.5 mg Oral QHS   . dofetilide  500 mcg Oral BID  . furosemide  40 mg Intravenous BID  . metoprolol succinate  25 mg Oral BID  . pantoprazole  80 mg Oral Q1200  . potassium chloride  40 mEq Oral Once  . sodium chloride  3 mL Intravenous Q12H   Continuous Infusions:   Principal Problem:   Acute respiratory failure Active Problems:   PAF (paroxysmal atrial fibrillation)   Essential hypertension   Obstructive sleep apnea   Hypoxia   Acute on chronic diastolic CHF (congestive heart failure)   SOB (shortness of breath)   Pulmonary infiltrates   PNA (pneumonia)   Morbid obesity   Acute respiratory failure with hypoxia    Time spent: 30 minutes    [name]  [facility] Pager [phone]. If 7PM-7AM, please contact night-coverage at [email or web address], password [facility] 12/22/2014, 12:41 PM  LOS: 1 day

## 2014-12-22 NOTE — Progress Notes (Signed)
Echocardiogram with Definity has been performed.

## 2014-12-23 DIAGNOSIS — I5033 Acute on chronic diastolic (congestive) heart failure: Principal | ICD-10-CM

## 2014-12-23 LAB — BASIC METABOLIC PANEL
ANION GAP: 10 (ref 5–15)
BUN: 12 mg/dL (ref 6–20)
CALCIUM: 9.1 mg/dL (ref 8.9–10.3)
CO2: 28 mmol/L (ref 22–32)
Chloride: 95 mmol/L — ABNORMAL LOW (ref 101–111)
Creatinine, Ser: 1.08 mg/dL (ref 0.61–1.24)
GFR calc Af Amer: >60 mL/min (ref >60)
GFR calc non Af Amer: >60 mL/min (ref >60)
GLUCOSE: 106 mg/dL — AB (ref 70–99)
Potassium: 4.2 mmol/L (ref 3.5–5.1)
Sodium: 133 mmol/L — ABNORMAL LOW (ref 135–145)

## 2014-12-23 LAB — LEGIONELLA ANTIGEN, URINE

## 2014-12-23 LAB — SJOGRENS SYNDROME-A EXTRACTABLE NUCLEAR ANTIBODY: SSA (Ro) (ENA) Antibody, IgG: <1

## 2014-12-23 LAB — ANTI-SCLERODERMA ANTIBODY: Scleroderma (Scl-70) (ENA) Antibody, IgG: <1

## 2014-12-23 LAB — JO-1 ANTIBODY-IGG: Jo-1 Antibody, IgG: <1

## 2014-12-23 LAB — RHEUMATOID FACTOR: Rhuematoid fact SerPl-aCnc: 7.1 IU/mL (ref 0.0–13.9)

## 2014-12-23 LAB — PROCALCITONIN: Procalcitonin: 0.15 ng/mL

## 2014-12-23 LAB — SJOGRENS SYNDROME-B EXTRACTABLE NUCLEAR ANTIBODY: SSB (La) (ENA) Antibody, IgG: <1

## 2014-12-23 LAB — HIV ANTIBODY (ROUTINE TESTING W REFLEX): HIV SCREEN 4TH GENERATION: NONREACTIVE

## 2014-12-23 MED ORDER — FUROSEMIDE 10 MG/ML IJ SOLN
40.0000 mg | Freq: Two times a day (BID) | INTRAMUSCULAR | Status: DC
Start: 2014-12-23 — End: 2014-12-24
  Administered 2014-12-23: 40 mg via INTRAVENOUS

## 2014-12-23 MED ORDER — TRAMADOL HCL 50 MG PO TABS
50.0000 mg | ORAL_TABLET | Freq: Three times a day (TID) | ORAL | Status: DC | PRN
  Administered 2014-12-23: 50 mg via ORAL
  Filled 2014-12-23 (×2): qty 1

## 2014-12-23 MED ORDER — POTASSIUM CHLORIDE CRYS ER 20 MEQ PO TBCR
40.0000 meq | EXTENDED_RELEASE_TABLET | Freq: Once | ORAL | Status: AC
Start: 2014-12-23 — End: 2014-12-23
  Administered 2014-12-23: 40 meq via ORAL
  Filled 2014-12-23: qty 2

## 2014-12-23 MED ORDER — ACETAMINOPHEN 325 MG PO TABS: 650.0000 mg | ORAL_TABLET | Freq: Four times a day (QID) | ORAL | Status: DC | PRN

## 2014-12-23 NOTE — Progress Notes (Signed)
Name: Harold Scott MRN: [id number] DOB: Jan 18, 1961    ADMISSION DATE:  12/21/2014 CONSULTATION DATE:  5/4  REFERRING MD :  [place] Pink ([facility])   CHIEF COMPLAINT:  Respiratory failure   BRIEF PATIENT DESCRIPTION:  54yo male former smoker with hx PAF on eliquis, OSA, GERD, ?asthma presented 5/3 with 2 day hx persistent SOB. Admitted by [facility], seen in consultation by cards and felt to be volume overloaded and was diuresed.  CT chest was done to r/o PE (neg) but showed reticulonodular and ground-glass opacities and PCCM consulted for further eval.   SIGNIFICANT EVENTS    STUDIES:  CTA chest 5/3>>> NEG PE, Reticulonodular and ground-glass opacities in the mid and lower lung zones ESR >>25 ANCA >> negative     SUBJECTIVE: feeling better  VITAL SIGNS: Temp:  [97.8 F (36.6 C)-98.3 F (36.8 C)] 97.8 F (36.6 C) (05/05 0900) Pulse Rate:  [62-102] 62 (05/05 0900) Resp:  [16-20] 20 (05/05 0900) BP: (101-116)/(60-76) 108/60 mmHg (05/05 0900) SpO2:  [92 %-98 %] 96 % (05/05 0900) Weight:  [272 lb 4.8 oz (123.514 kg)] 272 lb 4.8 oz (123.514 kg) (05/04 2145)  PHYSICAL EXAMINATION: General:  No distress HENT: NCAT, OP clear PULM : CTA B my exam, normal effort CV: RRR, no mgr GI: BS+, soft MSK: normal bulk and tone Neuro: A&Ox4, maew   Recent Labs Lab 12/21/14 1035 12/22/14 0412  NA 134* 134*  K 4.1 3.6  CL 98* 95*  CO2 24 28  BUN 9 8  CREATININE 0.85 0.84  GLUCOSE 120* 138*    Recent Labs Lab 12/21/14 1035 12/22/14 0412  HGB 12.9* 13.1  HCT 38.7* 39.6  WBC 10.3 9.0  PLT 204 190   CT chest images personally reviewed> intralobular nodules, inter-lobular septal thickening worse in bases , mild ground glass  ASSESSMENT / PLAN:  Dyspnea > improved Hypoxia > slowly improving Abnormal CT chest  Consistent with pulmonary venous hypertension from diastolic dysfunction PAF  OSA - very compliant with CPAP   CT is consistent with volume overload, he feels better with  diuresis Autoimmune labs negative  PLAN -  Continue diuresis Wean off O2 CPAP qHS Afib care per cardiology  PCCM to sign off   Roselie Awkward, MD [place] [facility] Pager: [phone] Cell: [phone] If no response, call [phone]

## 2014-12-23 NOTE — Progress Notes (Signed)
[facility] PROGRESS NOTE  Harold Scott EQA:[id number] DOB: 07-Feb-1961 DOA: 12/21/2014 PCP: [name], MD  Assessment/Plan: 1-acute resp failure with hypoxia: appears to be secondary to acute on chronic diastolic heart failure with pulmonary vascular congestion. -patient experiencing SOB with exertion especially and has been in and out of atrial fibrillation (most likely leading to diastolic heart dysfunction) -cardiology on board, will follow rec's -will continue IV diuresis and per rec's from EP will resume B-blocker at 50mg  BID -continue O2 supplementation as needed; start titration down to RA as tolerated -patient might need ablation if rhythm otherwise remained difficult to controlled. Has follow up with [name] on 5/9. -continue tykosin  -pro-calcitonin suggesting no bacterial infection and neg autoimmune panel. No antibiotics inidicated  2-presumed PNA: given ground glass opacities on CT. -patient w/o fever, cough or elevated WBC's -pro-calcitonin level is suggesting no bacterial infection, antibiotics discontinued on 5/4 -negative autoimmune profile -most likely secondary to vascular congestion and CHF exacerbation  3-atrial fibrillation: patient converted back to A. Fib this morning -will continue tykosin and eliquis -continue potassium supplementation to maintained level above 4 -Mg WNL -per cardiology will continue B-blocker at 50mg  BID (toprol) -2-D echo with preserved EF  4-acute on chronic diastolic heart failure: -continue daily weights, low sodium diet -repeated 2-D echo: demonstrating preserved EF, no wall motion abnormalities; moderate LA enlargement -continue b-blocker -cardiology on board; recommending to discharge home in am with increase dose of lasix and follow up with [name] on 5/9 -will continue IV lasix X 2 more doses and will follow strict I's and O's  5-GERD: will continue PPI  6-obesity: with Body mass index of 41.71 kg/(m^2).    -weight loss and low calorie det discussed with patient  7-OSA: will continue CPAP QHS  8-depression: will continue celexa   Code Status: Full Family Communication: no family at bedside Disposition Plan: to determine    Consultants:  Cardiology  PCCM   Procedures:  2-D echo: pending   Antibiotics:  levaquin 5/3 >> 5/4  HPI/Subjective: Afebrile, reports breathing is better. No CP and denies significant orthopnea  Objective: Filed Vitals:   12/23/14 0900  BP: 108/60  Pulse: 62  Temp: 97.8 F (36.6 C)  Resp: 20    Intake/Output Summary (Last 24 hours) at 12/23/14 1347 Last data filed at 12/23/14 0945  Gross per 24 hour  Intake    231 ml  Output      0 ml  Net    231 ml   Filed Weights   12/21/14 1809 12/21/14 2033 12/22/14 2145  Weight: 127.8 kg (281 lb 12 oz) 128.187 kg (282 lb 9.6 oz) 123.514 kg (272 lb 4.8 oz)    Exam:   General:  Denies CP; still with some SOB, but much improved. Continue to be in/out atrial fibrillation; but rate is now controlled. No fever, no cough and normal WBC's. Patient remains afebrile.  Cardiovascular: regular rate, no rubs or gallops; no murmurs; trace edema appreciated  Respiratory: no frank crackles appreciated; but continue to have decrease BS at the bases, no wheezing and with improve air movement   Abdomen: obese, soft, NT, ND, positive BS  Musculoskeletal: trace LE edema bilaterally  Data Reviewed: Basic Metabolic Panel:  Recent Labs Lab 12/21/14 1035 12/21/14 2005 12/22/14 0412 12/22/14 1300 12/23/14 1110  NA 134*  --  134*  --  133*  K 4.1  --  3.6  --  4.2  CL 98*  --  95*  --  95*  CO2 24  --  28  --  28  GLUCOSE 120*  --  138*  --  106*  BUN 9  --  8  --  12  CREATININE 0.85  --  0.84  --  1.08  CALCIUM 8.8*  --  8.8*  --  9.1  MG  --  1.8  --  1.8  --    Liver Function Tests:  Recent Labs Lab 12/21/14 1205  AST 57*  ALT 69*  ALKPHOS 93  BILITOT 1.0  PROT 6.9  ALBUMIN 3.8     Recent Labs Lab 12/21/14 1205  LIPASE 28   CBC:  Recent Labs Lab 12/21/14 1035 12/22/14 0412  WBC 10.3 9.0  HGB 12.9* 13.1  HCT 38.7* 39.6  MCV 90.0 91.0  PLT 204 190   Cardiac Enzymes:  Recent Labs Lab 12/21/14 2005 12/21/14 2151 12/22/14 0412  TROPONINI 0.03 0.03 <0.03   BNP (last 3 results)  Recent Labs  12/21/14 1035  BNP 318.2*    ProBNP (last 3 results)  Recent Labs  07/18/14 2156  PROBNP 1325.0*    Studies: No results found.  Scheduled Meds: . apixaban  5 mg Oral BID  . cholestyramine  8 g Oral QAC breakfast  . citalopram  10 mg Oral Daily  . clorazepate  7.5 mg Oral QHS  . dofetilide  500 mcg Oral BID  . furosemide  40 mg Intravenous BID  . metoprolol succinate  25 mg Oral BID  . pantoprazole  80 mg Oral Q1200  . sodium chloride  3 mL Intravenous Q12H   Continuous Infusions:   Principal Problem:   Acute respiratory failure Active Problems:   PAF (paroxysmal atrial fibrillation)   Essential hypertension   Obstructive sleep apnea   Hypoxia   Acute on chronic diastolic CHF (congestive heart failure)   SOB (shortness of breath)   Pulmonary infiltrates   PNA (pneumonia)   Morbid obesity   Acute respiratory failure with hypoxia    Time spent: 30 minutes    [name]  [facility] Pager [phone]. If 7PM-7AM, please contact night-coverage at [email or web address], password [facility] 12/23/2014, 1:47 PM  LOS: 2 days

## 2014-12-23 NOTE — Progress Notes (Signed)
SATURATION QUALIFICATIONS: (This note is used to comply with regulatory documentation for home oxygen)  Patient Saturations on Room Air at Rest = 93%  Patient Saturations on Room Air while Ambulating = 89%  Patient Saturations on 2 Liters of oxygen while Ambulating = 93%  Please briefly explain why patient needs home oxygen:  Jillyn Ledger, MBA, BS, RN

## 2014-12-23 NOTE — Progress Notes (Addendum)
Patient Name: Harold Scott      SUBJECTIVE:hx of PAF with RFCA 2/16 and post operative frequent recurrences despite dofetilitde assoc with RVR and now presenting wht SOB and DOE with edema abd distension and orthopnea  Tel recurent AFIb with RVR although not notably in AFib at the time of decompensation  Much better  following diuresis  Home med is metoprolol succ  Echoj normal LV function and modest LA enlargement  Past Medical History  Diagnosis Date  . Anxiety   . Depression   . GERD (gastroesophageal reflux disease)   . Gallstones   . Asthma   . PONV (postoperative nausea and vomiting)   . Paroxysmal atrial fibrillation     a.  previously intolerant of Flecainide and Multaq b. PVI by Dr [name] 09/21/14  . Obstructive sleep apnea     a. on CPAP, followed by [facility]    Scheduled Meds:  Scheduled Meds: . apixaban  5 mg Oral BID  . cholestyramine  8 g Oral QAC breakfast  . citalopram  10 mg Oral Daily  . clorazepate  7.5 mg Oral QHS  . dofetilide  500 mcg Oral BID  . furosemide  40 mg Intravenous BID  . metoprolol succinate  25 mg Oral BID  . pantoprazole  80 mg Oral Q1200  . sodium chloride  3 mL Intravenous Q12H   Continuous Infusions:  sodium chloride, albuterol, sodium chloride    PHYSICAL EXAM Filed Vitals:   12/22/14 0933 12/22/14 2145 12/22/14 2255 12/23/14 0533  BP: 130/68 101/67  116/76  Pulse: 52 102  76  Temp: 98.4 F (36.9 C) 97.9 F (36.6 C)  98.3 F (36.8 C)  TempSrc: Oral Oral  Oral  Resp: 18 20 18 16   Height:      Weight:  272 lb 4.8 oz (123.514 kg)    SpO2: 97% 92%  98%    .pes Well developed and Morbidly obese male  in mild distress HENT normal Neck supple with JVP 8 Clear Irregularly irregular rate and rhythm, no murmurs or gallops Abd-soft with active BS No Clubbing cyanosi  No  edema Skin-warm and dry A & Oriented  Grossly normal sensory and motor function   TELEMETRY: Reviewed telemetry pt in  afib RVR converted  from sinus this am   Intake/Output Summary (Last 24 hours) at 12/23/14 0801 Last data filed at 12/22/14 2000  Gross per 24 hour  Intake    111 ml  Output      0 ml  Net    111 ml    LABS: Basic Metabolic Panel:  Recent Labs Lab 12/21/14 1035 12/21/14 2005 12/22/14 0412 12/22/14 1300  NA 134*  --  134*  --   K 4.1  --  3.6  --   CL 98*  --  95*  --   CO2 24  --  28  --   GLUCOSE 120*  --  138*  --   BUN 9  --  8  --   CREATININE 0.85  --  0.84  --   CALCIUM 8.8*  --  8.8*  --   MG  --  1.8  --  1.8   Cardiac Enzymes:  Recent Labs  12/21/14 2005 12/21/14 2151 12/22/14 0412  TROPONINI 0.03 0.03 <0.03   CBC:  Recent Labs Lab 12/21/14 1035 12/22/14 0412  WBC 10.3 9.0  HGB 12.9* 13.1  HCT 38.7* 39.6  MCV 90.0 91.0  PLT  204 190   PROTIME: No results for input(s): LABPROT, INR in the last 72 hours. Liver Function Tests:  Recent Labs  12/21/14 1205  AST 57*  ALT 69*  ALKPHOS 93  BILITOT 1.0  PROT 6.9  ALBUMIN 3.8    Recent Labs  12/21/14 1205  LIPASE 28   BNP: BNP (last 3 results)  Recent Labs  12/21/14 1035  BNP 318.2*    ProBNP (last 3 results)  Recent Labs  07/18/14 2156  PROBNP 1325.0*      ASSESSMENT AND PLAN:  Principal Problem:   Acute respiratory failure Active Problems:   PAF (paroxysmal atrial fibrillation)   Essential hypertension   Obstructive sleep apnea   Hypoxia   Acute on chronic diastolic CHF (congestive heart failure)   SOB (shortness of breath)   Pulmonary infiltrates   PNA (pneumonia)   Morbid obesity   Acute respiratory failure with hypoxia  Patient is feeling considerably better. We'll discharge him to home. We need to check his metabolic profile which I failed to order for this morning. He will follow-up with Dr. Greggory Brandy week.  Would discharge on Lasix 40 daily and KCL 20 daily  Signed, Virl Axe MD  12/23/2014

## 2014-12-23 NOTE — Progress Notes (Signed)
Utilization review complete. Alisson Rozell RN CCM Case Mgmt phone [phone] 

## 2014-12-23 NOTE — Progress Notes (Signed)
Patient refused CPAP at this time. Patient states equipment is too noisy. He has tried several times to wear.  CPAP removed from patients room.  Encouraged patient to call for Respiratory if he changed his mind.

## 2014-12-24 DIAGNOSIS — G4733 Obstructive sleep apnea (adult) (pediatric): Secondary | ICD-10-CM

## 2014-12-24 LAB — BASIC METABOLIC PANEL
Anion gap: 11 (ref 5–15)
BUN: 14 mg/dL (ref 6–20)
CO2: 27 mmol/L (ref 22–32)
Calcium: 9 mg/dL (ref 8.9–10.3)
Chloride: 96 mmol/L — ABNORMAL LOW (ref 101–111)
Creatinine, Ser: 0.96 mg/dL (ref 0.61–1.24)
Glucose, Bld: 127 mg/dL — ABNORMAL HIGH (ref 70–99)
Potassium: 4 mmol/L (ref 3.5–5.1)
Sodium: 134 mmol/L — ABNORMAL LOW (ref 135–145)

## 2014-12-24 MED ORDER — METOPROLOL SUCCINATE ER 25 MG PO TB24: 25.0000 mg | ORAL_TABLET | Freq: Two times a day (BID) | ORAL | Status: DC

## 2014-12-24 MED ORDER — FUROSEMIDE 40 MG PO TABS
40.0000 mg | ORAL_TABLET | Freq: Every day | ORAL | Status: DC
  Administered 2014-12-24: 40 mg via ORAL
  Filled 2014-12-24: qty 1

## 2014-12-24 MED ORDER — POTASSIUM CHLORIDE CRYS ER 20 MEQ PO TBCR
20.0000 meq | EXTENDED_RELEASE_TABLET | Freq: Every day | ORAL | Status: DC
  Administered 2014-12-24: 20 meq via ORAL
  Filled 2014-12-24: qty 1

## 2014-12-24 MED ORDER — FUROSEMIDE 40 MG PO TABS: 40.0000 mg | ORAL_TABLET | Freq: Every day | ORAL | Status: DC

## 2014-12-24 MED ORDER — TRAMADOL HCL 50 MG PO TABS: 50.0000 mg | ORAL_TABLET | Freq: Three times a day (TID) | ORAL | Status: DC | PRN

## 2014-12-24 MED ORDER — POTASSIUM CHLORIDE CRYS ER 20 MEQ PO TBCR: 20.0000 meq | EXTENDED_RELEASE_TABLET | Freq: Every day | ORAL | Status: DC

## 2014-12-24 MED ORDER — MAGNESIUM SULFATE 2 GM/50ML IV SOLN
2.0000 g | Freq: Once | INTRAVENOUS | Status: AC
  Administered 2014-12-24: 2 g via INTRAVENOUS
  Filled 2014-12-24: qty 50

## 2014-12-24 NOTE — Progress Notes (Signed)
  Patient Saturations on Room Air at Rest = 94-95%  Patient Saturations on [employer] while Ambulating = 94-97%    Patient ambulated hallway on RA without any noted distress. He denied pain and SOB. Will continue to monitor.   Ave Filter, RN

## 2014-12-24 NOTE — Discharge Summary (Signed)
Physician Discharge Summary  Harold Scott OEH:[id number] DOB: May 30, 1961 DOA: 12/21/2014  PCP: [name], MD  Admit date: 12/21/2014 Discharge date: 12/24/2014  Time spent: >30 minutes  Recommendations for Outpatient Follow-up:  1. Check BMET to follow on electrolytes (especially potassium, needs to be above 4) 2. Check Mg level (needs to be above 2)   Discharge Diagnoses:  Principal Problem:   Acute respiratory failure Active Problems:   PAF (paroxysmal atrial fibrillation)   Essential hypertension   Obstructive sleep apnea   Hypoxia   Acute on chronic diastolic CHF (congestive heart failure)   SOB (shortness of breath)   Pulmonary infiltrates   PNA (pneumonia)   Morbid obesity   Acute respiratory failure with hypoxia   Discharge Condition: stable and improved/ will discharge home. Will follow with [name] (cardiology/EP) on 12/27/14  Diet recommendation: low sodium diet and low calorie diet  Filed Weights   12/21/14 2033 12/22/14 2145 12/23/14 2052  Weight: 128.187 kg (282 lb 9.6 oz) 123.514 kg (272 lb 4.8 oz) 126.78 kg (279 lb 8 oz)    History of present illness:  54 y.o. Male Past mental history of obesity, sleep apnea and paroxysmal atrial fibrillation who overall is in good health and has had no medical issues and at his baseline, is able to participate in all activities of daily living without any assistance or supplement oxygen. Presented to ED secondary to worsening SOB and found to be hypoxic. BNP slightly elevated and CT scan of the chest suggesting either atypical PNA vs vascular congestion. Patient was also experiencing in/out episodes of A. Fib. Admitted for acute on chronic diastolic heart failure.  Hospital Course:  1-acute resp failure with hypoxia: appears to be secondary to acute on chronic diastolic heart failure with pulmonary vascular congestion. -patient experiencing SOB with exertion especially and has been in and out of atrial fibrillation  (most likely leading to diastolic heart exacerbation) -will discharge on low sodium diet, lasix 40mg  daily and follow up with EP ([name]) on 12/27/14 -no oxygen needed at discharge -patient might need ablation at some point, if rhythm otherwise remained difficult to controlled.  -continue tykosin  -pro-calcitonin suggesting no bacterial infection and neg autoimmune panel. No antibiotics inidicated  2-presumed PNA: given ground glass opacities on CT. -patient w/o fever, cough or elevated WBC's -pro-calcitonin level at 0.5 and lower on subsequent check; this is suggesting no bacterial infection, antibiotics discontinued on 5/4 -negative autoimmune profile -most likely secondary to vascular congestion and CHF exacerbation -appreciate pulmonary service assistance and guidance  3-atrial fibrillation: patient converted back to SR prior to discharge -will continue tykosin, toprol and eliquis -continue potassium supplementation to maintained level above 4 -Mg WNL at discharge -2-D echo with preserved EF -will follow with [name] on 5/9 for further decisions and treatment   4-acute on chronic diastolic heart failure: -advise to check daily weights, low sodium diet -repeated 2-D echo: demonstrating preserved EF, no wall motion abnormalities; moderate LA enlargement -continue b-blocker -cardiology on board; recommending to discharge home in am with increase dose of lasix and follow up with [name] on 5/9 -will discharge on lasix 40mg  daily  5-GERD: will continue PPI  6-obesity: with Body mass index of 41.71 kg/(m^2).  -weight loss and low calorie det discussed with patient  7-OSA: will continue CPAP QHS  8-depression: will continue celexa   Procedures:  See below for x-ray reports  2-D echo: 12/22/14 - Left ventricle: The cavity size was normal. There was severe  focal basal hypertrophy. Systolic function was vigorous. The estimated ejection fraction was in the range of  65% to 70%. Wall motion was normal; there were no regional wall motion abnormalities. - Aortic valve: Poorly visualized. - Mitral valve: Poorly visualized. There was trivial regurgitation. - Left atrium: The atrium was mildly dilated. - Pulmonic valve: Poorly visualized.  Consultations:  PCCM  Cardiology/EP   Discharge Exam: Filed Vitals:   12/24/14 0948  BP: 111/70  Pulse: 57  Temp: 98.4 F (36.9 C)  Resp: 18    General: Denies CP; no SOB today. Good O2 sat on RA. Patient is afebrile.  Cardiovascular: regular rate, no rubs or gallops; no murmurs; telemetry with SR  Respiratory: no frank crackles appreciated; improve air movement, no wheezing and with good O2 sat on RA   Abdomen: obese, soft, NT, ND, positive BS  Musculoskeletal: no LE edema, cyanosis or clubbing  Discharge Instructions   Discharge Instructions    Diet - low sodium heart healthy    Complete by:  As directed      Discharge instructions    Complete by:  As directed   Follow low sodium diet (less than 2 gram daily) Check weight on daily basis Take medications as prescribed Follow with [name] on 12/27/14          Current Discharge Medication List    START taking these medications   Details  furosemide (LASIX) 40 MG tablet Take 1 tablet (40 mg total) by mouth daily. Qty: 30 tablet, Refills: 1    metoprolol succinate (TOPROL-XL) 25 MG 24 hr tablet Take 1 tablet (25 mg total) by mouth 2 (two) times daily. Qty: 60 tablet, Refills: 1    potassium chloride SA (K-DUR,KLOR-CON) 20 MEQ tablet Take 1 tablet (20 mEq total) by mouth daily. Qty: 30 tablet, Refills: 1    traMADol (ULTRAM) 50 MG tablet Take 1 tablet (50 mg total) by mouth every 8 (eight) hours as needed for moderate pain. Qty: 40 tablet, Refills: 0      CONTINUE these medications which have NOT CHANGED   Details  albuterol (PROVENTIL HFA;VENTOLIN HFA) 108 (90 BASE) MCG/ACT inhaler Inhale 1-2 puffs into the lungs every 6 (six)  hours as needed for wheezing or shortness of breath.    cholestyramine (QUESTRAN) 4 G packet TAKE 2 PACKETS DAILY Qty: 60 packet, Refills: 10    citalopram (CELEXA) 10 MG tablet TAKE 1 TABLET BY MOUTH ONCE DAILY Qty: 90 tablet, Refills: 1    clorazepate (TRANXENE) 7.5 MG tablet Take 1 tablet (7.5 mg total) by mouth at bedtime. Qty: 30 tablet, Refills: 5    dofetilide (TIKOSYN) 500 MCG capsule Take 1 capsule (500 mcg total) by mouth 2 (two) times daily. Qty: 14 capsule, Refills: 0    ELIQUIS 5 MG TABS tablet TAKE 1 TABLET TWICE A DAY Qty: 60 tablet, Refills: 3    esomeprazole (NEXIUM) 40 MG capsule Take 40 mg by mouth daily at 12 noon.    guaiFENesin 200 MG tablet Take 200 mg by mouth 2 (two) times daily as needed (for cold).    losartan (COZAAR) 50 MG tablet TAKE 1 TABLET (50 MG TOTAL) BY MOUTH DAILY. Qty: 90 tablet, Refills: 0    simvastatin (ZOCOR) 20 MG tablet Take 1 tablet (20 mg total) by mouth daily at 6 PM. Qty: 30 tablet, Refills: 11      STOP taking these medications     ibuprofen (ADVIL,MOTRIN) 200 MG tablet      metoprolol (  LOPRESSOR) 50 MG tablet        Allergies  Allergen Reactions  . Flecainide Other (See Comments)    EXTREME SOB  . Multaq [Dronedarone] Shortness Of Breath  . Penicillins Hives   Follow-up Information    Follow up with [name], MD On 12/27/2014.   Specialty:  Cardiology   Contact information:   [place]  [id number] [phone]       The results of significant diagnostics from this hospitalization (including imaging, microbiology, ancillary and laboratory) are listed below for reference.    Significant Diagnostic Studies: Dg Chest 2 View  12/21/2014   CLINICAL DATA:  Shortness of breath, asthma, history of atrial fibrillation 07/18/2014  EXAM: CHEST  2 VIEW  COMPARISON:  07/18/2014  FINDINGS: Cardiomediastinal silhouette is stable. No acute infiltrate or pulmonary edema. There is reticular mild  interstitial prominence bilateral lower lobes probable chronic in nature. Mild degenerative changes thoracic spine.  IMPRESSION: No segmental infiltrate or pulmonary edema. Reticular mild interstitial prominent bilateral lower lobes probable chronic in nature. Mild degenerative changes thoracic spine.   Electronically Signed   By: Lahoma Crocker M.D.   On: 12/21/2014 11:49   Ct Angio Chest Pe W/cm &/or Wo Cm  12/21/2014   CLINICAL DATA:  Shortness of breath for 24 hours.  EXAM: CT ANGIOGRAPHY CHEST WITH CONTRAST  TECHNIQUE: Multidetector CT imaging of the chest was performed using the standard protocol during bolus administration of intravenous contrast. Multiplanar CT image reconstructions and MIPs were obtained to evaluate the vascular anatomy.  CONTRAST:  184mL OMNIPAQUE IOHEXOL 350 MG/ML SOLN  COMPARISON:  None.  FINDINGS: CT CHEST FINDINGS  Mediastinum/Nodes: Heart is borderline in size. Scattered coronary artery calcifications within the left anterior descending coronary artery, left circumflex and right coronary artery. No mediastinal, hilar, or axillary adenopathy.  Lungs/Pleura: Scattered ground-glass opacities with reticulonodular densities in the mid and lower lung zones. No confluent airspace opacities. No effusions.  Musculoskeletal: No acute bony abnormality or focal bone lesion. Chest wall soft tissues are unremarkable.  Upper abdomen: Diffuse fatty infiltration of the liver. Prior cholecystectomy. No acute findings in the upper abdomen.  Review of the MIP images confirms the above findings.  IMPRESSION: Reticulonodular and ground-glass opacities in the mid and lower lung zones, most likely inflammatory.  No evidence of pulmonary embolus.  Coronary artery disease.  Fatty liver.   Electronically Signed   By: Rolm Baptise M.D.   On: 12/21/2014 13:23    Microbiology: Recent Results (from the past 240 hour(s))  Culture, blood (routine x 2) Call MD if unable to obtain prior to antibiotics being given      Status: None (Preliminary result)   Collection Time: 12/21/14  7:57 PM  Result Value Ref Range Status   Specimen Description BLOOD LEFT ANTECUBITAL  Final   Special Requests BOTTLES DRAWN AEROBIC AND ANAEROBIC 5CC  Final   Culture   Final           BLOOD CULTURE RECEIVED NO GROWTH TO DATE CULTURE WILL BE HELD FOR 5 DAYS BEFORE ISSUING A FINAL NEGATIVE REPORT Performed at [facility]    Report Status PENDING  Incomplete  Culture, blood (routine x 2) Call MD if unable to obtain prior to antibiotics being given     Status: None (Preliminary result)   Collection Time: 12/21/14  8:05 PM  Result Value Ref Range Status   Specimen Description BLOOD RIGHT HAND  Final   Special Requests BOTTLES  DRAWN AEROBIC ONLY 8CC  Final   Culture   Final           BLOOD CULTURE RECEIVED NO GROWTH TO DATE CULTURE WILL BE HELD FOR 5 DAYS BEFORE ISSUING A FINAL NEGATIVE REPORT Performed at [facility]    Report Status PENDING  Incomplete     Labs: Basic Metabolic Panel:  Recent Labs Lab 12/21/14 1035 12/21/14 2005 12/22/14 0412 12/22/14 1300 12/23/14 1110 12/24/14 0400  NA 134*  --  134*  --  133* 134*  K 4.1  --  3.6  --  4.2 4.0  CL 98*  --  95*  --  95* 96*  CO2 24  --  28  --  28 27  GLUCOSE 120*  --  138*  --  106* 127*  BUN 9  --  8  --  12 14  CREATININE 0.85  --  0.84  --  1.08 0.96  CALCIUM 8.8*  --  8.8*  --  9.1 9.0  MG  --  1.8  --  1.8  --   --    Liver Function Tests:  Recent Labs Lab 12/21/14 1205  AST 57*  ALT 69*  ALKPHOS 93  BILITOT 1.0  PROT 6.9  ALBUMIN 3.8    Recent Labs Lab 12/21/14 1205  LIPASE 28   CBC:  Recent Labs Lab 12/21/14 1035 12/22/14 0412  WBC 10.3 9.0  HGB 12.9* 13.1  HCT 38.7* 39.6  MCV 90.0 91.0  PLT 204 190   Cardiac Enzymes:  Recent Labs Lab 12/21/14 2005 12/21/14 2151 12/22/14 0412  TROPONINI 0.03 0.03 <0.03   BNP: BNP (last 3 results)  Recent Labs  12/21/14 1035  BNP 318.2*    ProBNP (last 3  results)  Recent Labs  07/18/14 2156  PROBNP 1325.0*    Signed:  [name]  [facility] 12/24/2014, 1:53 PM

## 2014-12-24 NOTE — Progress Notes (Signed)
Discharge instructions reviewed with patient. All questions answered at this time. RXs given to patient. Patient will be transport home by family once Magnesium Sulfate completed.    Ave Filter, RN

## 2014-12-24 NOTE — Progress Notes (Signed)
Patient Name: Harold Scott      SUBJECTIVE:hx of PAF with RFCA 2/16 and post operative frequent recurrences despite dofetilitde assoc with RVR and now presenting wht SOB and DOE with edema abd distension and orthopnea  Tel recurent AFIb with RVR although not notably in AFib at the time of decompensation  Much better  following diuresis; breathing back to baseline  Home med is metoprolol succ  Echoj normal LV function and modest LA enlargement  Past Medical History  Diagnosis Date  . Anxiety   . Depression   . GERD (gastroesophageal reflux disease)   . Gallstones   . Asthma   . PONV (postoperative nausea and vomiting)   . Paroxysmal atrial fibrillation     a.  previously intolerant of Flecainide and Multaq b. PVI by Dr [name] 09/21/14  . Obstructive sleep apnea     a. on CPAP, followed by [facility]    Scheduled Meds:  Scheduled Meds: . apixaban  5 mg Oral BID  . cholestyramine  8 g Oral QAC breakfast  . citalopram  10 mg Oral Daily  . clorazepate  7.5 mg Oral QHS  . dofetilide  500 mcg Oral BID  . furosemide  40 mg Intravenous BID  . metoprolol succinate  25 mg Oral BID  . pantoprazole  80 mg Oral Q1200  . sodium chloride  3 mL Intravenous Q12H   Continuous Infusions:  sodium chloride, acetaminophen, albuterol, sodium chloride, traMADol    PHYSICAL EXAM Filed Vitals:   12/23/14 1400 12/23/14 1712 12/23/14 2052 12/24/14 0507  BP: 123/35 102/60 122/66 108/52  Pulse: 64 64 64 56  Temp: 98.1 F (36.7 C) 98.4 F (36.9 C) 98.6 F (37 C) 98 F (36.7 C)  TempSrc: Oral Oral Oral Oral  Resp: 18 18 19 20   Height:      Weight:   279 lb 8 oz (126.78 kg)   SpO2: 96% 97% 98% 94%    .pes Well developed and Morbidly obese male  in mild distress HENT normal Neck supple with JVP 8 Clear Irregularly irregular rate and rhythm, no murmurs or gallops Abd-soft with active BS No Clubbing cyanosi  No  edema Skin-warm and dry A & Oriented  Grossly normal sensory  and motor function   TELEMETRY: Reviewed telemetry pt in  afib RVR converted from sinus this am   Intake/Output Summary (Last 24 hours) at 12/24/14 0716 Last data filed at 12/24/14 0507  Gross per 24 hour  Intake    840 ml  Output    858 ml  Net    -18 ml    LABS: Basic Metabolic Panel:  Recent Labs Lab 12/21/14 1035 12/21/14 2005 12/22/14 0412 12/22/14 1300 12/23/14 1110 12/24/14 0400  NA 134*  --  134*  --  133* 134*  K 4.1  --  3.6  --  4.2 4.0  CL 98*  --  95*  --  95* 96*  CO2 24  --  28  --  28 27  GLUCOSE 120*  --  138*  --  106* 127*  BUN 9  --  8  --  12 14  CREATININE 0.85  --  0.84  --  1.08 0.96  CALCIUM 8.8*  --  8.8*  --  9.1 9.0  MG  --  1.8  --  1.8  --   --    Cardiac Enzymes:  Recent Labs  12/21/14 2005 12/21/14 2151 12/22/14 5188  TROPONINI 0.03 0.03 <0.03   CBC:  Recent Labs Lab 12/21/14 1035 12/22/14 0412  WBC 10.3 9.0  HGB 12.9* 13.1  HCT 38.7* 39.6  MCV 90.0 91.0  PLT 204 190   PROTIME: No results for input(s): LABPROT, INR in the last 72 hours. Liver Function Tests:  Recent Labs  12/21/14 1205  AST 57*  ALT 69*  ALKPHOS 93  BILITOT 1.0  PROT 6.9  ALBUMIN 3.8    Recent Labs  12/21/14 1205  LIPASE 28   BNP: BNP (last 3 results)  Recent Labs  12/21/14 1035  BNP 318.2*    ProBNP (last 3 results)  Recent Labs  07/18/14 2156  PROBNP 1325.0*      ASSESSMENT AND PLAN:  Principal Problem:   Acute respiratory failure Active Problems:   PAF (paroxysmal atrial fibrillation)   Essential hypertension   Obstructive sleep apnea   Hypoxia   Acute on chronic diastolic CHF (congestive heart failure)   SOB (shortness of breath)   Pulmonary infiltrates   PNA (pneumonia)   Morbid obesity   Acute respiratory failure with hypoxia  Pt better Will sign off Has followup  With JA on Monday Diuretic and K orders written as oral  Off O2   Signed, Virl Axe MD  12/24/2014

## 2014-12-27 ENCOUNTER — Ambulatory Visit (INDEPENDENT_AMBULATORY_CARE_PROVIDER_SITE_OTHER): Payer: 59 | Admitting: Internal Medicine

## 2014-12-27 ENCOUNTER — Encounter: Payer: Self-pay | Admitting: Internal Medicine

## 2014-12-27 ENCOUNTER — Encounter: Payer: Self-pay | Admitting: *Deleted

## 2014-12-27 VITALS — BP 102/86 | HR 99 | Ht 69.0 in | Wt 280.0 lb

## 2014-12-27 DIAGNOSIS — E669 Obesity, unspecified: Secondary | ICD-10-CM | POA: Diagnosis not present

## 2014-12-27 DIAGNOSIS — G4733 Obstructive sleep apnea (adult) (pediatric): Secondary | ICD-10-CM

## 2014-12-27 DIAGNOSIS — I480 Paroxysmal atrial fibrillation: Secondary | ICD-10-CM | POA: Diagnosis not present

## 2014-12-27 DIAGNOSIS — I10 Essential (primary) hypertension: Secondary | ICD-10-CM

## 2014-12-27 LAB — CBC WITH DIFFERENTIAL/PLATELET
Basophils Absolute: 0 10*3/uL (ref 0.0–0.1)
Basophils Relative: 0.3 % (ref 0.0–3.0)
Eosinophils Absolute: 0.2 10*3/uL (ref 0.0–0.7)
Eosinophils Relative: 1.4 % (ref 0.0–5.0)
HCT: 44.3 % (ref 39.0–52.0)
Hemoglobin: 15.2 g/dL (ref 13.0–17.0)
LYMPHS ABS: 2.6 10*3/uL (ref 0.7–4.0)
Lymphocytes Relative: 23.1 % (ref 12.0–46.0)
MCHC: 34.4 g/dL (ref 30.0–36.0)
MCV: 88.3 fl (ref 78.0–100.0)
MONOS PCT: 8 % (ref 3.0–12.0)
Monocytes Absolute: 0.9 10*3/uL (ref 0.1–1.0)
Neutro Abs: 7.7 10*3/uL (ref 1.4–7.7)
Neutrophils Relative %: 67.2 % (ref 43.0–77.0)
PLATELETS: 328 10*3/uL (ref 150.0–400.0)
RBC: 5.01 Mil/uL (ref 4.22–5.81)
RDW: 13.8 % (ref 11.5–15.5)
WBC: 11.5 10*3/uL — AB (ref 4.0–10.5)

## 2014-12-27 LAB — BASIC METABOLIC PANEL
BUN: 16 mg/dL (ref 6–23)
CHLORIDE: 97 meq/L (ref 96–112)
CO2: 29 mEq/L (ref 19–32)
Calcium: 9.7 mg/dL (ref 8.4–10.5)
Creatinine, Ser: 1.01 mg/dL (ref 0.40–1.50)
GFR: 82.06 mL/min (ref >60.00)
Glucose, Bld: 109 mg/dL — ABNORMAL HIGH (ref 70–99)
Potassium: 4.8 mEq/L (ref 3.5–5.1)
Sodium: 134 mEq/L — ABNORMAL LOW (ref 135–145)

## 2014-12-27 NOTE — Progress Notes (Signed)
Electrophysiology Office Note   Date:  12/27/2014   ID:  Harold Scott, DOB 1960-08-28, MRN [id number]  PCP:  [name], MD   Primary Electrophysiologist: Athol Bolds   Chief Complaint  Patient presents with  . PAF     History of Present Illness: Harold Scott is a 54 y.o. male who presents today for electrophysiology evaluation.   His afib continues to increase in both frequency and duration despite recent ablation.  He finds this disturbing with symptoms of SOB, palpitations, and fatigue.Marland Kitchen  He is unaware of triggers/ precipitants.  He reports compliance with tikosyn.  He has had very little success with weight recuction.  He is compliant with CPAP.   Today, he denies symptoms of  chest pain, orthopnea, PND, lower extremity edema, claudication, dizziness, presyncope, syncope, bleeding, or neurologic sequela. The patient is tolerating medications without difficulties and is otherwise without complaint today.    Past Medical History  Diagnosis Date  . Anxiety   . Depression   . GERD (gastroesophageal reflux disease)   . Gallstones   . Asthma   . PONV (postoperative nausea and vomiting)   . Paroxysmal atrial fibrillation     a.  previously intolerant of Flecainide and Multaq b. PVI by Dr [name] 09/21/14  . Obstructive sleep apnea     a. on CPAP, followed by [facility]   Past Surgical History  Procedure Laterality Date  . Hand surgery    . Tonsillectomy    . Wisdom tooth extraction    . Cholecystectomy  08/05/2012    Procedure: LAPAROSCOPIC CHOLECYSTECTOMY WITH INTRAOPERATIVE CHOLANGIOGRAM;  Surgeon: [name], MD,FACS;  Location: [facility];  Service: General;  Laterality: N/A;  . Laparoscopic appendectomy N/A 02/13/2014    Procedure: APPENDECTOMY LAPAROSCOPIC;  Surgeon: [name], MD;  Location: [facility];  Service: General;  Laterality: N/A;  . Tee without cardioversion N/A 09/20/2014    Procedure: TRANSESOPHAGEAL ECHOCARDIOGRAM (TEE);  Surgeon: [name], MD;   Location: [facility];  Service: Cardiovascular;  Laterality: N/A;  . Ablation  09/21/14    PVI by Dr [name]  . Atrial fibrillation ablation N/A 09/21/2014    Procedure: ATRIAL FIBRILLATION ABLATION;  Surgeon: [name], MD;  Location: [facility] CATH LAB;  Service: Cardiovascular;  Laterality: N/A;     Current Outpatient Prescriptions  Medication Sig Dispense Refill  . albuterol (PROVENTIL HFA;VENTOLIN HFA) 108 (90 BASE) MCG/ACT inhaler Inhale 1-2 puffs into the lungs every 6 (six) hours as needed for wheezing or shortness of breath.    . cholestyramine (QUESTRAN) 4 G packet TAKE 2 PACKETS DAILY (Patient taking differently: TAKE 2 PACKETS BY MOUTH DAILY) 60 packet 10  . citalopram (CELEXA) 10 MG tablet TAKE 1 TABLET BY MOUTH ONCE DAILY 90 tablet 1  . clorazepate (TRANXENE) 7.5 MG tablet Take 1 tablet (7.5 mg total) by mouth at bedtime. 30 tablet 5  . dofetilide (TIKOSYN) 500 MCG capsule Take 1 capsule (500 mcg total) by mouth 2 (two) times daily. 14 capsule 0  . ELIQUIS 5 MG TABS tablet TAKE 1 TABLET TWICE A DAY (Patient taking differently: TAKE 1 TABLET BY MOUTH TWICE A DAY) 60 tablet 3  . esomeprazole (NEXIUM) 40 MG capsule Take 40 mg by mouth daily at 12 noon.    . furosemide (LASIX) 40 MG tablet Take 1 tablet (40 mg total) by mouth daily. 30 tablet 1  . guaiFENesin 200 MG tablet Take 200 mg by mouth 2 (two) times daily as needed (for  cold).    . losartan (COZAAR) 50 MG tablet TAKE 1 TABLET (50 MG TOTAL) BY MOUTH DAILY. 90 tablet 0  . metoprolol succinate (TOPROL-XL) 25 MG 24 hr tablet Take 1 tablet (25 mg total) by mouth 2 (two) times daily. 60 tablet 1  . potassium chloride SA (K-DUR,KLOR-CON) 20 MEQ tablet Take 1 tablet (20 mEq total) by mouth daily. 30 tablet 1  . simvastatin (ZOCOR) 20 MG tablet Take 1 tablet (20 mg total) by mouth daily at 6 PM. 30 tablet 11  . traMADol (ULTRAM) 50 MG tablet Take 1 tablet (50 mg total) by mouth every 8 (eight) hours as needed for moderate pain. 40 tablet 0     No current facility-administered medications for this visit.    Allergies:   Flecainide; Multaq; and Penicillins   Social History:  The patient  reports that he quit smoking about 8 years ago. His smoking use included Cigarettes. He has a 15 pack-year smoking history. He has never used smokeless tobacco. He reports that he drinks about 4.2 - 8.4 oz of alcohol per week. He reports that he does not use illicit drugs.   Family History:  Adopted and does not know FH    ROS:  Please see the history of present illness.     All other systems are reviewed and negative.    PHYSICAL EXAM: VS:  BP 102/86 mmHg  Pulse 99  Ht 5\' 9"  (1.753 m)  Wt 280 lb (127.007 kg)  BMI 41.33 kg/m2 , BMI Body mass index is 41.33 kg/(m^2). GEN: overweight, in no acute distress HEENT: normal Neck: no JVD, carotid bruits, or masses Cardiac: iRRR; no murmurs, rubs, or gallops,no edema  Respiratory:  clear to auscultation bilaterally, normal work of breathing GI: soft, nontender, nondistended, + BS MS: no deformity or atrophy Skin: warm and dry,  Neuro:  Strength and sensation are intact Psych: euthymic mood, full affect  EKG:  EKG is ordered today. The ekg ordered today shows afib   Recent Labs: 07/18/2014: Pro B Natriuretic peptide (BNP) 1325.0* 07/20/2014: TSH 5.500* 12/21/2014: ALT 69*; B Natriuretic Peptide 318.2* 12/22/2014: Magnesium 1.8 12/27/2014: BUN 16; Creatinine 1.01; Hemoglobin 15.2; Platelets 328.0; Potassium 4.8; Sodium 134*    Lipid Panel     Component Value Date/Time   CHOL 228* 07/19/2014 0425   TRIG 338* 07/19/2014 0425   HDL 30* 07/19/2014 0425   CHOLHDL 7.6 07/19/2014 0425   VLDL 68* 07/19/2014 0425   LDLCALC 130* 07/19/2014 0425     Wt Readings from Last 3 Encounters:  12/27/14 280 lb (127.007 kg)  12/23/14 279 lb 8 oz (126.78 kg)  09/28/14 280 lb (127.007 kg)     ASSESSMENT AND PLAN:  1.  Paroxysmal atrial fibrillation Unfortunately his afib continues to worsen despite  ablation.  He does not tolerate this well.  He has failed medical therapy. Therapeutic strategies for afib including medicine and repeat ablation were discussed in detail with the patient today. Risk, benefits, and alternatives to EP study and radiofrequency ablation for afib were also discussed in detail today. These risks include but are not limited to stroke, bleeding, vascular damage, tamponade, perforation, damage to the esophagus, lungs, and other structures, pulmonary vein stenosis, worsening renal function, and death. The patient understands these risk and wishes to proceed.  We will therefore proceed with repeat catheter ablation at the next available time.  He will have a TEE prior to the procedure to exclude LA thrombus. chads2vasc is 0.  He is  on eliquis and will need to remain on this 3 months post ablation.  If he fails repeat ablation then our options would be amiodarone or epicardial maze/ hybrid ablation.  2. OSA Compliance with CPAP is encouraged  3. Overweight. I have reviewed the patients BMI and decreased success rates with ablation at length today.  Weight loss is strongly advised.  Systems developer and ARREST AF study results were discussed at length and the importance of lifestyle changes was stressed. Per Guijian et al (PACE 2013; 36: 015-868), patients with BMI 25-29.9 (obese) have a 27% increase in AF recurrence post ablation.  Patients with BMI >30 have a 31% increase in AF recurrence post ablation when compared to those with BMI <25.   Current medicines are reviewed at length with the patient today.   The patient is does not have concerns regarding his medicines.      Army Fossa, MD  12/27/2014 5:33 PM      [place] [address] [phone] (office) [phone] (fax)

## 2014-12-27 NOTE — Patient Instructions (Addendum)
Medication Instructions:  Your physician recommends that you continue on your current medications as directed. Please refer to the Current Medication list given to you today.   Labwork: Your physician recommends that you return for lab work today BMP/CBC  Testing/Procedures: Your physician has requested that you have cardiac CT. Cardiac computed tomography (CT) is a painless test that uses an x-ray machine to take clear, detailed pictures of your heart. For further information please visit [employer]. Please follow instruction sheet as given.   Your physician has recommended that you have an ablation. Catheter ablation is a medical procedure used to treat some cardiac arrhythmias (irregular heartbeats). During catheter ablation, a long, thin, flexible tube is put into a blood vessel in your groin (upper thigh), or neck. This tube is called an ablation catheter. It is then guided to your heart through the blood vessel. Radio frequency waves destroy small areas of heart tissue where abnormal heartbeats may cause an arrhythmia to start. Please see the instruction sheet given to you today.    Follow-Up: Your physician recommends that you schedule a follow-up appointment after the ablation    Any Other Special Instructions Will Be Listed Below (If Applicable).

## 2014-12-28 LAB — CULTURE, BLOOD (ROUTINE X 2)
CULTURE: NO GROWTH
Culture: NO GROWTH

## 2015-01-03 ENCOUNTER — Telehealth: Payer: Self-pay | Admitting: Internal Medicine

## 2015-01-03 NOTE — Telephone Encounter (Signed)
New Message    Patient would like a call back in regards to his medication before he goes in for his ablation. Please give patient a call back.

## 2015-01-03 NOTE — Telephone Encounter (Signed)
Left message for patient to return my call.

## 2015-01-03 NOTE — Telephone Encounter (Signed)
Okay to take his Tikosyn

## 2015-01-07 ENCOUNTER — Telehealth: Payer: Self-pay | Admitting: Internal Medicine

## 2015-01-07 ENCOUNTER — Ambulatory Visit (HOSPITAL_COMMUNITY): Admission: RE | Admit: 2015-01-07 | Payer: 59 | Source: Ambulatory Visit

## 2015-01-07 ENCOUNTER — Ambulatory Visit (HOSPITAL_COMMUNITY)
Admission: RE | Admit: 2015-01-07 | Discharge: 2015-01-07 | Disposition: A | Discharge status: Home / Self Care | Payer: 59 | Source: Ambulatory Visit | Attending: Internal Medicine | Admitting: Internal Medicine

## 2015-01-07 ENCOUNTER — Encounter (HOSPITAL_COMMUNITY): Payer: Self-pay

## 2015-01-07 DIAGNOSIS — I480 Paroxysmal atrial fibrillation: Secondary | ICD-10-CM

## 2015-01-07 DIAGNOSIS — I2510 Atherosclerotic heart disease of native coronary artery without angina pectoris: Secondary | ICD-10-CM | POA: Insufficient documentation

## 2015-01-07 DIAGNOSIS — R911 Solitary pulmonary nodule: Secondary | ICD-10-CM | POA: Diagnosis not present

## 2015-01-07 MED ORDER — IOHEXOL 350 MG/ML SOLN
80.0000 mL | Freq: Once | INTRAVENOUS | Status: AC | PRN
  Administered 2015-01-07: 80 mL via INTRAVENOUS

## 2015-01-07 NOTE — Telephone Encounter (Signed)
Machine back up and patient will be at the hospital at 10:45.

## 2015-01-07 NOTE — Telephone Encounter (Signed)
New message      Pt is having an ablation on Tuesday, a TEE on Monday.  He was supposed to have a CT today at [facility].  They called him and told him the machine was down.  Will he still be able to have his ablation if he does not get the CT?

## 2015-01-10 ENCOUNTER — Ambulatory Visit (HOSPITAL_COMMUNITY)
Admission: RE | Admit: 2015-01-10 | Discharge: 2015-01-10 | Disposition: A | Discharge status: Home / Self Care | Payer: 59 | Source: Ambulatory Visit | Attending: Cardiovascular Disease | Admitting: Cardiovascular Disease

## 2015-01-10 ENCOUNTER — Encounter (HOSPITAL_COMMUNITY)
Admission: RE | Disposition: A | Discharge status: Home / Self Care | Payer: 59 | Source: Ambulatory Visit | Attending: Cardiovascular Disease

## 2015-01-10 DIAGNOSIS — E663 Overweight: Secondary | ICD-10-CM | POA: Insufficient documentation

## 2015-01-10 DIAGNOSIS — Z880 Allergy status to penicillin: Secondary | ICD-10-CM | POA: Insufficient documentation

## 2015-01-10 DIAGNOSIS — K219 Gastro-esophageal reflux disease without esophagitis: Secondary | ICD-10-CM | POA: Insufficient documentation

## 2015-01-10 DIAGNOSIS — Z87891 Personal history of nicotine dependence: Secondary | ICD-10-CM | POA: Insufficient documentation

## 2015-01-10 DIAGNOSIS — I517 Cardiomegaly: Secondary | ICD-10-CM | POA: Diagnosis not present

## 2015-01-10 DIAGNOSIS — J45909 Unspecified asthma, uncomplicated: Secondary | ICD-10-CM | POA: Insufficient documentation

## 2015-01-10 DIAGNOSIS — Z9049 Acquired absence of other specified parts of digestive tract: Secondary | ICD-10-CM | POA: Insufficient documentation

## 2015-01-10 DIAGNOSIS — Q211 Atrial septal defect: Secondary | ICD-10-CM | POA: Insufficient documentation

## 2015-01-10 DIAGNOSIS — F419 Anxiety disorder, unspecified: Secondary | ICD-10-CM | POA: Diagnosis not present

## 2015-01-10 DIAGNOSIS — Z6841 Body Mass Index (BMI) 40.0 and over, adult: Secondary | ICD-10-CM | POA: Diagnosis not present

## 2015-01-10 DIAGNOSIS — I253 Aneurysm of heart: Secondary | ICD-10-CM | POA: Insufficient documentation

## 2015-01-10 DIAGNOSIS — I480 Paroxysmal atrial fibrillation: Secondary | ICD-10-CM | POA: Insufficient documentation

## 2015-01-10 DIAGNOSIS — I340 Nonrheumatic mitral (valve) insufficiency: Secondary | ICD-10-CM | POA: Diagnosis not present

## 2015-01-10 DIAGNOSIS — Z888 Allergy status to other drugs, medicaments and biological substances status: Secondary | ICD-10-CM | POA: Insufficient documentation

## 2015-01-10 DIAGNOSIS — G4733 Obstructive sleep apnea (adult) (pediatric): Secondary | ICD-10-CM | POA: Insufficient documentation

## 2015-01-10 DIAGNOSIS — F329 Major depressive disorder, single episode, unspecified: Secondary | ICD-10-CM | POA: Insufficient documentation

## 2015-01-10 HISTORY — PX: TEE WITHOUT CARDIOVERSION: SHX5443

## 2015-01-10 SURGERY — ECHOCARDIOGRAM, TRANSESOPHAGEAL
Anesthesia: Moderate Sedation

## 2015-01-10 MED ORDER — SODIUM CHLORIDE 0.9 % IV SOLN: INTRAVENOUS | Status: DC

## 2015-01-10 MED ORDER — MIDAZOLAM HCL 5 MG/ML IJ SOLN
INTRAMUSCULAR | Status: AC
  Filled 2015-01-10: qty 2

## 2015-01-10 MED ORDER — FENTANYL CITRATE (PF) 100 MCG/2ML IJ SOLN
INTRAMUSCULAR | Status: AC
  Filled 2015-01-10: qty 2

## 2015-01-10 MED ORDER — DIPHENHYDRAMINE HCL 50 MG/ML IJ SOLN
INTRAMUSCULAR | Status: AC
  Filled 2015-01-10: qty 1

## 2015-01-10 MED ORDER — MIDAZOLAM HCL 10 MG/2ML IJ SOLN
INTRAMUSCULAR | Status: DC | PRN
  Administered 2015-01-10 (×5): 2 mg via INTRAVENOUS

## 2015-01-10 MED ORDER — DIPHENHYDRAMINE HCL 50 MG/ML IJ SOLN
INTRAMUSCULAR | Status: DC | PRN
  Administered 2015-01-10: 25 mg via INTRAVENOUS

## 2015-01-10 MED ORDER — FENTANYL CITRATE (PF) 100 MCG/2ML IJ SOLN
INTRAMUSCULAR | Status: DC | PRN
  Administered 2015-01-10 (×2): 25 ug via INTRAVENOUS
  Administered 2015-01-10: 50 ug via INTRAVENOUS
  Administered 2015-01-10 (×3): 25 ug via INTRAVENOUS

## 2015-01-10 NOTE — Discharge Instructions (Signed)
Transesophageal Echocardiogram °Transesophageal echocardiography (TEE) is a picture test of your heart using sound waves. The pictures taken can give very detailed pictures of your heart. This can help your doctor see if there are problems with your heart. TEE can check: °· If your heart has blood clots in it. °· How well your heart valves are working. °· If you have an infection on the inside of your heart. °· Some of the major arteries of your heart. °· If your heart valve is working after a repair. °· Your heart before a procedure that uses a shock to your heart to get the rhythm back to normal. °BEFORE THE PROCEDURE °· Do not eat or drink for 6 hours before the procedure or as told by your doctor. °· Make plans to have someone drive you home after the procedure. Do not drive yourself home. °· An IV tube will be put in your arm. °PROCEDURE °· You will be given a medicine to help you relax (sedative). It will be given through the IV tube. °· A numbing medicine will be sprayed or gargled in the back of your throat to help numb it. °· The tip of the probe is placed into the back of your mouth. You will be asked to swallow. This helps to pass the probe into your esophagus. °· Once the tip of the probe is in the right place, your doctor can take pictures of your heart. °· You may feel pressure at the back of your throat. °AFTER THE PROCEDURE °· You will be taken to a recovery area so the sedative can wear off. °· Your throat may be sore and scratchy. This will go away slowly over time. °· You will go home when you are fully awake and able to swallow liquids. °· You should have someone stay with you for the next 24 hours. °· Do not drive or operate machinery for the next 24 hours. °Document Released: 06/03/2009 Document Revised: 08/11/2013 Document Reviewed: 02/05/2013 °[employer]® Patient Information ©2015 [employer]. This information is not intended to replace advice given to you by your health care provider. Make  sure you discuss any questions you have with your health care provider. ° °

## 2015-01-10 NOTE — Op Note (Signed)
INDICATIONS: atrial fibrillation  PROCEDURE:   Informed consent was obtained prior to the procedure. The risks, benefits and alternatives for the procedure were discussed and the patient comprehended these risks.  Risks include, but are not limited to, cough, sore throat, vomiting, nausea, somnolence, esophageal and stomach trauma or perforation, bleeding, low blood pressure, aspiration, pneumonia, infection, trauma to the teeth and death.    After a procedural time-out, the oropharynx was anesthetized with 20% benzocaine spray. The patient was given 25 mg diphenhydramine, 10 mg versed and 150 mcg fentanyl for moderate sedation.   The transesophageal probe was inserted in the esophagus and stomach without difficulty and multiple views were obtained.  The patient was kept under observation until the patient left the procedure room.  The patient left the procedure room in stable condition.   Agitated microbubble saline contrast was not administered.  COMPLICATIONS:    There were no immediate complications.  FINDINGS:  Moderately dialted left atrium. Spontaneous echo contrast and very low appendage emptying velocities (in sinus rhythm), but no thrombus is seen. Moderate asymmetrical LVH. LVEF>70%. Systolic anterior motion of the anterior mitral leaflet. Mild to moderate mitral insufficiency. No significant LV outflow gradient. Aneurysmal atrial septum. Small PFO and small residual transseptal puncture with exclusively left to right shunt.  RECOMMENDATIONS:   Proceed with atrial fibrillation ablation. 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 Consider evaluation for LVOT gradient by TTE with Valsalva meaneuver.  Time Spent Directly with the Patient:  30 minutes   Clif Serio 01/10/2015, 8:36 AM

## 2015-01-10 NOTE — Interval H&P Note (Signed)
History and Physical Interval Note:  01/10/2015 8:01 AM  Harold Scott  has presented today for surgery, with the diagnosis of AFIB  The various methods of treatment have been discussed with the patient and family. After consideration of risks, benefits and other options for treatment, the patient has consented to  Procedure(s): TRANSESOPHAGEAL ECHOCARDIOGRAM (TEE) (N/A) as a surgical intervention .  The patient's history has been reviewed, patient examined, no change in status, stable for surgery.  I have reviewed the patient's chart and labs.  Questions were answered to the patient's satisfaction.     Jabori Henegar

## 2015-01-10 NOTE — Progress Notes (Signed)
Echocardiogram Echocardiogram Transesophageal has been performed.  Joelene Millin 01/10/2015, 8:54 AM

## 2015-01-10 NOTE — H&P (View-Only) (Signed)
Electrophysiology Office Note   Date:  12/27/2014   ID:  Harold Scott, DOB 17-May-1961, MRN [id number]  PCP:  [name], MD   Primary Electrophysiologist: Verneda Hollopeter   Chief Complaint  Patient presents with  . PAF     History of Present Illness: Harold Scott is a 54 y.o. male who presents today for electrophysiology evaluation.   His afib continues to increase in both frequency and duration despite recent ablation.  He finds this disturbing with symptoms of SOB, palpitations, and fatigue.Marland Kitchen  He is unaware of triggers/ precipitants.  He reports compliance with tikosyn.  He has had very little success with weight recuction.  He is compliant with CPAP.   Today, he denies symptoms of  chest pain, orthopnea, PND, lower extremity edema, claudication, dizziness, presyncope, syncope, bleeding, or neurologic sequela. The patient is tolerating medications without difficulties and is otherwise without complaint today.    Past Medical History  Diagnosis Date  . Anxiety   . Depression   . GERD (gastroesophageal reflux disease)   . Gallstones   . Asthma   . PONV (postoperative nausea and vomiting)   . Paroxysmal atrial fibrillation     a.  previously intolerant of Flecainide and Multaq b. PVI by Dr [name] 09/21/14  . Obstructive sleep apnea     a. on CPAP, followed by [facility]   Past Surgical History  Procedure Laterality Date  . Hand surgery    . Tonsillectomy    . Wisdom tooth extraction    . Cholecystectomy  08/05/2012    Procedure: LAPAROSCOPIC CHOLECYSTECTOMY WITH INTRAOPERATIVE CHOLANGIOGRAM;  Surgeon: [name], MD,FACS;  Location: [facility];  Service: General;  Laterality: N/A;  . Laparoscopic appendectomy N/A 02/13/2014    Procedure: APPENDECTOMY LAPAROSCOPIC;  Surgeon: [name], MD;  Location: [facility];  Service: General;  Laterality: N/A;  . Tee without cardioversion N/A 09/20/2014    Procedure: TRANSESOPHAGEAL ECHOCARDIOGRAM (TEE);  Surgeon: [name], MD;   Location: [facility];  Service: Cardiovascular;  Laterality: N/A;  . Ablation  09/21/14    PVI by Dr [name]  . Atrial fibrillation ablation N/A 09/21/2014    Procedure: ATRIAL FIBRILLATION ABLATION;  Surgeon: [name], MD;  Location: [facility];  Service: Cardiovascular;  Laterality: N/A;     Current Outpatient Prescriptions  Medication Sig Dispense Refill  . albuterol (PROVENTIL HFA;VENTOLIN HFA) 108 (90 BASE) MCG/ACT inhaler Inhale 1-2 puffs into the lungs every 6 (six) hours as needed for wheezing or shortness of breath.    . cholestyramine (QUESTRAN) 4 G packet TAKE 2 PACKETS DAILY (Patient taking differently: TAKE 2 PACKETS BY MOUTH DAILY) 60 packet 10  . citalopram (CELEXA) 10 MG tablet TAKE 1 TABLET BY MOUTH ONCE DAILY 90 tablet 1  . clorazepate (TRANXENE) 7.5 MG tablet Take 1 tablet (7.5 mg total) by mouth at bedtime. 30 tablet 5  . dofetilide (TIKOSYN) 500 MCG capsule Take 1 capsule (500 mcg total) by mouth 2 (two) times daily. 14 capsule 0  . ELIQUIS 5 MG TABS tablet TAKE 1 TABLET TWICE A DAY (Patient taking differently: TAKE 1 TABLET BY MOUTH TWICE A DAY) 60 tablet 3  . esomeprazole (NEXIUM) 40 MG capsule Take 40 mg by mouth daily at 12 noon.    . furosemide (LASIX) 40 MG tablet Take 1 tablet (40 mg total) by mouth daily. 30 tablet 1  . guaiFENesin 200 MG tablet Take 200 mg by mouth 2 (two) times daily as needed (for  cold).    . losartan (COZAAR) 50 MG tablet TAKE 1 TABLET (50 MG TOTAL) BY MOUTH DAILY. 90 tablet 0  . metoprolol succinate (TOPROL-XL) 25 MG 24 hr tablet Take 1 tablet (25 mg total) by mouth 2 (two) times daily. 60 tablet 1  . potassium chloride SA (K-DUR,KLOR-CON) 20 MEQ tablet Take 1 tablet (20 mEq total) by mouth daily. 30 tablet 1  . simvastatin (ZOCOR) 20 MG tablet Take 1 tablet (20 mg total) by mouth daily at 6 PM. 30 tablet 11  . traMADol (ULTRAM) 50 MG tablet Take 1 tablet (50 mg total) by mouth every 8 (eight) hours as needed for moderate pain. 40 tablet 0     No current facility-administered medications for this visit.    Allergies:   Flecainide; Multaq; and Penicillins   Social History:  The patient  reports that he quit smoking about 8 years ago. His smoking use included Cigarettes. He has a 15 pack-year smoking history. He has never used smokeless tobacco. He reports that he drinks about 4.2 - 8.4 oz of alcohol per week. He reports that he does not use illicit drugs.   Family History:  Adopted and does not know FH    ROS:  Please see the history of present illness.     All other systems are reviewed and negative.    PHYSICAL EXAM: VS:  BP 102/86 mmHg  Pulse 99  Ht 5\' 9"  (1.753 m)  Wt 280 lb (127.007 kg)  BMI 41.33 kg/m2 , BMI Body mass index is 41.33 kg/(m^2). GEN: overweight, in no acute distress HEENT: normal Neck: no JVD, carotid bruits, or masses Cardiac: iRRR; no murmurs, rubs, or gallops,no edema  Respiratory:  clear to auscultation bilaterally, normal work of breathing GI: soft, nontender, nondistended, + BS MS: no deformity or atrophy Skin: warm and dry,  Neuro:  Strength and sensation are intact Psych: euthymic mood, full affect  EKG:  EKG is ordered today. The ekg ordered today shows afib   Recent Labs: 07/18/2014: Pro B Natriuretic peptide (BNP) 1325.0* 07/20/2014: TSH 5.500* 12/21/2014: ALT 69*; B Natriuretic Peptide 318.2* 12/22/2014: Magnesium 1.8 12/27/2014: BUN 16; Creatinine 1.01; Hemoglobin 15.2; Platelets 328.0; Potassium 4.8; Sodium 134*    Lipid Panel     Component Value Date/Time   CHOL 228* 07/19/2014 0425   TRIG 338* 07/19/2014 0425   HDL 30* 07/19/2014 0425   CHOLHDL 7.6 07/19/2014 0425   VLDL 68* 07/19/2014 0425   LDLCALC 130* 07/19/2014 0425     Wt Readings from Last 3 Encounters:  12/27/14 280 lb (127.007 kg)  12/23/14 279 lb 8 oz (126.78 kg)  09/28/14 280 lb (127.007 kg)     ASSESSMENT AND PLAN:  1.  Paroxysmal atrial fibrillation Unfortunately his afib continues to worsen despite  ablation.  He does not tolerate this well.  He has failed medical therapy. Therapeutic strategies for afib including medicine and repeat ablation were discussed in detail with the patient today. Risk, benefits, and alternatives to EP study and radiofrequency ablation for afib were also discussed in detail today. These risks include but are not limited to stroke, bleeding, vascular damage, tamponade, perforation, damage to the esophagus, lungs, and other structures, pulmonary vein stenosis, worsening renal function, and death. The patient understands these risk and wishes to proceed.  We will therefore proceed with repeat catheter ablation at the next available time.  He will have a TEE prior to the procedure to exclude LA thrombus. chads2vasc is 0.  He is  on eliquis and will need to remain on this 3 months post ablation.  If he fails repeat ablation then our options would be amiodarone or epicardial maze/ hybrid ablation.  2. OSA Compliance with CPAP is encouraged  3. Overweight. I have reviewed the patients BMI and decreased success rates with ablation at length today.  Weight loss is strongly advised.  Systems developer and ARREST AF study results were discussed at length and the importance of lifestyle changes was stressed. Per Guijian et al (PACE 2013; 36: 409-811), patients with BMI 25-29.9 (obese) have a 27% increase in AF recurrence post ablation.  Patients with BMI >30 have a 31% increase in AF recurrence post ablation when compared to those with BMI <25.   Current medicines are reviewed at length with the patient today.   The patient is does not have concerns regarding his medicines.      Army Fossa, MD  12/27/2014 5:33 PM      [address] [place] [phone] (office) [phone] (fax)

## 2015-01-11 ENCOUNTER — Ambulatory Visit (HOSPITAL_COMMUNITY)
Admission: RE | Admit: 2015-01-11 | Discharge: 2015-01-12 | Disposition: A | Discharge status: Home / Self Care | Payer: 59 | Source: Ambulatory Visit | Attending: Internal Medicine | Admitting: Internal Medicine

## 2015-01-11 ENCOUNTER — Ambulatory Visit (HOSPITAL_COMMUNITY): Payer: 59 | Admitting: Anesthesiology

## 2015-01-11 ENCOUNTER — Encounter (HOSPITAL_COMMUNITY)
Admission: RE | Disposition: A | Discharge status: Home / Self Care | Payer: 59 | Source: Ambulatory Visit | Attending: Internal Medicine

## 2015-01-11 ENCOUNTER — Encounter (HOSPITAL_COMMUNITY): Payer: Self-pay | Admitting: Cardiovascular Disease

## 2015-01-11 DIAGNOSIS — Z79899 Other long term (current) drug therapy: Secondary | ICD-10-CM | POA: Diagnosis not present

## 2015-01-11 DIAGNOSIS — I481 Persistent atrial fibrillation: Secondary | ICD-10-CM | POA: Insufficient documentation

## 2015-01-11 DIAGNOSIS — E669 Obesity, unspecified: Secondary | ICD-10-CM | POA: Insufficient documentation

## 2015-01-11 DIAGNOSIS — F329 Major depressive disorder, single episode, unspecified: Secondary | ICD-10-CM | POA: Diagnosis not present

## 2015-01-11 DIAGNOSIS — Z87891 Personal history of nicotine dependence: Secondary | ICD-10-CM | POA: Diagnosis not present

## 2015-01-11 DIAGNOSIS — J45909 Unspecified asthma, uncomplicated: Secondary | ICD-10-CM | POA: Diagnosis not present

## 2015-01-11 DIAGNOSIS — I509 Heart failure, unspecified: Secondary | ICD-10-CM | POA: Insufficient documentation

## 2015-01-11 DIAGNOSIS — K219 Gastro-esophageal reflux disease without esophagitis: Secondary | ICD-10-CM | POA: Diagnosis not present

## 2015-01-11 DIAGNOSIS — G4733 Obstructive sleep apnea (adult) (pediatric): Secondary | ICD-10-CM | POA: Insufficient documentation

## 2015-01-11 DIAGNOSIS — I10 Essential (primary) hypertension: Secondary | ICD-10-CM | POA: Insufficient documentation

## 2015-01-11 DIAGNOSIS — I4891 Unspecified atrial fibrillation: Secondary | ICD-10-CM | POA: Diagnosis present

## 2015-01-11 DIAGNOSIS — I480 Paroxysmal atrial fibrillation: Secondary | ICD-10-CM | POA: Diagnosis not present

## 2015-01-11 HISTORY — PX: ELECTROPHYSIOLOGIC STUDY: SHX172A

## 2015-01-11 LAB — BASIC METABOLIC PANEL
Anion gap: 10 (ref 5–15)
BUN: 8 mg/dL (ref 6–20)
CHLORIDE: 99 mmol/L — AB (ref 101–111)
CO2: 25 mmol/L (ref 22–32)
Calcium: 8.9 mg/dL (ref 8.9–10.3)
Creatinine, Ser: 0.88 mg/dL (ref 0.61–1.24)
GFR calc Af Amer: >60 mL/min (ref >60)
Glucose, Bld: 134 mg/dL — ABNORMAL HIGH (ref 65–99)
Potassium: 4.2 mmol/L (ref 3.5–5.1)
SODIUM: 134 mmol/L — AB (ref 135–145)

## 2015-01-11 LAB — POCT ACTIVATED CLOTTING TIME
Activated Clotting Time: 251 seconds
Activated Clotting Time: 251 seconds
Activated Clotting Time: 257 seconds
Activated Clotting Time: 147 seconds

## 2015-01-11 LAB — CBC
HCT: 37.5 % — ABNORMAL LOW (ref 39.0–52.0)
Hemoglobin: 12.6 g/dL — ABNORMAL LOW (ref 13.0–17.0)
MCH: 30.4 pg (ref 26.0–34.0)
MCHC: 33.6 g/dL (ref 30.0–36.0)
MCV: 90.6 fL (ref 78.0–100.0)
PLATELETS: 201 10*3/uL (ref 150–400)
RBC: 4.14 MIL/uL — AB (ref 4.22–5.81)
RDW: 13.5 % (ref 11.5–15.5)
WBC: 8.5 10*3/uL (ref 4.0–10.5)

## 2015-01-11 LAB — MRSA PCR SCREENING: MRSA by PCR: NEGATIVE

## 2015-01-11 SURGERY — ATRIAL FIBRILLATION ABLATION
Anesthesia: General

## 2015-01-11 MED ORDER — HEPARIN SODIUM (PORCINE) 1000 UNIT/ML IJ SOLN
INTRAMUSCULAR | Status: DC | PRN
  Administered 2015-01-11 (×2): 4000 [IU] via INTRAVENOUS

## 2015-01-11 MED ORDER — CITALOPRAM HYDROBROMIDE 10 MG PO TABS: 10.0000 mg | ORAL_TABLET | Freq: Every day | ORAL | Status: DC

## 2015-01-11 MED ORDER — EPHEDRINE SULFATE 50 MG/ML IJ SOLN: INTRAMUSCULAR | Status: DC | PRN

## 2015-01-11 MED ORDER — EPHEDRINE SULFATE 50 MG/ML IJ SOLN
INTRAMUSCULAR | Status: DC | PRN
  Administered 2015-01-11 (×2): 5 mg via INTRAVENOUS
  Administered 2015-01-11: 10 mg via INTRAVENOUS

## 2015-01-11 MED ORDER — ONDANSETRON HCL 4 MG/2ML IJ SOLN: 4.0000 mg | Freq: Four times a day (QID) | INTRAMUSCULAR | Status: DC | PRN

## 2015-01-11 MED ORDER — ROCURONIUM BROMIDE 100 MG/10ML IV SOLN
INTRAVENOUS | Status: DC | PRN
  Administered 2015-01-11: 10 mg via INTRAVENOUS
  Administered 2015-01-11: 30 mg via INTRAVENOUS
  Administered 2015-01-11 (×2): 10 mg via INTRAVENOUS

## 2015-01-11 MED ORDER — PROPOFOL 10 MG/ML IV BOLUS
INTRAVENOUS | Status: DC | PRN
  Administered 2015-01-11: 40 mg via INTRAVENOUS
  Administered 2015-01-11: 160 mg via INTRAVENOUS

## 2015-01-11 MED ORDER — GLYCOPYRROLATE 0.2 MG/ML IJ SOLN
INTRAMUSCULAR | Status: DC | PRN
  Administered 2015-01-11: 0.6 mg via INTRAVENOUS

## 2015-01-11 MED ORDER — CITALOPRAM HYDROBROMIDE 10 MG PO TABS
10.0000 mg | ORAL_TABLET | Freq: Every day | ORAL | Status: DC
  Administered 2015-01-11: 10 mg via ORAL
  Filled 2015-01-11 (×2): qty 1

## 2015-01-11 MED ORDER — PANTOPRAZOLE SODIUM 40 MG PO TBEC
40.0000 mg | DELAYED_RELEASE_TABLET | Freq: Every day | ORAL | Status: DC
  Administered 2015-01-11: 40 mg via ORAL
  Filled 2015-01-11: qty 1

## 2015-01-11 MED ORDER — PROTAMINE SULFATE 10 MG/ML IV SOLN
INTRAVENOUS | Status: DC | PRN
  Administered 2015-01-11: 30 mg via INTRAVENOUS

## 2015-01-11 MED ORDER — BUPIVACAINE HCL (PF) 0.25 % IJ SOLN
INTRAMUSCULAR | Status: AC
  Filled 2015-01-11: qty 30

## 2015-01-11 MED ORDER — SUCCINYLCHOLINE CHLORIDE 20 MG/ML IJ SOLN
INTRAMUSCULAR | Status: DC | PRN
  Administered 2015-01-11: 100 mg via INTRAVENOUS

## 2015-01-11 MED ORDER — FENTANYL CITRATE (PF) 100 MCG/2ML IJ SOLN: 25.0000 ug | INTRAMUSCULAR | Status: DC | PRN

## 2015-01-11 MED ORDER — HEPARIN SODIUM (PORCINE) 1000 UNIT/ML IJ SOLN
INTRAMUSCULAR | Status: DC | PRN
  Administered 2015-01-11: 12000 [IU] via INTRAVENOUS

## 2015-01-11 MED ORDER — HYDROCODONE-ACETAMINOPHEN 5-325 MG PO TABS
ORAL_TABLET | ORAL | Status: AC
  Filled 2015-01-11: qty 2

## 2015-01-11 MED ORDER — HEPARIN SODIUM (PORCINE) 1000 UNIT/ML IJ SOLN
INTRAMUSCULAR | Status: AC
  Filled 2015-01-11: qty 1

## 2015-01-11 MED ORDER — HYDRALAZINE HCL 20 MG/ML IJ SOLN
INTRAMUSCULAR | Status: AC
  Filled 2015-01-11: qty 1

## 2015-01-11 MED ORDER — ACETAMINOPHEN 325 MG PO TABS: 650.0000 mg | ORAL_TABLET | ORAL | Status: DC | PRN

## 2015-01-11 MED ORDER — ARTIFICIAL TEARS OP OINT
TOPICAL_OINTMENT | OPHTHALMIC | Status: DC | PRN
  Administered 2015-01-11: 1 via OPHTHALMIC

## 2015-01-11 MED ORDER — SODIUM CHLORIDE 0.9 % IJ SOLN
3.0000 mL | Freq: Two times a day (BID) | INTRAMUSCULAR | Status: DC
  Administered 2015-01-11: 3 mL via INTRAVENOUS

## 2015-01-11 MED ORDER — CLORAZEPATE DIPOTASSIUM 3.75 MG PO TABS
7.5000 mg | ORAL_TABLET | Freq: Every day | ORAL | Status: DC
  Administered 2015-01-11: 7.5 mg via ORAL
  Filled 2015-01-11: qty 2

## 2015-01-11 MED ORDER — HYDROCODONE-ACETAMINOPHEN 5-325 MG PO TABS
1.0000 | ORAL_TABLET | ORAL | Status: DC | PRN
  Administered 2015-01-11: 2 via ORAL

## 2015-01-11 MED ORDER — NEOSTIGMINE METHYLSULFATE 10 MG/10ML IV SOLN
INTRAVENOUS | Status: DC | PRN
  Administered 2015-01-11: 4 mg via INTRAVENOUS
  Administered 2015-01-11: 1 mg via INTRAVENOUS

## 2015-01-11 MED ORDER — SODIUM CHLORIDE 0.9 % IJ SOLN: 3.0000 mL | INTRAMUSCULAR | Status: DC | PRN

## 2015-01-11 MED ORDER — SODIUM CHLORIDE 0.9 % IV SOLN
INTRAVENOUS | Status: DC
  Administered 2015-01-11: 06:00:00 via INTRAVENOUS

## 2015-01-11 MED ORDER — LOSARTAN POTASSIUM 50 MG PO TABS
50.0000 mg | ORAL_TABLET | Freq: Every day | ORAL | Status: DC
Start: 2015-01-12 — End: 2015-01-12
  Filled 2015-01-11: qty 1

## 2015-01-11 MED ORDER — SODIUM CHLORIDE 0.9 % IV SOLN: 250.0000 mL | INTRAVENOUS | Status: DC | PRN

## 2015-01-11 MED ORDER — SODIUM CHLORIDE 0.9 % IV SOLN
2.0000 ug/min | INTRAVENOUS | Status: AC
  Administered 2015-01-11: 20 ug/min via INTRAVENOUS
  Filled 2015-01-11: qty 2

## 2015-01-11 MED ORDER — ONDANSETRON HCL 4 MG/2ML IJ SOLN
INTRAMUSCULAR | Status: DC | PRN
  Administered 2015-01-11: 4 mg via INTRAVENOUS

## 2015-01-11 MED ORDER — SUGAMMADEX SODIUM 200 MG/2ML IV SOLN
200.0000 mg | Freq: Once | INTRAVENOUS | Status: AC
  Administered 2015-01-11: 200 mg via INTRAVENOUS
  Filled 2015-01-11: qty 2

## 2015-01-11 MED ORDER — LACTATED RINGERS IV SOLN: INTRAVENOUS | Status: DC | PRN

## 2015-01-11 MED ORDER — SODIUM CHLORIDE 0.9 % IV SOLN
INTRAVENOUS | Status: DC | PRN
  Administered 2015-01-11 (×2): via INTRAVENOUS

## 2015-01-11 MED ORDER — IOHEXOL 350 MG/ML SOLN
INTRAVENOUS | Status: DC | PRN
  Administered 2015-01-11: 5 mL via INTRACARDIAC

## 2015-01-11 MED ORDER — TRAMADOL HCL 50 MG PO TABS: 50.0000 mg | ORAL_TABLET | Freq: Three times a day (TID) | ORAL | Status: DC | PRN

## 2015-01-11 MED ORDER — LIDOCAINE HCL (CARDIAC) 20 MG/ML IV SOLN
INTRAVENOUS | Status: DC | PRN
  Administered 2015-01-11: 40 mg via INTRAVENOUS

## 2015-01-11 MED ORDER — DOFETILIDE 500 MCG PO CAPS: 500.0000 ug | ORAL_CAPSULE | Freq: Two times a day (BID) | ORAL | Status: DC

## 2015-01-11 MED ORDER — DEXAMETHASONE SODIUM PHOSPHATE 4 MG/ML IJ SOLN
INTRAMUSCULAR | Status: DC | PRN
  Administered 2015-01-11: 8 mg via INTRAVENOUS

## 2015-01-11 MED ORDER — METOPROLOL SUCCINATE ER 25 MG PO TB24
25.0000 mg | ORAL_TABLET | Freq: Two times a day (BID) | ORAL | Status: DC
  Administered 2015-01-11: 25 mg via ORAL
  Filled 2015-01-11 (×3): qty 1

## 2015-01-11 MED ORDER — MIDAZOLAM HCL 5 MG/5ML IJ SOLN
INTRAMUSCULAR | Status: DC | PRN
  Administered 2015-01-11 (×2): 1 mg via INTRAVENOUS

## 2015-01-11 MED ORDER — ALBUTEROL SULFATE (2.5 MG/3ML) 0.083% IN NEBU: 2.5000 mg | INHALATION_SOLUTION | Freq: Four times a day (QID) | RESPIRATORY_TRACT | Status: DC | PRN

## 2015-01-11 MED ORDER — DOFETILIDE 500 MCG PO CAPS
500.0000 ug | ORAL_CAPSULE | Freq: Two times a day (BID) | ORAL | Status: DC
  Administered 2015-01-11 – 2015-01-12 (×2): 500 ug via ORAL
  Filled 2015-01-11 (×4): qty 1

## 2015-01-11 MED ORDER — BUPIVACAINE HCL (PF) 0.25 % IJ SOLN
INTRAMUSCULAR | Status: DC | PRN
  Administered 2015-01-11: 5 mL

## 2015-01-11 MED ORDER — FENTANYL CITRATE (PF) 100 MCG/2ML IJ SOLN
INTRAMUSCULAR | Status: DC | PRN
  Administered 2015-01-11 (×2): 50 ug via INTRAVENOUS
  Administered 2015-01-11: 100 ug via INTRAVENOUS
  Administered 2015-01-11: 50 ug via INTRAVENOUS

## 2015-01-11 MED ORDER — APIXABAN 5 MG PO TABS
5.0000 mg | ORAL_TABLET | Freq: Two times a day (BID) | ORAL | Status: DC
  Administered 2015-01-11: 5 mg via ORAL
  Filled 2015-01-11 (×3): qty 1

## 2015-01-11 SURGICAL SUPPLY — 17 items
BAG SNAP BAND KOVER 36X36 (MISCELLANEOUS) ×3 IMPLANT
CATH NAVISTAR SMARTTOUCH DF (ABLATOR) ×3 IMPLANT
CATH SOUNDSTAR 3D IMAGING (CATHETERS) ×3 IMPLANT
CATH VARIABLE LASSO NAV 2515 (CATHETERS) ×3 IMPLANT
CATH WEBSTER BI DIR CS D-F CRV (CATHETERS) ×3 IMPLANT
COVER SWIFTLINK CONNECTOR (BAG) ×3 IMPLANT
NEEDLE TRANSEP BRK 71CM 407200 (NEEDLE) ×3 IMPLANT
PACK EP LATEX FREE (CUSTOM PROCEDURE TRAY) ×3
PACK EP LF (CUSTOM PROCEDURE TRAY) ×1 IMPLANT
PAD DEFIB LIFELINK (PAD) ×3 IMPLANT
PATCH CARTO3 (PAD) ×3 IMPLANT
SHEATH AVANTI 11F 11CM (SHEATH) ×3 IMPLANT
SHEATH PINNACLE 7F 10CM (SHEATH) ×6 IMPLANT
SHEATH PINNACLE 9F 10CM (SHEATH) ×3 IMPLANT
SHEATH SWARTZ TS SL2 63CM 8.5F (SHEATH) ×3 IMPLANT
SHIELD RADPAD SCOOP 12X17 (MISCELLANEOUS) ×3 IMPLANT
TUBING SMART ABLATE COOLFLOW (TUBING) ×3 IMPLANT

## 2015-01-11 NOTE — Anesthesia Preprocedure Evaluation (Signed)
Anesthesia Evaluation  Patient identified by MRN, date of birth, ID band  Reviewed: Allergy & Precautions, H&P , NPO status , Patient's Chart, lab work & pertinent test results, reviewed documented beta blocker date and time   History of Anesthesia Complications (+) PONV  Airway Mallampati: III  TM Distance: >3 FB Neck ROM: Full    Dental no notable dental hx. (+) Teeth Intact, Dental Advisory Given   Pulmonary asthma , sleep apnea and Continuous Positive Airway Pressure Ventilation , former smoker,  breath sounds clear to auscultation  Pulmonary exam normal       Cardiovascular hypertension, Pt. on medications and Pt. on home beta blockers +CHF Rhythm:Regular Rate:Normal     Neuro/Psych Anxiety Depression negative neurological ROS     GI/Hepatic negative GI ROS, Neg liver ROS, GERD-  Medicated and Controlled,  Endo/Other  negative endocrine ROS  Renal/GU negative Renal ROS  negative genitourinary   Musculoskeletal   Abdominal   Peds  Hematology negative hematology ROS (+)   Anesthesia Other Findings   Reproductive/Obstetrics negative OB ROS                             Anesthesia Physical Anesthesia Plan  ASA: III  Anesthesia Plan: General   Post-op Pain Management:    Induction: Intravenous  Airway Management Planned: LMA  Additional Equipment:   Intra-op Plan:   Post-operative Plan: Extubation in OR  Informed Consent: I have reviewed the patients History and Physical, chart, labs and discussed the procedure including the risks, benefits and alternatives for the proposed anesthesia with the patient or authorized representative who has indicated his/her understanding and acceptance.   Dental advisory given  Plan Discussed with: CRNA  Anesthesia Plan Comments:         Anesthesia Quick Evaluation

## 2015-01-11 NOTE — Anesthesia Postprocedure Evaluation (Signed)
  Anesthesia Post-op Note  Patient: Harold Scott  Procedure(s) Performed: Procedure(s): Atrial Fibrillation Ablation (N/A)  Patient Location: PACU  Anesthesia Type:General  Level of Consciousness: awake and alert   Airway and Oxygen Therapy: Patient Spontanous Breathing  Post-op Pain: none  Post-op Assessment: Post-op Vital signs reviewed, Patient's Cardiovascular Status Stable and Respiratory Function Stable  Post-op Vital Signs: Reviewed  Filed Vitals:   01/11/15 1110  BP: 126/58  Pulse: 63  Temp:   Resp: 19    Complications: No apparent anesthesia complications

## 2015-01-11 NOTE — Interval H&P Note (Signed)
History and Physical Interval Note:  01/11/2015 7:09 AM  Harold Scott  has presented today for surgery, with the diagnosis of AFIB  The various methods of treatment have been discussed with the patient and family. After consideration of risks, benefits and other options for treatment, the patient has consented to  Procedure(s): Atrial Fibrillation Ablation (N/A) as a surgical intervention .  The patient's history has been reviewed, patient examined, no change in status, stable for surgery.  I have reviewed the patient's chart and labs.  Questions were answered to the patient's satisfaction.     [name]

## 2015-01-11 NOTE — Anesthesia Procedure Notes (Addendum)
Procedure Name: Intubation Date/Time: 01/11/2015 7:46 AM Performed by: Susa Loffler Pre-anesthesia Checklist: Patient identified, Timeout performed, Emergency Drugs available, Suction available and Patient being monitored Patient Re-evaluated:Patient Re-evaluated prior to inductionOxygen Delivery Method: Circle system utilized Preoxygenation: Pre-oxygenation with 100% oxygen Intubation Type: IV induction Ventilation: Two handed mask ventilation required and Oral airway inserted - appropriate to patient size Laryngoscope Size: McGraph, 3 and Mac (McGrath #3) Grade View: Grade I Tube type: Oral Tube size: 7.5 mm Number of attempts: 1 Airway Equipment and Method: Stylet,  Oral airway and Video-laryngoscopy Placement Confirmation: ETT inserted through vocal cords under direct vision,  positive ETCO2 and breath sounds checked- equal and bilateral Secured at: 22 cm Tube secured with: Tape Dental Injury: Teeth and Oropharynx as per pre-operative assessment  Difficulty Due To: Difficulty was anticipated

## 2015-01-11 NOTE — Transfer of Care (Signed)
Immediate Anesthesia Transfer of Care Note  Patient: Harold Scott  Procedure(s) Performed: Procedure(s): Atrial Fibrillation Ablation (N/A)  Patient Location: Cath Lab  Anesthesia Type:General  Level of Consciousness: awake, alert  and oriented  Airway & Oxygen Therapy: Patient Spontanous Breathing and Patient connected to face mask oxygen  Post-op Assessment: Report given to RN and Post -op Vital signs reviewed and stable  Post vital signs: Reviewed and stable  Last Vitals:  Filed Vitals:   01/11/15 0535  BP: 139/83  Pulse: 55  Temp: 36.8 C  Resp: 18    Complications: No apparent anesthesia complications

## 2015-01-11 NOTE — Progress Notes (Signed)
O2 Lares turned down to 2L then off. O2 sat 95% room air

## 2015-01-11 NOTE — CV Procedure (Signed)
Post sheath removal note  7Fr, 9Fr, and 11Fr. Right Femoral Venous sheaths removed by Lurene Shadow RT  Vital Signs remain stable pre and post. Post BP 121/73  Sinus Rhythm 67   Manual pressure held for 31min  Tegaderm placed.  Site is Level 0 currently. Post instructions given and patient verbalized understanding.

## 2015-01-11 NOTE — Discharge Summary (Signed)
ELECTROPHYSIOLOGY PROCEDURE DISCHARGE SUMMARY    Patient ID: Harold Scott,  MRN: [id number], DOB/AGE: 09/06/1960 54 y.o.  Admit date: 01/11/2015 Discharge date: 01/12/2015  Primary Care Physician: [name], MD Electrophysiologist: [name], MD  Primary Discharge Diagnosis:  Persistent atrial fibrillation status post ablation this admission  Secondary Discharge Diagnosis:  1.  Sleep apnea - on CPAP 2.  Obesity 3.  Depression 4.  Asthma  Procedures This Admission:  1.  Electrophysiology study and radiofrequency catheter ablation on 01/11/15 by Dr [name].  This study demonstrated sinus rhythm upon presentation; rotational Angiography reveals a large sized left atrium with four separate pulmonary veins without evidence of pulmonary vein stenosis; return of electrical activity within the right superior and right inferior pulmonary veins at baseline. The left sided PVs were quiescent from the prior procedure. The patient underwent successful sequential electrical isolation and anatomical encircling of the right superior and inferior pulmonary veins using radiofrequency current with a circular mapping catheter as a guide using a WACA approach; atrial fibrillation induced on isuprel. Cardioverted; no early apparent complications..    Brief HPI: Harold Scott is a 54 y.o. male with a history of persistent atrial fibrillation.  [name], benefits, [name]    Hospital Course:  The patient was admitted and underwent EPS/RFCA of atrial fibrillation with details as outlined above.  They were monitored on telemetry overnight which demonstrated sinus rhythm.   Groin was without complication on the day of discharge.  The patient was examined and considered to be stable for discharge.  Wound care and restrictions were reviewed with the patient.  The patient will be seen back by Roderic Palau, NP in 4 weeks and Dr [name] in 12 weeks for post ablation follow up.   [name], HTN, DM, [name], [name], [name], [name]   Physical Exam: Filed Vitals:   01/11/15 1930 01/11/15 2135 01/12/15 0400 01/12/15 0548  BP: 139/66 123/57  148/85  Pulse: 79 72 60 67  Temp: 97.7 F (36.5 C)   97.7 F (36.5 C)  TempSrc: Oral   Oral  Resp: 22 26  22   Height:      Weight:      SpO2: 96% 96% 98% 96%    GEN- The patient is obese appearing, alert and oriented x 3 today.   HEENT: normocephalic, atraumatic; sclera clear, conjunctiva pink; hearing intact; oropharynx clear; neck supple, no JVP Lymph- no cervical lymphadenopathy Lungs- Clear to ausculation bilaterally, normal work of breathing.  No wheezes, rales, rhonchi Heart- Regular rate and rhythm, no murmurs, rubs or gallops  GI- soft, non-tender, non-distended, bowel sounds present, no hepatosplenomegaly Extremities- no clubbing, cyanosis, or edema; DP/PT/radial pulses 2+ bilaterally, groin without hematoma/bruit MS- no significant deformity or atrophy Skin- warm and dry, no rash or lesion Psych- euthymic mood, full affect Neuro- strength and sensation are intact   Labs:   Lab Results  Component Value  Date   WBC 8.5 01/11/2015   HGB 12.6* 01/11/2015   HCT 37.5* 01/11/2015   MCV 90.6 01/11/2015   PLT 201 01/11/2015     Recent Labs Lab 01/12/15 0539  NA 133*  K 4.5  CL 96*  CO2 28  BUN 7  CREATININE 0.85  CALCIUM 8.8*   GLUCOSE 154*     Discharge Medications:    Medication List    TAKE these medications        albuterol 108 (90 BASE) MCG/ACT inhaler  Commonly known as:  PROVENTIL HFA;VENTOLIN HFA  Inhale 1-2 puffs into the lungs every 6 (six) hours as needed for wheezing or shortness of breath.     cholestyramine 4 G packet  Commonly known as:  QUESTRAN  TAKE 2 PACKETS DAILY     citalopram 10 MG tablet  Commonly known as:  CELEXA  TAKE 1 TABLET BY MOUTH ONCE DAILY     clorazepate 7.5 MG tablet  Commonly known as:  TRANXENE  Take 1 tablet (7.5 mg total) by mouth at bedtime.     dofetilide 500 MCG capsule  Commonly known as:  TIKOSYN  Take 1 capsule (500 mcg total) by mouth 2 (two) times daily.     ELIQUIS 5 MG Tabs tablet  Generic drug:  apixaban  TAKE 1 TABLET TWICE A DAY     esomeprazole 40 MG capsule  Commonly known as:  NEXIUM  Take 40 mg by mouth daily at 12 noon.     furosemide 40 MG tablet  Commonly known as:  LASIX  Take 1 tablet (40 mg total) by mouth daily.     guaiFENesin 200 MG tablet  Take 200 mg by mouth 2 (two) times daily as needed (for cold).     losartan 50 MG tablet  Commonly known as:  COZAAR  TAKE 1 TABLET (50 MG TOTAL) BY MOUTH DAILY.     metoprolol succinate 25 MG 24 hr tablet  Commonly known as:  TOPROL-XL  Take 1 tablet (25 mg total) by mouth 2 (two) times daily.     potassium chloride SA 20 MEQ tablet  Commonly known as:  K-DUR,KLOR-CON  Take 1 tablet (20 mEq total) by mouth daily.     simvastatin 20 MG tablet  Commonly known as:  ZOCOR  Take 1 tablet (20 mg total) by mouth daily at 6 PM.     traMADol 50 MG tablet  Commonly known as:  ULTRAM  Take 1 tablet (50 mg total) by mouth every 8 (eight) hours as needed for moderate pain.        Disposition:   Follow-up Information    Follow up with [facility] On 02/08/2015.   Why:  at 9:30AM   Contact information:   [place] [phone]       Follow up with [name], MD On 04/13/2015.   Specialty:  Cardiology   Why:  at 10:45AM   Contact information:   [place] [address] [phone]       Duration of Discharge Encounter: Greater than 30 minutes including physician time.  Signed, Chanetta Marshall, NP 01/12/2015 7:07 AM    [name] MD, [facility] 01/13/2015 10:14 PM

## 2015-01-11 NOTE — Discharge Instructions (Signed)
No driving for 5 days. No lifting over 5 lbs for 1 week. No sexual activity for 1 week. You may return to work in 1 week. Keep procedure site clean & dry. If you notice increased pain, swelling, bleeding or pus, call/return!  You may shower, but no soaking baths/hot tubs/pools for 1 week.  ° ° °

## 2015-01-11 NOTE — Op Note (Signed)
SURGEON:  [name], MD  PREPROCEDURE DIAGNOSES: 1. Paroxysmal atrial fibrillation.  POSTPROCEDURE DIAGNOSES: 1. Paroxysmal  atrial fibrillation.  PROCEDURES: 1. Comprehensive electrophysiologic study. 2. Coronary sinus pacing and recording. 3. Three-dimensional mapping of atrial fibrillation  4. Ablation of atrial fibrillation  5. Intracardiac echocardiography. 6. Transseptal puncture of an intact septum. 7. Rotational Angiography with processing at an independent workstation 8. Arrhythmia induction with pacing with isuprel infusion 9. Cardioversion  INTRODUCTION:  Harold Scott is a 54 y.o. male with a history of paroxysmal atrial fibrillation who now presents for EP study and radiofrequency ablation.  The patient reports initially being diagnosed with atrial fibrillation after presenting with symptomatic palpitations and fatgiue. The patient reports increasing frequency and duration of atrial fibrillation since that time.  The patient has failed medical therapy with Phyllis Ginger.  He underwent prior afib ablation but continues to have atrial fibrillation.  The patient therefore presents today for repeat catheter ablation of atrial fibrillation.  DESCRIPTION OF PROCEDURE:  Informed written consent was obtained, and the patient was brought to the electrophysiology lab in a fasting state.  The patient was adequately sedated with intravenous medications as outlined in the anesthesia report.  The patient's left and right groins were prepped and draped in the usual sterile fashion by the EP lab staff.  Using a percutaneous Seldinger technique, two 7-French and one 11-French hemostasis sheaths were placed into the right common femoral vein.  3 Dimensional Rotational Angiography: A 5 french pigtail catheter was introduced through the right common femoral vein and advanced into the inferior venocava.  3 demential rotational angiography was then performed by power injection of 100cc of nonionic  contrast.  Reprocessing at an independent work station was then performed.   This demonstrated a large sized left atrium with 4 separate pulmonary veins which were also moderate in size.  There were no anomalous veins or significant abnormalities.  A 3 dimensional rendering of the left atrium was then merged using [employer] onto the [employer] system and registered with intracardiac echo (see below).  The pigtail catheter was then removed.  Catheter Placement:  A 7-French Biosense Webster Decapolar coronary sinus catheter was introduced through the right common femoral vein and advanced into the coronary sinus for recording and pacing from this location.  A 6-French quadripolar Josephson catheter was introduced through the right common femoral vein and advanced into the right ventricle for recording and pacing.  This catheter was then pulled back to the His bundle location.    Initial Measurements: The patient presented to the electrophysiology lab in sinus rhythm.  His PR interval measured 160 msec with a QRS duration of 121 msec and a QT interval of 493 msec.  The AH interval measured 62 msec and the HV interval measured 45 msec.     Intracardiac Echocardiography: A 10-French Biosense Webster AcuNav intracardiac echocardiography catheter was introduced through the left common femoral vein and advanced into the right atrium. Intracardiac echocardiography was performed of the left atrium, and a three-dimensional anatomical rendering of the left atrium was performed using CARTO sound technology.  The patient was noted to have a large sized left atrium.  The interatrial septum was prominent but not aneurysmal. All 4 pulmonary veins were visualized and noted to have separate ostia.  The pulmonary veins were moderate in size.  The left atrial appendage was visualized and did not reveal thrombus.   There was no evidence of pulmonary vein stenosis.   Transseptal Puncture: The middle right  common femoral vein sheath was exchanged for an 8.5 [place] SL2 transseptal sheath and transseptal access was achieved in a standard fashion using a Brockenbrough needle under biplane fluoroscopy with intracardiac echocardiography confirmation of the transseptal puncture.  Once transseptal access had been achieved, heparin was administered intravenously and intra- arterially in order to maintain an ACT of greater than 300 seconds throughout the procedure.   3D Mapping and Ablation: The His bundle catheter was removed and in its place a 3.5 mm [employer] Thermocool ablation catheter was advanced into the right atrium.  The transseptal sheath was pulled back into the IVC over a guidewire.  The ablation catheter was advanced across the transseptal hole using the wire as a guide.  The transseptal sheath was then re-advanced over the guidewire into the left atrium.  A duodecapolar Biosense Webster circular mapping catheter was introduced through the transseptal sheath and positioned over the mouth of all 4 pulmonary veins.  Three-dimensional electroanatomical mapping was performed using CARTO technology.  This demonstrated electrical activity within the right superior and right inferior pulmonary veins at baseline.  The left sided PVs were quiescent from the prior procedure. The patient underwent successful sequential electrical isolation and anatomical encircling of the right superior and inferior pulmonary veins using radiofrequency current with a circular mapping catheter as a guide using a WACA approach.   Measurements Following Ablation: Following ablation, Isuprel was infused up to 20 mcg/min with no inducible atrial fibrillation, atrial tachycardia, atrial flutter, or sustained PACs. I  Ventricular pacing was performed, which revealed midline decremental VA conduction with a VA Wenckebach cycle length of 594msec.  Rapid atrial pacing was performed, which revealed an AV Wenckebach cycle  length of 380 msec.  Electroisolation was then again confirmed in all four pulmonary veins.  Pacing was performed along the ablation line which confirmed entrance and exit block.  With rapid atrial pacing at 300 msec with isuprel, the patient developed left atrial flutter which quickly degenerated into atrial fibrillation.     Cardioversion: The patient was then cardioverted to sinus rhythm with a single synchronized 360-J biphasic shock with cardioversion electrodes in the anterior-posterior thoracic configuration. He maintained sinus rhythm initially but then had recurence of afib with catheter manipulation within the left atrial, requiring repeat cardioversion with 360J biphasic.  He returned to afib.  A 3rd shock was also unsuccessful.  The procedure was therefore considered completed.  All catheters were removed, and the sheaths were aspirated and flushed.  The patient was transferred to the recovery area for sheath removal per protocol. EBL<25ml.  A limited bedside transthoracic echocardiogram revealed no pericardial effusion.  There were no early apparent complications.  CONCLUSIONS: 1. Sinus rhythm upon presentation.   2. Rotational Angiography reveals a large sized left atrium with four separate pulmonary veins without evidence of pulmonary vein stenosis. 3. Return of electrical activity within the right superior and right inferior pulmonary veins at baseline.  The left sided PVs were quiescent from the prior procedure. The patient underwent successful sequential electrical isolation and anatomical encircling of the right superior and inferior pulmonary veins using radiofrequency current with a circular mapping catheter as a guide using a WACA approach.  4. Atrial fibrillation induced on isuprel.  Cardioverted 5. No early apparent complications.   Dabney Schanz,MD 10:15 AM 01/11/2015

## 2015-01-11 NOTE — H&P (View-Only) (Signed)
Electrophysiology Office Note   Date:  12/27/2014   ID:  Harold Scott, DOB Aug 06, 1961, MRN [id number]  PCP:  [name], MD   Primary Electrophysiologist: Rameses Ou   Chief Complaint  Patient presents with  . PAF     History of Present Illness: Harold Scott is a 54 y.o. male who presents today for electrophysiology evaluation.   His afib continues to increase in both frequency and duration despite recent ablation.  He finds this disturbing with symptoms of SOB, palpitations, and fatigue.Marland Kitchen  He is unaware of triggers/ precipitants.  He reports compliance with tikosyn.  He has had very little success with weight recuction.  He is compliant with CPAP.   Today, he denies symptoms of  chest pain, orthopnea, PND, lower extremity edema, claudication, dizziness, presyncope, syncope, bleeding, or neurologic sequela. The patient is tolerating medications without difficulties and is otherwise without complaint today.    Past Medical History  Diagnosis Date  . Anxiety   . Depression   . GERD (gastroesophageal reflux disease)   . Gallstones   . Asthma   . PONV (postoperative nausea and vomiting)   . Paroxysmal atrial fibrillation     a.  previously intolerant of Flecainide and Multaq b. PVI by Dr [name] 09/21/14  . Obstructive sleep apnea     a. on CPAP, followed by [facility]   Past Surgical History  Procedure Laterality Date  . Hand surgery    . Tonsillectomy    . Wisdom tooth extraction    . Cholecystectomy  08/05/2012    Procedure: LAPAROSCOPIC CHOLECYSTECTOMY WITH INTRAOPERATIVE CHOLANGIOGRAM;  Surgeon: [name], MD,FACS;  Location: [facility] [facility];  Service: General;  Laterality: N/A;  . Laparoscopic appendectomy N/A 02/13/2014    Procedure: APPENDECTOMY LAPAROSCOPIC;  Surgeon: [name], MD;  Location: [facility];  Service: General;  Laterality: N/A;  . Tee without cardioversion N/A 09/20/2014    Procedure: TRANSESOPHAGEAL ECHOCARDIOGRAM (TEE);  Surgeon: [name], MD;   Location: [place];  Service: Cardiovascular;  Laterality: N/A;  . Ablation  09/21/14    PVI by Dr [name]  . Atrial fibrillation ablation N/A 09/21/2014    Procedure: ATRIAL FIBRILLATION ABLATION;  Surgeon: [name], MD;  Location: [facility] CATH [facility];  Service: Cardiovascular;  Laterality: N/A;     Current Outpatient Prescriptions  Medication Sig Dispense Refill  . albuterol (PROVENTIL HFA;VENTOLIN HFA) 108 (90 BASE) MCG/ACT inhaler Inhale 1-2 puffs into the lungs every 6 (six) hours as needed for wheezing or shortness of breath.    . cholestyramine (QUESTRAN) 4 G packet TAKE 2 PACKETS DAILY (Patient taking differently: TAKE 2 PACKETS BY MOUTH DAILY) 60 packet 10  . citalopram (CELEXA) 10 MG tablet TAKE 1 TABLET BY MOUTH ONCE DAILY 90 tablet 1  . clorazepate (TRANXENE) 7.5 MG tablet Take 1 tablet (7.5 mg total) by mouth at bedtime. 30 tablet 5  . dofetilide (TIKOSYN) 500 MCG capsule Take 1 capsule (500 mcg total) by mouth 2 (two) times daily. 14 capsule 0  . ELIQUIS 5 MG TABS tablet TAKE 1 TABLET TWICE A DAY (Patient taking differently: TAKE 1 TABLET BY MOUTH TWICE A DAY) 60 tablet 3  . esomeprazole (NEXIUM) 40 MG capsule Take 40 mg by mouth daily at 12 noon.    . furosemide (LASIX) 40 MG tablet Take 1 tablet (40 mg total) by mouth daily. 30 tablet 1  . guaiFENesin 200 MG tablet Take 200 mg by mouth 2 (two) times daily as needed (for  cold).    . losartan (COZAAR) 50 MG tablet TAKE 1 TABLET (50 MG TOTAL) BY MOUTH DAILY. 90 tablet 0  . metoprolol succinate (TOPROL-XL) 25 MG 24 hr tablet Take 1 tablet (25 mg total) by mouth 2 (two) times daily. 60 tablet 1  . potassium chloride SA (K-DUR,KLOR-CON) 20 MEQ tablet Take 1 tablet (20 mEq total) by mouth daily. 30 tablet 1  . simvastatin (ZOCOR) 20 MG tablet Take 1 tablet (20 mg total) by mouth daily at 6 PM. 30 tablet 11  . traMADol (ULTRAM) 50 MG tablet Take 1 tablet (50 mg total) by mouth every 8 (eight) hours as needed for moderate pain. 40 tablet 0     No current facility-administered medications for this visit.    Allergies:   Flecainide; Multaq; and Penicillins   Social History:  The patient  reports that he quit smoking about 8 years ago. His smoking use included Cigarettes. He has a 15 pack-year smoking history. He has never used smokeless tobacco. He reports that he drinks about 4.2 - 8.4 oz of alcohol per week. He reports that he does not use illicit drugs.   Family History:  Adopted and does not know FH    ROS:  Please see the history of present illness.     All other systems are reviewed and negative.    PHYSICAL EXAM: VS:  BP 102/86 mmHg  Pulse 99  Ht 5\' 9"  (1.753 m)  Wt 280 lb (127.007 kg)  BMI 41.33 kg/m2 , BMI Body mass index is 41.33 kg/(m^2). GEN: overweight, in no acute distress HEENT: normal Neck: no JVD, carotid bruits, or masses Cardiac: iRRR; no murmurs, rubs, or gallops,no edema  Respiratory:  clear to auscultation bilaterally, normal work of breathing GI: soft, nontender, nondistended, + BS MS: no deformity or atrophy Skin: warm and dry,  Neuro:  Strength and sensation are intact Psych: euthymic mood, full affect  EKG:  EKG is ordered today. The ekg ordered today shows afib   Recent Labs: 07/18/2014: Pro B Natriuretic peptide (BNP) 1325.0* 07/20/2014: TSH 5.500* 12/21/2014: ALT 69*; B Natriuretic Peptide 318.2* 12/22/2014: Magnesium 1.8 12/27/2014: BUN 16; Creatinine 1.01; Hemoglobin 15.2; Platelets 328.0; Potassium 4.8; Sodium 134*    Lipid Panel     Component Value Date/Time   CHOL 228* 07/19/2014 0425   TRIG 338* 07/19/2014 0425   HDL 30* 07/19/2014 0425   CHOLHDL 7.6 07/19/2014 0425   VLDL 68* 07/19/2014 0425   LDLCALC 130* 07/19/2014 0425     Wt Readings from Last 3 Encounters:  12/27/14 280 lb (127.007 kg)  12/23/14 279 lb 8 oz (126.78 kg)  09/28/14 280 lb (127.007 kg)     ASSESSMENT AND PLAN:  1.  Paroxysmal atrial fibrillation Unfortunately his afib continues to worsen despite  ablation.  He does not tolerate this well.  He has failed medical therapy. Therapeutic strategies for afib including medicine and repeat ablation were discussed in detail with the patient today. Risk, benefits, and alternatives to EP study and radiofrequency ablation for afib were also discussed in detail today. These risks include but are not limited to stroke, bleeding, vascular damage, tamponade, perforation, damage to the esophagus, lungs, and other structures, pulmonary vein stenosis, worsening renal function, and death. The patient understands these risk and wishes to proceed.  We will therefore proceed with repeat catheter ablation at the next available time.  He will have a TEE prior to the procedure to exclude LA thrombus. chads2vasc is 0.  He is  on eliquis and will need to remain on this 3 months post ablation.  If he fails repeat ablation then our options would be amiodarone or epicardial maze/ hybrid ablation.  2. OSA Compliance with CPAP is encouraged  3. Overweight. I have reviewed the patients BMI and decreased success rates with ablation at length today.  Weight loss is strongly advised.  Systems developer and ARREST AF study results were discussed at length and the importance of lifestyle changes was stressed. Per Guijian et al (PACE 2013; 36: 737-366), patients with BMI 25-29.9 (obese) have a 27% increase in AF recurrence post ablation.  Patients with BMI >30 have a 31% increase in AF recurrence post ablation when compared to those with BMI <25.   Current medicines are reviewed at length with the patient today.   The patient is does not have concerns regarding his medicines.      Army Fossa, MD  12/27/2014 5:33 PM      [address] [place] [address] [phone] (office) [phone] (fax)

## 2015-01-12 DIAGNOSIS — I481 Persistent atrial fibrillation: Secondary | ICD-10-CM

## 2015-01-12 LAB — BASIC METABOLIC PANEL
ANION GAP: 9 (ref 5–15)
BUN: 7 mg/dL (ref 6–20)
CALCIUM: 8.8 mg/dL — AB (ref 8.9–10.3)
CHLORIDE: 96 mmol/L — AB (ref 101–111)
CO2: 28 mmol/L (ref 22–32)
Creatinine, Ser: 0.85 mg/dL (ref 0.61–1.24)
GFR calc Af Amer: >60 mL/min (ref >60)
Glucose, Bld: 154 mg/dL — ABNORMAL HIGH (ref 65–99)
Potassium: 4.5 mmol/L (ref 3.5–5.1)
Sodium: 133 mmol/L — ABNORMAL LOW (ref 135–145)

## 2015-01-12 NOTE — Progress Notes (Signed)
Pt stable, discussed D/C instructions with him including medications.  Instructed on care of cath site in rt groin.  Pt asked appropriate questions regarding care.  IV removed, no bleeding noted.  Pt carried out to car in w/c.  Able to walk sort distance to car independently.   Carol Ada, RN

## 2015-01-20 ENCOUNTER — Other Ambulatory Visit: Payer: Self-pay | Admitting: Family Medicine

## 2015-02-08 ENCOUNTER — Ambulatory Visit (HOSPITAL_COMMUNITY)
Admission: RE | Admit: 2015-02-08 | Discharge: 2015-02-08 | Disposition: A | Discharge status: Home / Self Care | Payer: 59 | Source: Ambulatory Visit | Attending: Nurse Practitioner | Admitting: Nurse Practitioner

## 2015-02-08 ENCOUNTER — Encounter (HOSPITAL_COMMUNITY): Payer: Self-pay | Admitting: Nurse Practitioner

## 2015-02-08 VITALS — BP 138/84 | HR 62 | Ht 68.0 in | Wt 285.0 lb

## 2015-02-08 DIAGNOSIS — I481 Persistent atrial fibrillation: Secondary | ICD-10-CM

## 2015-02-08 DIAGNOSIS — E663 Overweight: Secondary | ICD-10-CM | POA: Insufficient documentation

## 2015-02-08 DIAGNOSIS — G4733 Obstructive sleep apnea (adult) (pediatric): Secondary | ICD-10-CM | POA: Diagnosis not present

## 2015-02-08 DIAGNOSIS — I4819 Other persistent atrial fibrillation: Secondary | ICD-10-CM

## 2015-02-08 LAB — BASIC METABOLIC PANEL
Anion gap: 8 (ref 5–15)
BUN: 14 mg/dL (ref 6–20)
CO2: 28 mmol/L (ref 22–32)
Calcium: 9 mg/dL (ref 8.9–10.3)
Chloride: 100 mmol/L — ABNORMAL LOW (ref 101–111)
Creatinine, Ser: 0.85 mg/dL (ref 0.61–1.24)
GFR calc Af Amer: >60 mL/min (ref >60)
GFR calc non Af Amer: >60 mL/min (ref >60)
Glucose, Bld: 129 mg/dL — ABNORMAL HIGH (ref 65–99)
POTASSIUM: 3.9 mmol/L (ref 3.5–5.1)
SODIUM: 136 mmol/L (ref 135–145)

## 2015-02-08 LAB — MAGNESIUM: Magnesium: 1.9 mg/dL (ref 1.7–2.4)

## 2015-02-08 NOTE — Progress Notes (Signed)
Patient ID: Harold Scott, male   DOB: 21-Oct-1960, 54 y.o.   MRN: [id number]         Date:  02/08/2015   ID:  Harold Scott, DOB [birth year], MRN [id number]  PCP:  [name], MD   Primary Electrophysiologist: [name]   Chief Complaint  Patient presents with  . Atrial Fibrillation     History of Present Illness: Harold Scott is a 54 y.o. male who presents today for f/u in the afib clinic.   His afib continued to increase in both frequency and duration s/p ablation 2/16.  He found this disturbing with symptoms of SOB, palpitations, and fatigue.  He returned for redo afib ablation 12/21/14 and is now seeing some difference in his afib burden. He is very pleased with the results. He is noticing less frequent episodes with milder symptoms of shorter duration. He denies any adverse effects of procedure. Continues on Tikosyn and DOAC.  He reports compliance with tikosyn.  He has had very little success with weight recuction.  He is compliant with CPAP.  He is in SR today. Today, he denies symptoms of  chest pain, orthopnea, PND, lower extremity edema, claudication, dizziness, presyncope, syncope, bleeding, or neurologic sequela. The patient is tolerating medications without difficulties and is otherwise without complaint today.    Past Medical History  Diagnosis Date  . Anxiety   . Depression   . GERD (gastroesophageal reflux disease)   . Gallstones   . Asthma   . PONV (postoperative nausea and vomiting)   . Paroxysmal atrial fibrillation     a.  previously intolerant of Flecainide and Multaq b. PVI by Dr [name] 09/21/14  . Obstructive sleep apnea     a. on CPAP, followed by [facility]   Past Surgical History  Procedure Laterality Date  . Hand surgery    . Tonsillectomy    . Wisdom tooth extraction    . Cholecystectomy  08/05/2012    Procedure: LAPAROSCOPIC CHOLECYSTECTOMY WITH INTRAOPERATIVE CHOLANGIOGRAM;  Surgeon: [name], MD,FACS;  Location: [facility];  Service: General;   Laterality: N/A;  . Laparoscopic appendectomy N/A 02/13/2014    Procedure: APPENDECTOMY LAPAROSCOPIC;  Surgeon: [name], MD;  Location: [facility];  Service: General;  Laterality: N/A;  . Tee without cardioversion N/A 09/20/2014    Procedure: TRANSESOPHAGEAL ECHOCARDIOGRAM (TEE);  Surgeon: [name], MD;  Location: [facility];  Service: Cardiovascular;  Laterality: N/A;  . Ablation  09/21/14    PVI by Dr [name]  . Atrial fibrillation ablation N/A 09/21/2014    Procedure: ATRIAL FIBRILLATION ABLATION;  Surgeon: [name], MD;  Location: [facility] CATH LAB;  Service: Cardiovascular;  Laterality: N/A;  . Tee without cardioversion N/A 01/10/2015    Procedure: TRANSESOPHAGEAL ECHOCARDIOGRAM (TEE);  Surgeon: Sanda Klein, MD;  Location: [facility] ENDOSCOPY;  Service: Cardiovascular;  Laterality: N/A;  . Electrophysiologic study N/A 01/11/2015    Procedure: Atrial Fibrillation Ablation;  Surgeon: [name], MD;  Location: [place] CV LAB;  Service: Cardiovascular;  Laterality: N/A;     Current Outpatient Prescriptions  Medication Sig Dispense Refill  . albuterol (PROVENTIL HFA;VENTOLIN HFA) 108 (90 BASE) MCG/ACT inhaler Inhale 1-2 puffs into the lungs every 6 (six) hours as needed for wheezing or shortness of breath.    . cholestyramine (QUESTRAN) 4 G packet TAKE 2 PACKETS DAILY (Patient taking differently: TAKE 2 PACKETS BY MOUTH DAILY) 60 packet 10  . citalopram (CELEXA) 10 MG tablet TAKE 1 TABLET BY MOUTH ONCE DAILY  90 tablet 1  . clorazepate (TRANXENE) 7.5 MG tablet TAKE 1 TABLET BY MOUTH AT BEDTIME 30 tablet 5  . dofetilide (TIKOSYN) 500 MCG capsule Take 1 capsule (500 mcg total) by mouth 2 (two) times daily. 14 capsule 0  . ELIQUIS 5 MG TABS tablet TAKE 1 TABLET TWICE A DAY (Patient taking differently: TAKE 1 TABLET BY MOUTH TWICE A DAY) 60 tablet 3  . esomeprazole (NEXIUM) 40 MG capsule Take 40 mg by mouth daily at 12 noon.    Marland Kitchen esomeprazole (NEXIUM) 40 MG capsule TAKE 1 CAPSULE (40 MG  TOTAL) BY MOUTH 2 (TWO) TIMES DAILY AS NEEDED. FOR ACID REFLUX 180 capsule 1  . furosemide (LASIX) 40 MG tablet Take 1 tablet (40 mg total) by mouth daily. 30 tablet 1  . guaiFENesin 200 MG tablet Take 200 mg by mouth 2 (two) times daily as needed (for cold).    Marland Kitchen losartan (COZAAR) 50 MG tablet TAKE 1 TABLET (50 MG TOTAL) BY MOUTH DAILY. 90 tablet 0  . metoprolol succinate (TOPROL-XL) 25 MG 24 hr tablet Take 1 tablet (25 mg total) by mouth 2 (two) times daily. 60 tablet 1  . potassium chloride SA (K-DUR,KLOR-CON) 20 MEQ tablet Take 1 tablet (20 mEq total) by mouth daily. 30 tablet 1  . simvastatin (ZOCOR) 20 MG tablet Take 1 tablet (20 mg total) by mouth daily at 6 PM. 30 tablet 11  . traMADol (ULTRAM) 50 MG tablet Take 1 tablet (50 mg total) by mouth every 8 (eight) hours as needed for moderate pain. 40 tablet 0   No current facility-administered medications for this encounter.    Allergies:   Flecainide; Multaq; and Penicillins   Social History:  The patient  reports that he quit smoking about 8 years ago. His smoking use included Cigarettes. He has a 15 pack-year smoking history. He has never used smokeless tobacco. He reports that he drinks about 4.2 - 8.4 oz of alcohol per week. He reports that he does not use illicit drugs.   Family History:  Adopted and does not know FH    ROS:  Please see the history of present illness.     All other systems are reviewed and negative.    PHYSICAL EXAM: VS:  BP 138/84 mmHg  Pulse 62  Ht 5\' 8"  (1.727 m)  Wt 285 lb (129.275 kg)  BMI 43.34 kg/m2 , BMI Body mass index is 43.34 kg/(m^2). GEN: overweight, in no acute distress HEENT: normal Neck: no JVD, carotid bruits, or masses Cardiac: iRRR; no murmurs, rubs, or gallops,no edema  Respiratory:  clear to auscultation bilaterally, normal work of breathing GI: soft, nontender, nondistended, + BS MS: no deformity or atrophy Skin: warm and dry,  Neuro:  Strength and sensation are intact Psych:  euthymic mood, full affect  EKG:  EKG is ordered today. The ekg ordered today shows SR at 62 bpm, LVH, inferior infarct, anterolateral infarct, age undetermined.  Pr int 124 ms, QRS 148 ms, QTc 507 ms.   Recent Labs: 07/18/2014: Pro B Natriuretic peptide (BNP) 1325.0* 07/20/2014: TSH 5.500* 12/21/2014: ALT 69*; B Natriuretic Peptide 318.2* 12/22/2014: Magnesium 1.8 01/11/2015: Hemoglobin 12.6*; Platelets 201 01/12/2015: BUN 7; Creatinine, Ser 0.85; Potassium 4.5; Sodium 133*    Lipid Panel     Component Value Date/Time   CHOL 228* 07/19/2014 0425   TRIG 338* 07/19/2014 0425   HDL 30* 07/19/2014 0425   CHOLHDL 7.6 07/19/2014 0425   VLDL 68* 07/19/2014 0425   LDLCALC 130* 07/19/2014  0425     Wt Readings from Last 3 Encounters:  02/08/15 285 lb (129.275 kg)  01/11/15 287 lb 7.7 oz (130.4 kg)  12/27/14 280 lb (127.007 kg)     ASSESSMENT AND PLAN:  1.  Persistent atrial fibrillation Much improved after second ablation Continue tikosyn/metoprolol Bmet/ mag today  2. OSA Compliance with CPAP is encouraged  3. Overweight. Continue to strive for weight loss/regular exercise program    F/u with [name] as scheduled in August, here as needed  Eduard Roux, NP  02/08/2015 4:21 PM

## 2015-02-21 ENCOUNTER — Other Ambulatory Visit: Payer: Self-pay | Admitting: Internal Medicine

## 2015-02-22 ENCOUNTER — Other Ambulatory Visit (HOSPITAL_COMMUNITY): Payer: Self-pay | Admitting: *Deleted

## 2015-02-22 MED ORDER — LOSARTAN POTASSIUM 50 MG PO TABS: 50.0000 mg | ORAL_TABLET | Freq: Every day | ORAL | Status: DC

## 2015-02-22 MED ORDER — FUROSEMIDE 40 MG PO TABS: 40.0000 mg | ORAL_TABLET | Freq: Every day | ORAL | Status: DC

## 2015-02-22 MED ORDER — POTASSIUM CHLORIDE CRYS ER 20 MEQ PO TBCR: 20.0000 meq | EXTENDED_RELEASE_TABLET | Freq: Every day | ORAL | Status: DC

## 2015-03-14 ENCOUNTER — Other Ambulatory Visit: Payer: Self-pay | Admitting: Internal Medicine

## 2015-04-04 ENCOUNTER — Telehealth (HOSPITAL_COMMUNITY): Payer: Self-pay | Admitting: *Deleted

## 2015-04-04 MED ORDER — DILTIAZEM HCL 30 MG PO TABS: ORAL_TABLET | ORAL | Status: DC

## 2015-04-04 NOTE — Telephone Encounter (Addendum)
Patient called in this morning stating he is extremely frustrated because he is having more afib again. He says its lasting 12-24 hours at a time and he is short of breath all the time (worse while in afib).  He states his HR is ranging from 80-120s but has not checked his BP. His weight has remained stable he does not feel like he is holding on to fluid. States he is trying to stay on a salt restricted diet but is not losing any weight from this and is unable to exercise due to shortness of breath.  States he has been in afib since Saturday at midnight this time but hasnt taken any extra medications -- will discuss with Roderic Palau, NP on recommendations.   Discussed with Butch Penny -- will prescribe cardizem 30mg  that he can take PRN afib hr >100. Instructed patient to call back if afib continues as he may need to come in for office visit to assess ekg and evaluate fluid status. Patient verbalized understanding and will call back if issues or he notices weight gain/swelling.

## 2015-04-05 ENCOUNTER — Telehealth (HOSPITAL_COMMUNITY): Payer: Self-pay | Admitting: *Deleted

## 2015-04-05 NOTE — Telephone Encounter (Signed)
Pt called back in today stating he woke up with HR of 150 in afib -- took the PRN cardizem and 20 mins later he had converted to NSR HR 60.  Wanted to make sure the medication was supposed to cause this so quickly.  Reassured patient that short acting cardizem is designed to work quickly but advised it can only last for short time so if needed can take again in 4 hours if needed for elevated heart rate. Patient verbalized understanding and was appreciative of advice.

## 2015-04-11 ENCOUNTER — Other Ambulatory Visit (HOSPITAL_COMMUNITY): Payer: Self-pay | Admitting: Nurse Practitioner

## 2015-04-11 ENCOUNTER — Ambulatory Visit (HOSPITAL_COMMUNITY)
Admission: RE | Admit: 2015-04-11 | Discharge: 2015-04-11 | Disposition: A | Discharge status: Home / Self Care | Payer: 59 | Source: Ambulatory Visit | Attending: Nurse Practitioner | Admitting: Nurse Practitioner

## 2015-04-11 ENCOUNTER — Encounter (HOSPITAL_COMMUNITY): Payer: Self-pay | Admitting: Nurse Practitioner

## 2015-04-11 VITALS — BP 124/78 | HR 94 | Ht 68.0 in | Wt 285.4 lb

## 2015-04-11 DIAGNOSIS — Z6841 Body Mass Index (BMI) 40.0 and over, adult: Secondary | ICD-10-CM | POA: Insufficient documentation

## 2015-04-11 DIAGNOSIS — Z880 Allergy status to penicillin: Secondary | ICD-10-CM | POA: Diagnosis not present

## 2015-04-11 DIAGNOSIS — E669 Obesity, unspecified: Secondary | ICD-10-CM | POA: Diagnosis not present

## 2015-04-11 DIAGNOSIS — Z79899 Other long term (current) drug therapy: Secondary | ICD-10-CM | POA: Insufficient documentation

## 2015-04-11 DIAGNOSIS — K219 Gastro-esophageal reflux disease without esophagitis: Secondary | ICD-10-CM | POA: Diagnosis not present

## 2015-04-11 DIAGNOSIS — I481 Persistent atrial fibrillation: Secondary | ICD-10-CM | POA: Insufficient documentation

## 2015-04-11 DIAGNOSIS — J45909 Unspecified asthma, uncomplicated: Secondary | ICD-10-CM | POA: Diagnosis not present

## 2015-04-11 DIAGNOSIS — Z87891 Personal history of nicotine dependence: Secondary | ICD-10-CM | POA: Insufficient documentation

## 2015-04-11 DIAGNOSIS — I4892 Unspecified atrial flutter: Secondary | ICD-10-CM | POA: Insufficient documentation

## 2015-04-11 DIAGNOSIS — Z7902 Long term (current) use of antithrombotics/antiplatelets: Secondary | ICD-10-CM | POA: Insufficient documentation

## 2015-04-11 DIAGNOSIS — G4733 Obstructive sleep apnea (adult) (pediatric): Secondary | ICD-10-CM | POA: Diagnosis not present

## 2015-04-11 LAB — CBC
HCT: 42.7 % (ref 39.0–52.0)
HEMOGLOBIN: 14.4 g/dL (ref 13.0–17.0)
MCH: 31.4 pg (ref 26.0–34.0)
MCHC: 33.7 g/dL (ref 30.0–36.0)
MCV: 93.2 fL (ref 78.0–100.0)
PLATELETS: 246 10*3/uL (ref 150–400)
RBC: 4.58 MIL/uL (ref 4.22–5.81)
RDW: 13.5 % (ref 11.5–15.5)
WBC: 8.2 10*3/uL (ref 4.0–10.5)

## 2015-04-11 LAB — TSH: TSH: 6.568 u[IU]/mL — ABNORMAL HIGH (ref 0.350–4.500)

## 2015-04-11 LAB — BASIC METABOLIC PANEL
ANION GAP: 7 (ref 5–15)
BUN: 10 mg/dL (ref 6–20)
CHLORIDE: 100 mmol/L — AB (ref 101–111)
CO2: 29 mmol/L (ref 22–32)
Calcium: 9.4 mg/dL (ref 8.9–10.3)
Creatinine, Ser: 0.86 mg/dL (ref 0.61–1.24)
GFR calc non Af Amer: >60 mL/min (ref >60)
Glucose, Bld: 110 mg/dL — ABNORMAL HIGH (ref 65–99)
POTASSIUM: 4.2 mmol/L (ref 3.5–5.1)
Sodium: 136 mmol/L (ref 135–145)

## 2015-04-11 LAB — MAGNESIUM: Magnesium: 2 mg/dL (ref 1.7–2.4)

## 2015-04-11 NOTE — Progress Notes (Signed)
Patient ID: Harold Scott, male   DOB: 02/21/1961, 54 y.o.   MRN: [id number]     Primary Care Physician: [name], MD Referring Physician: Dr. Minette Headland is a 54 y.o. male with a h/o persistent afib s/p ablation x2 , 09/21/14 and again 5/16 due to persistent afib after the first ablation. He had been doing well after the second ablation until last Thursday when he  noticed he was out of rhythm, which has been persistent.Marland Kitchen He is very symptomatic with fatigue and dyspnea on exertion. He was given 30 mg Cardizem to use along with his other rate control meds as well as tikosyn, which helped bring down his heart rate and but also dropped his blood pressure and made him feel worse. He has been compliant with cpap and has not missed any doses of eliquis. He did develop heart failure symptoms when required hospitalization this spring after a long sustained episode of afib.  Today, he  Is positive for  symptoms of palpitations, shortness of breath,  fatigue, no orthopnea, PND, lower extremity edema, dizziness, presyncope, syncope, or neurologic sequela. The patient is tolerating medications without difficulties and is otherwise without complaint today.   Past Medical History  Diagnosis Date  . Anxiety   . Depression   . GERD (gastroesophageal reflux disease)   . Gallstones   . Asthma   . PONV (postoperative nausea and vomiting)   . Paroxysmal atrial fibrillation     a.  previously intolerant of Flecainide and Multaq b. PVI by Dr [name] 09/21/14  . Obstructive sleep apnea     a. on CPAP, followed by [facility]   Past Surgical History  Procedure Laterality Date  . Hand surgery    . Tonsillectomy    . Wisdom tooth extraction    . Cholecystectomy  08/05/2012    Procedure: LAPAROSCOPIC CHOLECYSTECTOMY WITH INTRAOPERATIVE CHOLANGIOGRAM;  Surgeon: [name], MD,FACS;  Location: [facility];  Service: General;  Laterality: N/A;  . Laparoscopic appendectomy N/A 02/13/2014    Procedure:  APPENDECTOMY LAPAROSCOPIC;  Surgeon: [name], MD;  Location: [facility];  Service: General;  Laterality: N/A;  . Tee without cardioversion N/A 09/20/2014    Procedure: TRANSESOPHAGEAL ECHOCARDIOGRAM (TEE);  Surgeon: [name], MD;  Location: [place];  Service: Cardiovascular;  Laterality: N/A;  . Ablation  09/21/14    PVI by Dr [name]  . Atrial fibrillation ablation N/A 09/21/2014    Procedure: ATRIAL FIBRILLATION ABLATION;  Surgeon: [name], MD;  Location: [facility] CATH LAB;  Service: Cardiovascular;  Laterality: N/A;  . Tee without cardioversion N/A 01/10/2015    Procedure: TRANSESOPHAGEAL ECHOCARDIOGRAM (TEE);  Surgeon: Sanda Klein, MD;  Location: [facility] ENDOSCOPY;  Service: Cardiovascular;  Laterality: N/A;  . Electrophysiologic study N/A 01/11/2015    Procedure: Atrial Fibrillation Ablation;  Surgeon: [name], MD;  Location: [facility] CV LAB;  Service: Cardiovascular;  Laterality: N/A;    Current Outpatient Prescriptions  Medication Sig Dispense Refill  . cholestyramine (QUESTRAN) 4 G packet TAKE 2 PACKETS DAILY (Patient taking differently: TAKE 2 PACKETS BY MOUTH DAILY) 60 packet 10  . citalopram (CELEXA) 10 MG tablet TAKE 1 TABLET BY MOUTH ONCE DAILY 90 tablet 1  . clorazepate (TRANXENE) 7.5 MG tablet TAKE 1 TABLET BY MOUTH AT BEDTIME 30 tablet 5  . diltiazem (CARDIZEM) 30 MG tablet Take 1 tablet every 4 hours AS NEEDED for afib HR>100 and BP>100 30 tablet 1  . dofetilide (TIKOSYN) 500 MCG capsule  Take 1 capsule (500 mcg total) by mouth 2 (two) times daily. 14 capsule 0  . ELIQUIS 5 MG TABS tablet TAKE 1 TABLET TWICE A DAY 60 tablet 6  . esomeprazole (NEXIUM) 40 MG capsule Take 40 mg by mouth daily at 12 noon.    . furosemide (LASIX) 40 MG tablet Take 1 tablet (40 mg total) by mouth daily. 30 tablet 6  . losartan (COZAAR) 50 MG tablet Take 1 tablet (50 mg total) by mouth daily. 90 tablet 6  . metoprolol succinate (TOPROL-XL) 25 MG 24 hr tablet Take 1 tablet (25 mg total) by  mouth 2 (two) times daily. 60 tablet 1  . potassium chloride SA (K-DUR,KLOR-CON) 20 MEQ tablet Take 1 tablet (20 mEq total) by mouth daily. 30 tablet 6  . simvastatin (ZOCOR) 20 MG tablet Take 1 tablet (20 mg total) by mouth daily at 6 PM. 30 tablet 11  . guaiFENesin 200 MG tablet Take 200 mg by mouth 2 (two) times daily as needed (for cold).     No current facility-administered medications for this encounter.    Allergies  Allergen Reactions  . Flecainide Other (See Comments)    EXTREME SOB  . Multaq [Dronedarone] Shortness Of Breath  . Penicillins Hives    Social History   Social History  . Marital Status: Married    Spouse Name: N/A  . Number of Children: 1  . Years of Education: N/A   Occupational History  . Courier    Social History Main Topics  . Smoking status: Former Smoker -- 0.50 packs/day for 30 years    Types: Cigarettes    Quit date: 07/23/2006  . Smokeless tobacco: Never Used  . Alcohol Use: 4.2 - 8.4 oz/week    7-14 Standard drinks or equivalent per week     Comment: social  . Drug Use: No  . Sexual Activity: Not on file   Other Topics Concern  . Not on file   Social History Narrative   Pt works as a Secondary school teacher    Family History  Problem Relation Age of Onset  . Adopted: Yes    ROS- All systems are reviewed and negative except as per the HPI above  Physical Exam: Filed Vitals:   04/11/15 1510  BP: 124/78  Pulse: 94  Height: 5\' 8"  (1.727 m)  Weight: 285 lb 6.4 oz (129.457 kg)    GEN- The patient is well appearing, alert and oriented x 3 today.   Head- normocephalic, atraumatic Eyes-  Sclera clear, conjunctiva pink Ears- hearing intact Oropharynx- clear Neck- supple, no JVP Lymph- no cervical lymphadenopathy Lungs- Clear to ausculation bilaterally, normal work of breathing Heart- Irregular rate and rhythm, no murmurs, rubs or gallops, PMI not laterally displaced GI- soft, NT, ND, + BS Extremities- no clubbing, cyanosis, or  edema MS- no significant deformity or atrophy Skin- no rash or lesion Psych- euthymic mood, full affect Neuro- strength and sensation are intact  EKG-Aflutter with variable block, IRBBB, QRS int 114 ms, QTc 490 ms  Assessment and Plan: 1. Persistent afib/flutter Set up for DCCV tomorrow at 2pm. Continue Tikosyn, metoprolol Continue Eliquis 5 mg bid, has not mised doses Pre procedure labs today  2. OSA Using cpap without fail  3. Obesity Weight up 5 lbs.   F/u in afib clinic one week after cardioversion and with [name] 9/19 as scheduled  Harold Scott, [facility] [address] [place], [place] [address] [phone]

## 2015-04-12 ENCOUNTER — Encounter (HOSPITAL_COMMUNITY): Payer: Self-pay | Admitting: Anesthesiology

## 2015-04-12 ENCOUNTER — Ambulatory Visit (HOSPITAL_COMMUNITY)
Admission: RE | Admit: 2015-04-12 | Discharge: 2015-04-12 | Disposition: A | Discharge status: Home / Self Care | Payer: 59 | Source: Ambulatory Visit | Attending: Nurse Practitioner | Admitting: Nurse Practitioner

## 2015-04-12 ENCOUNTER — Other Ambulatory Visit: Payer: Self-pay

## 2015-04-12 ENCOUNTER — Ambulatory Visit (HOSPITAL_COMMUNITY): Admission: RE | Admit: 2015-04-12 | Payer: 59 | Source: Ambulatory Visit | Admitting: Cardiovascular Disease

## 2015-04-12 ENCOUNTER — Encounter (HOSPITAL_COMMUNITY): Admission: RE | Payer: Self-pay | Source: Ambulatory Visit

## 2015-04-12 ENCOUNTER — Encounter (HOSPITAL_COMMUNITY): Payer: Self-pay | Admitting: Cardiovascular Disease

## 2015-04-12 DIAGNOSIS — R9431 Abnormal electrocardiogram [ECG] [EKG]: Secondary | ICD-10-CM | POA: Insufficient documentation

## 2015-04-12 DIAGNOSIS — I4819 Other persistent atrial fibrillation: Secondary | ICD-10-CM

## 2015-04-12 DIAGNOSIS — I4892 Unspecified atrial flutter: Secondary | ICD-10-CM

## 2015-04-12 DIAGNOSIS — I4891 Unspecified atrial fibrillation: Secondary | ICD-10-CM | POA: Insufficient documentation

## 2015-04-12 SURGERY — CARDIOVERSION
Anesthesia: Monitor Anesthesia Care

## 2015-04-12 NOTE — Progress Notes (Signed)
Patient ID: Harold Scott, male   DOB: October 01, 1960, 54 y.o.   MRN: [id number] Pt was scheduled for DCCV today after having afib persistently since last Thursday. Called this am and felt like he was back in rhythm. EKG confirmed NSR at 71 bpm, Pr int 154 ms, QRS int 108 ms, QTc 486 ms. Cardioversion is cancelled.

## 2015-04-12 NOTE — Progress Notes (Signed)
Pt had cardioversion schdueled for today 04/12/15 at 2pm. Pt started feeling better night before, pt was instructed to come in for EKG only before procedure per Roderic Palau.

## 2015-04-13 ENCOUNTER — Encounter: Payer: 59 | Admitting: Internal Medicine

## 2015-04-14 ENCOUNTER — Telehealth (HOSPITAL_COMMUNITY): Payer: Self-pay | Admitting: Nurse Practitioner

## 2015-04-14 NOTE — Telephone Encounter (Signed)
Phone call from pt due to being  extremely frustrated because he went back into afib. He was out of rhythm x 5 days last week and was set up for DCCV only to return to SR the morning of , so DCCV was cancelled. He wants something else done. Discussed with [name] and will get him a f/u with [name] asap.

## 2015-04-18 ENCOUNTER — Other Ambulatory Visit (HOSPITAL_COMMUNITY): Payer: Self-pay | Admitting: *Deleted

## 2015-04-18 ENCOUNTER — Ambulatory Visit (INDEPENDENT_AMBULATORY_CARE_PROVIDER_SITE_OTHER): Payer: PRIVATE HEALTH INSURANCE | Admitting: Internal Medicine

## 2015-04-18 VITALS — BP 132/74 | HR 100 | Ht 68.0 in | Wt 286.0 lb

## 2015-04-18 DIAGNOSIS — G4733 Obstructive sleep apnea (adult) (pediatric): Secondary | ICD-10-CM

## 2015-04-18 DIAGNOSIS — I4819 Other persistent atrial fibrillation: Secondary | ICD-10-CM

## 2015-04-18 DIAGNOSIS — I481 Persistent atrial fibrillation: Secondary | ICD-10-CM

## 2015-04-18 LAB — T4, FREE: Free T4: 0.78 ng/dL (ref 0.60–1.60)

## 2015-04-18 MED ORDER — AMIODARONE HCL 200 MG PO TABS: 400.0000 mg | ORAL_TABLET | Freq: Two times a day (BID) | ORAL | Status: DC

## 2015-04-18 NOTE — Progress Notes (Signed)
Electrophysiology Office Note   Date:  04/18/2015   ID:  Harold Scott, DOB 03-Aug-1961, MRN [id number]  PCP:  [name], MD   Primary Electrophysiologist: Shaelin Lalley   Chief Complaint  Patient presents with  . Persistent AFIB     History of Present Illness: Harold Scott is a 54 y.o. male who presents today for electrophysiology evaluation.   His afib continues to increase in both frequency and duration despite recent ablation x 2.  He finds this disturbing with symptoms of SOB, palpitations, and fatigue.  He is unaware of triggers/ precipitants.  He reports compliance with tikosyn.  He has had very little success with weight recuction.  He is compliant with CPAP.   Today, he denies symptoms of  chest pain, orthopnea, PND, lower extremity edema, claudication, dizziness, presyncope, syncope, bleeding, or neurologic sequela. The patient is tolerating medications without difficulties and is otherwise without complaint today.    Past Medical History  Diagnosis Date  . Anxiety   . Depression   . GERD (gastroesophageal reflux disease)   . Gallstones   . Asthma   . PONV (postoperative nausea and vomiting)   . Paroxysmal atrial fibrillation     a.  previously intolerant of Flecainide and Multaq b. PVI by Dr [name] 09/21/14  . Obstructive sleep apnea     a. on CPAP, followed by [facility]   Past Surgical History  Procedure Laterality Date  . Hand surgery    . Tonsillectomy    . Wisdom tooth extraction    . Cholecystectomy  08/05/2012    Procedure: LAPAROSCOPIC CHOLECYSTECTOMY WITH INTRAOPERATIVE CHOLANGIOGRAM;  Surgeon: [name], MD,FACS;  Location: [facility];  Service: General;  Laterality: N/A;  . Laparoscopic appendectomy N/A 02/13/2014    Procedure: APPENDECTOMY LAPAROSCOPIC;  Surgeon: [name], MD;  Location: [facility];  Service: General;  Laterality: N/A;  . Tee without cardioversion N/A 09/20/2014    Procedure: TRANSESOPHAGEAL ECHOCARDIOGRAM (TEE);  Surgeon: [name], MD;  Location: [place];  Service: Cardiovascular;  Laterality: N/A;  . Ablation  09/21/14    PVI by Dr [name]  . Atrial fibrillation ablation N/A 09/21/2014    Procedure: ATRIAL FIBRILLATION ABLATION;  Surgeon: [name], MD;  Location: [facility] CATH LAB;  Service: Cardiovascular;  Laterality: N/A;  . Tee without cardioversion N/A 01/10/2015    Procedure: TRANSESOPHAGEAL ECHOCARDIOGRAM (TEE);  Surgeon: Sanda Klein, MD;  Location: [facility] ENDOSCOPY;  Service: Cardiovascular;  Laterality: N/A;  . Electrophysiologic study N/A 01/11/2015    Procedure: Atrial Fibrillation Ablation;  Surgeon: [name], MD;  Location: [facility] CV LAB;  Service: Cardiovascular;  Laterality: N/A;     Current Outpatient Prescriptions  Medication Sig Dispense Refill  . apixaban (ELIQUIS) 5 MG TABS tablet Take 5 mg by mouth 2 (two) times daily.    . cholestyramine (QUESTRAN) 4 G packet TAKE 2 PACKETS DAILY (Patient taking differently: TAKE 2 PACKETS BY MOUTH DAILY) 60 packet 10  . citalopram (CELEXA) 10 MG tablet TAKE 1 TABLET BY MOUTH ONCE DAILY 90 tablet 1  . clorazepate (TRANXENE) 7.5 MG tablet TAKE 1 TABLET BY MOUTH AT BEDTIME 30 tablet 5  . dofetilide (TIKOSYN) 500 MCG capsule Take 1 capsule (500 mcg total) by mouth 2 (two) times daily. 14 capsule 0  . esomeprazole (NEXIUM) 40 MG capsule Take 40 mg by mouth daily at 12 noon.    . furosemide (LASIX) 40 MG tablet Take 1 tablet (40 mg total) by mouth daily. [place]  tablet 6  . guaiFENesin 200 MG tablet Take 200 mg by mouth 2 (two) times daily as needed (for cold).    Marland Kitchen losartan (COZAAR) 50 MG tablet Take 1 tablet (50 mg total) by mouth daily. 90 tablet 6  . metoprolol succinate (TOPROL-XL) 25 MG 24 hr tablet Take 1 tablet (25 mg total) by mouth 2 (two) times daily. 60 tablet 1  . potassium chloride SA (K-DUR,KLOR-CON) 20 MEQ tablet Take 1 tablet (20 mEq total) by mouth daily. 30 tablet 6  . simvastatin (ZOCOR) 20 MG tablet Take 1 tablet (20 mg total) by mouth  daily at 6 PM. 30 tablet 11   No current facility-administered medications for this visit.    Allergies:   Flecainide; Multaq; and Penicillins   Social History:  The patient  reports that he quit smoking about 8 years ago. His smoking use included Cigarettes. He has a 15 pack-year smoking history. He has never used smokeless tobacco. He reports that he drinks about 4.2 - 8.4 oz of alcohol per week. He reports that he does not use illicit drugs.   Family History:  Adopted and does not know FH    ROS:  Please see the history of present illness.     All other systems are reviewed and negative.    PHYSICAL EXAM: VS:  BP 132/74 mmHg  Pulse 100  Ht 5\' 8"  (1.727 m)  Wt 129.729 kg (286 lb)  BMI 43.50 kg/m2 , BMI Body mass index is 43.5 kg/(m^2). GEN: overweight, in no acute distress HEENT: normal Neck: no JVD, carotid bruits, or masses Cardiac: iRRR; no murmurs, rubs, or gallops,no edema  Respiratory:  clear to auscultation bilaterally, normal work of breathing GI: soft, nontender, nondistended, + BS MS: no deformity or atrophy Skin: warm and dry,  Neuro:  Strength and sensation are intact Psych: euthymic mood, full affect  EKG:  EKG is ordered today. The ekg ordered today shows coarse AF today   Recent Labs: 07/18/2014: Pro B Natriuretic peptide (BNP) 1325.0* 12/21/2014: ALT 69*; B Natriuretic Peptide 318.2* 04/11/2015: BUN 10; Creatinine, Ser 0.86; Hemoglobin 14.4; Magnesium 2.0; Platelets 246; Potassium 4.2; Sodium 136; TSH 6.568*    Lipid Panel     Component Value Date/Time   CHOL 228* 07/19/2014 0425   TRIG 338* 07/19/2014 0425   HDL 30* 07/19/2014 0425   CHOLHDL 7.6 07/19/2014 0425   VLDL 68* 07/19/2014 0425   LDLCALC 130* 07/19/2014 0425     Wt Readings from Last 3 Encounters:  04/18/15 129.729 kg (286 lb)  04/11/15 129.457 kg (285 lb 6.4 oz)  02/08/15 129.275 kg (285 lb)     ASSESSMENT AND PLAN:  1.  Paroxysmal atrial fibrillation Unfortunately his afib  continues to worsen despite ablation x 2.  He does not tolerate this well.  He has failed medical therapy.  I have reviewed ekgs which reveal atypical atrial flutter and coarse AF.  Our options are quite limited presently. Today, we discussed rate control, amiodarone, and referral to Dr Roxy Manns for surgical MAZE as options.  Risks, benefits, and alternatives to each option were discussed.  At this time, he would like to consider amiodarone.  He is aware that risks include but are not limited to damage to eyes, liver, thyroid, lungs, and rarely death.  He would like to proceed. I will therefore stop tikosyn today.  After 72 hour washout, he will start amiodarone 400mg  BID.  Will obtain baseline PFTs Follow-up with Butch Penny in 4 weeks.  If  still in AF will need cardioversion at that time and reduction in dose to 200mg  BID.  Very refractory atrial arrhythmias Today, I have spent 22minutes with the patient discussing afib management .  More than 50% of the visit time today was spent on this issue.    2. OSA Compliance with CPAP is encouraged  3. Overweight. Weight loss again advised  4. Elevated TSH Check T4 today Repeat lfts, tfts in 4 weeks   Current medicines are reviewed at length with the patient today.   The patient is does not have concerns regarding his medicines.     Follow-up with Butch Penny in 4 weeks. I will see in 3 months  Signed, [name], MD  04/18/2015 3:39 PM      [place] [address] [phone] (office) [phone] (fax)

## 2015-04-18 NOTE — Patient Instructions (Signed)
Medication Instructions:  Your physician has recommended you make the following change in your medication:  1) Stop Tikosyn 2) Start Amiodarone 400mg  by mouth twice daily ---start 04/21/15    Labwork: Your physician recommends that you return for lab work today: Free T 4   Testing/Procedures: Your physician has recommended that you have a pulmonary function test. Pulmonary Function Tests are a group of tests that measure how well air moves in and out of your lungs.    Follow-Up: Your physician recommends that you schedule a follow-up appointment in: 4 weeks with Roderic Palau, NP and 3 months with Dr [name]    Any Other Special Instructions Will Be Listed Below (If Applicable).

## 2015-04-19 ENCOUNTER — Ambulatory Visit (HOSPITAL_COMMUNITY): Payer: PRIVATE HEALTH INSURANCE | Admitting: Nurse Practitioner

## 2015-04-22 ENCOUNTER — Telehealth (HOSPITAL_COMMUNITY): Payer: Self-pay

## 2015-04-22 NOTE — Telephone Encounter (Signed)
Pt called and asked for Harold Scott then stated he has been in Afib for a week now and that he just started the amiodarone today, but didn't think it would work quick enough. I told Harold Scott that Harold Scott and Harold Scott are out of the office and I could assist him with the nurse on call [employer]), pt declined and stated he would wait until Harold Scott and Harold Scott returned on 04/26/15.

## 2015-04-27 ENCOUNTER — Telehealth (HOSPITAL_COMMUNITY): Payer: Self-pay | Admitting: *Deleted

## 2015-04-27 NOTE — Telephone Encounter (Signed)
Pt called in concerned because he has been in afib since last week. Is scared he is going to die and wants cardioversion tomorrow. Educated patient that amiodarone take several weeks to "load" in his system and a cardioversion would likely not hold since he has only been on amiodarone for a week. Encouraged patient to try taking the PRN cardizem prescribed for elevated HR but pt states this makes him feel dizzy and blurred vision.  Suggested trying 1/2 tablet of cardizem and see if this helps with the side effects. His HR is running between 90-120s.  Told pt to watch his fluid volume and if increased weight to let us know since mainly staying in afib. He says his weight has been stable and he has been taking his lasix daily as prescribed.  Patient to call back if afib symptoms increase where he cannot tolerate them but reinforced that afib is not life threatening but  he should continue taking his blood thinner as directed.  Patient was appreciative of the advice and will call back if further issues arise.

## 2015-05-03 ENCOUNTER — Ambulatory Visit: Payer: PRIVATE HEALTH INSURANCE | Admitting: Pulmonary Disease

## 2015-05-07 ENCOUNTER — Other Ambulatory Visit: Payer: Self-pay | Admitting: Family Medicine

## 2015-05-09 ENCOUNTER — Encounter: Payer: 59 | Admitting: Internal Medicine

## 2015-05-10 ENCOUNTER — Telehealth (HOSPITAL_COMMUNITY): Payer: Self-pay | Admitting: *Deleted

## 2015-05-10 ENCOUNTER — Ambulatory Visit (HOSPITAL_COMMUNITY)
Admission: RE | Admit: 2015-05-10 | Discharge: 2015-05-10 | Disposition: A | Discharge status: Home / Self Care | Payer: PRIVATE HEALTH INSURANCE | Source: Ambulatory Visit | Attending: Nurse Practitioner | Admitting: Nurse Practitioner

## 2015-05-10 VITALS — BP 134/86 | HR 56 | Ht 68.0 in | Wt 289.0 lb

## 2015-05-10 DIAGNOSIS — Z79899 Other long term (current) drug therapy: Secondary | ICD-10-CM | POA: Insufficient documentation

## 2015-05-10 DIAGNOSIS — F419 Anxiety disorder, unspecified: Secondary | ICD-10-CM | POA: Diagnosis not present

## 2015-05-10 DIAGNOSIS — F329 Major depressive disorder, single episode, unspecified: Secondary | ICD-10-CM | POA: Insufficient documentation

## 2015-05-10 DIAGNOSIS — I481 Persistent atrial fibrillation: Secondary | ICD-10-CM | POA: Diagnosis present

## 2015-05-10 DIAGNOSIS — G4733 Obstructive sleep apnea (adult) (pediatric): Secondary | ICD-10-CM | POA: Diagnosis not present

## 2015-05-10 DIAGNOSIS — Z87891 Personal history of nicotine dependence: Secondary | ICD-10-CM | POA: Insufficient documentation

## 2015-05-10 DIAGNOSIS — R0600 Dyspnea, unspecified: Secondary | ICD-10-CM | POA: Insufficient documentation

## 2015-05-10 DIAGNOSIS — I4819 Other persistent atrial fibrillation: Secondary | ICD-10-CM

## 2015-05-10 DIAGNOSIS — J45909 Unspecified asthma, uncomplicated: Secondary | ICD-10-CM | POA: Insufficient documentation

## 2015-05-10 DIAGNOSIS — Z7902 Long term (current) use of antithrombotics/antiplatelets: Secondary | ICD-10-CM | POA: Insufficient documentation

## 2015-05-10 DIAGNOSIS — K219 Gastro-esophageal reflux disease without esophagitis: Secondary | ICD-10-CM | POA: Insufficient documentation

## 2015-05-10 DIAGNOSIS — Z880 Allergy status to penicillin: Secondary | ICD-10-CM | POA: Insufficient documentation

## 2015-05-10 MED ORDER — AMIODARONE HCL 200 MG PO TABS: 200.0000 mg | ORAL_TABLET | Freq: Two times a day (BID) | ORAL | Status: DC

## 2015-05-10 NOTE — Telephone Encounter (Signed)
Patient called in stating ever since starting amiodarone he gets short of breath with exertion. He is currently in NSR HR in 60s.  Concerned because he is starting feel the same he did with flecainide.  Patient to come in at 3pm today to be assessed.

## 2015-05-10 NOTE — Patient Instructions (Signed)
Your physician has recommended you make the following change in your medication:  1)Decrease Amiodarone 200mg  twice a day  Parking code for oct 0900

## 2015-05-11 ENCOUNTER — Encounter (HOSPITAL_COMMUNITY): Payer: Self-pay | Admitting: Nurse Practitioner

## 2015-05-11 NOTE — Addendum Note (Signed)
Encounter addended by: Sherran Needs, NP on: 05/11/2015 11:16 AM<BR>     Documentation filed: Follow-up Section, LOS Section

## 2015-05-11 NOTE — Progress Notes (Signed)
Patient ID: Harold Scott, male   DOB: 04/09/1961, 54 y.o.   MRN: [id number]     Primary Care Physician: [name], MD Referring Physician: Dr. Minette Headland is a 54 y.o. male with a h/o persistent afib that has really struggled in efforts to maintain SR. He has had 2 ablations in the last year which failed to keep in [place] and he has failed tikosyn. He saw [name] 3 weeks ago and was started on amiodarone 400 bid. He asked to be seen today for shortness of breath and fatigue although he has been in SR for the last 3 days.. Prior to that he had been in afib x 4 days. Despite being in rhythm he is concerned re his current symptoms. He is pending PFT's on Monday and f/u with pulmonology the following Thursday.   Today, he denies symptoms of palpitations, chest pain,  orthopnea, PND, lower extremity edema, dizziness, presyncope, syncope, or neurologic sequela. Positive for fatigue and shortness of breath. The patient is tolerating medications without difficulties and is otherwise without complaint today.   Past Medical History  Diagnosis Date  . Anxiety   . Depression   . GERD (gastroesophageal reflux disease)   . Gallstones   . Asthma   . PONV (postoperative nausea and vomiting)   . Paroxysmal atrial fibrillation     a.  previously intolerant of Flecainide and Multaq b. PVI by Dr [name] 09/21/14  . Obstructive sleep apnea     a. on CPAP, followed by [facility]   Past Surgical History  Procedure Laterality Date  . Hand surgery    . Tonsillectomy    . Wisdom tooth extraction    . Cholecystectomy  08/05/2012    Procedure: LAPAROSCOPIC CHOLECYSTECTOMY WITH INTRAOPERATIVE CHOLANGIOGRAM;  Surgeon: [name], MD,FACS;  Location: [facility];  Service: General;  Laterality: N/A;  . Laparoscopic appendectomy N/A 02/13/2014    Procedure: APPENDECTOMY LAPAROSCOPIC;  Surgeon: [name], MD;  Location: [facility];  Service: General;  Laterality: N/A;  . Tee without cardioversion N/A  09/20/2014    Procedure: TRANSESOPHAGEAL ECHOCARDIOGRAM (TEE);  Surgeon: [name], MD;  Location: [facility];  Service: Cardiovascular;  Laterality: N/A;  . Ablation  09/21/14    PVI by Dr [name]  . Atrial fibrillation ablation N/A 09/21/2014    Procedure: ATRIAL FIBRILLATION ABLATION;  Surgeon: [name], MD;  Location: [facility] CATH LAB;  Service: Cardiovascular;  Laterality: N/A;  . Tee without cardioversion N/A 01/10/2015    Procedure: TRANSESOPHAGEAL ECHOCARDIOGRAM (TEE);  Surgeon: Sanda Klein, MD;  Location: [facility] ENDOSCOPY;  Service: Cardiovascular;  Laterality: N/A;  . Electrophysiologic study N/A 01/11/2015    Procedure: Atrial Fibrillation Ablation;  Surgeon: [name], MD;  Location: [facility] CV LAB;  Service: Cardiovascular;  Laterality: N/A;    Current Outpatient Prescriptions  Medication Sig Dispense Refill  . amiodarone (PACERONE) 200 MG tablet Take 1 tablet (200 mg total) by mouth 2 (two) times daily. 120 tablet 3  . apixaban (ELIQUIS) 5 MG TABS tablet Take 5 mg by mouth 2 (two) times daily.    . cholestyramine (QUESTRAN) 4 G packet TAKE 2 PACKETS DAILY 60 packet 0  . citalopram (CELEXA) 10 MG tablet TAKE 1 TABLET BY MOUTH ONCE DAILY 90 tablet 1  . clorazepate (TRANXENE) 7.5 MG tablet TAKE 1 TABLET BY MOUTH AT BEDTIME 30 tablet 5  . esomeprazole (NEXIUM) 40 MG capsule Take 40 mg by mouth daily at 12 noon.    Marland Kitchen  furosemide (LASIX) 40 MG tablet Take 1 tablet (40 mg total) by mouth daily. 30 tablet 6  . guaiFENesin 200 MG tablet Take 200 mg by mouth 2 (two) times daily as needed (for cold).    Marland Kitchen losartan (COZAAR) 50 MG tablet Take 1 tablet (50 mg total) by mouth daily. 90 tablet 6  . metoprolol succinate (TOPROL-XL) 25 MG 24 hr tablet Take 1 tablet (25 mg total) by mouth 2 (two) times daily. 60 tablet 1  . potassium chloride SA (K-DUR,KLOR-CON) 20 MEQ tablet Take 1 tablet (20 mEq total) by mouth daily. 30 tablet 6  . simvastatin (ZOCOR) 20 MG tablet Take 1 tablet (20 mg  total) by mouth daily at 6 PM. 30 tablet 11   No current facility-administered medications for this encounter.    Allergies  Allergen Reactions  . Flecainide Other (See Comments)    EXTREME SOB  . Multaq [Dronedarone] Shortness Of Breath  . Penicillins Hives    Social History   Social History  . Marital Status: Married    Spouse Name: N/A  . Number of Children: 1  . Years of Education: N/A   Occupational History  . Courier    Social History Main Topics  . Smoking status: Former Smoker -- 0.50 packs/day for 30 years    Types: Cigarettes    Quit date: 07/23/2006  . Smokeless tobacco: Never Used  . Alcohol Use: 4.2 - 8.4 oz/week    7-14 Standard drinks or equivalent per week     Comment: social  . Drug Use: No  . Sexual Activity: Not on file   Other Topics Concern  . Not on file   Social History Narrative   Pt works as a Secondary school teacher    Family History  Problem Relation Age of Onset  . Adopted: Yes  . Other      ADOPTED    ROS- All systems are reviewed and negative except as per the HPI above  Physical Exam: Filed Vitals:   05/10/15 1519  BP: 134/86  Pulse: 56  Height: 5\' 8"  (1.727 m)  Weight: 289 lb (131.09 kg)   Pulse Ox at rest 97%. With walking PO at lowest point 93%, HR increased from 55 to 80. No dyspnea with walking but at end of walk, breathing efforts increased.  GEN- The patient is well appearing, alert and oriented x 3 today.   Head- normocephalic, atraumatic Eyes-  Sclera clear, conjunctiva pink Ears- hearing intact Oropharynx- clear Neck- supple, no JVP Lymph- no cervical lymphadenopathy Lungs- Clear to ausculation bilaterally, normal work of breathing Heart- Regular rate and rhythm, no murmurs, rubs or gallops, PMI not laterally displaced GI- soft, NT, ND, + BS Extremities- no clubbing, cyanosis, or edema MS- no significant deformity or atrophy Skin- no rash or lesion Psych- euthymic mood, full affect Neuro- strength and sensation  are intact  EKG- Sinus brady, rt atrial enlargement, IRBBB Pr int 166 ms, QRS 112 ms, QTc 467 ms. Dr. Jackalyn Lombard last note reviewed  Assessment and Plan: 1. Persistent symptomatic afib  Now in SR with amiodarone load, but may be symptomatic from amiodarone. Will reduce to 200 mg bid.  Cautioned that he may have more afib with reduction of amiodarone Continue metoprolol Continue eliquis  2. Acute on chronic dyspnea Using cpap Again may be from higher doses of amiodarone and will reduce dose Scheduled for PFT's on Monday and f/u with pulmonologist on next Thursday No extra fluid on board No PND, orthopnea   F/u  with afib clinic in 1-2 weeks following pulmonary eval.   Butch Penny C. Carroll, [facility] [address] [facility] [employer], [place] [address] [phone]

## 2015-05-12 ENCOUNTER — Telehealth (HOSPITAL_COMMUNITY): Payer: Self-pay | Admitting: Nurse Practitioner

## 2015-05-12 NOTE — Telephone Encounter (Signed)
Per Dr. Jackalyn Lombard recommendation, go ahead and reduce amiodarone to 200 mg a day. Pt informed. He is feeling better on reduced dose of amiodarone from 400 mg bid to 200 mg bid. He is back in afib but feels better because rate is controlled in the 60's. Has f/u appointment next week.

## 2015-05-13 ENCOUNTER — Other Ambulatory Visit: Payer: Self-pay | Admitting: Internal Medicine

## 2015-05-13 DIAGNOSIS — R0600 Dyspnea, unspecified: Secondary | ICD-10-CM

## 2015-05-16 ENCOUNTER — Ambulatory Visit (HOSPITAL_COMMUNITY): Payer: PRIVATE HEALTH INSURANCE | Admitting: Nurse Practitioner

## 2015-05-16 ENCOUNTER — Ambulatory Visit (INDEPENDENT_AMBULATORY_CARE_PROVIDER_SITE_OTHER): Payer: PRIVATE HEALTH INSURANCE | Admitting: Internal Medicine

## 2015-05-16 DIAGNOSIS — R0600 Dyspnea, unspecified: Secondary | ICD-10-CM | POA: Diagnosis not present

## 2015-05-16 LAB — PULMONARY FUNCTION TEST
DL/VA % PRED: 120 %
DL/VA: 5.48 ml/min/mmHg/L
DLCO UNC % PRED: 88 %
DLCO UNC: 26.7 ml/min/mmHg
FEF 25-75 POST: 2.5 L/s
FEF 25-75 PRE: 1.81 L/s
FEF2575-%Change-Post: 37 %
FEF2575-%PRED-PRE: 57 %
FEF2575-%Pred-Post: 79 %
FEV1-%Change-Post: 8 %
FEV1-%PRED-POST: 69 %
FEV1-%Pred-Pre: 64 %
FEV1-PRE: 2.34 L
FEV1-Post: 2.53 L
FEV1FVC-%Change-Post: 2 %
FEV1FVC-%Pred-Pre: 98 %
FEV6-%CHANGE-POST: 5 %
FEV6-%PRED-POST: 72 %
FEV6-%PRED-PRE: 68 %
FEV6-PRE: 3.08 L
FEV6-Post: 3.26 L
FEV6FVC-%PRED-POST: 104 %
FEV6FVC-%PRED-PRE: 104 %
FVC-%Change-Post: 5 %
FVC-%Pred-Post: 69 %
FVC-%Pred-Pre: 65 %
FVC-Post: 3.26 L
FVC-Pre: 3.08 L
POST FEV6/FVC RATIO: 100 %
PRE FEV1/FVC RATIO: 76 %
Post FEV1/FVC ratio: 78 %
Pre FEV6/FVC Ratio: 100 %
RV % pred: 57 %
RV: 1.16 L
TLC % PRED: 68 %
TLC: 4.54 L

## 2015-05-16 NOTE — Progress Notes (Signed)
PFT done today. 

## 2015-05-17 ENCOUNTER — Ambulatory Visit (INDEPENDENT_AMBULATORY_CARE_PROVIDER_SITE_OTHER): Payer: PRIVATE HEALTH INSURANCE | Admitting: *Deleted

## 2015-05-17 DIAGNOSIS — Z23 Encounter for immunization: Secondary | ICD-10-CM

## 2015-05-19 ENCOUNTER — Encounter: Payer: Self-pay | Admitting: Pulmonary Disease

## 2015-05-19 ENCOUNTER — Ambulatory Visit (HOSPITAL_COMMUNITY)
Admission: RE | Admit: 2015-05-19 | Discharge: 2015-05-19 | Disposition: A | Discharge status: Home / Self Care | Payer: 59 | Source: Ambulatory Visit | Attending: Pulmonary Disease | Admitting: Pulmonary Disease

## 2015-05-19 ENCOUNTER — Ambulatory Visit (INDEPENDENT_AMBULATORY_CARE_PROVIDER_SITE_OTHER): Payer: PRIVATE HEALTH INSURANCE | Admitting: Pulmonary Disease

## 2015-05-19 VITALS — BP 120/78 | HR 73 | Temp 98.5°F | Ht 68.0 in | Wt 287.0 lb

## 2015-05-19 DIAGNOSIS — I10 Essential (primary) hypertension: Secondary | ICD-10-CM | POA: Diagnosis not present

## 2015-05-19 DIAGNOSIS — I4891 Unspecified atrial fibrillation: Secondary | ICD-10-CM | POA: Insufficient documentation

## 2015-05-19 DIAGNOSIS — G4733 Obstructive sleep apnea (adult) (pediatric): Secondary | ICD-10-CM | POA: Diagnosis not present

## 2015-05-19 DIAGNOSIS — R0602 Shortness of breath: Secondary | ICD-10-CM | POA: Diagnosis present

## 2015-05-19 DIAGNOSIS — R0600 Dyspnea, unspecified: Secondary | ICD-10-CM | POA: Diagnosis not present

## 2015-05-19 DIAGNOSIS — R079 Chest pain, unspecified: Secondary | ICD-10-CM | POA: Insufficient documentation

## 2015-05-19 DIAGNOSIS — Z9989 Dependence on other enabling machines and devices: Principal | ICD-10-CM

## 2015-05-19 NOTE — Progress Notes (Signed)
Chief Complaint  Patient presents with  . Follow-up    Former [place] pt. pt states his sleeping has gotten a little worse. pt states hes been waling at 3 or 4 in the moirning than has trouble getting back to sleep. pt thinks it may bve the medication he is on for his heart. pt using CPAP every night for about 8 hours. pt states that he has woken up a few times snoring with the CPAP on. mask and pressure good for pt.  DME: [facility]    History of Present Illness: Harold Scott is a 54 y.o. male former smoker with OSA.  He has been using his CPAP.  He feels the pressure is okay.  He is still feeling sleepy during the day.  He has been followed by cardiology for A fib.  He has been on amiodarone.  He had PFT recently which showed mild restriction, but normal diffusion capacity.  He had CT chest which showed GGO in lower lungs.  He has noticed trouble with his breathing with activity.  He denies fever or sweats.  He gets occasional cough.  He denies skin rash or joint swelling.  He quit smoking in 2007.  He was seen in hospital in May 2016 by Dr. Chase Caller for dyspnea.  Serology at that time was negative.   TESTS: PSG 12/30/12 >> AHI 17 CT angio chest 12/21/14 >> GGO with reticulonodular densities in mid and lower lungs, motion artifact Echo 01/10/15 >> EF 60 to 65%, mild/mod MR PFT 05/16/15 >> FEV1 2.53 (69%), FEV1% 78, TLC 4.54 (68%), DLCO 88%, no BD CPAP 04/19/15 to 05/18/15 >> used on 30 of 30 nights with average 9 hrs and 54 min.  Average AHI is 0.5 with CPAP 10 cm H2O.  SEROLOGY: 12/21/14 >> ANCA, HIV, Jo-1 Ab, SS-A, SS-B, Scl 70, RF >> all negative   PMhx >> Anxiety, Depression, GERD, A fib  Past surgical hx, Medications, Allergies, Family hx, Social hx all reviewed.   Physical Exam: BP 120/78 mmHg  Pulse 73  Temp(Src) 98.5 F (36.9 C) (Oral)  Ht 5\' 8"  (1.727 m)  Wt 287 lb (130.182 kg)  BMI 43.65 kg/m2  SpO2 96%  General - No distress ENT - No sinus tenderness, no oral exudate, no  LAN Cardiac - s1s2 regular, no murmur Chest - No wheeze/rales/dullness Back - No focal tenderness Abd - Soft, non-tender Ext - No edema Neuro - Normal strength Skin - No rashes Psych - normal mood, and behavior   CMP Latest Ref Rng 04/11/2015 02/08/2015 01/12/2015  Glucose 65 - 99 mg/dL 110(H) 129(H) 154(H)  BUN 6 - 20 mg/dL 10 14 7   Creatinine 0.61 - 1.24 mg/dL 0.86 0.85 0.85  Sodium 135 - 145 mmol/L 136 136 133(L)  Potassium 3.5 - 5.1 mmol/L 4.2 3.9 4.5  Chloride 101 - 111 mmol/L 100(L) 100(L) 96(L)  CO2 22 - 32 mmol/L 29 28 28   Calcium 8.9 - 10.3 mg/dL 9.4 9.0 8.8(L)  Total Protein 6.5 - 8.1 g/dL - - -  Total Bilirubin 0.3 - 1.2 mg/dL - - -  Alkaline Phos 38 - 126 U/L - - -  AST 15 - 41 U/L - - -  ALT 17 - 63 U/L - - -    CBC Latest Ref Rng 04/11/2015 01/11/2015 12/27/2014  WBC 4.0 - 10.5 K/uL 8.2 8.5 11.5(H)  Hemoglobin 13.0 - 17.0 g/dL 14.4 12.6(L) 15.2  Hematocrit 39.0 - 52.0 % 42.7 37.5(L) 44.3  Platelets 150 - 400  K/uL 246 201 328.0       Assessment/Plan:  Obstructive sleep apnea. Plan: - continue CPAP 10 cm H2O - will arrange for overnight oximetry with CPAP  Dyspnea with restrictive defect on PFT. Plan: - repeat CXR today >> if abnormalities from May 2016 persist, then he will need high resolution CT chest  Obesity. Plan: - discussed importance of weight loss  Atrial fibrillation. Plan: - he is on amiodarone per cardiology   Chesley Mires, MD [facility] Pager:  [phone]

## 2015-05-19 NOTE — Patient Instructions (Signed)
Chest xray today Will arrange for overnight oxygen test with CPAP  Follow up in 1 year

## 2015-05-20 ENCOUNTER — Telehealth (HOSPITAL_COMMUNITY): Payer: Self-pay | Admitting: *Deleted

## 2015-05-20 ENCOUNTER — Telehealth: Payer: Self-pay | Admitting: Pulmonary Disease

## 2015-05-20 DIAGNOSIS — J849 Interstitial pulmonary disease, unspecified: Secondary | ICD-10-CM

## 2015-05-20 NOTE — Telephone Encounter (Signed)
Called and spoke with Harold Scott Harold Scott is requesting results of cxr Informed Harold Scott that once Dr Halford Chessman had resulted cxr we would call him back with results  Dr Halford Chessman, please advise on cxr results When we click on the link provided below your interpretation does not come up  Please advise. Thanks!

## 2015-05-20 NOTE — Telephone Encounter (Signed)
Patient called in due to being in afib for last 4 days.  Was pleased to report he actually feels better than he did even last week when in NSR (sob is much better).  HR is in 70s since being in afib only SOB with extreme exertion or activity.  Offered patient to be set up to see Flex PA at [address] office or just monitor symptoms over weekend (especially HF symptoms ie weight gain, increased sob, swelling) and will report on Monday how he is feeling.  May need cardioversion at some point since decreasing his amiodarone due to shortness of breath. Patient wanted to monitor symptoms over weekend and call Butch Penny on Monday. Instructed patient to take an extra lasix over weekend if he noticed 2-3lb weight gain or increased swelling.  Patient also reminded of when to go to ER.  Patient verbalized understanding and will call on Monday with report.

## 2015-05-20 NOTE — Telephone Encounter (Signed)
[phone] pt calling for results ok to speak to the wife also

## 2015-05-20 NOTE — Telephone Encounter (Signed)
Dg Chest 2 View  05/20/2015   CLINICAL DATA:  54 year old male with shortness of breath and intermittent chest pain for 2 years. Initial encounter. Hypertension and atrial fibrillation.  EXAM: CHEST  2 VIEW  COMPARISON:  Chest CTA 01/07/2015 and earlier.  FINDINGS: Stable lung volumes with chronic increased AP dimension to the chest. Stable cardiac size and mediastinal contours. Visualized tracheal air column is within normal limits. Coarse bilateral pulmonary interstitial opacity has mildly progressed since 2013. No pneumothorax, pulmonary edema, pleural effusion or confluent pulmonary opacity. No acute osseous abnormality identified. Stable cholecystectomy clips.  IMPRESSION: No acute cardiopulmonary abnormality.   Electronically Signed   By: Genevie Ann M.D.   On: 05/20/2015 08:17

## 2015-05-20 NOTE — Telephone Encounter (Signed)
Spoke with pt.  Dg Chest 2 View  05/20/2015   CLINICAL DATA:  54 year old male with shortness of breath and intermittent chest pain for 2 years. Initial encounter. Hypertension and atrial fibrillation.  EXAM: CHEST  2 VIEW  COMPARISON:  Chest CTA 01/07/2015 and earlier.  FINDINGS: Stable lung volumes with chronic increased AP dimension to the chest. Stable cardiac size and mediastinal contours. Visualized tracheal air column is within normal limits. Coarse bilateral pulmonary interstitial opacity has mildly progressed since 2013. No pneumothorax, pulmonary edema, pleural effusion or confluent pulmonary opacity. No acute osseous abnormality identified. Stable cholecystectomy clips.  IMPRESSION: No acute cardiopulmonary abnormality.   Electronically Signed   By: Genevie Ann M.D.   On: 05/20/2015 08:17     He has persistent changes on CXR.  Will repeat HRCT chest w/o contrast.

## 2015-05-24 ENCOUNTER — Inpatient Hospital Stay: Admission: RE | Admit: 2015-05-24 | Payer: PRIVATE HEALTH INSURANCE | Source: Ambulatory Visit

## 2015-05-25 ENCOUNTER — Ambulatory Visit (HOSPITAL_COMMUNITY)
Admission: RE | Admit: 2015-05-25 | Discharge: 2015-05-25 | Disposition: A | Discharge status: Home / Self Care | Payer: PRIVATE HEALTH INSURANCE | Source: Ambulatory Visit | Attending: Nurse Practitioner | Admitting: Nurse Practitioner

## 2015-05-25 ENCOUNTER — Telehealth: Payer: Self-pay | Admitting: Pulmonary Disease

## 2015-05-25 VITALS — BP 124/86 | HR 80 | Ht 68.0 in | Wt 289.2 lb

## 2015-05-25 DIAGNOSIS — I481 Persistent atrial fibrillation: Secondary | ICD-10-CM | POA: Diagnosis not present

## 2015-05-25 DIAGNOSIS — G4733 Obstructive sleep apnea (adult) (pediatric): Secondary | ICD-10-CM | POA: Diagnosis not present

## 2015-05-25 DIAGNOSIS — I4819 Other persistent atrial fibrillation: Secondary | ICD-10-CM

## 2015-05-25 DIAGNOSIS — I4891 Unspecified atrial fibrillation: Secondary | ICD-10-CM | POA: Diagnosis not present

## 2015-05-25 DIAGNOSIS — E669 Obesity, unspecified: Secondary | ICD-10-CM | POA: Diagnosis not present

## 2015-05-25 NOTE — Telephone Encounter (Signed)
Called and spoke with pt  Pt was wondering why he had not heard anything about scheduling his ONO Explained to pt that the order was placed at his OV and it can take 7-10 business days to hear back about scheduling Pt voiced understanding of this  Informed pt to call office back if he has not heard back after the above time frame  Nothing further is needed at this time.

## 2015-05-26 ENCOUNTER — Encounter (HOSPITAL_COMMUNITY): Payer: Self-pay | Admitting: Nurse Practitioner

## 2015-05-26 NOTE — Progress Notes (Signed)
Patient ID: Harold Scott, male   DOB: 1961/05/18, 54 y.o.   MRN: [id number]     Primary Care Physician: [name], MD Referring Physician: Dr. Minette Headland is a 54 y.o. male with a h/o persistent afib, ablation x 2 and has failed Tikosyn. He was seen recently in the afib clinic recently with c/o afib, dyspnea and fatigue after tikosyn was stopped and he was being loaded on amiodarone. His dose was decreased to 200 mg a day and he followed up with pulmonology with PFT's. He returns today with feeling better from a dyspnea/fatigue standpoint but he still is going in and out of afib. Pulmonologist told him that PFT's were OK and he could continue on amiodarone. Despite not being in rhythm full time, he feels improved on amiodarone in that his afib is not as rapid and not not as bothersome to him. He will be in rhythm x 3-4 days then in afib x 3-4 days.  Today, he denies symptoms of palpitations, chest pain, shortness of breath, orthopnea, PND, lower extremity edema, dizziness, presyncope, syncope, or neurologic sequela. The patient is tolerating medications without difficulties and is otherwise without complaint today.   Past Medical History  Diagnosis Date  . Anxiety   . Depression   . GERD (gastroesophageal reflux disease)   . Gallstones   . PONV (postoperative nausea and vomiting)   . Paroxysmal atrial fibrillation (HCC)     a.  previously intolerant of Flecainide and Multaq b. PVI by Dr [name] 09/21/14  . Obstructive sleep apnea     a. on CPAP, followed by [facility]   Past Surgical History  Procedure Laterality Date  . Hand surgery    . Tonsillectomy    . Wisdom tooth extraction    . Cholecystectomy  08/05/2012    Procedure: LAPAROSCOPIC CHOLECYSTECTOMY WITH INTRAOPERATIVE CHOLANGIOGRAM;  Surgeon: [name], MD,FACS;  Location: [facility];  Service: General;  Laterality: N/A;  . Laparoscopic appendectomy N/A 02/13/2014    Procedure: APPENDECTOMY LAPAROSCOPIC;  Surgeon:  [name], MD;  Location: [facility];  Service: General;  Laterality: N/A;  . Tee without cardioversion N/A 09/20/2014    Procedure: TRANSESOPHAGEAL ECHOCARDIOGRAM (TEE);  Surgeon: [name], MD;  Location: [facility];  Service: Cardiovascular;  Laterality: N/A;  . Ablation  09/21/14    PVI by Dr [name]  . Atrial fibrillation ablation N/A 09/21/2014    Procedure: ATRIAL FIBRILLATION ABLATION;  Surgeon: [name], MD;  Location: [facility] CATH LAB;  Service: Cardiovascular;  Laterality: N/A;  . Tee without cardioversion N/A 01/10/2015    Procedure: TRANSESOPHAGEAL ECHOCARDIOGRAM (TEE);  Surgeon: Sanda Klein, MD;  Location: [facility] ENDOSCOPY;  Service: Cardiovascular;  Laterality: N/A;  . Electrophysiologic study N/A 01/11/2015    Procedure: Atrial Fibrillation Ablation;  Surgeon: [name], MD;  Location: [facility] CV LAB;  Service: Cardiovascular;  Laterality: N/A;    Current Outpatient Prescriptions  Medication Sig Dispense Refill  . amiodarone (PACERONE) 200 MG tablet Take 1 tablet (200 mg total) by mouth 2 (two) times daily. 120 tablet 3  . apixaban (ELIQUIS) 5 MG TABS tablet Take 5 mg by mouth 2 (two) times daily.    . cholestyramine (QUESTRAN) 4 G packet TAKE 2 PACKETS DAILY 60 packet 0  . citalopram (CELEXA) 10 MG tablet TAKE 1 TABLET BY MOUTH ONCE DAILY 90 tablet 1  . clorazepate (TRANXENE) 7.5 MG tablet TAKE 1 TABLET BY MOUTH AT BEDTIME 30 tablet 5  . esomeprazole (  NEXIUM) 40 MG capsule Take 40 mg by mouth daily at 12 noon.    . furosemide (LASIX) 40 MG tablet Take 1 tablet (40 mg total) by mouth daily. 30 tablet 6  . guaiFENesin 200 MG tablet Take 200 mg by mouth 2 (two) times daily as needed (for cold).    Marland Kitchen losartan (COZAAR) 50 MG tablet Take 1 tablet (50 mg total) by mouth daily. 90 tablet 6  . metoprolol succinate (TOPROL-XL) 25 MG 24 hr tablet Take 1 tablet (25 mg total) by mouth 2 (two) times daily. 60 tablet 1  . potassium chloride SA (K-DUR,KLOR-CON) 20 MEQ tablet Take 1  tablet (20 mEq total) by mouth daily. 30 tablet 6  . simvastatin (ZOCOR) 20 MG tablet Take 1 tablet (20 mg total) by mouth daily at 6 PM. 30 tablet 11   No current facility-administered medications for this encounter.    Allergies  Allergen Reactions  . Flecainide Other (See Comments)    EXTREME SOB  . Multaq [Dronedarone] Shortness Of Breath  . Penicillins Hives    Social History   Social History  . Marital Status: Married    Spouse Name: N/A  . Number of Children: 1  . Years of Education: N/A   Occupational History  . Courier    Social History Main Topics  . Smoking status: Former Smoker -- 0.50 packs/day for 30 years    Types: Cigarettes    Quit date: 07/23/2006  . Smokeless tobacco: Never Used  . Alcohol Use: 4.2 - 8.4 oz/week    7-14 Standard drinks or equivalent per week     Comment: social  . Drug Use: No  . Sexual Activity: Not on file   Other Topics Concern  . Not on file   Social History Narrative   Pt works as a Secondary school teacher    Family History  Problem Relation Age of Onset  . Adopted: Yes  . Other      ADOPTED    ROS- All systems are reviewed and negative except as per the HPI above  Physical Exam: Filed Vitals:   05/25/15 1551  BP: 124/86  Pulse: 80  Height: 5\' 8"  (1.727 m)  Weight: 289 lb 3.2 oz (131.18 kg)    GEN- The patient is well appearing, alert and oriented x 3 today.   Head- normocephalic, atraumatic Eyes-  Sclera clear, conjunctiva pink Ears- hearing intact Oropharynx- clear Neck- supple, no JVP Lymph- no cervical lymphadenopathy Lungs- Clear to ausculation bilaterally, normal work of breathing Heart- Regular rate and rhythm, no murmurs, rubs or gallops, PMI not laterally displaced GI- soft, NT, ND, + BS Extremities- no clubbing, cyanosis, or edema MS- no significant deformity or atrophy Skin- no rash or lesion Psych- euthymic mood, full affect Neuro- strength and sensation are intact  EKG- Aflutter with variable AV  block, IRBBB, cannot r/o inferior infarct, prolonged QTc.  Assessment and Plan: 1. Afib In and out of afib but feels less symptomatic on amiodarone with better controlled v rates. Continue amiodarone 200 mg a day. Continue eliquis Continue metoprolol  2. OSA Continue cpap  3. Obesity Weight loss advised  F/u with [name] as scheduled 11/28 Afib clinic as needed  Butch Penny C. Jenny Lai, [facility] [address] [place], [place] [address] [phone]

## 2015-05-27 ENCOUNTER — Other Ambulatory Visit: Payer: PRIVATE HEALTH INSURANCE

## 2015-05-27 ENCOUNTER — Ambulatory Visit (INDEPENDENT_AMBULATORY_CARE_PROVIDER_SITE_OTHER)
Admission: RE | Admit: 2015-05-27 | Discharge: 2015-05-27 | Disposition: A | Discharge status: Home / Self Care | Payer: 59 | Source: Ambulatory Visit | Attending: Pulmonary Disease | Admitting: Pulmonary Disease

## 2015-05-27 DIAGNOSIS — J849 Interstitial pulmonary disease, unspecified: Secondary | ICD-10-CM

## 2015-05-31 ENCOUNTER — Telehealth: Payer: Self-pay | Admitting: Pulmonary Disease

## 2015-05-31 NOTE — Telephone Encounter (Signed)
Dg Chest 2 View  05/20/2015   CLINICAL DATA:  54 year old male with shortness of breath and intermittent chest pain for 2 years. Initial encounter. Hypertension and atrial fibrillation.  EXAM: CHEST  2 VIEW  COMPARISON:  Chest CTA 01/07/2015 and earlier.  FINDINGS: Stable lung volumes with chronic increased AP dimension to the chest. Stable cardiac size and mediastinal contours. Visualized tracheal air column is within normal limits. Coarse bilateral pulmonary interstitial opacity has mildly progressed since 2013. No pneumothorax, pulmonary edema, pleural effusion or confluent pulmonary opacity. No acute osseous abnormality identified. Stable cholecystectomy clips.  IMPRESSION: No acute cardiopulmonary abnormality.   Electronically Signed   By: Genevie Ann M.D.   On: 05/20/2015 08:17   Ct Chest High Resolution  05/27/2015   CLINICAL DATA:  Shortness of breath for 2 years. Former smoker, quit 15 years prior. History of atrial fibrillation status post ablation.  EXAM: CT CHEST WITHOUT CONTRAST  TECHNIQUE: Multidetector CT imaging of the chest was performed following the standard protocol without intravenous contrast. High resolution imaging of the lungs, as well as inspiratory and expiratory imaging, was performed.  COMPARISON:  12/21/2014 chest CT angiogram.  FINDINGS: Mediastinum/Nodes: Normal heart size. No pericardial fluid/thickening. There is atherosclerosis of the thoracic aorta, the great vessels of the mediastinum and the coronary arteries, including calcified atherosclerotic plaque in the left main, left anterior descending, left circumflex and right coronary arteries. Great vessels are normal in course and caliber. Normal visualized thyroid. Normal esophagus. No axillary, mediastinal or gross hilar lymphadenopathy, noting limited sensitivity for the detection of hilar adenopathy on this noncontrast study.  Lungs/Pleura: No pneumothorax. No pleural effusion. There is a 3 mm solid pulmonary nodule in the  posterior lower right upper lobe (series 5/ image 23), stable since 01/07/2015. There is a 2 mm peripheral left upper lobe pulmonary nodule (5/12), stable since 01/07/2015. No acute consolidative airspace disease, new significant pulmonary nodules or lung masses. There are no significant regions of subpleural reticulation, ground-glass attenuation, traction bronchiectasis, parenchymal banding or frank honeycombing. There is no significant air trapping on the expiration sequence.  Upper abdomen: Unremarkable.  Musculoskeletal: No aggressive appearing focal osseous lesions. Mild-to-moderate degenerative changes in the thoracic spine.  IMPRESSION: 1. No evidence of interstitial lung disease. Specifically, no significant regions of subpleural reticulation, ground-glass attenuation, traction bronchiectasis or frank honeycombing. 2. No evidence of significant air trapping. 3. Two tiny pulmonary nodules, largest 3 mm in the right upper lobe, for which 4 month stability has been demonstrated. Follow-up chest CT at 1 year is recommended in this patient with a history of smoking. This recommendation follows the consensus statement: Guidelines for Management of Small Pulmonary Nodules Detected on CT Scans: A Statement from the Fleischner Society as published in Radiology 2005; 237:395-400. 4. Atherosclerosis, including left main and 3 vessel coronary artery disease. Please note that although the presence of coronary artery calcium documents the presence of coronary artery disease, the severity of this disease and any potential stenosis cannot be assessed on this non-gated CT examination. Assessment for potential risk factor modification, dietary therapy or pharmacologic therapy may be warranted, if clinically indicated.   Electronically Signed   By: Ilona Sorrel M.D.   On: 05/27/2015 16:20    CT chest 05/27/15 >> 3 mm nodule RUL, 2 mm nodule LUL, coronary atherosclerosis   Results d/w pt by phone.    Will ask Dr.  [name] with cardiology to review significance of coronary atherosclerosis findings.

## 2015-06-03 ENCOUNTER — Other Ambulatory Visit (HOSPITAL_COMMUNITY): Payer: Self-pay | Admitting: *Deleted

## 2015-06-03 ENCOUNTER — Telehealth (HOSPITAL_COMMUNITY): Payer: Self-pay | Admitting: *Deleted

## 2015-06-03 ENCOUNTER — Inpatient Hospital Stay (HOSPITAL_COMMUNITY)
Admission: RE | Admit: 2015-06-03 | Payer: PRIVATE HEALTH INSURANCE | Source: Ambulatory Visit | Admitting: Nurse Practitioner

## 2015-06-03 ENCOUNTER — Encounter (HOSPITAL_COMMUNITY): Payer: Self-pay

## 2015-06-03 ENCOUNTER — Ambulatory Visit (HOSPITAL_COMMUNITY): Admit: 2015-06-03 | Payer: Self-pay | Admitting: Internal Medicine

## 2015-06-03 SURGERY — CARDIOVERSION
Anesthesia: Monitor Anesthesia Care

## 2015-06-03 NOTE — Telephone Encounter (Signed)
Pt called in clearly fatigued on the phone stating has been in afib for last 5 days.  Pt to come in today for assessment and set up for cardioversion at 12pm today.  Patient instructed not to eat or drink anything until after procedure and will need driver after procedure.

## 2015-06-03 NOTE — Telephone Encounter (Signed)
Pt called in stating he had flipped back into NSR.  Cardioversion cancelled.

## 2015-06-05 ENCOUNTER — Other Ambulatory Visit: Payer: Self-pay | Admitting: Family Medicine

## 2015-06-07 ENCOUNTER — Telehealth: Payer: Self-pay | Admitting: Pulmonary Disease

## 2015-06-07 NOTE — Telephone Encounter (Signed)
ONO has been received and placed in VS look at folder. Please advise on results. thanks

## 2015-06-08 ENCOUNTER — Telehealth (HOSPITAL_COMMUNITY): Payer: Self-pay | Admitting: *Deleted

## 2015-06-08 ENCOUNTER — Ambulatory Visit: Payer: PRIVATE HEALTH INSURANCE | Admitting: Internal Medicine

## 2015-06-08 NOTE — Telephone Encounter (Signed)
Pt called in this morning frustrated and very fatigued. States staying in afib 4-5 days a week HR ranging from 80-105. Discussed with [name] and he recommended referral to Dr. Roxy Manns to discuss maze procedure if patient interested.  Discussed with patient he is agreeable to consultation with Dr. Roxy Manns regarding maze procedure. Referral placed in epic for patient and [name] will be in touch with Dr. Roxy Manns regarding patient's history later today.

## 2015-06-08 NOTE — Telephone Encounter (Signed)
ONO with CPAP 06/02/15 >> Test time 8 hrs 40 min.  Basal SpO2 91.6%, low SpO2 56% (likely artifact).  Spent 11.3 min with SpO2 < 88%, but most of this time likely artifactual.   Will have my nurse inform pt that ONO looked good.  No change to current CPAP set up.

## 2015-06-09 NOTE — Telephone Encounter (Signed)
Spoke with pt, aware of below results/recs.  Nothing further needed.

## 2015-06-14 ENCOUNTER — Encounter: Payer: Self-pay | Admitting: Thoracic Surgery (Cardiothoracic Vascular Surgery)

## 2015-06-14 ENCOUNTER — Institutional Professional Consult (permissible substitution) (INDEPENDENT_AMBULATORY_CARE_PROVIDER_SITE_OTHER): Payer: Managed Care, Other (non HMO) | Admitting: Thoracic Surgery (Cardiothoracic Vascular Surgery)

## 2015-06-14 VITALS — BP 147/96 | HR 76 | Resp 20 | Ht 68.0 in | Wt 287.0 lb

## 2015-06-14 DIAGNOSIS — I480 Paroxysmal atrial fibrillation: Secondary | ICD-10-CM | POA: Diagnosis not present

## 2015-06-14 DIAGNOSIS — E785 Hyperlipidemia, unspecified: Secondary | ICD-10-CM

## 2015-06-14 DIAGNOSIS — I481 Persistent atrial fibrillation: Secondary | ICD-10-CM | POA: Diagnosis not present

## 2015-06-14 DIAGNOSIS — I340 Nonrheumatic mitral (valve) insufficiency: Secondary | ICD-10-CM | POA: Insufficient documentation

## 2015-06-14 DIAGNOSIS — I4819 Other persistent atrial fibrillation: Secondary | ICD-10-CM

## 2015-06-14 DIAGNOSIS — I4891 Unspecified atrial fibrillation: Secondary | ICD-10-CM

## 2015-06-14 DIAGNOSIS — I5032 Chronic diastolic (congestive) heart failure: Secondary | ICD-10-CM | POA: Insufficient documentation

## 2015-06-14 NOTE — Progress Notes (Signed)
[address]       [place] [address]             [phone]     CARDIOTHORACIC SURGERY CONSULTATION REPORT  Referring Provider is [name], MD PCP is [name], MD  Chief Complaint  Patient presents with  . Atrial Fibrillation    surgical eval for MAZE     HPI:  Patient is a 54 year old morbidly obese male with history of hypertension, chronic diastolic congestive heart failure, recurrent paroxysmal atrial fibrillation, anxiety, and depression who has been referred for surgical consultation to discuss treatment options for management of atrial fibrillation refractory to medical therapy and previous catheter-based ablation 2. The patient first developed recurrent paroxysmal atrial fibrillation in 2014. Transthoracic echocardiogram performed at that time demonstrated normal left ventricular systolic function. Patient underwent an exercise treadmill stress test that was felt to be low risk. He was initially treated with metoprolol and anticoagulated using aspirin alone.  And he was referred for a sleep study and diagnosed with obstructive sleep apnea. He has been using CPAP ever since. He continued to suffer from recurrent episodes of paroxysmal atrial fibrillation that made him very symptomatic. He was referred to the atrial fibrillation clinic treated for a period of time with flecainide and later Multaq, but he was intolerant of both.  In 2015 he was hospitalized with acute exacerbation of chronic diastolic congestive heart failure associated with recurrent atrial fibrillation and epigastric chest pain. A stress my view exam was performed at that time that was felt to be low risk. He was loaded with Tikosyn but continued to experience recurrent episodes of paroxysmal atrial fibrillation that were associated with shortness of breath, diaphoresis, fatigue, and atypical chest pain.  He underwent catheter-based ablation for atrial fibrillation on 2 occasions  in February and again in May of this year, but each time he quickly developed recurrent episodes of paroxysmal atrial fibrillation.  He was last seen in follow-up by [name] on 04/18/2015. At that time Tikosyn was discontinued and the patient was started on amiodarone. The patient has continued to experience intermittent episodes of atrial fibrillation on amiodarone therapy, although reportedly his heart rate has been under better control and symptoms have been tolerated somewhat better.  The patient has now been referred for surgical consultation to discuss the possibility of maze procedure.  The patient is married and lives locally in [place] with his wife. He has 1 son who is 86 years old in college. The patient is currently working as a courier. He has a remote history of tobacco use although he quit smoking 15 years ago. He denies excessive alcohol consumption. The patient describes chronic symptoms of exertional shortness of breath, fatigue, and atypical chest discomfort that date back more than 2 years. Symptoms are dramatically worse when the patient is out of sinus rhythm, although the patient does experience some exertional shortness of breath even when he is in rhythm. The patient describes tightness across his chest that is typically worse in the mornings and is not necessarily associated with stress. He has mild lower extremity edema. He has palpitations when he is in atrial fibrillation. He has never had any dizzy spells or syncope. He has a chronic dry cough. He uses CPAP at night. He has been unsuccessful in any attempts associated with weight loss. He does not exercise on a regular basis.  Past Medical History  Diagnosis Date  . Anxiety   . Depression   .  GERD (gastroesophageal reflux disease)   . Gallstones   . PONV (postoperative nausea and vomiting)   . Paroxysmal atrial fibrillation (HCC)     a.  previously intolerant of Flecainide and Multaq b. PVI by Dr [name] 09/21/14  .  Obstructive sleep apnea      on CPAP  . Mitral regurgitation   . Essential hypertension 07/15/2013  . Diastolic CHF, chronic ([place]) 07/18/2014  . Morbid obesity ([facility]) 12/21/2014  . GERD 09/09/2008    Qualifier: Diagnosis of  By: Sherren Mocha MD, Jory Ee   . Hyperlipidemia   . Chronic diastolic (congestive) heart failure [facility])     Past Surgical History  Procedure Laterality Date  . Hand surgery    . Tonsillectomy    . Wisdom tooth extraction    . Cholecystectomy  08/05/2012    Procedure: LAPAROSCOPIC CHOLECYSTECTOMY WITH INTRAOPERATIVE CHOLANGIOGRAM;  Surgeon: [name], MD,FACS;  Location: [facility];  Service: General;  Laterality: N/A;  . Laparoscopic appendectomy N/A 02/13/2014    Procedure: APPENDECTOMY LAPAROSCOPIC;  Surgeon: [name], MD;  Location: [facility];  Service: General;  Laterality: N/A;  . Tee without cardioversion N/A 09/20/2014    Procedure: TRANSESOPHAGEAL ECHOCARDIOGRAM (TEE);  Surgeon: [name], MD;  Location: [facility];  Service: Cardiovascular;  Laterality: N/A;  . Ablation  09/21/14    PVI by Dr [name]  . Atrial fibrillation ablation N/A 09/21/2014    Procedure: ATRIAL FIBRILLATION ABLATION;  Surgeon: [name], MD;  Location: [facility] CATH LAB;  Service: Cardiovascular;  Laterality: N/A;  . Tee without cardioversion N/A 01/10/2015    Procedure: TRANSESOPHAGEAL ECHOCARDIOGRAM (TEE);  Surgeon: Sanda Klein, MD;  Location: [facility] ENDOSCOPY;  Service: Cardiovascular;  Laterality: N/A;  . Electrophysiologic study N/A 01/11/2015    Procedure: Atrial Fibrillation Ablation;  Surgeon: [name], MD;  Location: [facility] CV LAB;  Service: Cardiovascular;  Laterality: N/A;    Family History  Problem Relation Age of Onset  . Adopted: Yes  . Other      ADOPTED    Social History   Social History  . Marital Status: Married    Spouse Name: N/A  . Number of Children: 1  . Years of Education: N/A   Occupational History  . Courier    Social History Main Topics  .  Smoking status: Former Smoker -- 0.50 packs/day for 30 years    Types: Cigarettes    Quit date: 07/23/2006  . Smokeless tobacco: Never Used  . Alcohol Use: 4.2 - 8.4 oz/week    7-14 Standard drinks or equivalent per week     Comment: social  . Drug Use: No  . Sexual Activity: Not on file   Other Topics Concern  . Not on file   Social History Narrative   Pt works as a Secondary school teacher    Current Outpatient Prescriptions  Medication Sig Dispense Refill  . amiodarone (PACERONE) 200 MG tablet Take 1 tablet (200 mg total) by mouth 2 (two) times daily. 120 tablet 3  . apixaban (ELIQUIS) 5 MG TABS tablet Take 5 mg by mouth 2 (two) times daily.    . cholestyramine (QUESTRAN) 4 G packet TAKE 2 PACKETS DAILY 60 packet 0  . citalopram (CELEXA) 10 MG tablet TAKE 1 TABLET BY MOUTH ONCE DAILY 90 tablet 1  . clorazepate (TRANXENE) 7.5 MG tablet TAKE 1 TABLET BY MOUTH AT BEDTIME 30 tablet 5  . esomeprazole (NEXIUM) 40 MG capsule Take 40 mg by mouth daily at 12 noon.    Marland Kitchen  furosemide (LASIX) 40 MG tablet Take 1 tablet (40 mg total) by mouth daily. 30 tablet 6  . guaiFENesin 200 MG tablet Take 200 mg by mouth 2 (two) times daily as needed (for cold).    Marland Kitchen losartan (COZAAR) 50 MG tablet Take 1 tablet (50 mg total) by mouth daily. 90 tablet 6  . metoprolol succinate (TOPROL-XL) 25 MG 24 hr tablet Take 1 tablet (25 mg total) by mouth 2 (two) times daily. 60 tablet 1  . potassium chloride SA (K-DUR,KLOR-CON) 20 MEQ tablet Take 1 tablet (20 mEq total) by mouth daily. 30 tablet 6  . simvastatin (ZOCOR) 20 MG tablet Take 1 tablet (20 mg total) by mouth daily at 6 PM. 30 tablet 11   No current facility-administered medications for this visit.    Allergies  Allergen Reactions  . Flecainide Other (See Comments)    EXTREME SOB  . Multaq [Dronedarone] Shortness Of Breath  . Penicillins Hives      Review of Systems:   General:  normal appetite, decreased energy, + weight gain, no weight loss, no  fever  Cardiac:  + chest pain with exertion, + chest pain at rest, + SOB with exertion, NO resting SOB, NO PND, NO orthopnea, + palpitations, + arrhythmia, + atrial fibrillation, + LE edema, NO dizzy spells, no syncope  Respiratory:  + shortness of breath, no home oxygen, no productive cough, + dry cough, no bronchitis, + wheezing, no hemoptysis, no asthma, no pain with inspiration or cough, + sleep apnea, + CPAP at night  GI:   no difficulty swallowing, + reflux controlled on Nexium Rx, no frequent heartburn, no hiatal hernia, no abdominal pain, no constipation, no diarrhea, no hematochezia, no hematemesis, no melena  GU:   no dysuria,  no frequency, no urinary tract infection, no hematuria, no enlarged prostate, no kidney stones, no kidney disease  Vascular:  no pain suggestive of claudication, no pain in feet, no leg cramps, no varicose veins, no DVT, no non-healing foot ulcer  Neuro:   no stroke, no TIA's, no seizures, no headaches, no temporary blindness one eye,  no slurred speech, no peripheral neuropathy, no chronic pain, no instability of gait, no memory/cognitive dysfunction  Musculoskeletal: no arthritis, no joint swelling, no myalgias, no difficulty walking, normal mobility   Skin:   no rash, no itching, no skin infections, no pressure sores or ulcerations  Psych:   + anxiety, + depression, no nervousness, + unusual recent stress  Eyes:   no blurry vision, no floaters, no recent vision changes, + wears glasses or contacts  ENT:   no hearing loss, no loose or painful teeth, no dentures, last saw dentist May 2016  Hematologic:  no easy bruising, no abnormal bleeding, no clotting disorder, no frequent epistaxis  Endocrine:  no diabetes, does not check CBG's at home     Physical Exam:   BP 147/96 mmHg  Pulse 76  Resp 20  Ht 5\' 8"  (1.727 m)  Wt 287 lb (130.182 kg)  BMI 43.65 kg/m2  SpO2 97%  General:  Obese but o/w  well-appearing  HEENT:  Unremarkable   Neck:   no JVD, no bruits,  no adenopathy   Chest:   clear to auscultation, symmetrical breath sounds, no wheezes, no rhonchi   CV:   Irregular rate and rhythm, no murmur   Abdomen:  soft, non-tender, no masses   Extremities:  warm, well-perfused, pulses diminished but palpable, no LE edema  Rectal/GU  Deferred  Neuro:  Grossly non-focal and symmetrical throughout  Skin:   Clean and dry, no rashes, no breakdown   Diagnostic Tests:  MYOCARDIAL IMAGING WITH SPECT (REST AND PHARMACOLOGIC-STRESS)  GATED LEFT VENTRICULAR WALL MOTION STUDY  LEFT VENTRICULAR EJECTION FRACTION  TECHNIQUE: Standard myocardial SPECT imaging was performed after resting intravenous injection of 10 mCi Tc-40m sestamibi. Subsequently, intravenous infusion of Lexiscan was performed under the supervision of the Cardiology staff. At peak effect of the drug, 30 mCi Tc-72m sestamibi was injected intravenously and standard myocardial SPECT imaging was performed. Quantitative gated imaging was also performed to evaluate left ventricular wall motion, and estimate left ventricular ejection fraction.  COMPARISON: None.  FINDINGS: Stess data: Baseline EKG: SR Nonspecific ST changes. With infusion of Lexiscan there were no ST changes to suggest ischemia  Nuclear data: In the initial stress images there was mild thinning in the inferolateral wall (base) Otherwise normal perfusion. In the recovery images there was no significant change  On gating, LVEF was calculated at 51% with inferior hypokinesis.  ON review of the raw data, diaphragm and bowel activity underlie the inferior/inferoseptal wall.  IMPRESSION: Electrically negative for ischemia. Myoview scan with a small region of mild basal inferolateral thinning consistent with probable soft tissue attenuation, cannot r/o subendocardial scar. No significant ischemia  LVEF on gating calculated at 51% with inferior hypokinesis. Consider echo to further define wall  motion  Overall low risk scan   Electronically Signed  By: Dorris Carnes M.D.  On: 07/20/2014 14:57   Transthoracic Echocardiography  Patient:  Niguel, Moure MR #:    [id number] Study Date: 12/22/2014 Gender:   M Age:    61 Height:   175.3 cm Weight:   127 kg BSA:    2.55 m^2 Pt. Status: Room:    6E11C  Aram Beecham REFERRING  Isaiah Serge ADMITTING  Gevena Barre K SONOGRAPHER Diamond Nickel PERFORMING  [facility], Inpatient ATTENDING  Charlesetta Shanks [id number]  cc:  ------------------------------------------------------------------- LV EF: 65% -  70%  ------------------------------------------------------------------- Indications:   Dyspnea 786.09.  ------------------------------------------------------------------- History:  PMH: Anxiety. depression. Asthma. Obstructive sleep apnea. Atrial fibrillation.  ------------------------------------------------------------------- Study Conclusions  - Left ventricle: The cavity size was normal. There was severe focal basal hypertrophy. Systolic function was vigorous. The estimated ejection fraction was in the range of 65% to 70%. Wall motion was normal; there were no regional wall motion abnormalities. - Aortic valve: Poorly visualized. - Mitral valve: Poorly visualized. There was trivial regurgitation. - Left atrium: The atrium was mildly dilated. - Pulmonic valve: Poorly visualized.  Transthoracic echocardiography. M-mode, complete 2D, spectral Doppler, and color Doppler. Birthdate: Patient birthdate: 07/23/1961. Age: Patient is 54 yr old. Sex: Gender: male. BMI: 41.3 kg/m^2. Blood pressure:   120/78 Patient status: Inpatient. Study date: Study date: 12/22/2014. Study time: 01:32 PM. Location:  Bedside.  -------------------------------------------------------------------  ------------------------------------------------------------------- Left ventricle: The cavity size was normal. There was severe focal basal hypertrophy. Systolic function was vigorous. The estimated ejection fraction was in the range of 65% to 70%. Wall motion was normal; there were no regional wall motion abnormalities. Normal sinus rhythm was absent. The study was not technically sufficient to allow evaluation of LV diastolic dysfunction due to atrial fibrillation.  ------------------------------------------------------------------- Aortic valve: Poorly visualized. Doppler: Transvalvular velocity was within the normal range. There was no stenosis. There was no regurgitation.  ------------------------------------------------------------------- Aorta: Aortic root: The aortic root was normal in size.  ------------------------------------------------------------------- Mitral valve: Poorly visualized. Structurally normal valve. Mobility was not restricted. Doppler: Transvalvular  velocity was within the normal range. There was no evidence for stenosis. There was trivial regurgitation.  ------------------------------------------------------------------- Left atrium: The atrium was mildly dilated.  ------------------------------------------------------------------- Right ventricle: The cavity size was normal. Wall thickness was normal. Systolic function was normal.  ------------------------------------------------------------------- Pulmonic valve:  Poorly visualized. Structurally normal valve. Cusp separation was normal. Doppler: Transvalvular velocity was within the normal range. There was no evidence for stenosis. There was no regurgitation.  ------------------------------------------------------------------- Tricuspid valve:  Structurally normal valve.  Doppler: Transvalvular  velocity was within the normal range. There was no regurgitation.  ------------------------------------------------------------------- Pulmonary artery:  The main pulmonary artery was normal-sized. Systolic pressure was within the normal range.  ------------------------------------------------------------------- Right atrium: The atrium was normal in size.  ------------------------------------------------------------------- Pericardium: There was no pericardial effusion.  ------------------------------------------------------------------- Systemic veins: Inferior vena cava: The vessel was normal in size.  ------------------------------------------------------------------- Measurements  Left ventricle              Value    Reference LV ID, ED, PLAX chordal    (L)    42.2 mm   43 - 52 LV ID, ES, PLAX chordal         33  mm   23 - 38 LV fx shortening, PLAX chordal (L)    22  %   >=29 LV PW thickness, ED           14.9 mm   --------- IVS/LV PW ratio, ED      (H)    1.81     <=1.3  Ventricular septum            Value    Reference IVS thickness, ED            27  mm   ---------  Aorta                  Value    Reference Aortic root ID, ED            39  mm   ---------  Left atrium               Value    Reference LA ID, A-P, ES              47  mm   --------- LA ID/bsa, A-P              1.85 cm/m^2 <=2.2 LA volume, S               103  ml   --------- LA volume/bsa, S             40.5 ml/m^2 --------- LA volume, ES, 1-p A4C          116  ml   --------- LA volume/bsa, ES, 1-p A4C        45.6 ml/m^2 --------- LA volume, ES, 1-p A2C          87  ml   --------- LA volume/bsa, ES, 1-p A2C        34.2  ml/m^2 ---------  Right ventricle             Value    Reference RV s&', lateral, S            10.5 cm/s  ---------  Legend: (L) and (H) mark values outside specified reference range.  ------------------------------------------------------------------- Prepared and Electronically Authenticated by  Fransico Him, MD 2016-05-04T16:19:57    Transesophageal Echocardiography  Patient:  Sanjuana Kava A MR #:  [id number] Study Date: 01/10/2015 Gender:   M Age:    43 Height:   172.7 cm Weight:   127.3 kg BSA:    2.53 m^2 Pt. Status: Room:  ADMITTING  Sanda Klein, MD ATTENDING  Sanda Klein, MD ORDERING   Sanda Klein, MD PERFORMING  Sanda Klein, MD REFERRING  Sanda Klein, MD SONOGRAPHER Jimmy Reel, RDCS  cc:  ------------------------------------------------------------------- LV EF: 60% -  65%  ------------------------------------------------------------------- Indications:   Atrial fibrillation - 427.31.  ------------------------------------------------------------------- Study Conclusions  - Left ventricle: The cavity size was normal. There was moderate asymmetric hypertrophy of the septum. Systolic function was normal. The estimated ejection fraction was in the range of 60% to 65%. Wall motion was normal; there were no regional wall motion abnormalities. There was a reduced contribution of atrial contraction to ventricular filling, due to increased ventricular diastolic pressure or atrial contractile dysfunction. - Aortic valve: No evidence of vegetation. - Mitral valve: There was mild systolic anterior motion of the anterior leaflet. No evidence of vegetation. There was mild to moderate regurgitation directed posteriorly. - Left atrium: The atrium was moderately to severely dilated. No evidence of thrombus in the atrial cavity or appendage. There  was severecontinuous spontaneous echo contrast (&quot;smoke&quot;) in the appendage. Emptying velocity was severely reduced. - Right atrium: The atrium was dilated. No evidence of thrombus in the atrial cavity or appendage. - Atrial septum: Doppler showed a small left-to-right atrial level shunt, in the baseline state. There was an atrial septal aneurysm. - Tricuspid valve: No evidence of vegetation. - Pulmonic valve: No evidence of vegetation.  Diagnostic transesophageal echocardiography. 2D and color Doppler. Birthdate: Patient birthdate: [birth year]. Age: Patient is 54 yr old. Sex: Gender: male.  BMI: 42.7 kg/m^2. Blood pressure: 129/63 Patient status: Outpatient. Study date: Study date: 01/10/2015. Study time: 11:17 AM. Location: Endoscopy.  -------------------------------------------------------------------  ------------------------------------------------------------------- Left ventricle: The cavity size was normal. There was moderate asymmetric hypertrophy of the septum. Systolic function was normal. The estimated ejection fraction was in the range of 60% to 65%. Wall motion was normal; there were no regional wall motion abnormalities. There was a reduced contribution of atrial contraction to ventricular filling, due to increased ventricular diastolic pressure or atrial contractile dysfunction.  ------------------------------------------------------------------- Aortic valve:  Structurally normal valve.  Cusp separation was normal. No evidence of vegetation. Doppler: There was no regurgitation.  ------------------------------------------------------------------- Aorta: The aorta was normal, not dilated, and non-diseased.  ------------------------------------------------------------------- Mitral valve:  Structurally normal valve.  Leaflet separation was normal. There was mild systolic anterior motion of the anterior leaflet. There is no  LVOT gradient, but there is turbulent systolic flow. No evidence of vegetation. Doppler: There was mild to moderate regurgitation directed posteriorly.  ------------------------------------------------------------------- Left atrium: The atrium was moderately to severely dilated. No evidence of thrombus in the atrial cavity or appendage. There was severecontinuous spontaneous echo contrast (&quot;smoke&quot;) in the appendage. Emptying velocity was severely reduced.  ------------------------------------------------------------------- Atrial septum:  Doppler showed a small left-to-right atrial level shunt, in the baseline state. There is flow both through a PFO and through a tiny ASD in the fossa ovalis, likely a residual from transseptal puncture. There was an atrial septal aneurysm.  ------------------------------------------------------------------- Right ventricle: The cavity size was normal. Wall thickness was normal. Systolic function was normal.  ------------------------------------------------------------------- Pulmonic valve:  Structurally normal valve.  Cusp separation was normal. No evidence of vegetation.  ------------------------------------------------------------------- Tricuspid valve:  Structurally normal valve.  Leaflet separation was normal. No evidence of vegetation. Doppler: There was no regurgitation.  -------------------------------------------------------------------  Right atrium: The atrium was dilated. No evidence of thrombus in the atrial cavity or appendage.  ------------------------------------------------------------------- Pericardium: There was no pericardial effusion.  ------------------------------------------------------------------- Post procedure conclusions Ascending Aorta:  - The aorta was normal, not dilated, and non-diseased.  ------------------------------------------------------------------- Prepared and  Electronically Authenticated by  Sanda Klein, MD 2016-05-23T16:30:03       Atrial Fibrillation Ablation    Conclusion    Editor: [name], MD (Physician)     Expand All Collapse All  SURGEON: [name], MD  PREPROCEDURE DIAGNOSES: 1. Paroxysmal atrial fibrillation.  POSTPROCEDURE DIAGNOSES: 1. Paroxysmal atrial fibrillation.  PROCEDURES: 1. Comprehensive electrophysiologic study. 2. Coronary sinus pacing and recording. 3. Three-dimensional mapping of atrial fibrillation  4. Ablation of atrial fibrillation  5. Intracardiac echocardiography. 6. Transseptal puncture of an intact septum. 7. Rotational Angiography with processing at an independent workstation 8. Arrhythmia induction with pacing with isuprel infusion 9. Cardioversion  INTRODUCTION: SHRAGA CUSTARD is a 54 y.o. male with a history of paroxysmal atrial fibrillation who now presents for EP study and radiofrequency ablation. The patient reports initially being diagnosed with atrial fibrillation after presenting with symptomatic palpitations and fatgiue. The patient reports increasing frequency and duration of atrial fibrillation since that time. The patient has failed medical therapy with Phyllis Ginger. He underwent prior afib ablation but continues to have atrial fibrillation. The patient therefore presents today for repeat catheter ablation of atrial fibrillation.  DESCRIPTION OF PROCEDURE: Informed written consent was obtained, and the patient was brought to the electrophysiology lab in a fasting state. The patient was adequately sedated with intravenous medications as outlined in the anesthesia report. The patient's left and right groins were prepped and draped in the usual sterile fashion by the EP lab staff. Using a percutaneous Seldinger technique, two 7-French and one 11-French hemostasis sheaths were placed into the right common femoral vein.  3 Dimensional Rotational Angiography: A 5  french pigtail catheter was introduced through the right common femoral vein and advanced into the inferior venocava. 3 demential rotational angiography was then performed by power injection of 100cc of nonionic contrast. Reprocessing at an independent work station was then performed.  This demonstrated a large sized left atrium with 4 separate pulmonary veins which were also moderate in size. There were no anomalous veins or significant abnormalities. A 3 dimensional rendering of the left atrium was then merged using [employer] onto the [employer] system and registered with intracardiac echo (see below). The pigtail catheter was then removed.  Catheter Placement: A 7-French Biosense Webster Decapolar coronary sinus catheter was introduced through the right common femoral vein and advanced into the coronary sinus for recording and pacing from this location. A 6-French quadripolar Josephson catheter was introduced through the right common femoral vein and advanced into the right ventricle for recording and pacing. This catheter was then pulled back to the His bundle location.   Initial Measurements: The patient presented to the electrophysiology lab in sinus rhythm. His PR interval measured 160 msec with a QRS duration of 121 msec and a QT interval of 493 msec. The AH interval measured 62 msec and the HV interval measured 45 msec.   Intracardiac Echocardiography: A 10-French [employer] Webster AcuNav intracardiac echocardiography catheter was introduced through the left common femoral vein and advanced into the right atrium. Intracardiac echocardiography was performed of the left atrium, and a three-dimensional anatomical rendering of the left atrium was performed using CARTO sound technology. The patient was noted to have a large sized left atrium.  The interatrial septum was prominent but not aneurysmal. All 4 pulmonary veins were visualized and noted to have separate ostia.  The pulmonary veins were moderate in size. The left atrial appendage was visualized and did not reveal thrombus. There was no evidence of pulmonary vein stenosis.   Transseptal Puncture: The middle right common femoral vein sheath was exchanged for an 8.5 [place] SL2 transseptal sheath and transseptal access was achieved in a standard fashion using a Brockenbrough needle under biplane fluoroscopy with intracardiac echocardiography confirmation of the transseptal puncture. Once transseptal access had been achieved, heparin was administered intravenously and intra- arterially in order to maintain an ACT of greater than 300 seconds throughout the procedure.   3D Mapping and Ablation: The His bundle catheter was removed and in its place a 3.5 mm [employer] Thermocool ablation catheter was advanced into the right atrium. The transseptal sheath was pulled back into the IVC over a guidewire. The ablation catheter was advanced across the transseptal hole using the wire as a guide. The transseptal sheath was then re-advanced over the guidewire into the left atrium. A duodecapolar Biosense Webster circular mapping catheter was introduced through the transseptal sheath and positioned over the mouth of all 4 pulmonary veins. Three-dimensional electroanatomical mapping was performed using CARTO technology. This demonstrated electrical activity within the right superior and right inferior pulmonary veins at baseline. The left sided PVs were quiescent from the prior procedure. The patient underwent successful sequential electrical isolation and anatomical encircling of the right superior and inferior pulmonary veins using radiofrequency current with a circular mapping catheter as a guide using a WACA approach.   Measurements Following Ablation: Following ablation, Isuprel was infused up to 20 mcg/min with no inducible atrial fibrillation, atrial tachycardia, atrial flutter, or sustained  PACs. I Ventricular pacing was performed, which revealed midline decremental VA conduction with a VA Wenckebach cycle length of 534msec. Rapid atrial pacing was performed, which revealed an AV Wenckebach cycle length of 380 msec. Electroisolation was then again confirmed in all four pulmonary veins. Pacing was performed along the ablation line which confirmed entrance and exit block. With rapid atrial pacing at 300 msec with isuprel, the patient developed left atrial flutter which quickly degenerated into atrial fibrillation.    Cardioversion: The patient was then cardioverted to sinus rhythm with a single synchronized 360-J biphasic shock with cardioversion electrodes in the anterior-posterior thoracic configuration. He maintained sinus rhythm initially but then had recurence of afib with catheter manipulation within the left atrial, requiring repeat cardioversion with 360J biphasic. He returned to afib. A 3rd shock was also unsuccessful.  The procedure was therefore considered completed. All catheters were removed, and the sheaths were aspirated and flushed. The patient was transferred to the recovery area for sheath removal per protocol. EBL<23ml. A limited bedside transthoracic echocardiogram revealed no pericardial effusion. There were no early apparent complications.  CONCLUSIONS: 1. Sinus rhythm upon presentation.  2. Rotational Angiography reveals a large sized left atrium with four separate pulmonary veins without evidence of pulmonary vein stenosis. 3. Return of electrical activity within the right superior and right inferior pulmonary veins at baseline. The left sided PVs were quiescent from the prior procedure. The patient underwent successful sequential electrical isolation and anatomical encircling of the right superior and inferior pulmonary veins using radiofrequency current with a circular mapping catheter as a guide using a WACA approach.  4. Atrial fibrillation induced  on isuprel. Cardioverted 5. No early apparent complications.  Cardiac/Coronary CT  TECHNIQUE: The patient was scanned on a Philips 256 scanner.  FINDINGS: A 120 kV prospective scan was triggered in the descending thoracic aorta at 111 HU's. Gantry rotation speed was 270 msecs and collimation was .9 mm. No beta blockade or NTG was given. The 3D data set was reconstructed in 5% intervals of the 67-82 % of the R-R cycle. Diastolic phases were analyzed on a dedicated work station using MPR, MIP and VRT modes. The patient received 80 cc of contrast.  There are 4 pulmonary veins that drains normally into the left atrium (2 on the right and 2 on the left).  Pulmonary veins ostial sizes are as follows:  RUPV: 28 x 18 mm  RLPV: 22 x 21 mm  LUPV: 19 x 12 mm  LLPV: 19 x 16 mm  The left atrial appendage is large and has a filling defect in its most distal portion measuring 24 x 15 mm.  Left atrium is severely dilated measuring 58 mm in 3 chamber view, 44 cm2 in 4 chamber and 43 cm2 in 2 chamber view.  There is mild concentric left ventricular hypertrophy with severe basal septal hypertrophy.  Aorta: Normal caliber of the aortic root and aorta. No dissection.  Aortic Valve: Trileaflet, no calcifications.  Coronary Arteries: The study is suboptimal for coronary evaluation as performed without use of NTG.  There is normal coronary origin with right dominance.  LM is free of plaque.  LAD is a large vessel that has mild calcified plaque in its proximal segment with 25-50% stenosis.  Mid LAD after the takeoff of the large diagonal branch has moderate non-calcified plaque with associated 51-69% stenosis. D1 has diffuse calcified non-obstructive plaque.  LCX is medium caliber non-dominant vessel that has moderate predominantly calcified plaque in the proximal to mid segment with associated stenosis 59-69%.  RCA is a medium size  dominant vessel that gives rise to PDA and PLA. Proximal RCA has minimal diffuse calcified plaque. Mid RCA has mild diffuse calcified plaque with stenosis 25-50% stenosis. Distal RCA is affected by motion artifact and poor dilatation.  IMPRESSION: 1. Normal pulmonary vein drainage with no evidence of pulmonary vein stenosis.  2. Left atrium is severely dilated.  2. The left atrial appendage is large and has a filling defect in its most distal portion suspicious for a thrombus.  3. This study was suboptimal for coronary evaluation as no NTG was used, however there are diffuse calcifications in all three vessels and moderate disease in LAD and LCX. If clinically indicated a functional study is recommended for the evaluation of ischemia.  Ena Dawley   Electronically Signed  By: Ena Dawley  On: 01/07/2015 18:51    CT CHEST WITHOUT CONTRAST  TECHNIQUE: Multidetector CT imaging of the chest was performed following the standard protocol without intravenous contrast. High resolution imaging of the lungs, as well as inspiratory and expiratory imaging, was performed.  COMPARISON: 12/21/2014 chest CT angiogram.  FINDINGS: Mediastinum/Nodes: Normal heart size. No pericardial fluid/thickening. There is atherosclerosis of the thoracic aorta, the great vessels of the mediastinum and the coronary arteries, including calcified atherosclerotic plaque in the left main, left anterior descending, left circumflex and right coronary arteries. Great vessels are normal in course and caliber. Normal visualized thyroid. Normal esophagus. No axillary, mediastinal or gross hilar lymphadenopathy, noting limited sensitivity for the detection of hilar adenopathy on this noncontrast study.  Lungs/Pleura: No pneumothorax. No pleural effusion. There is a 3 mm solid pulmonary nodule in the posterior  lower right upper lobe (series 5/ image 23), stable since 01/07/2015. There  is a 2 mm peripheral left upper lobe pulmonary nodule (5/12), stable since 01/07/2015. No acute consolidative airspace disease, new significant pulmonary nodules or lung masses. There are no significant regions of subpleural reticulation, ground-glass attenuation, traction bronchiectasis, parenchymal banding or frank honeycombing. There is no significant air trapping on the expiration sequence.  Upper abdomen: Unremarkable.  Musculoskeletal: No aggressive appearing focal osseous lesions. Mild-to-moderate degenerative changes in the thoracic spine.  IMPRESSION: 1. No evidence of interstitial lung disease. Specifically, no significant regions of subpleural reticulation, ground-glass attenuation, traction bronchiectasis or frank honeycombing. 2. No evidence of significant air trapping. 3. Two tiny pulmonary nodules, largest 3 mm in the right upper lobe, for which 4 month stability has been demonstrated. Follow-up chest CT at 1 year is recommended in this patient with a history of smoking. This recommendation follows the consensus statement: Guidelines for Management of Small Pulmonary Nodules Detected on CT Scans: A Statement from the Fleischner Society as published in Radiology 2005; 237:395-400. 4. Atherosclerosis, including left main and 3 vessel coronary artery disease. Please note that although the presence of coronary artery calcium documents the presence of coronary artery disease, the severity of this disease and any potential stenosis cannot be assessed on this non-gated CT examination. Assessment for potential risk factor modification, dietary therapy or pharmacologic therapy may be warranted, if clinically indicated.   Electronically Signed  By: Ilona Sorrel M.D.  On: 05/27/2015 16:20   Impression:  Patient has long-standing history of chronic recurrent paroxysmal atrial fibrillation that has failed medical therapy with multiple drugs and catheter based  ablation on 2 previous occasions. He continues to experience acute exacerbations of exertional shortness of breath, atypical chest discomfort, and fatigue when he is out of sinus rhythm. Comorbid medical problems include long-standing hypertension with likely significant diastolic dysfunction and morbid obesity with obstructive sleep apnea.  I have personally reviewed the patient's most recent transthoracic and transesophageal echocardiograms, cardiac gated CTA of the heart, and high resolution CT scan of the chest.  The patient has preserved left ventricular systolic function with moderate left ventricular hypertrophy and moderate asymmetric septal hypertrophy without clear evidence for dynamic left ventricular outflow tract obstruction. The patient also has type I mitral valve dysfunction with moderate mitral regurgitation.  Cardiac gated CT angiogram of the heart and high resolution CT scan of the chest are both suggestive of the presence of significant left main and three-vessel coronary artery disease.  Previous exercise treadmill and nuclear stress tests have been felt to be relatively low risk for ischemia in the distant past.   Plan:  I have discussed treatment options for management of atrial fibrillation with the patient in the office this afternoon.   Prior to making a decision about whether or not the patient might benefit from a maze procedure, I recommend proceeding with left and right heart catheterization to definitively establish whether or not the patient has significant flow limiting coronary artery disease and/or pulmonary hypertension.   We will make arrangements for the patient to undergo catheterization as soon as practical. The patient will return in 2 weeks to review the results of his cath and discuss options further.   I spent in excess of 90 minutes during the conduct of this office consultation and >50% of this time involved direct face-to-face encounter with the patient for  counseling and/or coordination of their care.    Valentina Gu. Roxy Manns, MD 06/14/2015 4:10 PM

## 2015-06-14 NOTE — Patient Instructions (Signed)
Schedule heart catheterization as soon as practical  Continue all previous medications without any changes at this time

## 2015-06-15 ENCOUNTER — Telehealth: Payer: Self-pay | Admitting: Cardiology

## 2015-06-15 NOTE — Telephone Encounter (Signed)
I spoke with the pt and gave him pre-cath instructions for 06/20/15.

## 2015-06-15 NOTE — Telephone Encounter (Signed)
New Message    Pt calling stating that he spoke to a nurse earlier today about scheduling a cath and he isn't sure if it definitely got scheduled or when it is scheduled for. Please call back and advise.

## 2015-06-16 ENCOUNTER — Other Ambulatory Visit (INDEPENDENT_AMBULATORY_CARE_PROVIDER_SITE_OTHER): Payer: Managed Care, Other (non HMO) | Admitting: *Deleted

## 2015-06-16 ENCOUNTER — Other Ambulatory Visit: Payer: Self-pay | Admitting: *Deleted

## 2015-06-16 DIAGNOSIS — I4891 Unspecified atrial fibrillation: Secondary | ICD-10-CM

## 2015-06-16 DIAGNOSIS — Z01812 Encounter for preprocedural laboratory examination: Secondary | ICD-10-CM

## 2015-06-16 LAB — CBC WITH DIFFERENTIAL/PLATELET
BASOS ABS: 0 10*3/uL (ref 0.0–0.1)
Basophils Relative: 0 % (ref 0–1)
EOS ABS: 0.2 10*3/uL (ref 0.0–0.7)
EOS PCT: 2 % (ref 0–5)
HCT: 39 % (ref 39.0–52.0)
Hemoglobin: 13.5 g/dL (ref 13.0–17.0)
Lymphocytes Relative: 24 % (ref 12–46)
Lymphs Abs: 2.3 10*3/uL (ref 0.7–4.0)
MCH: 31.3 pg (ref 26.0–34.0)
MCHC: 34.6 g/dL (ref 30.0–36.0)
MCV: 90.5 fL (ref 78.0–100.0)
MPV: 10 fL (ref 8.6–12.4)
Monocytes Absolute: 1.1 10*3/uL — ABNORMAL HIGH (ref 0.1–1.0)
Monocytes Relative: 11 % (ref 3–12)
NEUTROS PCT: 63 % (ref 43–77)
Neutro Abs: 6.1 10*3/uL (ref 1.7–7.7)
PLATELETS: 249 10*3/uL (ref 150–400)
RBC: 4.31 MIL/uL (ref 4.22–5.81)
RDW: 13.9 % (ref 11.5–15.5)
WBC: 9.7 10*3/uL (ref 4.0–10.5)

## 2015-06-17 LAB — BASIC METABOLIC PANEL
BUN: 12 mg/dL (ref 7–25)
CO2: 25 mmol/L (ref 20–31)
CREATININE: 0.96 mg/dL (ref 0.70–1.33)
Calcium: 9.3 mg/dL (ref 8.6–10.3)
Chloride: 100 mmol/L (ref 98–110)
Glucose, Bld: 110 mg/dL — ABNORMAL HIGH (ref 65–99)
POTASSIUM: 3.7 mmol/L (ref 3.5–5.3)
Sodium: 138 mmol/L (ref 135–146)

## 2015-06-17 LAB — PROTIME-INR
INR: 0.97 (ref <1.50)
PROTHROMBIN TIME: 13 s (ref 11.6–15.2)

## 2015-06-20 ENCOUNTER — Ambulatory Visit (HOSPITAL_COMMUNITY)
Admission: RE | Admit: 2015-06-20 | Discharge: 2015-06-20 | Disposition: A | Discharge status: Home / Self Care | Payer: PRIVATE HEALTH INSURANCE | Source: Ambulatory Visit | Attending: Cardiovascular Disease | Admitting: Cardiovascular Disease

## 2015-06-20 ENCOUNTER — Encounter (HOSPITAL_COMMUNITY)
Admission: RE | Disposition: A | Discharge status: Home / Self Care | Payer: Self-pay | Source: Ambulatory Visit | Attending: Cardiovascular Disease

## 2015-06-20 ENCOUNTER — Encounter (HOSPITAL_COMMUNITY): Payer: Self-pay | Admitting: Cardiovascular Disease

## 2015-06-20 ENCOUNTER — Encounter: Payer: Self-pay | Admitting: Pulmonary Disease

## 2015-06-20 DIAGNOSIS — I110 Hypertensive heart disease with heart failure: Secondary | ICD-10-CM | POA: Diagnosis not present

## 2015-06-20 DIAGNOSIS — K219 Gastro-esophageal reflux disease without esophagitis: Secondary | ICD-10-CM | POA: Insufficient documentation

## 2015-06-20 DIAGNOSIS — J45909 Unspecified asthma, uncomplicated: Secondary | ICD-10-CM | POA: Diagnosis not present

## 2015-06-20 DIAGNOSIS — E785 Hyperlipidemia, unspecified: Secondary | ICD-10-CM | POA: Diagnosis not present

## 2015-06-20 DIAGNOSIS — I272 Other secondary pulmonary hypertension: Secondary | ICD-10-CM | POA: Diagnosis not present

## 2015-06-20 DIAGNOSIS — Z880 Allergy status to penicillin: Secondary | ICD-10-CM | POA: Insufficient documentation

## 2015-06-20 DIAGNOSIS — Z7901 Long term (current) use of anticoagulants: Secondary | ICD-10-CM | POA: Insufficient documentation

## 2015-06-20 DIAGNOSIS — I5032 Chronic diastolic (congestive) heart failure: Secondary | ICD-10-CM | POA: Insufficient documentation

## 2015-06-20 DIAGNOSIS — Z87891 Personal history of nicotine dependence: Secondary | ICD-10-CM | POA: Insufficient documentation

## 2015-06-20 DIAGNOSIS — G4733 Obstructive sleep apnea (adult) (pediatric): Secondary | ICD-10-CM | POA: Diagnosis not present

## 2015-06-20 DIAGNOSIS — Z6841 Body Mass Index (BMI) 40.0 and over, adult: Secondary | ICD-10-CM | POA: Diagnosis not present

## 2015-06-20 DIAGNOSIS — I340 Nonrheumatic mitral (valve) insufficiency: Secondary | ICD-10-CM | POA: Insufficient documentation

## 2015-06-20 DIAGNOSIS — I25110 Atherosclerotic heart disease of native coronary artery with unstable angina pectoris: Secondary | ICD-10-CM | POA: Insufficient documentation

## 2015-06-20 DIAGNOSIS — F419 Anxiety disorder, unspecified: Secondary | ICD-10-CM | POA: Insufficient documentation

## 2015-06-20 DIAGNOSIS — I200 Unstable angina: Secondary | ICD-10-CM | POA: Insufficient documentation

## 2015-06-20 DIAGNOSIS — I480 Paroxysmal atrial fibrillation: Secondary | ICD-10-CM | POA: Diagnosis not present

## 2015-06-20 DIAGNOSIS — I2510 Atherosclerotic heart disease of native coronary artery without angina pectoris: Secondary | ICD-10-CM | POA: Insufficient documentation

## 2015-06-20 DIAGNOSIS — F329 Major depressive disorder, single episode, unspecified: Secondary | ICD-10-CM | POA: Insufficient documentation

## 2015-06-20 HISTORY — PX: CARDIAC CATHETERIZATION: SHX172

## 2015-06-20 LAB — POCT I-STAT 3, VENOUS BLOOD GAS (G3P V)
BICARBONATE: 25 meq/L — AB (ref 20.0–24.0)
Bicarbonate: 26.2 mEq/L — ABNORMAL HIGH (ref 20.0–24.0)
O2 Saturation: 62 %
O2 Saturation: 98 %
PCO2 VEN: 40.6 mmHg — AB (ref 45.0–50.0)
PCO2 VEN: 47.6 mmHg (ref 45.0–50.0)
PH VEN: 7.397 — AB (ref 7.250–7.300)
PO2 VEN: 34 mmHg (ref 30.0–45.0)
TCO2: 26 mmol/L (ref 0–100)
TCO2: 28 mmol/L (ref 0–100)
pH, Ven: 7.349 — ABNORMAL HIGH (ref 7.250–7.300)
pO2, Ven: 99 mmHg — ABNORMAL HIGH (ref 30.0–45.0)

## 2015-06-20 SURGERY — RIGHT/LEFT HEART CATH AND CORONARY ANGIOGRAPHY

## 2015-06-20 MED ORDER — HEPARIN SODIUM (PORCINE) 1000 UNIT/ML IJ SOLN
INTRAMUSCULAR | Status: DC | PRN
  Administered 2015-06-20: 6000 [IU] via INTRAVENOUS

## 2015-06-20 MED ORDER — MIDAZOLAM HCL 2 MG/2ML IJ SOLN
INTRAMUSCULAR | Status: DC | PRN
  Administered 2015-06-20: 2 mg via INTRAVENOUS

## 2015-06-20 MED ORDER — VERAPAMIL HCL 2.5 MG/ML IV SOLN
INTRAVENOUS | Status: DC | PRN
Start: 2015-06-20 — End: 2015-06-20
  Administered 2015-06-20: 08:00:00 via INTRA_ARTERIAL

## 2015-06-20 MED ORDER — IOHEXOL 350 MG/ML SOLN
INTRAVENOUS | Status: DC | PRN
  Administered 2015-06-20: 95 mL via INTRACARDIAC

## 2015-06-20 MED ORDER — SODIUM CHLORIDE 0.9 % IJ SOLN: 3.0000 mL | INTRAMUSCULAR | Status: DC | PRN

## 2015-06-20 MED ORDER — SODIUM CHLORIDE 0.9 % IV SOLN: 250.0000 mL | INTRAVENOUS | Status: DC | PRN

## 2015-06-20 MED ORDER — SODIUM CHLORIDE 0.9 % WEIGHT BASED INFUSION: 1.0000 mL/kg/h | INTRAVENOUS | Status: DC

## 2015-06-20 MED ORDER — FENTANYL CITRATE (PF) 100 MCG/2ML IJ SOLN
INTRAMUSCULAR | Status: AC
  Filled 2015-06-20: qty 4

## 2015-06-20 MED ORDER — ONDANSETRON HCL 4 MG/2ML IJ SOLN
INTRAMUSCULAR | Status: DC | PRN
  Administered 2015-06-20: 4 mg via INTRAVENOUS

## 2015-06-20 MED ORDER — ONDANSETRON HCL 4 MG/2ML IJ SOLN
INTRAMUSCULAR | Status: AC
  Filled 2015-06-20: qty 2

## 2015-06-20 MED ORDER — SODIUM CHLORIDE 0.9 % WEIGHT BASED INFUSION
3.0000 mL/kg/h | INTRAVENOUS | Status: AC
  Administered 2015-06-20: 3 mL/kg/h via INTRAVENOUS

## 2015-06-20 MED ORDER — SODIUM CHLORIDE 0.9 % IJ SOLN: 3.0000 mL | Freq: Two times a day (BID) | INTRAMUSCULAR | Status: DC

## 2015-06-20 MED ORDER — HEPARIN SODIUM (PORCINE) 1000 UNIT/ML IJ SOLN
INTRAMUSCULAR | Status: AC
  Filled 2015-06-20: qty 1

## 2015-06-20 MED ORDER — ASPIRIN 81 MG PO CHEW
81.0000 mg | CHEWABLE_TABLET | Freq: Once | ORAL | Status: AC
  Administered 2015-06-20: 81 mg via ORAL

## 2015-06-20 MED ORDER — MIDAZOLAM HCL 2 MG/2ML IJ SOLN
INTRAMUSCULAR | Status: AC
  Filled 2015-06-20: qty 4

## 2015-06-20 MED ORDER — ASPIRIN 81 MG PO CHEW
CHEWABLE_TABLET | ORAL | Status: AC
  Filled 2015-06-20: qty 1

## 2015-06-20 MED ORDER — LIDOCAINE HCL (PF) 1 % IJ SOLN
INTRAMUSCULAR | Status: AC
  Filled 2015-06-20: qty 30

## 2015-06-20 MED ORDER — VERAPAMIL HCL 2.5 MG/ML IV SOLN
INTRAVENOUS | Status: AC
  Filled 2015-06-20: qty 2

## 2015-06-20 MED ORDER — HEPARIN (PORCINE) IN NACL 2-0.9 UNIT/ML-% IJ SOLN
INTRAMUSCULAR | Status: AC
  Filled 2015-06-20: qty 1000

## 2015-06-20 MED ORDER — ASPIRIN 81 MG PO CHEW: 81.0000 mg | CHEWABLE_TABLET | ORAL | Status: DC

## 2015-06-20 MED ORDER — FENTANYL CITRATE (PF) 100 MCG/2ML IJ SOLN
INTRAMUSCULAR | Status: DC | PRN
  Administered 2015-06-20: 50 ug via INTRAVENOUS

## 2015-06-20 MED ORDER — SODIUM CHLORIDE 0.9 % IV SOLN: INTRAVENOUS | Status: AC

## 2015-06-20 SURGICAL SUPPLY — 13 items
CATH BALLN WEDGE 5F 110CM (CATHETERS) ×3 IMPLANT
CATH INFINITI 5 FR JL3.5 (CATHETERS) ×3 IMPLANT
CATH INFINITI 5FR ANG PIGTAIL (CATHETERS) ×3 IMPLANT
CATH INFINITI JR4 5F (CATHETERS) ×3 IMPLANT
DEVICE RAD COMP TR BAND LRG (VASCULAR PRODUCTS) ×3 IMPLANT
GLIDESHEATH SLEND SS 6F .021 (SHEATH) ×3 IMPLANT
GUIDEWIRE .025 260CM (WIRE) ×3 IMPLANT
KIT HEART LEFT (KITS) ×3 IMPLANT
PACK CARDIAC CATHETERIZATION (CUSTOM PROCEDURE TRAY) ×3 IMPLANT
SHEATH FAST CATH BRACH 5F 5CM (SHEATH) ×3 IMPLANT
TRANSDUCER W/STOPCOCK (MISCELLANEOUS) ×3 IMPLANT
TUBING CIL FLEX 10 FLL-RA (TUBING) ×3 IMPLANT
WIRE SAFE-T 1.5MM-J .035X260CM (WIRE) ×3 IMPLANT

## 2015-06-20 NOTE — Interval H&P Note (Signed)
History and Physical Interval Note:  06/20/2015 7:28 AM  Harold Scott  has presented today for cardiac cath with the diagnosis of atrial fibrillation, upcoming MAZE procedure. The various methods of treatment have been discussed with the patient and family. After consideration of risks, benefits and other options for treatment, the patient has consented to  Procedure(s): Right/Left Heart Cath and Coronary Angiography (N/A) as a surgical intervention .  The patient's history has been reviewed, patient examined, no change in status, stable for surgery.  I have reviewed the patient's chart and labs.  Questions were answered to the patient's satisfaction.    Cath Lab Visit (complete for each Cath Lab visit)  Clinical Evaluation Leading to the Procedure:   ACS: No.  Non-ACS:    Anginal Classification: No Symptoms  Anti-ischemic medical therapy: Minimal Therapy (1 class of medications)  Non-Invasive Test Results: No non-invasive testing performed  Prior CABG: No previous CABG        Truitt Cruey

## 2015-06-20 NOTE — H&P (View-Only) (Signed)
Harold Scott       ,[place] [address]             [phone]     CARDIOTHORACIC SURGERY CONSULTATION REPORT  Referring Provider is [name], MD PCP is [name], MD  Chief Complaint  Patient presents with  . Atrial Fibrillation    surgical eval for MAZE     HPI:  Patient is a 54 year old morbidly obese male with history of hypertension, chronic diastolic congestive heart failure, recurrent paroxysmal atrial fibrillation, anxiety, and depression who has been referred for surgical consultation to discuss treatment options for management of atrial fibrillation refractory to medical therapy and previous catheter-based ablation 2. The patient first developed recurrent paroxysmal atrial fibrillation in 2014. Transthoracic echocardiogram performed at that time demonstrated normal left ventricular systolic function. Patient underwent an exercise treadmill stress test that was felt to be low risk. He was initially treated with metoprolol and anticoagulated using aspirin alone.  And he was referred for a sleep study and diagnosed with obstructive sleep apnea. He has been using CPAP ever since. He continued to suffer from recurrent episodes of paroxysmal atrial fibrillation that made him very symptomatic. He was referred to the atrial fibrillation clinic treated for a period of time with flecainide and later Multaq, but he was intolerant of both.  In 2015 he was hospitalized with acute exacerbation of chronic diastolic congestive heart failure associated with recurrent atrial fibrillation and epigastric chest pain. A stress my view exam was performed at that time that was felt to be low risk. He was loaded with Tikosyn but continued to experience recurrent episodes of paroxysmal atrial fibrillation that were associated with shortness of breath, diaphoresis, fatigue, and atypical chest pain.  He underwent catheter-based ablation for atrial fibrillation on 2 occasions  in February and again in May of this year, but each time he quickly developed recurrent episodes of paroxysmal atrial fibrillation.  He was last seen in follow-up by [name] on 04/18/2015. At that time Tikosyn was discontinued and the patient was started on amiodarone. The patient has continued to experience intermittent episodes of atrial fibrillation on amiodarone therapy, although reportedly his heart rate has been under better control and symptoms have been tolerated somewhat better.  The patient has now been referred for surgical consultation to discuss the possibility of maze procedure.  The patient is married and lives locally in [place] with his wife. He has 1 son who is 60 years old in college. The patient is currently working as a courier. He has a remote history of tobacco use although he quit smoking 15 years ago. He denies excessive alcohol consumption. The patient describes chronic symptoms of exertional shortness of breath, fatigue, and atypical chest discomfort that date back more than 2 years. Symptoms are dramatically worse when the patient is out of sinus rhythm, although the patient does experience some exertional shortness of breath even when he is in rhythm. The patient describes tightness across his chest that is typically worse in the mornings and is not necessarily associated with stress. He has mild lower extremity edema. He has palpitations when he is in atrial fibrillation. He has never had any dizzy spells or syncope. He has a chronic dry cough. He uses CPAP at night. He has been unsuccessful in any attempts associated with weight loss. He does not exercise on a regular basis.  Past Medical History  Diagnosis Date  . Anxiety   . Depression   .  GERD (gastroesophageal reflux disease)   . Gallstones   . PONV (postoperative nausea and vomiting)   . Paroxysmal atrial fibrillation (HCC)     a.  previously intolerant of Flecainide and Multaq b. PVI by Dr [name] 09/21/14  .  Obstructive sleep apnea      on CPAP  . Mitral regurgitation   . Essential hypertension 07/15/2013  . Diastolic CHF, chronic ([place]) 07/18/2014  . Morbid obesity ([place]) 12/21/2014  . GERD 09/09/2008    Qualifier: Diagnosis of  By: Harold Mocha MD, Harold Scott   . Hyperlipidemia   . Chronic diastolic (congestive) heart failure [facility])     Past Surgical History  Procedure Laterality Date  . Hand surgery    . Tonsillectomy    . Wisdom tooth extraction    . Cholecystectomy  08/05/2012    Procedure: LAPAROSCOPIC CHOLECYSTECTOMY WITH INTRAOPERATIVE CHOLANGIOGRAM;  Surgeon: [name], MD,FACS;  Location: [facility];  Service: General;  Laterality: N/A;  . Laparoscopic appendectomy N/A 02/13/2014    Procedure: APPENDECTOMY LAPAROSCOPIC;  Surgeon: [name], MD;  Location: [facility];  Service: General;  Laterality: N/A;  . Tee without cardioversion N/A 09/20/2014    Procedure: TRANSESOPHAGEAL ECHOCARDIOGRAM (TEE);  Surgeon: [name], MD;  Location: [facility];  Service: Cardiovascular;  Laterality: N/A;  . Ablation  09/21/14    PVI by Dr [name]  . Atrial fibrillation ablation N/A 09/21/2014    Procedure: ATRIAL FIBRILLATION ABLATION;  Surgeon: [name], MD;  Location: [facility] CATH LAB;  Service: Cardiovascular;  Laterality: N/A;  . Tee without cardioversion N/A 01/10/2015    Procedure: TRANSESOPHAGEAL ECHOCARDIOGRAM (TEE);  Surgeon: Sanda Klein, MD;  Location: [facility] ENDOSCOPY;  Service: Cardiovascular;  Laterality: N/A;  . Electrophysiologic study N/A 01/11/2015    Procedure: Atrial Fibrillation Ablation;  Surgeon: [name], MD;  Location: [facility] CV LAB;  Service: Cardiovascular;  Laterality: N/A;    Family History  Problem Relation Age of Onset  . Adopted: Yes  . Other      ADOPTED    Social History   Social History  . Marital Status: Married    Spouse Name: N/A  . Number of Children: 1  . Years of Education: N/A   Occupational History  . Courier    Social History Main Topics  .  Smoking status: Former Smoker -- 0.50 packs/day for 30 years    Types: Cigarettes    Quit date: 07/23/2006  . Smokeless tobacco: Never Used  . Alcohol Use: 4.2 - 8.4 oz/week    7-14 Standard drinks or equivalent per week     Comment: social  . Drug Use: No  . Sexual Activity: Not on file   Other Topics Concern  . Not on file   Social History Narrative   Pt works as a Secondary school teacher    Current Outpatient Prescriptions  Medication Sig Dispense Refill  . amiodarone (PACERONE) 200 MG tablet Take 1 tablet (200 mg total) by mouth 2 (two) times daily. 120 tablet 3  . apixaban (ELIQUIS) 5 MG TABS tablet Take 5 mg by mouth 2 (two) times daily.    . cholestyramine (QUESTRAN) 4 G packet TAKE 2 PACKETS DAILY 60 packet 0  . citalopram (CELEXA) 10 MG tablet TAKE 1 TABLET BY MOUTH ONCE DAILY 90 tablet 1  . clorazepate (TRANXENE) 7.5 MG tablet TAKE 1 TABLET BY MOUTH AT BEDTIME 30 tablet 5  . esomeprazole (NEXIUM) 40 MG capsule Take 40 mg by mouth daily at 12 noon.    Marland Kitchen  furosemide (LASIX) 40 MG tablet Take 1 tablet (40 mg total) by mouth daily. 30 tablet 6  . guaiFENesin 200 MG tablet Take 200 mg by mouth 2 (two) times daily as needed (for cold).    Marland Kitchen losartan (COZAAR) 50 MG tablet Take 1 tablet (50 mg total) by mouth daily. 90 tablet 6  . metoprolol succinate (TOPROL-XL) 25 MG 24 hr tablet Take 1 tablet (25 mg total) by mouth 2 (two) times daily. 60 tablet 1  . potassium chloride SA (K-DUR,KLOR-CON) 20 MEQ tablet Take 1 tablet (20 mEq total) by mouth daily. 30 tablet 6  . simvastatin (ZOCOR) 20 MG tablet Take 1 tablet (20 mg total) by mouth daily at 6 PM. 30 tablet 11   No current facility-administered medications for this visit.    Allergies  Allergen Reactions  . Flecainide Other (See Comments)    EXTREME SOB  . Multaq [Dronedarone] Shortness Of Breath  . Penicillins Hives      Review of Systems:   General:  normal appetite, decreased energy, + weight gain, no weight loss, no  fever  Cardiac:  + chest pain with exertion, + chest pain at rest, + SOB with exertion, NO resting SOB, NO PND, NO orthopnea, + palpitations, + arrhythmia, + atrial fibrillation, + LE edema, NO dizzy spells, no syncope  Respiratory:  + shortness of breath, no home oxygen, no productive cough, + dry cough, no bronchitis, + wheezing, no hemoptysis, no asthma, no pain with inspiration or cough, + sleep apnea, + CPAP at night  GI:   no difficulty swallowing, + reflux controlled on Nexium Rx, no frequent heartburn, no hiatal hernia, no abdominal pain, no constipation, no diarrhea, no hematochezia, no hematemesis, no melena  GU:   no dysuria,  no frequency, no urinary tract infection, no hematuria, no enlarged prostate, no kidney stones, no kidney disease  Vascular:  no pain suggestive of claudication, no pain in feet, no leg cramps, no varicose veins, no DVT, no non-healing foot ulcer  Neuro:   no stroke, no TIA's, no seizures, no headaches, no temporary blindness one eye,  no slurred speech, no peripheral neuropathy, no chronic pain, no instability of gait, no memory/cognitive dysfunction  Musculoskeletal: no arthritis, no joint swelling, no myalgias, no difficulty walking, normal mobility   Skin:   no rash, no itching, no skin infections, no pressure sores or ulcerations  Psych:   + anxiety, + depression, no nervousness, + unusual recent stress  Eyes:   no blurry vision, no floaters, no recent vision changes, + wears glasses or contacts  ENT:   no hearing loss, no loose or painful teeth, no dentures, last saw dentist May 2016  Hematologic:  no easy bruising, no abnormal bleeding, no clotting disorder, no frequent epistaxis  Endocrine:  no diabetes, does not check CBG's at home     Physical Exam:   BP 147/96 mmHg  Pulse 76  Resp 20  Ht 5\' 8"  (1.727 m)  Wt 287 lb (130.182 kg)  BMI 43.65 kg/m2  SpO2 97%  General:  Obese but o/w  well-appearing  HEENT:  Unremarkable   Neck:   no JVD, no bruits,  no adenopathy   Chest:   clear to auscultation, symmetrical breath sounds, no wheezes, no rhonchi   CV:   Irregular rate and rhythm, no murmur   Abdomen:  soft, non-tender, no masses   Extremities:  warm, well-perfused, pulses diminished but palpable, no LE edema  Rectal/GU  Deferred  Neuro:  Grossly non-focal and symmetrical throughout  Skin:   Clean and dry, no rashes, no breakdown   Diagnostic Tests:  MYOCARDIAL IMAGING WITH SPECT (REST AND PHARMACOLOGIC-STRESS)  GATED LEFT VENTRICULAR WALL MOTION STUDY  LEFT VENTRICULAR EJECTION FRACTION  TECHNIQUE: Standard myocardial SPECT imaging was performed after resting intravenous injection of 10 mCi Tc-61m sestamibi. Subsequently, intravenous infusion of Lexiscan was performed under the supervision of the Cardiology staff. At peak effect of the drug, 30 mCi Tc-56m sestamibi was injected intravenously and standard myocardial SPECT imaging was performed. Quantitative gated imaging was also performed to evaluate left ventricular wall motion, and estimate left ventricular ejection fraction.  COMPARISON: None.  FINDINGS: Stess data: Baseline EKG: SR Nonspecific ST changes. With infusion of Lexiscan there were no ST changes to suggest ischemia  Nuclear data: In the initial stress images there was mild thinning in the inferolateral wall (base) Otherwise normal perfusion. In the recovery images there was no significant change  On gating, LVEF was calculated at 51% with inferior hypokinesis.  ON review of the raw data, diaphragm and bowel activity underlie the inferior/inferoseptal wall.  IMPRESSION: Electrically negative for ischemia. Myoview scan with a small region of mild basal inferolateral thinning consistent with probable soft tissue attenuation, cannot r/o subendocardial scar. No significant ischemia  LVEF on gating calculated at 51% with inferior hypokinesis. Consider echo to further define wall  motion  Overall low risk scan   Electronically Signed  By: Dorris Carnes M.D.  On: 07/20/2014 14:57   Transthoracic Echocardiography  Patient:  Cullen, Lahaie MR #:    [id number] Study Date: 12/22/2014 Gender:   M Age:    41 Height:   175.3 cm Weight:   127 kg BSA:    2.55 m^2 Pt. Status: Room:    6E11C  Aram Beecham REFERRING  Isaiah Serge ADMITTING  Gevena Barre K SONOGRAPHER Diamond Nickel PERFORMING  [facility], Inpatient ATTENDING  Charlesetta Shanks [phone]  cc:  ------------------------------------------------------------------- LV EF: 65% -  70%  ------------------------------------------------------------------- Indications:   Dyspnea 786.09.  ------------------------------------------------------------------- History:  PMH: Anxiety. depression. Asthma. Obstructive sleep apnea. Atrial fibrillation.  ------------------------------------------------------------------- Study Conclusions  - Left ventricle: The cavity size was normal. There was severe focal basal hypertrophy. Systolic function was vigorous. The estimated ejection fraction was in the range of 65% to 70%. Wall motion was normal; there were no regional wall motion abnormalities. - Aortic valve: Poorly visualized. - Mitral valve: Poorly visualized. There was trivial regurgitation. - Left atrium: The atrium was mildly dilated. - Pulmonic valve: Poorly visualized.  Transthoracic echocardiography. M-mode, complete 2D, spectral Doppler, and color Doppler. Birthdate: Patient birthdate: 07/12/1961. Age: Patient is 54 yr old. Sex: Gender: male. BMI: 41.3 kg/m^2. Blood pressure:   120/78 Patient status: Inpatient. Study date: Study date: 12/22/2014. Study time: 01:32 PM. Location:  Bedside.  -------------------------------------------------------------------  ------------------------------------------------------------------- Left ventricle: The cavity size was normal. There was severe focal basal hypertrophy. Systolic function was vigorous. The estimated ejection fraction was in the range of 65% to 70%. Wall motion was normal; there were no regional wall motion abnormalities. Normal sinus rhythm was absent. The study was not technically sufficient to allow evaluation of LV diastolic dysfunction due to atrial fibrillation.  ------------------------------------------------------------------- Aortic valve: Poorly visualized. Doppler: Transvalvular velocity was within the normal range. There was no stenosis. There was no regurgitation.  ------------------------------------------------------------------- Aorta: Aortic root: The aortic root was normal in size.  ------------------------------------------------------------------- Mitral valve: Poorly visualized. Structurally normal valve. Mobility was not restricted. Doppler: Transvalvular  velocity was within the normal range. There was no evidence for stenosis. There was trivial regurgitation.  ------------------------------------------------------------------- Left atrium: The atrium was mildly dilated.  ------------------------------------------------------------------- Right ventricle: The cavity size was normal. Wall thickness was normal. Systolic function was normal.  ------------------------------------------------------------------- Pulmonic valve:  Poorly visualized. Structurally normal valve. Cusp separation was normal. Doppler: Transvalvular velocity was within the normal range. There was no evidence for stenosis. There was no regurgitation.  ------------------------------------------------------------------- Tricuspid valve:  Structurally normal valve.  Doppler: Transvalvular  velocity was within the normal range. There was no regurgitation.  ------------------------------------------------------------------- Pulmonary artery:  The main pulmonary artery was normal-sized. Systolic pressure was within the normal range.  ------------------------------------------------------------------- Right atrium: The atrium was normal in size.  ------------------------------------------------------------------- Pericardium: There was no pericardial effusion.  ------------------------------------------------------------------- Systemic veins: Inferior vena cava: The vessel was normal in size.  ------------------------------------------------------------------- Measurements  Left ventricle              Value    Reference LV ID, ED, PLAX chordal    (L)    42.2 mm   43 - 52 LV ID, ES, PLAX chordal         33  mm   23 - 38 LV fx shortening, PLAX chordal (L)    22  %   >=29 LV PW thickness, ED           14.9 mm   --------- IVS/LV PW ratio, ED      (H)    1.81     <=1.3  Ventricular septum            Value    Reference IVS thickness, ED            27  mm   ---------  Aorta                  Value    Reference Aortic root ID, ED            39  mm   ---------  Left atrium               Value    Reference LA ID, A-P, ES              47  mm   --------- LA ID/bsa, A-P              1.85 cm/m^2 <=2.2 LA volume, S               103  ml   --------- LA volume/bsa, S             40.5 ml/m^2 --------- LA volume, ES, 1-p A4C          116  ml   --------- LA volume/bsa, ES, 1-p A4C        45.6 ml/m^2 --------- LA volume, ES, 1-p A2C          87  ml   --------- LA volume/bsa, ES, 1-p A2C        34.2  ml/m^2 ---------  Right ventricle             Value    Reference RV s&', lateral, S            10.5 cm/s  ---------  Legend: (L) and (H) mark values outside specified reference range.  ------------------------------------------------------------------- Prepared and Electronically Authenticated by  Fransico Him, MD 2016-05-04T16:19:57    Transesophageal Echocardiography  Patient:  Sanjuana Kava A MR #:  [id number] Study Date: 01/10/2015 Gender:   M Age:    70 Height:   172.7 cm Weight:   127.3 kg BSA:    2.53 m^2 Pt. Status: Room:  ADMITTING  Sanda Klein, MD ATTENDING  Sanda Klein, MD ORDERING   Sanda Klein, MD PERFORMING  Sanda Klein, MD REFERRING  Sanda Klein, MD SONOGRAPHER Jimmy Reel, RDCS  cc:  ------------------------------------------------------------------- LV EF: 60% -  65%  ------------------------------------------------------------------- Indications:   Atrial fibrillation - 427.31.  ------------------------------------------------------------------- Study Conclusions  - Left ventricle: The cavity size was normal. There was moderate asymmetric hypertrophy of the septum. Systolic function was normal. The estimated ejection fraction was in the range of 60% to 65%. Wall motion was normal; there were no regional wall motion abnormalities. There was a reduced contribution of atrial contraction to ventricular filling, due to increased ventricular diastolic pressure or atrial contractile dysfunction. - Aortic valve: No evidence of vegetation. - Mitral valve: There was mild systolic anterior motion of the anterior leaflet. No evidence of vegetation. There was mild to moderate regurgitation directed posteriorly. - Left atrium: The atrium was moderately to severely dilated. No evidence of thrombus in the atrial cavity or appendage. There  was severecontinuous spontaneous echo contrast (&quot;smoke&quot;) in the appendage. Emptying velocity was severely reduced. - Right atrium: The atrium was dilated. No evidence of thrombus in the atrial cavity or appendage. - Atrial septum: Doppler showed a small left-to-right atrial level shunt, in the baseline state. There was an atrial septal aneurysm. - Tricuspid valve: No evidence of vegetation. - Pulmonic valve: No evidence of vegetation.  Diagnostic transesophageal echocardiography. 2D and color Doppler. Birthdate: Patient birthdate: Feb 07, 1961. Age: Patient is 54 yr old. Sex: Gender: male.  BMI: 42.7 kg/m^2. Blood pressure: 129/63 Patient status: Outpatient. Study date: Study date: 01/10/2015. Study time: 11:17 AM. Location: Endoscopy.  -------------------------------------------------------------------  ------------------------------------------------------------------- Left ventricle: The cavity size was normal. There was moderate asymmetric hypertrophy of the septum. Systolic function was normal. The estimated ejection fraction was in the range of 60% to 65%. Wall motion was normal; there were no regional wall motion abnormalities. There was a reduced contribution of atrial contraction to ventricular filling, due to increased ventricular diastolic pressure or atrial contractile dysfunction.  ------------------------------------------------------------------- Aortic valve:  Structurally normal valve.  Cusp separation was normal. No evidence of vegetation. Doppler: There was no regurgitation.  ------------------------------------------------------------------- Aorta: The aorta was normal, not dilated, and non-diseased.  ------------------------------------------------------------------- Mitral valve:  Structurally normal valve.  Leaflet separation was normal. There was mild systolic anterior motion of the anterior leaflet. There is no  LVOT gradient, but there is turbulent systolic flow. No evidence of vegetation. Doppler: There was mild to moderate regurgitation directed posteriorly.  ------------------------------------------------------------------- Left atrium: The atrium was moderately to severely dilated. No evidence of thrombus in the atrial cavity or appendage. There was severecontinuous spontaneous echo contrast (&quot;smoke&quot;) in the appendage. Emptying velocity was severely reduced.  ------------------------------------------------------------------- Atrial septum:  Doppler showed a small left-to-right atrial level shunt, in the baseline state. There is flow both through a PFO and through a tiny ASD in the fossa ovalis, likely a residual from transseptal puncture. There was an atrial septal aneurysm.  ------------------------------------------------------------------- Right ventricle: The cavity size was normal. Wall thickness was normal. Systolic function was normal.  ------------------------------------------------------------------- Pulmonic valve:  Structurally normal valve.  Cusp separation was normal. No evidence of vegetation.  ------------------------------------------------------------------- Tricuspid valve:  Structurally normal valve.  Leaflet separation was normal. No evidence of vegetation. Doppler: There was no regurgitation.  -------------------------------------------------------------------  Right atrium: The atrium was dilated. No evidence of thrombus in the atrial cavity or appendage.  ------------------------------------------------------------------- Pericardium: There was no pericardial effusion.  ------------------------------------------------------------------- Post procedure conclusions Ascending Aorta:  - The aorta was normal, not dilated, and non-diseased.  ------------------------------------------------------------------- Prepared and  Electronically Authenticated by  Sanda Klein, MD 2016-05-23T16:30:03       Atrial Fibrillation Ablation    Conclusion    Editor: [name], MD (Physician)     Expand All Collapse All  SURGEON: [name], MD  PREPROCEDURE DIAGNOSES: 1. Paroxysmal atrial fibrillation.  POSTPROCEDURE DIAGNOSES: 1. Paroxysmal atrial fibrillation.  PROCEDURES: 1. Comprehensive electrophysiologic study. 2. Coronary sinus pacing and recording. 3. Three-dimensional mapping of atrial fibrillation  4. Ablation of atrial fibrillation  5. Intracardiac echocardiography. 6. Transseptal puncture of an intact septum. 7. Rotational Angiography with processing at an independent workstation 8. Arrhythmia induction with pacing with isuprel infusion 9. Cardioversion  INTRODUCTION: JEPTHA HINNENKAMP is a 54 y.o. male with a history of paroxysmal atrial fibrillation who now presents for EP study and radiofrequency ablation. The patient reports initially being diagnosed with atrial fibrillation after presenting with symptomatic palpitations and fatgiue. The patient reports increasing frequency and duration of atrial fibrillation since that time. The patient has failed medical therapy with Phyllis Ginger. He underwent prior afib ablation but continues to have atrial fibrillation. The patient therefore presents today for repeat catheter ablation of atrial fibrillation.  DESCRIPTION OF PROCEDURE: Informed written consent was obtained, and the patient was brought to the electrophysiology lab in a fasting state. The patient was adequately sedated with intravenous medications as outlined in the anesthesia report. The patient's left and right groins were prepped and draped in the usual sterile fashion by the EP lab staff. Using a percutaneous Seldinger technique, two 7-French and one 11-French hemostasis sheaths were placed into the right common femoral vein.  3 Dimensional Rotational Angiography: A 5  french pigtail catheter was introduced through the right common femoral vein and advanced into the inferior venocava. 3 demential rotational angiography was then performed by power injection of 100cc of nonionic contrast. Reprocessing at an independent work station was then performed.  This demonstrated a large sized left atrium with 4 separate pulmonary veins which were also moderate in size. There were no anomalous veins or significant abnormalities. A 3 dimensional rendering of the left atrium was then merged using [employer] onto the [employer] system and registered with intracardiac echo (see below). The pigtail catheter was then removed.  Catheter Placement: A 7-French Biosense Webster Decapolar coronary sinus catheter was introduced through the right common femoral vein and advanced into the coronary sinus for recording and pacing from this location. A 6-French quadripolar Josephson catheter was introduced through the right common femoral vein and advanced into the right ventricle for recording and pacing. This catheter was then pulled back to the His bundle location.   Initial Measurements: The patient presented to the electrophysiology lab in sinus rhythm. His PR interval measured 160 msec with a QRS duration of 121 msec and a QT interval of 493 msec. The AH interval measured 62 msec and the HV interval measured 45 msec.   Intracardiac Echocardiography: A 10-French [employer] Webster AcuNav intracardiac echocardiography catheter was introduced through the left common femoral vein and advanced into the right atrium. Intracardiac echocardiography was performed of the left atrium, and a three-dimensional anatomical rendering of the left atrium was performed using CARTO sound technology. The patient was noted to have a large sized left atrium.  The interatrial septum was prominent but not aneurysmal. All 4 pulmonary veins were visualized and noted to have separate ostia.  The pulmonary veins were moderate in size. The left atrial appendage was visualized and did not reveal thrombus. There was no evidence of pulmonary vein stenosis.   Transseptal Puncture: The middle right common femoral vein sheath was exchanged for an 8.5 [place] SL2 transseptal sheath and transseptal access was achieved in a standard fashion using a Brockenbrough needle under biplane fluoroscopy with intracardiac echocardiography confirmation of the transseptal puncture. Once transseptal access had been achieved, heparin was administered intravenously and intra- arterially in order to maintain an ACT of greater than 300 seconds throughout the procedure.   3D Mapping and Ablation: The His bundle catheter was removed and in its place a 3.5 mm [employer] Thermocool ablation catheter was advanced into the right atrium. The transseptal sheath was pulled back into the IVC over a guidewire. The ablation catheter was advanced across the transseptal hole using the wire as a guide. The transseptal sheath was then re-advanced over the guidewire into the left atrium. A duodecapolar Biosense Webster circular mapping catheter was introduced through the transseptal sheath and positioned over the mouth of all 4 pulmonary veins. Three-dimensional electroanatomical mapping was performed using CARTO technology. This demonstrated electrical activity within the right superior and right inferior pulmonary veins at baseline. The left sided PVs were quiescent from the prior procedure. The patient underwent successful sequential electrical isolation and anatomical encircling of the right superior and inferior pulmonary veins using radiofrequency current with a circular mapping catheter as a guide using a WACA approach.   Measurements Following Ablation: Following ablation, Isuprel was infused up to 20 mcg/min with no inducible atrial fibrillation, atrial tachycardia, atrial flutter, or sustained  PACs. I Ventricular pacing was performed, which revealed midline decremental VA conduction with a VA Wenckebach cycle length of 553msec. Rapid atrial pacing was performed, which revealed an AV Wenckebach cycle length of 380 msec. Electroisolation was then again confirmed in all four pulmonary veins. Pacing was performed along the ablation line which confirmed entrance and exit block. With rapid atrial pacing at 300 msec with isuprel, the patient developed left atrial flutter which quickly degenerated into atrial fibrillation.    Cardioversion: The patient was then cardioverted to sinus rhythm with a single synchronized 360-J biphasic shock with cardioversion electrodes in the anterior-posterior thoracic configuration. He maintained sinus rhythm initially but then had recurence of afib with catheter manipulation within the left atrial, requiring repeat cardioversion with 360J biphasic. He returned to afib. A 3rd shock was also unsuccessful.  The procedure was therefore considered completed. All catheters were removed, and the sheaths were aspirated and flushed. The patient was transferred to the recovery area for sheath removal per protocol. EBL<41ml. A limited bedside transthoracic echocardiogram revealed no pericardial effusion. There were no early apparent complications.  CONCLUSIONS: 1. Sinus rhythm upon presentation.  2. Rotational Angiography reveals a large sized left atrium with four separate pulmonary veins without evidence of pulmonary vein stenosis. 3. Return of electrical activity within the right superior and right inferior pulmonary veins at baseline. The left sided PVs were quiescent from the prior procedure. The patient underwent successful sequential electrical isolation and anatomical encircling of the right superior and inferior pulmonary veins using radiofrequency current with a circular mapping catheter as a guide using a WACA approach.  4. Atrial fibrillation induced  on isuprel. Cardioverted 5. No early apparent complications.  Cardiac/Coronary CT  TECHNIQUE: The patient was scanned on a Philips 256 scanner.  FINDINGS: A 120 kV prospective scan was triggered in the descending thoracic aorta at 111 HU's. Gantry rotation speed was 270 msecs and collimation was .9 mm. No beta blockade or NTG was given. The 3D data set was reconstructed in 5% intervals of the 67-82 % of the R-R cycle. Diastolic phases were analyzed on a dedicated work station using MPR, MIP and VRT modes. The patient received 80 cc of contrast.  There are 4 pulmonary veins that drains normally into the left atrium (2 on the right and 2 on the left).  Pulmonary veins ostial sizes are as follows:  RUPV: 28 x 18 mm  RLPV: 22 x 21 mm  LUPV: 19 x 12 mm  LLPV: 19 x 16 mm  The left atrial appendage is large and has a filling defect in its most distal portion measuring 24 x 15 mm.  Left atrium is severely dilated measuring 58 mm in 3 chamber view, 44 cm2 in 4 chamber and 43 cm2 in 2 chamber view.  There is mild concentric left ventricular hypertrophy with severe basal septal hypertrophy.  Aorta: Normal caliber of the aortic root and aorta. No dissection.  Aortic Valve: Trileaflet, no calcifications.  Coronary Arteries: The study is suboptimal for coronary evaluation as performed without use of NTG.  There is normal coronary origin with right dominance.  LM is free of plaque.  LAD is a large vessel that has mild calcified plaque in its proximal segment with 25-50% stenosis.  Mid LAD after the takeoff of the large diagonal branch has moderate non-calcified plaque with associated 51-69% stenosis. D1 has diffuse calcified non-obstructive plaque.  LCX is medium caliber non-dominant vessel that has moderate predominantly calcified plaque in the proximal to mid segment with associated stenosis 59-69%.  RCA is a medium size  dominant vessel that gives rise to PDA and PLA. Proximal RCA has minimal diffuse calcified plaque. Mid RCA has mild diffuse calcified plaque with stenosis 25-50% stenosis. Distal RCA is affected by motion artifact and poor dilatation.  IMPRESSION: 1. Normal pulmonary vein drainage with no evidence of pulmonary vein stenosis.  2. Left atrium is severely dilated.  2. The left atrial appendage is large and has a filling defect in its most distal portion suspicious for a thrombus.  3. This study was suboptimal for coronary evaluation as no NTG was used, however there are diffuse calcifications in all three vessels and moderate disease in LAD and LCX. If clinically indicated a functional study is recommended for the evaluation of ischemia.  Harold Scott   Electronically Signed  By: Harold Scott  On: 01/07/2015 18:51    CT CHEST WITHOUT CONTRAST  TECHNIQUE: Multidetector CT imaging of the chest was performed following the standard protocol without intravenous contrast. High resolution imaging of the lungs, as well as inspiratory and expiratory imaging, was performed.  COMPARISON: 12/21/2014 chest CT angiogram.  FINDINGS: Mediastinum/Nodes: Normal heart size. No pericardial fluid/thickening. There is atherosclerosis of the thoracic aorta, the great vessels of the mediastinum and the coronary arteries, including calcified atherosclerotic plaque in the left main, left anterior descending, left circumflex and right coronary arteries. Great vessels are normal in course and caliber. Normal visualized thyroid. Normal esophagus. No axillary, mediastinal or gross hilar lymphadenopathy, noting limited sensitivity for the detection of hilar adenopathy on this noncontrast study.  Lungs/Pleura: No pneumothorax. No pleural effusion. There is a 3 mm solid pulmonary nodule in the posterior  lower right upper lobe (series 5/ image 23), stable since 01/07/2015. There  is a 2 mm peripheral left upper lobe pulmonary nodule (5/12), stable since 01/07/2015. No acute consolidative airspace disease, new significant pulmonary nodules or lung masses. There are no significant regions of subpleural reticulation, ground-glass attenuation, traction bronchiectasis, parenchymal banding or frank honeycombing. There is no significant air trapping on the expiration sequence.  Upper abdomen: Unremarkable.  Musculoskeletal: No aggressive appearing focal osseous lesions. Mild-to-moderate degenerative changes in the thoracic spine.  IMPRESSION: 1. No evidence of interstitial lung disease. Specifically, no significant regions of subpleural reticulation, ground-glass attenuation, traction bronchiectasis or frank honeycombing. 2. No evidence of significant air trapping. 3. Two tiny pulmonary nodules, largest 3 mm in the right upper lobe, for which 4 month stability has been demonstrated. Follow-up chest CT at 1 year is recommended in this patient with a history of smoking. This recommendation follows the consensus statement: Guidelines for Management of Small Pulmonary Nodules Detected on CT Scans: A Statement from the Fleischner Society as published in Radiology 2005; 237:395-400. 4. Atherosclerosis, including left main and 3 vessel coronary artery disease. Please note that although the presence of coronary artery calcium documents the presence of coronary artery disease, the severity of this disease and any potential stenosis cannot be assessed on this non-gated CT examination. Assessment for potential risk factor modification, dietary therapy or pharmacologic therapy may be warranted, if clinically indicated.   Electronically Signed  By: Ilona Sorrel M.D.  On: 05/27/2015 16:20   Impression:  Patient has long-standing history of chronic recurrent paroxysmal atrial fibrillation that has failed medical therapy with multiple drugs and catheter based  ablation on 2 previous occasions. He continues to experience acute exacerbations of exertional shortness of breath, atypical chest discomfort, and fatigue when he is out of sinus rhythm. Comorbid medical problems include long-standing hypertension with likely significant diastolic dysfunction and morbid obesity with obstructive sleep apnea.  I have personally reviewed the patient's most recent transthoracic and transesophageal echocardiograms, cardiac gated CTA of the heart, and high resolution CT scan of the chest.  The patient has preserved left ventricular systolic function with moderate left ventricular hypertrophy and moderate asymmetric septal hypertrophy without clear evidence for dynamic left ventricular outflow tract obstruction. The patient also has type I mitral valve dysfunction with moderate mitral regurgitation.  Cardiac gated CT angiogram of the heart and high resolution CT scan of the chest are both suggestive of the presence of significant left main and three-vessel coronary artery disease.  Previous exercise treadmill and nuclear stress tests have been felt to be relatively low risk for ischemia in the distant past.   Plan:  I have discussed treatment options for management of atrial fibrillation with the patient in the office this afternoon.   Prior to making a decision about whether or not the patient might benefit from a maze procedure, I recommend proceeding with left and right heart catheterization to definitively establish whether or not the patient has significant flow limiting coronary artery disease and/or pulmonary hypertension.   We will make arrangements for the patient to undergo catheterization as soon as practical. The patient will return in 2 weeks to review the results of his cath and discuss options further.   I spent in excess of 90 minutes during the conduct of this office consultation and >50% of this time involved direct face-to-face encounter with the patient for  counseling and/or coordination of their care.    Valentina Gu. Roxy Manns, MD 06/14/2015 4:10 PM

## 2015-06-20 NOTE — Discharge Instructions (Signed)
REsume Eliquis tomorrow. Start ASA 81 mg daily.    Radial Site Care Refer to this sheet in the next few weeks. These instructions provide you with information about caring for yourself after your procedure. Your health care provider may also give you more specific instructions. Your treatment has been planned according to current medical practices, but problems sometimes occur. Call your health care provider if you have any problems or questions after your procedure. WHAT TO EXPECT AFTER THE PROCEDURE After your procedure, it is typical to have the following:  Bruising at the radial site that usually fades within 1-2 weeks.  Blood collecting in the tissue (hematoma) that may be painful to the touch. It should usually decrease in size and tenderness within 1-2 weeks. HOME CARE INSTRUCTIONS  Take medicines only as directed by your health care provider.  You may shower 24-48 hours after the procedure or as directed by your health care provider. Remove the bandage (dressing) and gently wash the site with plain soap and water. Pat the area dry with a clean towel. Do not rub the site, because this may cause bleeding.  Do not take baths, swim, or use a hot tub until your health care provider approves.  Check your insertion site every day for redness, swelling, or drainage.  Do not apply powder or lotion to the site.  Do not flex or bend the affected arm for 24 hours or as directed by your health care provider.  Do not push or pull heavy objects with the affected arm for 24 hours or as directed by your health care provider.  Do not lift over 10 lb (4.5 kg) for 5 days after your procedure or as directed by your health care provider.  Ask your health care provider when it is okay to:  Return to work or school.  Resume usual physical activities or sports.  Resume sexual activity.  Do not drive home if you are discharged the same day as the procedure. Have someone else drive you.  You may  drive 24 hours after the procedure unless otherwise instructed by your health care provider.  Do not operate machinery or power tools for 24 hours after the procedure.  If your procedure was done as an outpatient procedure, which means that you went home the same day as your procedure, a responsible adult should be with you for the first 24 hours after you arrive home.  Keep all follow-up visits as directed by your health care provider. This is important. SEEK MEDICAL CARE IF:  You have a fever.  You have chills.  You have increased bleeding from the radial site. Hold pressure on the site. SEEK IMMEDIATE MEDICAL CARE IF:  You have unusual pain at the radial site.  You have redness, warmth, or swelling at the radial site.  You have drainage (other than a small amount of blood on the dressing) from the radial site.  The radial site is bleeding, and the bleeding does not stop after 30 minutes of holding steady pressure on the site.  Your arm or hand becomes pale, cool, tingly, or numb.   This information is not intended to replace advice given to you by your health care provider. Make sure you discuss any questions you have with your health care provider.   Document Released: 09/08/2010 Document Revised: 08/27/2014 Document Reviewed: 02/22/2014 Elsevier Interactive Patient Education [employer].

## 2015-06-23 ENCOUNTER — Ambulatory Visit (INDEPENDENT_AMBULATORY_CARE_PROVIDER_SITE_OTHER): Payer: PRIVATE HEALTH INSURANCE | Admitting: Thoracic Surgery (Cardiothoracic Vascular Surgery)

## 2015-06-23 ENCOUNTER — Encounter: Payer: Self-pay | Admitting: Thoracic Surgery (Cardiothoracic Vascular Surgery)

## 2015-06-23 VITALS — BP 142/94 | HR 67 | Resp 16 | Ht 68.0 in | Wt 287.0 lb

## 2015-06-23 DIAGNOSIS — I4819 Other persistent atrial fibrillation: Secondary | ICD-10-CM

## 2015-06-23 DIAGNOSIS — I481 Persistent atrial fibrillation: Secondary | ICD-10-CM | POA: Diagnosis not present

## 2015-06-23 DIAGNOSIS — I480 Paroxysmal atrial fibrillation: Secondary | ICD-10-CM

## 2015-06-23 DIAGNOSIS — I4891 Unspecified atrial fibrillation: Secondary | ICD-10-CM

## 2015-06-23 DIAGNOSIS — I340 Nonrheumatic mitral (valve) insufficiency: Secondary | ICD-10-CM | POA: Diagnosis not present

## 2015-06-23 DIAGNOSIS — I200 Unstable angina: Secondary | ICD-10-CM

## 2015-06-23 DIAGNOSIS — I2510 Atherosclerotic heart disease of native coronary artery without angina pectoris: Secondary | ICD-10-CM | POA: Diagnosis not present

## 2015-06-23 DIAGNOSIS — I25110 Atherosclerotic heart disease of native coronary artery with unstable angina pectoris: Secondary | ICD-10-CM

## 2015-06-23 NOTE — Progress Notes (Signed)
[address]       [place] [address]             [phone]     CARDIOTHORACIC SURGERY OFFICE NOTE  Referring Provider is [name], MD PCP is TODD,JEFFREY Zenia Resides, MD   HPI:  Patient returns to the office today for follow-up of chronic recurrent paroxysmal atrial fibrillation that has failed medical therapy with multiple drugs and catheter-based ablation on 2 previous occasions. He was initially seen in consultation on 06/14/2015.  The patient complains of chronic symptoms of exertional shortness of breath, fatigue, and atypical chest discomfort that have accelerated in frequency and severity and are typically dramatically worse when the patient is out of sinus rhythm.  Transthoracic and transesophageal echocardiograms demonstrate normal left ventricular systolic function with moderate left ventricular hypertrophy, moderate asymmetric septal hypertrophy, and moderate mitral regurgitation. Diagnostic cardiac catheterization performed last week demonstrates the presence of single vessel coronary artery disease with critical high-grade stenosis of the large first diagonal branch off of the left anterior descending coronary artery and at least 50% stenosis of the left anterior descending coronary artery. Catheterization was also notable for the presence of moderate pulmonary hypertension.  The patient returns to the office today to review the results of his catheterization and discuss treatment options further. He reports no new problems or complaints over the past week.   Current Outpatient Prescriptions  Medication Sig Dispense Refill  . amiodarone (PACERONE) 200 MG tablet Take 200 mg by mouth daily.    Marland Kitchen apixaban (ELIQUIS) 5 MG TABS tablet Take 5 mg by mouth 2 (two) times daily.    . cholestyramine (QUESTRAN) 4 G packet TAKE 2 PACKETS DAILY (Patient taking differently: Take 4 g by mouth daily) 60 packet 0  . citalopram (CELEXA) 10 MG tablet TAKE 1 TABLET BY MOUTH  ONCE DAILY (Patient taking differently: TAKE 10 MG BY MOUTH ONCE DAILY) 90 tablet 1  . clorazepate (TRANXENE) 7.5 MG tablet TAKE 1 TABLET BY MOUTH AT BEDTIME (Patient taking differently: TAKE 7.5 MG BY MOUTH AT BEDTIME) 30 tablet 5  . esomeprazole (NEXIUM) 40 MG capsule Take 40 mg by mouth daily at 12 noon.    . furosemide (LASIX) 40 MG tablet Take 1 tablet (40 mg total) by mouth daily. 30 tablet 6  . guaiFENesin 200 MG tablet Take 200 mg by mouth 2 (two) times daily as needed (for cold).    Marland Kitchen losartan (COZAAR) 50 MG tablet Take 1 tablet (50 mg total) by mouth daily. 90 tablet 6  . metoprolol succinate (TOPROL-XL) 25 MG 24 hr tablet Take 1 tablet (25 mg total) by mouth 2 (two) times daily. 60 tablet 1  . potassium chloride SA (K-DUR,KLOR-CON) 20 MEQ tablet Take 1 tablet (20 mEq total) by mouth daily. 30 tablet 6  . simvastatin (ZOCOR) 20 MG tablet Take 1 tablet (20 mg total) by mouth daily at 6 PM. 30 tablet 11   No current facility-administered medications for this visit.      Physical Exam:   BP 142/94 mmHg  Pulse 67  Resp 16  Ht 5\' 8"  (1.727 m)  Wt 287 lb (130.182 kg)  BMI 43.65 kg/m2  SpO2 97%  General:  Morbidly obese but well appearing  Chest:   Clear to auscultation  CV:   Irregular rate and rhythm  Incisions:  n/a  Abdomen:  Soft and nontender  Extremities:  Warm and well-perfused  Diagnostic Tests:  CARDIAC CATHETERIZATION  Procedures  Right/Left Heart Cath and Coronary Angiography    Conclusion     Mid RCA lesion, 30% stenosed.  Mid Cx lesion, 30% stenosed.  1st Mrg lesion, 20% stenosed.  1st Diag lesion, 99% stenosed.  2nd Diag lesion, 60% stenosed.  Prox LAD to Mid LAD lesion, 30% stenosed.  Mid LAD lesion, 50% stenosed.  1. Severe stenosis moderate caliber Diagonal branch 2. Moderate stenosis mid LAD 3. Mild stenosis RCA 4. Mild stenosis Circumflex/OM 5. Elevated filling pressures  Recommendations: Will review images with Dr. Roxy Manns. He is  planning a MAZE procedure. The Diagonal lesion could be easily treated with PCI/stenting but timing will be based around timing of surgical procedure. Will start ASA 81 mg daily. Resume Eliquis tomorrow. D/C home today.      Indications    Coronary artery disease involving native coronary artery of native heart with unstable angina pectoris ([place]) [I25.110 (ICD-10-CM)]    Technique and Indications    Estimated blood loss <50 mL. Indication: Atrial fibrillation s/p two failed ablations and chest pain c/w unstable angina, CAD noted by CT chest. Pt referred for cath prior to MAZE procedure.   Procedure: The risks, benefits, complications, treatment options, and expected outcomes were discussed with the patient. The patient and/or family concurred with the proposed plan, giving informed consent. The patient was brought to the cath lab after IV hydration was begun and oral premedication was given. The patient was further sedated with Versed and Fentanyl. There was an IV catheter present in the right antecubital vein. This area was prepped and draped. I traded this catheter out over a wire for a 5 Fr Sheath. Right heart catheterization performed with a balloon tipped catheter. The right wrist was assessed with a modified Allens test which was positive. The right wrist was prepped and draped in a sterile fashion. 1% lidocaine was used for local anesthesia. Using the modified Seldinger access technique, a 5 French sheath was placed in the right radial artery. 3 mg Verapamil was given through the sheath. 6000 units IV heparin was given. Standard diagnostic catheters were used to perform selective coronary angiography. A pigtail catheter was used to cross the aortic valve into the LV. Pressures measured but no LV gram performed. The sheath was removed from the right radial artery and a Terumo hemostasis band was applied at the arteriotomy site on the right wrist.   No complications.    Coronary Findings      Dominance: Right   Left Anterior Descending   . Prox LAD to Mid LAD lesion, 30% stenosed. diffuse.   . Mid LAD lesion, 50% stenosed. discrete.   . First Diagonal Branch   The vessel is moderate in size.   . 1st Diag lesion, 99% stenosed. discrete.   . Second Diagonal Branch   The vessel is small in size.   . 2nd Diag lesion, 60% stenosed. discrete.     Left Circumflex  . Vessel is large.   . Mid Cx lesion, 30% stenosed. diffuse.   . First Obtuse Marginal Branch   The vessel is moderate in size.   . 1st Mrg lesion, 20% stenosed. diffuse.   . Second Obtuse Marginal Branch   The vessel is small in size.     Right Coronary Artery  . Vessel is large.   . Mid RCA lesion, 30% stenosed. discrete.       Right Heart Pressures Hemodynamic findings consistent with moderate pulmonary hypertension. Elevated LV EDP consistent with volume overload.  Coronary Diagrams    Diagnostic Diagram            Implants    Name ID Temporary Type Supply   No information to display    PACS Images    Show images for Cardiac catheterization     Link to Procedure Log    Procedure Log      Hemo Data       Most Recent Value   Fick Cardiac Output  4.77 L/min   Fick Cardiac Output Index  2.01 (L/min)/BSA   RA A Wave  20 mmHg   RA V Wave  19 mmHg   RA Mean  18 mmHg   RV Systolic Pressure  55 mmHg   RV Diastolic Pressure  12 mmHg   RV EDP  20 mmHg   PA Systolic Pressure  54 mmHg   PA Diastolic Pressure  27 mmHg   PA Mean  39 mmHg   PW A Wave  34 mmHg   PW V Wave  46 mmHg   PW Mean  32 mmHg   AO Systolic Pressure  532 mmHg   AO Diastolic Pressure  84 mmHg   AO Mean  992 mmHg   LV Systolic Pressure  426 mmHg   LV Diastolic Pressure  17 mmHg   LV EDP  26 mmHg   Arterial Occlusion Pressure Extended Systolic Pressure  834 mmHg   Arterial Occlusion Pressure Extended Diastolic Pressure  77 mmHg   Arterial Occlusion Pressure Extended Mean  Pressure  98 mmHg   Left Ventricular Apex Extended Systolic Pressure  196 mmHg   Left Ventricular Apex Extended Diastolic Pressure  13 mmHg   Left Ventricular Apex Extended EDP Pressure  28 mmHg   QP/QS  1   TPVR Index  19.39 HRUI   TSVR Index  52.67 HRUI   PVR SVR Ratio  0.08   TPVR/TSVR Ratio  0.37      Impression:  Patient has long-standing history of chronic recurrent paroxysmal atrial fibrillation that has failed medical therapy with multiple drugs and catheter based ablation on 2 previous occasions. He continues to experience acute exacerbations of exertional shortness of breath, atypical chest discomfort, and fatigue when he is out of sinus rhythm. Comorbid medical problems include long-standing hypertension with likely significant diastolic dysfunction and morbid obesity with obstructive sleep apnea. I have personally reviewed the patient's most recent transthoracic and transesophageal echocardiograms, cardiac gated CTA of the heart, and high resolution CT scan of the chest. The patient has preserved left ventricular systolic function with moderate left ventricular hypertrophy and moderate asymmetric septal hypertrophy without clear evidence for dynamic left ventricular outflow tract obstruction. The patient also has type I mitral valve dysfunction with moderate mitral regurgitation. Cardiac gated CT angiogram of the heart and high resolution CT scan of the chest are both suggestive of the presence of significant left main and three-vessel coronary artery disease.  Diagnostic cardiac catheterization performed last week demonstrates the presence of single vessel coronary artery disease with critical high-grade stenosis of the large first diagonal branch off of the left anterior descending coronary artery and at least 50% stenosis of the left anterior descending coronary artery. Catheterization was also notable for the presence of moderate pulmonary hypertension.  Options include  continued medical therapy without any type of intervention versus proceeding with coronary revascularization and surgical Maze procedure.    Plan:  I have discussed options at length with the patient in the office this afternoon. We discussed the  possibility of continued medical therapy indefinitely versus PCI and stenting of the diagonal branch followed by delayed maze procedure via right minithoracotomy approach versus proceeding with combined coronary artery bypass grafting and Maze procedure via conventional median sternotomy.  The relative risks and benefits of each of these procedures has been discussed in detail. The patient desires to proceed with combined surgical procedure including coronary artery bypass grafting and Maze procedure the conventional median sternotomy as soon as practical. At the time of surgery we will reassess the severity of mitral regurgitation and consider concomitant ring annuloplasty if necessary. The patient understands and accepts all potential associated risks of surgery including but not limited to risk of death, stroke or other neurologic complication, myocardial infarction, congestive heart failure, respiratory failure, renal failure, bleeding requiring blood transfusion and/or reexploration, aortic dissection or other major vascular complication, arrhythmia, heart block or bradycardia requiring permanent pacemaker, pneumonia, pleural effusion, wound infection, pulmonary embolus or other thromboembolic complication, chronic pain or other delayed complications related to median sternotomy, or the late recurrence of symptomatic ischemic heart disease and/or congestive heart failure.  The relative risks and benefits of performing a maze procedure was discussed at length, including the expected likelihood of long term freedom from recurrent symptomatic atrial fibrillation and/or atrial flutter.  The importance of long term risk modification have been emphasized.  All  questions answered.   I spent in excess of 30 minutes during the conduct of this office consultation and >50% of this time involved direct face-to-face encounter with the patient for counseling and/or coordination of their care.   Valentina Gu. Roxy Manns, MD 06/23/2015 4:24 PM

## 2015-06-23 NOTE — Patient Instructions (Signed)
Patient has been instructed to stop taking Eliquis  Patient should continue taking all other medications without change through the day before surgery.  Patient should have nothing to eat or drink after midnight the night before surgery.  On the morning of surgery patient should take only Toprol XL with a sip of water.

## 2015-06-24 ENCOUNTER — Other Ambulatory Visit: Payer: Self-pay | Admitting: *Deleted

## 2015-06-24 DIAGNOSIS — I2510 Atherosclerotic heart disease of native coronary artery without angina pectoris: Secondary | ICD-10-CM

## 2015-06-24 DIAGNOSIS — I4891 Unspecified atrial fibrillation: Secondary | ICD-10-CM

## 2015-06-24 DIAGNOSIS — I340 Nonrheumatic mitral (valve) insufficiency: Secondary | ICD-10-CM

## 2015-06-27 ENCOUNTER — Ambulatory Visit: Payer: PRIVATE HEALTH INSURANCE | Admitting: Thoracic Surgery (Cardiothoracic Vascular Surgery)

## 2015-06-28 ENCOUNTER — Encounter (HOSPITAL_COMMUNITY)
Admission: RE | Admit: 2015-06-28 | Discharge: 2015-06-28 | Disposition: A | Discharge status: Home / Self Care | Payer: 59 | Source: Ambulatory Visit | Attending: Thoracic Surgery (Cardiothoracic Vascular Surgery) | Admitting: Thoracic Surgery (Cardiothoracic Vascular Surgery)

## 2015-06-28 ENCOUNTER — Encounter (HOSPITAL_COMMUNITY): Payer: Self-pay

## 2015-06-28 ENCOUNTER — Ambulatory Visit: Payer: PRIVATE HEALTH INSURANCE | Admitting: Thoracic Surgery (Cardiothoracic Vascular Surgery)

## 2015-06-28 ENCOUNTER — Ambulatory Visit (HOSPITAL_BASED_OUTPATIENT_CLINIC_OR_DEPARTMENT_OTHER)
Admission: RE | Admit: 2015-06-28 | Discharge: 2015-06-28 | Disposition: A | Discharge status: Home / Self Care | Payer: 59 | Source: Ambulatory Visit | Attending: Thoracic Surgery (Cardiothoracic Vascular Surgery) | Admitting: Thoracic Surgery (Cardiothoracic Vascular Surgery)

## 2015-06-28 VITALS — BP 145/69 | HR 53 | Temp 98.4°F | Resp 18 | Ht 68.0 in | Wt 292.0 lb

## 2015-06-28 DIAGNOSIS — I340 Nonrheumatic mitral (valve) insufficiency: Secondary | ICD-10-CM

## 2015-06-28 DIAGNOSIS — I4891 Unspecified atrial fibrillation: Secondary | ICD-10-CM | POA: Diagnosis not present

## 2015-06-28 DIAGNOSIS — I2510 Atherosclerotic heart disease of native coronary artery without angina pectoris: Secondary | ICD-10-CM

## 2015-06-28 HISTORY — DX: Unspecified osteoarthritis, unspecified site: M19.90

## 2015-06-28 HISTORY — DX: Personal history of other diseases of the digestive system: Z87.19

## 2015-06-28 LAB — BLOOD GAS, ARTERIAL
ACID-BASE EXCESS: 1.2 mmol/L (ref 0.0–2.0)
Bicarbonate: 25.1 mEq/L — ABNORMAL HIGH (ref 20.0–24.0)
Drawn by: 206361
FIO2: 0.21
O2 Saturation: 95.8 %
PCO2 ART: 38.4 mmHg (ref 35.0–45.0)
PH ART: 7.43 (ref 7.350–7.450)
PO2 ART: 83.7 mmHg (ref 80.0–100.0)
Patient temperature: 98.6
TCO2: 26.3 mmol/L (ref 0–100)

## 2015-06-28 LAB — CBC
HEMATOCRIT: 38.9 % — AB (ref 39.0–52.0)
HEMOGLOBIN: 12.6 g/dL — AB (ref 13.0–17.0)
MCH: 30.5 pg (ref 26.0–34.0)
MCHC: 32.4 g/dL (ref 30.0–36.0)
MCV: 94.2 fL (ref 78.0–100.0)
PLATELETS: 236 10*3/uL (ref 150–400)
RBC: 4.13 MIL/uL — AB (ref 4.22–5.81)
RDW: 13.6 % (ref 11.5–15.5)
WBC: 8.7 10*3/uL (ref 4.0–10.5)

## 2015-06-28 LAB — COMPREHENSIVE METABOLIC PANEL
ALK PHOS: 75 U/L (ref 38–126)
ALT: 54 U/L (ref 17–63)
AST: 49 U/L — ABNORMAL HIGH (ref 15–41)
Albumin: 4 g/dL (ref 3.5–5.0)
Anion gap: 11 (ref 5–15)
BILIRUBIN TOTAL: 0.8 mg/dL (ref 0.3–1.2)
BUN: 11 mg/dL (ref 6–20)
CALCIUM: 9.4 mg/dL (ref 8.9–10.3)
CO2: 26 mmol/L (ref 22–32)
CREATININE: 0.94 mg/dL (ref 0.61–1.24)
Chloride: 100 mmol/L — ABNORMAL LOW (ref 101–111)
Glucose, Bld: 113 mg/dL — ABNORMAL HIGH (ref 65–99)
Potassium: 4.8 mmol/L (ref 3.5–5.1)
Sodium: 137 mmol/L (ref 135–145)
TOTAL PROTEIN: 6.7 g/dL (ref 6.5–8.1)

## 2015-06-28 LAB — URINALYSIS, ROUTINE W REFLEX MICROSCOPIC
Bilirubin Urine: NEGATIVE
Glucose, UA: NEGATIVE mg/dL
KETONES UR: NEGATIVE mg/dL
LEUKOCYTES UA: NEGATIVE
NITRITE: NEGATIVE
PH: 6 (ref 5.0–8.0)
Protein, ur: NEGATIVE mg/dL
SPECIFIC GRAVITY, URINE: 1.021 (ref 1.005–1.030)
UROBILINOGEN UA: 0.2 mg/dL (ref 0.0–1.0)

## 2015-06-28 LAB — URINE MICROSCOPIC-ADD ON

## 2015-06-28 LAB — TYPE AND SCREEN
ABO/RH(D): B POS
Antibody Screen: NEGATIVE

## 2015-06-28 LAB — SURGICAL PCR SCREEN
MRSA, PCR: NEGATIVE
STAPHYLOCOCCUS AUREUS: NEGATIVE

## 2015-06-28 LAB — ABO/RH: ABO/RH(D): B POS

## 2015-06-28 LAB — PROTIME-INR
INR: 1.05 (ref 0.00–1.49)
PROTHROMBIN TIME: 13.9 s (ref 11.6–15.2)

## 2015-06-28 LAB — APTT: APTT: 28 s (ref 24–37)

## 2015-06-28 NOTE — Progress Notes (Signed)
Pre-op Cardiac Surgery  Carotid Findings:   Study was technically difficult due to [atient body habitus. Findings suggest 1-39% internal carotid artery stenosis bilaterally. Vertebral arteries are patent with antegrade flow.  Upper Extremity Right Left  Brachial Pressures 153-Triphasic 154-Triphasic  Radial Waveforms Triphasic Triphasic  Ulnar Waveforms Triphasic Triphasic  Palmar Arch (Allen's Test) Signal decreases 50% with radial compression, decreases >50% with ulnar compression. Signal reverses with radial compression, decreases >50% with ulnar compression.   Findings:   Bilateral palpable pedal pulses.  06/28/2015 10:26 AM Maudry Mayhew, RVT, RDCS, RDMS

## 2015-06-28 NOTE — H&P (Signed)
[address]       [place] [address]             [phone]          CARDIOTHORACIC SURGERY HISTORY AND PHYSICAL EXAM  Referring Provider is [name], MD PCP is [name], MD  Chief Complaint  Patient presents with  . Atrial Fibrillation    surgical eval for MAZE     HPI:  Patient is a 54 year old morbidly obese male with history of hypertension, chronic diastolic congestive heart failure, recurrent paroxysmal atrial fibrillation, anxiety, and depression who has been referred for surgical consultation to discuss treatment options for management of atrial fibrillation refractory to medical therapy and previous catheter-based ablation 2. The patient first developed recurrent paroxysmal atrial fibrillation in 2014. Transthoracic echocardiogram performed at that time demonstrated normal left ventricular systolic function. Patient underwent an exercise treadmill stress test that was felt to be low risk. He was initially treated with metoprolol and anticoagulated using aspirin alone. And he was referred for a sleep study and diagnosed with obstructive sleep apnea. He has been using CPAP ever since. He continued to suffer from recurrent episodes of paroxysmal atrial fibrillation that made him very symptomatic. He was referred to the atrial fibrillation clinic treated for a period of time with flecainide and later Multaq, but he was intolerant of both. In 2015 he was hospitalized with acute exacerbation of chronic diastolic congestive heart failure associated with recurrent atrial fibrillation and epigastric chest pain. A stress my view exam was performed at that time that was felt to be low risk. He was loaded with Tikosyn but continued to experience recurrent episodes of paroxysmal atrial fibrillation that were associated with shortness of breath, diaphoresis, fatigue, and atypical chest pain. He underwent catheter-based ablation for atrial  fibrillation on 2 occasions in February and again in May of this year, but each time he quickly developed recurrent episodes of paroxysmal atrial fibrillation. He was last seen in follow-up by [name] on 04/18/2015. At that time Tikosyn was discontinued and the patient was started on amiodarone. The patient has continued to experience intermittent episodes of atrial fibrillation on amiodarone therapy, although reportedly his heart rate has been under better control and symptoms have been tolerated somewhat better. The patient has now been referred for surgical consultation to discuss the possibility of maze procedure.  The patient is married and lives locally in [place] with his wife. He has 1 son who is 77 years old in college. The patient is currently working as a courier. He has a remote history of tobacco use although he quit smoking 15 years ago. He denies excessive alcohol consumption. The patient describes chronic symptoms of exertional shortness of breath, fatigue, and atypical chest discomfort that date back more than 2 years. Symptoms are dramatically worse when the patient is out of sinus rhythm, although the patient does experience some exertional shortness of breath even when he is in rhythm. The patient describes tightness across his chest that is typically worse in the mornings and is not necessarily associated with stress. He has mild lower extremity edema. He has palpitations when he is in atrial fibrillation. He has never had any dizzy spells or syncope. He has a chronic dry cough. He uses CPAP at night. He has been unsuccessful in any attempts associated with weight loss. He does not exercise on a regular basis.  Patient returns to the office today for follow-up of chronic recurrent paroxysmal  atrial fibrillation that has failed medical therapy with multiple drugs and catheter-based ablation on 2 previous occasions. He was initially seen in consultation on 06/14/2015. The patient  complains of chronic symptoms of exertional shortness of breath, fatigue, and atypical chest discomfort that have accelerated in frequency and severity and are typically dramatically worse when the patient is out of sinus rhythm. Transthoracic and transesophageal echocardiograms demonstrate normal left ventricular systolic function with moderate left ventricular hypertrophy, moderate asymmetric septal hypertrophy, and moderate mitral regurgitation. Diagnostic cardiac catheterization performed last week demonstrates the presence of single vessel coronary artery disease with critical high-grade stenosis of the large first diagonal branch off of the left anterior descending coronary artery and at least 50% stenosis of the left anterior descending coronary artery. Catheterization was also notable for the presence of moderate pulmonary hypertension. The patient returns to the office today to review the results of his catheterization and discuss treatment options further. He reports no new problems or complaints over the past week.           Past Medical History  Diagnosis Date  . Anxiety   . Depression   . GERD (gastroesophageal reflux disease)   . Gallstones   . PONV (postoperative nausea and vomiting)   . Paroxysmal atrial fibrillation (HCC)     a.  previously intolerant of Flecainide and Multaq b. PVI by Dr [name] 09/21/14  . Obstructive sleep apnea      on CPAP  . Mitral regurgitation   . Essential hypertension 07/15/2013  . Diastolic CHF, chronic ([place]) 07/18/2014  . Morbid obesity ([place]) 12/21/2014  . GERD 09/09/2008    Qualifier: Diagnosis of  By: Sherren Mocha MD, Jory Ee   . Hyperlipidemia   . Chronic diastolic (congestive) heart failure ([place])   . Coronary artery disease 06/20/2015  . Anginal pain ([place])   . Dysrhythmia     AFIB  . History of hiatal hernia   . Arthritis     Past Surgical History  Procedure Laterality Date  . Hand surgery    . Tonsillectomy    . Wisdom tooth extraction     . Cholecystectomy  08/05/2012    Procedure: LAPAROSCOPIC CHOLECYSTECTOMY WITH INTRAOPERATIVE CHOLANGIOGRAM;  Surgeon: [name], MD,FACS;  Location: [facility];  Service: General;  Laterality: N/A;  . Laparoscopic appendectomy N/A 02/13/2014    Procedure: APPENDECTOMY LAPAROSCOPIC;  Surgeon: [name], MD;  Location: [facility];  Service: General;  Laterality: N/A;  . Tee without cardioversion N/A 09/20/2014    Procedure: TRANSESOPHAGEAL ECHOCARDIOGRAM (TEE);  Surgeon: [name], MD;  Location: [facility];  Service: Cardiovascular;  Laterality: N/A;  . Ablation  09/21/14    PVI by Dr [name]  . Atrial fibrillation ablation N/A 09/21/2014    Procedure: ATRIAL FIBRILLATION ABLATION;  Surgeon: [name], MD;  Location: [facility] CATH LAB;  Service: Cardiovascular;  Laterality: N/A;  . Tee without cardioversion N/A 01/10/2015    Procedure: TRANSESOPHAGEAL ECHOCARDIOGRAM (TEE);  Surgeon: Sanda Klein, MD;  Location: [facility] ENDOSCOPY;  Service: Cardiovascular;  Laterality: N/A;  . Electrophysiologic study N/A 01/11/2015    Procedure: Atrial Fibrillation Ablation;  Surgeon: [name], MD;  Location: [facility] CV LAB;  Service: Cardiovascular;  Laterality: N/A;  . Cardiac catheterization N/A 06/20/2015    Procedure: Right/Left Heart Cath and Coronary Angiography;  Surgeon: Burnell Blanks, MD;  Location: [facility] CV LAB;  Service: Cardiovascular;  Laterality: N/A;    Family History  Problem Relation Age of Onset  .  Adopted: Yes  . Other      ADOPTED    Social History Social History  Substance Use Topics  . Smoking status: Former Smoker -- 0.50 packs/day for 30 years    Types: Cigarettes    Quit date: 07/23/2006  . Smokeless tobacco: Never Used  . Alcohol Use: 4.2 - 8.4 oz/week    7-14 Standard drinks or equivalent per week     Comment: social    Prior to Admission medications   Medication Sig Start Date End Date Taking? Authorizing Provider  amiodarone (PACERONE) 200 MG  tablet Take 200 mg by mouth daily.   Yes Historical Provider, MD  apixaban (ELIQUIS) 5 MG TABS tablet Take 5 mg by mouth 2 (two) times daily.   Yes Historical Provider, MD  cholestyramine Lucrezia Starch) 4 G packet Take 4 g by mouth 2 (two) times daily.   Yes Historical Provider, MD  citalopram (CELEXA) 10 MG tablet TAKE 1 TABLET BY MOUTH ONCE DAILY Patient taking differently: TAKE 10 MG BY MOUTH ONCE DAILY 12/15/14  Yes Dorena Cookey, MD  clorazepate (TRANXENE) 7.5 MG tablet TAKE 1 TABLET BY MOUTH AT BEDTIME Patient taking differently: TAKE 7.5 MG BY MOUTH AT BEDTIME 01/21/15  Yes Dorena Cookey, MD  esomeprazole (NEXIUM) 40 MG capsule Take 40 mg by mouth daily at 12 noon.   Yes Historical Provider, MD  furosemide (LASIX) 40 MG tablet Take 1 tablet (40 mg total) by mouth daily. 02/22/15  Yes Sherran Needs, NP  guaiFENesin 200 MG tablet Take 200 mg by mouth 2 (two) times daily as needed (for cold).   Yes Historical Provider, MD  losartan (COZAAR) 50 MG tablet Take 1 tablet (50 mg total) by mouth daily. 02/22/15  Yes Sherran Needs, NP  metoprolol succinate (TOPROL-XL) 25 MG 24 hr tablet Take 1 tablet (25 mg total) by mouth 2 (two) times daily. 12/24/14  Yes [name], MD  potassium chloride SA (K-DUR,KLOR-CON) 20 MEQ tablet Take 1 tablet (20 mEq total) by mouth daily. 02/22/15  Yes Sherran Needs, NP  simvastatin (ZOCOR) 20 MG tablet Take 1 tablet (20 mg total) by mouth daily at 6 PM. 07/23/14  Yes Rhonda G Barrett, PA-C    Allergies  Allergen Reactions  . Flecainide Shortness Of Breath and Other (See Comments)    EXTREME SOB  . Multaq [Dronedarone] Shortness Of Breath  . Penicillins Hives    Review of Systems:  General:normal appetite, decreased energy, + weight gain, no weight loss, no fever Cardiac:+ chest pain with exertion, + chest pain at rest, + SOB with exertion, NO resting SOB, NO PND, NO orthopnea, + palpitations, +  arrhythmia, + atrial fibrillation, + LE edema, NO dizzy spells, no syncope Respiratory:+ shortness of breath, no home oxygen, no productive cough, + dry cough, no bronchitis, + wheezing, no hemoptysis, no asthma, no pain with inspiration or cough, + sleep apnea, + CPAP at night GI:no difficulty swallowing, + reflux controlled on Nexium Rx, no frequent heartburn, no hiatal hernia, no abdominal pain, no constipation, no diarrhea, no hematochezia, no hematemesis, no melena GU:no dysuria, no frequency, no urinary tract infection, no hematuria, no enlarged prostate, no kidney stones, no kidney disease Vascular:no pain suggestive of claudication, no pain in feet, no leg cramps, no varicose veins, no DVT, no non-healing foot ulcer Neuro:no stroke, no TIA's, no seizures, no headaches, no temporary blindness one eye, no slurred speech, no peripheral neuropathy, no chronic pain, no instability of gait, no memory/cognitive dysfunction  Musculoskeletal:no arthritis, no joint swelling, no myalgias, no difficulty walking, normal mobility  Skin:no rash, no itching, no skin infections, no pressure sores or ulcerations Psych:+ anxiety, + depression, no nervousness, + unusual recent stress Eyes:no blurry vision, no floaters, no recent vision changes, + wears glasses or contacts ENT:no hearing loss, no loose or painful teeth, no dentures, last saw dentist May 2016 Hematologic:no easy bruising, no abnormal bleeding, no clotting disorder, no frequent epistaxis Endocrine:no diabetes, does not check CBG's at  home   Physical Exam:  BP 147/96 mmHg  Pulse 76  Resp 20  Ht 5\' 8"  (1.727 m)  Wt 287 lb (130.182 kg)  BMI 43.65 kg/m2  SpO2 97% General:Obese but o/w well-appearing HEENT:Unremarkable  Neck:no JVD, no bruits, no adenopathy  Chest:clear to auscultation, symmetrical breath sounds, no wheezes, no rhonchi  NO:[id number] rate and rhythm, no murmur  Abdomen:soft, non-tender, no masses  Extremities:warm, well-perfused, pulses diminished but palpable, no LE edema Rectal/GUDeferred Neuro:Grossly non-focal and symmetrical throughout Skin:Clean and dry, no rashes, no breakdown   Diagnostic Tests:  MYOCARDIAL IMAGING WITH SPECT (REST AND PHARMACOLOGIC-STRESS)  GATED LEFT VENTRICULAR WALL MOTION STUDY  LEFT VENTRICULAR EJECTION FRACTION  TECHNIQUE: Standard myocardial SPECT imaging was performed after resting intravenous injection of 10 mCi Tc-2m sestamibi. Subsequently, intravenous infusion of Lexiscan was performed under the supervision of the Cardiology staff. At peak effect of the drug, 30 mCi Tc-56m sestamibi was injected intravenously and standard myocardial SPECT imaging was performed. Quantitative gated imaging was also performed to evaluate left ventricular wall motion, and estimate left ventricular ejection fraction.  COMPARISON: None.  FINDINGS: Stess data: Baseline EKG: SR Nonspecific ST changes. With infusion of Lexiscan there were no ST changes to suggest ischemia  Nuclear data: In the initial stress images there was mild thinning in the inferolateral wall (base)  Otherwise normal perfusion. In the recovery images there was no significant change  On gating, LVEF was calculated at 51% with inferior hypokinesis.  ON review of the raw data, diaphragm and bowel activity underlie the inferior/inferoseptal wall.  IMPRESSION: Electrically negative for ischemia. Myoview scan with a small region of mild basal inferolateral thinning consistent with probable soft tissue attenuation, cannot r/o subendocardial scar. No significant ischemia  LVEF on gating calculated at 51% with inferior hypokinesis. Consider echo to further define wall motion  Overall low risk scan   Electronically Signed  By: Dorris Carnes M.D.  On: 07/20/2014 14:57   Transthoracic Echocardiography  Patient:  Daxen, Lanum MR #:    [id number] Study Date: 12/22/2014 Gender:   M Age:    69 Height:   175.3 cm Weight:   127 kg BSA:    2.55 m^2 Pt. Status: Room:    6E11C  Aram Beecham REFERRING  Isaiah Serge ADMITTING  Gevena Barre K SONOGRAPHER Diamond Nickel PERFORMING  [facility], Inpatient ATTENDING  Charlesetta Shanks [id number]  cc:  ------------------------------------------------------------------- LV EF: 65% -  70%  ------------------------------------------------------------------- Indications:   Dyspnea 786.09.  ------------------------------------------------------------------- History:  PMH: Anxiety. depression. Asthma. Obstructive sleep apnea. Atrial fibrillation.  ------------------------------------------------------------------- Study Conclusions  - Left ventricle: The cavity size was normal. There was severe focal basal hypertrophy. Systolic function was vigorous. The estimated ejection fraction was in the range of 65% to 70%. Wall motion was normal; there were no regional wall motion abnormalities. - Aortic valve: Poorly visualized. - Mitral valve: Poorly visualized. There  was trivial regurgitation. - Left atrium: The atrium was mildly dilated. - Pulmonic valve: Poorly visualized.  Transthoracic echocardiography. M-mode, complete 2D, spectral Doppler, and color Doppler. Birthdate: Patient birthdate: 07-Nov-1960. Age: Patient is 54 yr old. Sex: Gender: male. BMI: 41.3 kg/m^2. Blood pressure:   120/78 Patient status: Inpatient. Study date: Study date: 12/22/2014. Study time: 01:32 PM. Location: Bedside.  -------------------------------------------------------------------  ------------------------------------------------------------------- Left ventricle: The cavity size was normal. There was severe focal basal hypertrophy. Systolic function was vigorous. The estimated ejection fraction was in the range of 65% to 70%. Wall motion was normal; there were no regional wall motion abnormalities. Normal sinus rhythm was absent. The study was not technically sufficient to allow evaluation of LV diastolic dysfunction due to atrial fibrillation.  ------------------------------------------------------------------- Aortic valve: Poorly visualized. Doppler: Transvalvular velocity was within the normal range. There was no stenosis. There was no regurgitation.  ------------------------------------------------------------------- Aorta: Aortic root: The aortic root was normal in size.  ------------------------------------------------------------------- Mitral valve: Poorly visualized. Structurally normal valve. Mobility was not restricted. Doppler: Transvalvular velocity was within the normal range. There was no evidence for stenosis. There was trivial regurgitation.  ------------------------------------------------------------------- Left atrium: The atrium was mildly dilated.  ------------------------------------------------------------------- Right ventricle: The cavity size was normal. Wall thickness was normal. Systolic function was  normal.  ------------------------------------------------------------------- Pulmonic valve:  Poorly visualized. Structurally normal valve. Cusp separation was normal. Doppler: Transvalvular velocity was within the normal range. There was no evidence for stenosis. There was no regurgitation.  ------------------------------------------------------------------- Tricuspid valve:  Structurally normal valve.  Doppler: Transvalvular velocity was within the normal range. There was no regurgitation.  ------------------------------------------------------------------- Pulmonary artery:  The main pulmonary artery was normal-sized. Systolic pressure was within the normal range.  ------------------------------------------------------------------- Right atrium: The atrium was normal in size.  ------------------------------------------------------------------- Pericardium: There was no pericardial effusion.  ------------------------------------------------------------------- Systemic veins: Inferior vena cava: The vessel was normal in size.  ------------------------------------------------------------------- Measurements  Left ventricle              Value    Reference LV ID, ED, PLAX chordal    (L)    42.2 mm   43 - 52 LV ID, ES, PLAX chordal         33  mm   23 - 38 LV fx shortening, PLAX chordal (L)    22  %   >=29 LV PW thickness, ED           14.9 mm   --------- IVS/LV PW ratio, ED      (H)    1.81     <=1.3  Ventricular septum            Value    Reference IVS thickness, ED            27  mm   ---------  Aorta                  Value    Reference Aortic root ID, ED            39  mm   ---------  Left atrium               Value    Reference LA ID, A-P, ES              47  mm    --------- LA ID/bsa, A-P              1.85 cm/m^2 <=2.2 LA volume, S               103  ml   --------- LA volume/bsa, S  40.5 ml/m^2 --------- LA volume, ES, 1-p A4C          116  ml   --------- LA volume/bsa, ES, 1-p A4C        45.6 ml/m^2 --------- LA volume, ES, 1-p A2C          87  ml   --------- LA volume/bsa, ES, 1-p A2C        34.2 ml/m^2 ---------  Right ventricle             Value    Reference RV s&', lateral, S            10.5 cm/s  ---------  Legend: (L) and (H) mark values outside specified reference range.  ------------------------------------------------------------------- Prepared and Electronically Authenticated by  Fransico Him, MD 2016-05-04T16:19:57    Transesophageal Echocardiography  Patient:  Talib, Headley MR #:    [id number] Study Date: 01/10/2015 Gender:   M Age:    31 Height:   172.7 cm Weight:   127.3 kg BSA:    2.53 m^2 Pt. Status: Room:  ADMITTING  Sanda Klein, MD ATTENDING  Sanda Klein, MD ORDERING   Sanda Klein, MD PERFORMING  Sanda Klein, MD REFERRING  Sanda Klein, MD SONOGRAPHER Jimmy Reel, RDCS  cc:  ------------------------------------------------------------------- LV EF: 60% -  65%  ------------------------------------------------------------------- Indications:   Atrial fibrillation - 427.31.  ------------------------------------------------------------------- Study Conclusions  - Left ventricle: The cavity size was normal. There was moderate asymmetric hypertrophy of the septum. Systolic function was normal. The estimated ejection fraction was in the range of 60% to 65%. Wall motion was normal; there were no regional wall motion abnormalities. There was a reduced contribution of atrial contraction to ventricular  filling, due to increased ventricular diastolic pressure or atrial contractile dysfunction. - Aortic valve: No evidence of vegetation. - Mitral valve: There was mild systolic anterior motion of the anterior leaflet. No evidence of vegetation. There was mild to moderate regurgitation directed posteriorly. - Left atrium: The atrium was moderately to severely dilated. No evidence of thrombus in the atrial cavity or appendage. There was severecontinuous spontaneous echo contrast (&quot;smoke&quot;) in the appendage. Emptying velocity was severely reduced. - Right atrium: The atrium was dilated. No evidence of thrombus in the atrial cavity or appendage. - Atrial septum: Doppler showed a small left-to-right atrial level shunt, in the baseline state. There was an atrial septal aneurysm. - Tricuspid valve: No evidence of vegetation. - Pulmonic valve: No evidence of vegetation.  Diagnostic transesophageal echocardiography. 2D and color Doppler. Birthdate: Patient birthdate: October 01, 1960. Age: Patient is 54 yr old. Sex: Gender: male.  BMI: 42.7 kg/m^2. Blood pressure: 129/63 Patient status: Outpatient. Study date: Study date: 01/10/2015. Study time: 11:17 AM. Location: Endoscopy.  -------------------------------------------------------------------  ------------------------------------------------------------------- Left ventricle: The cavity size was normal. There was moderate asymmetric hypertrophy of the septum. Systolic function was normal. The estimated ejection fraction was in the range of 60% to 65%. Wall motion was normal; there were no regional wall motion abnormalities. There was a reduced contribution of atrial contraction to ventricular filling, due to increased ventricular diastolic pressure or atrial contractile dysfunction.  ------------------------------------------------------------------- Aortic valve:  Structurally normal valve.  Cusp  separation was normal. No evidence of vegetation. Doppler: There was no regurgitation.  ------------------------------------------------------------------- Aorta: The aorta was normal, not dilated, and non-diseased.  ------------------------------------------------------------------- Mitral valve:  Structurally normal valve.  Leaflet separation was normal. There was mild systolic anterior motion of the anterior leaflet. There is no LVOT gradient, but there is turbulent systolic flow. No  evidence of vegetation. Doppler: There was mild to moderate regurgitation directed posteriorly.  ------------------------------------------------------------------- Left atrium: The atrium was moderately to severely dilated. No evidence of thrombus in the atrial cavity or appendage. There was severecontinuous spontaneous echo contrast (&quot;smoke&quot;) in the appendage. Emptying velocity was severely reduced.  ------------------------------------------------------------------- Atrial septum:  Doppler showed a small left-to-right atrial level shunt, in the baseline state. There is flow both through a PFO and through a tiny ASD in the fossa ovalis, likely a residual from transseptal puncture. There was an atrial septal aneurysm.  ------------------------------------------------------------------- Right ventricle: The cavity size was normal. Wall thickness was normal. Systolic function was normal.  ------------------------------------------------------------------- Pulmonic valve:  Structurally normal valve.  Cusp separation was normal. No evidence of vegetation.  ------------------------------------------------------------------- Tricuspid valve:  Structurally normal valve.  Leaflet separation was normal. No evidence of vegetation. Doppler: There was no regurgitation.  ------------------------------------------------------------------- Right atrium: The atrium was  dilated. No evidence of thrombus in the atrial cavity or appendage.  ------------------------------------------------------------------- Pericardium: There was no pericardial effusion.  ------------------------------------------------------------------- Post procedure conclusions Ascending Aorta:  - The aorta was normal, not dilated, and non-diseased.  ------------------------------------------------------------------- Prepared and Electronically Authenticated by  Sanda Klein, MD 2016-05-23T16:30:03       Atrial Fibrillation Ablation    Conclusion    Editor: [name], MD (Physician)     Expand All Collapse All  SURGEON: [name], MD  PREPROCEDURE DIAGNOSES: 1. Paroxysmal atrial fibrillation.  POSTPROCEDURE DIAGNOSES: 1. Paroxysmal atrial fibrillation.  PROCEDURES: 1. Comprehensive electrophysiologic study. 2. Coronary sinus pacing and recording. 3. Three-dimensional mapping of atrial fibrillation  4. Ablation of atrial fibrillation  5. Intracardiac echocardiography. 6. Transseptal puncture of an intact septum. 7. Rotational Angiography with processing at an independent workstation 8. Arrhythmia induction with pacing with isuprel infusion 9. Cardioversion  INTRODUCTION: ISAIA HASSELL is a 54 y.o. male with a history of paroxysmal atrial fibrillation who now presents for EP study and radiofrequency ablation. The patient reports initially being diagnosed with atrial fibrillation after presenting with symptomatic palpitations and fatgiue. The patient reports increasing frequency and duration of atrial fibrillation since that time. The patient has failed medical therapy with Phyllis Ginger. He underwent prior afib ablation but continues to have atrial fibrillation. The patient therefore presents today for repeat catheter ablation of atrial fibrillation.  DESCRIPTION OF PROCEDURE: Informed written consent was obtained, and the patient  was brought to the electrophysiology lab in a fasting state. The patient was adequately sedated with intravenous medications as outlined in the anesthesia report. The patient's left and right groins were prepped and draped in the usual sterile fashion by the EP lab staff. Using a percutaneous Seldinger technique, two 7-French and one 11-French hemostasis sheaths were placed into the right common femoral vein.  3 Dimensional Rotational Angiography: A 5 french pigtail catheter was introduced through the right common femoral vein and advanced into the inferior venocava. 3 demential rotational angiography was then performed by power injection of 100cc of nonionic contrast. Reprocessing at an independent work station was then performed.  This demonstrated a large sized left atrium with 4 separate pulmonary veins which were also moderate in size. There were no anomalous veins or significant abnormalities. A 3 dimensional rendering of the left atrium was then merged using [employer] onto the [employer] system and registered with intracardiac echo (see below). The pigtail catheter was then removed.  Catheter Placement: A 7-French Biosense Webster Decapolar coronary sinus catheter was introduced through the right common femoral vein and advanced into  the coronary sinus for recording and pacing from this location. A 6-French quadripolar Josephson catheter was introduced through the right common femoral vein and advanced into the right ventricle for recording and pacing. This catheter was then pulled back to the His bundle location.   Initial Measurements: The patient presented to the electrophysiology lab in sinus rhythm. His PR interval measured 160 msec with a QRS duration of 121 msec and a QT interval of 493 msec. The AH interval measured 62 msec and the HV interval measured 45 msec.   Intracardiac Echocardiography: A 10-French Biosense Webster AcuNav intracardiac  echocardiography catheter was introduced through the left common femoral vein and advanced into the right atrium. Intracardiac echocardiography was performed of the left atrium, and a three-dimensional anatomical rendering of the left atrium was performed using CARTO sound technology. The patient was noted to have a large sized left atrium. The interatrial septum was prominent but not aneurysmal. All 4 pulmonary veins were visualized and noted to have separate ostia. The pulmonary veins were moderate in size. The left atrial appendage was visualized and did not reveal thrombus. There was no evidence of pulmonary vein stenosis.   Transseptal Puncture: The middle right common femoral vein sheath was exchanged for an 8.5 [place] SL2 transseptal sheath and transseptal access was achieved in a standard fashion using a Brockenbrough needle under biplane fluoroscopy with intracardiac echocardiography confirmation of the transseptal puncture. Once transseptal access had been achieved, heparin was administered intravenously and intra- arterially in order to maintain an ACT of greater than 300 seconds throughout the procedure.   3D Mapping and Ablation: The His bundle catheter was removed and in its place a 3.5 mm [employer] Thermocool ablation catheter was advanced into the right atrium. The transseptal sheath was pulled back into the IVC over a guidewire. The ablation catheter was advanced across the transseptal hole using the wire as a guide. The transseptal sheath was then re-advanced over the guidewire into the left atrium. A duodecapolar Biosense Webster circular mapping catheter was introduced through the transseptal sheath and positioned over the mouth of all 4 pulmonary veins. Three-dimensional electroanatomical mapping was performed using CARTO technology. This demonstrated electrical activity within the right superior and right inferior pulmonary veins at baseline. The left  sided PVs were quiescent from the prior procedure. The patient underwent successful sequential electrical isolation and anatomical encircling of the right superior and inferior pulmonary veins using radiofrequency current with a circular mapping catheter as a guide using a WACA approach.   Measurements Following Ablation: Following ablation, Isuprel was infused up to 20 mcg/min with no inducible atrial fibrillation, atrial tachycardia, atrial flutter, or sustained PACs. I Ventricular pacing was performed, which revealed midline decremental VA conduction with a VA Wenckebach cycle length of 540msec. Rapid atrial pacing was performed, which revealed an AV Wenckebach cycle length of 380 msec. Electroisolation was then again confirmed in all four pulmonary veins. Pacing was performed along the ablation line which confirmed entrance and exit block. With rapid atrial pacing at 300 msec with isuprel, the patient developed left atrial flutter which quickly degenerated into atrial fibrillation.    Cardioversion: The patient was then cardioverted to sinus rhythm with a single synchronized 360-J biphasic shock with cardioversion electrodes in the anterior-posterior thoracic configuration. He maintained sinus rhythm initially but then had recurence of afib with catheter manipulation within the left atrial, requiring repeat cardioversion with 360J biphasic. He returned to afib. A 3rd shock was also unsuccessful.  The procedure was  therefore considered completed. All catheters were removed, and the sheaths were aspirated and flushed. The patient was transferred to the recovery area for sheath removal per protocol. EBL<51ml. A limited bedside transthoracic echocardiogram revealed no pericardial effusion. There were no early apparent complications.  CONCLUSIONS: 1. Sinus rhythm upon presentation.  2. Rotational Angiography reveals a large sized left atrium with four separate pulmonary veins without  evidence of pulmonary vein stenosis. 3. Return of electrical activity within the right superior and right inferior pulmonary veins at baseline. The left sided PVs were quiescent from the prior procedure. The patient underwent successful sequential electrical isolation and anatomical encircling of the right superior and inferior pulmonary veins using radiofrequency current with a circular mapping catheter as a guide using a WACA approach.  4. Atrial fibrillation induced on isuprel. Cardioverted 5. No early apparent complications.         Cardiac/Coronary CT  TECHNIQUE: The patient was scanned on a Philips 256 scanner.  FINDINGS: A 120 kV prospective scan was triggered in the descending thoracic aorta at 111 HU's. Gantry rotation speed was 270 msecs and collimation was .9 mm. No beta blockade or NTG was given. The 3D data set was reconstructed in 5% intervals of the 67-82 % of the R-R cycle. Diastolic phases were analyzed on a dedicated work station using MPR, MIP and VRT modes. The patient received 80 cc of contrast.  There are 4 pulmonary veins that drains normally into the left atrium (2 on the right and 2 on the left).  Pulmonary veins ostial sizes are as follows:  RUPV: 28 x 18 mm  RLPV: 22 x 21 mm  LUPV: 19 x 12 mm  LLPV: 19 x 16 mm  The left atrial appendage is large and has a filling defect in its most distal portion measuring 24 x 15 mm.  Left atrium is severely dilated measuring 58 mm in 3 chamber view, 44 cm2 in 4 chamber and 43 cm2 in 2 chamber view.  There is mild concentric left ventricular hypertrophy with severe basal septal hypertrophy.  Aorta: Normal caliber of the aortic root and aorta. No dissection.  Aortic Valve: Trileaflet, no calcifications.  Coronary Arteries: The study is suboptimal for coronary evaluation as performed without use of NTG.  There is normal coronary origin with right dominance.  LM is free of  plaque.  LAD is a large vessel that has mild calcified plaque in its proximal segment with 25-50% stenosis.  Mid LAD after the takeoff of the large diagonal branch has moderate non-calcified plaque with associated 51-69% stenosis. D1 has diffuse calcified non-obstructive plaque.  LCX is medium caliber non-dominant vessel that has moderate predominantly calcified plaque in the proximal to mid segment with associated stenosis 59-69%.  RCA is a medium size dominant vessel that gives rise to PDA and PLA. Proximal RCA has minimal diffuse calcified plaque. Mid RCA has mild diffuse calcified plaque with stenosis 25-50% stenosis. Distal RCA is affected by motion artifact and poor dilatation.  IMPRESSION: 1. Normal pulmonary vein drainage with no evidence of pulmonary vein stenosis.  2. Left atrium is severely dilated.  2. The left atrial appendage is large and has a filling defect in its most distal portion suspicious for a thrombus.  3. This study was suboptimal for coronary evaluation as no NTG was used, however there are diffuse calcifications in all three vessels and moderate disease in LAD and LCX. If clinically indicated a functional study is recommended for the evaluation of ischemia.  Houston Siren  Nelson   Electronically Signed  By: Ena Dawley  On: 01/07/2015 18:51    CT CHEST WITHOUT CONTRAST  TECHNIQUE: Multidetector CT imaging of the chest was performed following the standard protocol without intravenous contrast. High resolution imaging of the lungs, as well as inspiratory and expiratory imaging, was performed.  COMPARISON: 12/21/2014 chest CT angiogram.  FINDINGS: Mediastinum/Nodes: Normal heart size. No pericardial fluid/thickening. There is atherosclerosis of the thoracic aorta, the great vessels of the mediastinum and the coronary arteries, including calcified atherosclerotic plaque in the left main, left anterior descending, left  circumflex and right coronary arteries. Great vessels are normal in course and caliber. Normal visualized thyroid. Normal esophagus. No axillary, mediastinal or gross hilar lymphadenopathy, noting limited sensitivity for the detection of hilar adenopathy on this noncontrast study.  Lungs/Pleura: No pneumothorax. No pleural effusion. There is a 3 mm solid pulmonary nodule in the posterior lower right upper lobe (series 5/ image 23), stable since 01/07/2015. There is a 2 mm peripheral left upper lobe pulmonary nodule (5/12), stable since 01/07/2015. No acute consolidative airspace disease, new significant pulmonary nodules or lung masses. There are no significant regions of subpleural reticulation, ground-glass attenuation, traction bronchiectasis, parenchymal banding or frank honeycombing. There is no significant air trapping on the expiration sequence.  Upper abdomen: Unremarkable.  Musculoskeletal: No aggressive appearing focal osseous lesions. Mild-to-moderate degenerative changes in the thoracic spine.  IMPRESSION: 1. No evidence of interstitial lung disease. Specifically, no significant regions of subpleural reticulation, ground-glass attenuation, traction bronchiectasis or frank honeycombing. 2. No evidence of significant air trapping. 3. Two tiny pulmonary nodules, largest 3 mm in the right upper lobe, for which 4 month stability has been demonstrated. Follow-up chest CT at 1 year is recommended in this patient with a history of smoking. This recommendation follows the consensus statement: Guidelines for Management of Small Pulmonary Nodules Detected on CT Scans: A Statement from the Fleischner Society as published in Radiology 2005; 237:395-400. 4. Atherosclerosis, including left main and 3 vessel coronary artery disease. Please note that although the presence of coronary artery calcium documents the presence of coronary artery disease, the severity of this disease and  any potential stenosis cannot be assessed on this non-gated CT examination. Assessment for potential risk factor modification, dietary therapy or pharmacologic therapy may be warranted, if clinically indicated.   Electronically Signed  By: Ilona Sorrel M.D.  On: 05/27/2015 16:20  CARDIAC CATHETERIZATION  Procedures    Right/Left Heart Cath and Coronary Angiography    Conclusion     Mid RCA lesion, 30% stenosed.  Mid Cx lesion, 30% stenosed.  1st Mrg lesion, 20% stenosed.  1st Diag lesion, 99% stenosed.  2nd Diag lesion, 60% stenosed.  Prox LAD to Mid LAD lesion, 30% stenosed.  Mid LAD lesion, 50% stenosed.  1. Severe stenosis moderate caliber Diagonal branch 2. Moderate stenosis mid LAD 3. Mild stenosis RCA 4. Mild stenosis Circumflex/OM 5. Elevated filling pressures  Recommendations: Will review images with Dr. Roxy Manns. He is planning a MAZE procedure. The Diagonal lesion could be easily treated with PCI/stenting but timing will be based around timing of surgical procedure. Will start ASA 81 mg daily. Resume Eliquis tomorrow. D/C home today.      Indications    Coronary artery disease involving native coronary artery of native heart with unstable angina pectoris ([place]) [I25.110 (ICD-10-CM)]    Technique and Indications    Estimated blood loss <50 mL. Indication: Atrial fibrillation s/p two failed ablations and chest pain c/w unstable angina, CAD  noted by CT chest. Pt referred for cath prior to MAZE procedure.   Procedure: The risks, benefits, complications, treatment options, and expected outcomes were discussed with the patient. The patient and/or family concurred with the proposed plan, giving informed consent. The patient was brought to the cath lab after IV hydration was begun and oral premedication was given. The patient was further sedated with Versed and Fentanyl. There was an IV catheter present in the right antecubital vein. This area  was prepped and draped. I traded this catheter out over a wire for a 5 Fr Sheath. Right heart catheterization performed with a balloon tipped catheter. The right wrist was assessed with a modified Allens test which was positive. The right wrist was prepped and draped in a sterile fashion. 1% lidocaine was used for local anesthesia. Using the modified Seldinger access technique, a 5 French sheath was placed in the right radial artery. 3 mg Verapamil was given through the sheath. 6000 units IV heparin was given. Standard diagnostic catheters were used to perform selective coronary angiography. A pigtail catheter was used to cross the aortic valve into the LV. Pressures measured but no LV gram performed. The sheath was removed from the right radial artery and a Terumo hemostasis band was applied at the arteriotomy site on the right wrist.   No complications.    Coronary Findings    Dominance: Right   Left Anterior Descending   . Prox LAD to Mid LAD lesion, 30% stenosed. diffuse.   . Mid LAD lesion, 50% stenosed. discrete.   . First Diagonal Branch   The vessel is moderate in size.   . 1st Diag lesion, 99% stenosed. discrete.   . Second Diagonal Branch   The vessel is small in size.   . 2nd Diag lesion, 60% stenosed. discrete.     Left Circumflex  . Vessel is large.   . Mid Cx lesion, 30% stenosed. diffuse.   . First Obtuse Marginal Branch   The vessel is moderate in size.   . 1st Mrg lesion, 20% stenosed. diffuse.   . Second Obtuse Marginal Branch   The vessel is small in size.     Right Coronary Artery  . Vessel is large.   . Mid RCA lesion, 30% stenosed. discrete.       Right Heart Pressures Hemodynamic findings consistent with moderate pulmonary hypertension. Elevated LV EDP consistent with volume overload.    Coronary Diagrams    Diagnostic Diagram            Implants    Name ID Temporary  Type Supply   No information to display    PACS Images    Show images for Cardiac catheterization     Link to Procedure Log    Procedure Log      Hemo Data       Most Recent Value   Fick Cardiac Output  4.77 L/min   Fick Cardiac Output Index  2.01 (L/min)/BSA   RA A Wave  20 mmHg   RA V Wave  19 mmHg   RA Mean  18 mmHg   RV Systolic Pressure  55 mmHg   RV Diastolic Pressure  12 mmHg   RV EDP  20 mmHg   PA Systolic Pressure  54 mmHg   PA Diastolic Pressure  27 mmHg   PA Mean  39 mmHg   PW A Wave  34 mmHg   PW V Wave  46 mmHg   PW Mean  32  mmHg   AO Systolic Pressure  703 mmHg   AO Diastolic Pressure  84 mmHg   AO Mean  500 mmHg   LV Systolic Pressure  938 mmHg   LV Diastolic Pressure  17 mmHg   LV EDP  26 mmHg   Arterial Occlusion Pressure Extended Systolic Pressure  182 mmHg   Arterial Occlusion Pressure Extended Diastolic Pressure  77 mmHg   Arterial Occlusion Pressure Extended Mean Pressure  98 mmHg   Left Ventricular Apex Extended Systolic Pressure  993 mmHg   Left Ventricular Apex Extended Diastolic Pressure  13 mmHg   Left Ventricular Apex Extended EDP Pressure  28 mmHg   QP/QS  1   TPVR Index  19.39 HRUI   TSVR Index  52.67 HRUI   PVR SVR Ratio  0.08   TPVR/TSVR Ratio  0.37                  Impression:  Patient has long-standing history of chronic recurrent paroxysmal atrial fibrillation that has failed medical therapy with multiple drugs and catheter based ablation on 2 previous occasions. He continues to experience acute exacerbations of exertional shortness of breath, atypical chest discomfort, and fatigue when he is out of sinus rhythm. Comorbid medical problems include long-standing hypertension with likely significant diastolic dysfunction and morbid obesity with  obstructive sleep apnea. I have personally reviewed the patient's most recent transthoracic and transesophageal echocardiograms, cardiac gated CTA of the heart, and high resolution CT scan of the chest. The patient has preserved left ventricular systolic function with moderate left ventricular hypertrophy and moderate asymmetric septal hypertrophy without clear evidence for dynamic left ventricular outflow tract obstruction. The patient also has type I mitral valve dysfunction with moderate mitral regurgitation. Cardiac gated CT angiogram of the heart and high resolution CT scan of the chest are both suggestive of the presence of significant left main and three-vessel coronary artery disease. Diagnostic cardiac catheterization performed last week demonstrates the presence of single vessel coronary artery disease with critical high-grade stenosis of the large first diagonal branch off of the left anterior descending coronary artery and at least 50% stenosis of the left anterior descending coronary artery. Catheterization was also notable for the presence of moderate pulmonary hypertension. Options include continued medical therapy without any type of intervention versus proceeding with coronary revascularization and surgical Maze procedure.    Plan:  I have discussed options at length with the patient in the office this afternoon. We discussed the possibility of continued medical therapy indefinitely versus PCI and stenting of the diagonal branch followed by delayed maze procedure via right minithoracotomy approach versus proceeding with combined coronary artery bypass grafting and Maze procedure via conventional median sternotomy. The relative risks and benefits of each of these procedures has been discussed in detail. The patient desires to proceed with combined surgical procedure including coronary artery bypass grafting and Maze procedure the conventional median sternotomy as soon as practical. At  the time of surgery we will reassess the severity of mitral regurgitation and consider concomitant ring annuloplasty if necessary. The patient understands and accepts all potential associated risks of surgery including but not limited to risk of death, stroke or other neurologic complication, myocardial infarction, congestive heart failure, respiratory failure, renal failure, bleeding requiring blood transfusion and/or reexploration, aortic dissection or other major vascular complication, arrhythmia, heart block or bradycardia requiring permanent pacemaker, pneumonia, pleural effusion, wound infection, pulmonary embolus or other thromboembolic complication, chronic pain or other delayed complications related to median sternotomy,  or the late recurrence of symptomatic ischemic heart disease and/or congestive heart failure. The relative risks and benefits of performing a maze procedure was discussed at length, including the expected likelihood of long term freedom from recurrent symptomatic atrial fibrillation and/or atrial flutter. The importance of long term risk modification have been emphasized. All questions answered.   I spent in excess of 30 minutes during the conduct of this office consultation and >50% of this time involved direct face-to-face encounter with the patient for counseling and/or coordination of their care.   Valentina Gu. Roxy Manns, MD 06/23/2015 4:24 PM

## 2015-06-28 NOTE — Pre-Procedure Instructions (Signed)
    Harold Scott  06/28/2015      [id number] - [place], [place] [address] [place] [address] Phone: [phone] Fax: [phone]    Your procedure is scheduled on   Thursday  06/30/15  Report to [facility] Admitting at 530 A.M.  Call this number if you have problems the morning of surgery:  [phone]   Remember:  Do not eat food or drink liquids after midnight.  Take these medicines the morning of surgery with A SIP OF WATER    METOPROLOL(TOPROL)   Do not wear jewelry, make-up or nail polish.  Do not wear lotions, powders, or perfumes.  You may wear deodorant.  Do not shave 48 hours prior to surgery.  Men may shave face and neck.  Do not bring valuables to the hospital.  [facility] is not responsible for any belongings or valuables.  Contacts, dentures or bridgework may not be worn into surgery.  Leave your suitcase in the car.  After surgery it may be brought to your room.  For patients admitted to the hospital, discharge time will be determined by your treatment team.  Patients discharged the day of surgery will not be allowed to drive home.   Name and phone number of your driver:  Special instructions:  PREPARING FOR SURGERY  Please read over the following fact sheets that you were given. Pain Booklet, Coughing and Deep Breathing, Blood Transfusion Information, MRSA Information and Surgical Site Infection Prevention

## 2015-06-29 ENCOUNTER — Encounter (HOSPITAL_COMMUNITY): Payer: Self-pay | Admitting: Certified Registered Nurse Anesthetist

## 2015-06-29 LAB — HEMOGLOBIN A1C
HEMOGLOBIN A1C: 6.4 % — AB (ref 4.8–5.6)
Mean Plasma Glucose: 137 mg/dL

## 2015-06-29 MED ORDER — CHLORHEXIDINE GLUCONATE 0.12 % MT SOLN
15.0000 mL | Freq: Once | OROMUCOSAL | Status: DC
  Filled 2015-06-29: qty 15

## 2015-06-29 MED ORDER — SODIUM CHLORIDE 0.9 % IV SOLN
INTRAVENOUS | Status: DC
  Filled 2015-06-29: qty 40

## 2015-06-29 MED ORDER — SODIUM CHLORIDE 0.9 % IV SOLN
INTRAVENOUS | Status: AC
  Administered 2015-06-30: 1000 mL
  Filled 2015-06-29: qty 1000

## 2015-06-29 MED ORDER — NITROGLYCERIN IN D5W 200-5 MCG/ML-% IV SOLN
2.0000 ug/min | INTRAVENOUS | Status: AC
  Administered 2015-06-30: 5 ug/min via INTRAVENOUS
  Filled 2015-06-29: qty 250

## 2015-06-29 MED ORDER — POTASSIUM CHLORIDE 2 MEQ/ML IV SOLN
80.0000 meq | INTRAVENOUS | Status: DC
  Filled 2015-06-29: qty 40

## 2015-06-29 MED ORDER — PHENYLEPHRINE HCL 10 MG/ML IJ SOLN
30.0000 ug/min | INTRAVENOUS | Status: DC
  Filled 2015-06-29: qty 2

## 2015-06-29 MED ORDER — CHLORHEXIDINE GLUCONATE 4 % EX LIQD: 30.0000 mL | CUTANEOUS | Status: DC

## 2015-06-29 MED ORDER — DEXMEDETOMIDINE HCL IN NACL 400 MCG/100ML IV SOLN
0.1000 ug/kg/h | INTRAVENOUS | Status: DC
  Filled 2015-06-29: qty 100

## 2015-06-29 MED ORDER — CEFUROXIME SODIUM 1.5 G IJ SOLR
1.5000 g | INTRAMUSCULAR | Status: AC
  Administered 2015-06-30: .75 g via INTRAVENOUS
  Administered 2015-06-30: 1.5 g via INTRAVENOUS
  Filled 2015-06-29: qty 1.5

## 2015-06-29 MED ORDER — DEXTROSE 5 % IV SOLN
750.0000 mg | INTRAVENOUS | Status: DC
  Filled 2015-06-29: qty 750

## 2015-06-29 MED ORDER — VANCOMYCIN HCL 10 G IV SOLR
1250.0000 mg | INTRAVENOUS | Status: AC
  Administered 2015-06-30: 1250 mg via INTRAVENOUS
  Filled 2015-06-29: qty 1250

## 2015-06-29 MED ORDER — SODIUM CHLORIDE 0.9 % IV SOLN
INTRAVENOUS | Status: DC
  Filled 2015-06-29: qty 30

## 2015-06-29 MED ORDER — PAPAVERINE HCL 30 MG/ML IJ SOLN
INTRAMUSCULAR | Status: AC
  Administered 2015-06-30: 500 mL
  Filled 2015-06-29: qty 2.5

## 2015-06-29 MED ORDER — MAGNESIUM SULFATE 50 % IJ SOLN
40.0000 meq | INTRAMUSCULAR | Status: DC
  Filled 2015-06-29: qty 10

## 2015-06-29 MED ORDER — DOPAMINE-DEXTROSE 3.2-5 MG/ML-% IV SOLN
0.0000 ug/kg/min | INTRAVENOUS | Status: DC
Start: 2015-06-30 — End: 2015-06-30
  Filled 2015-06-29: qty 250

## 2015-06-29 MED ORDER — EPINEPHRINE HCL 1 MG/ML IJ SOLN
0.0000 ug/min | INTRAVENOUS | Status: DC
  Filled 2015-06-29: qty 4

## 2015-06-29 MED ORDER — METOPROLOL TARTRATE 12.5 MG HALF TABLET: 12.5000 mg | ORAL_TABLET | Freq: Once | ORAL | Status: DC

## 2015-06-29 MED ORDER — SODIUM CHLORIDE 0.9 % IV SOLN
INTRAVENOUS | Status: DC
  Filled 2015-06-29: qty 2.5

## 2015-06-30 ENCOUNTER — Inpatient Hospital Stay (HOSPITAL_COMMUNITY): Payer: 59

## 2015-06-30 ENCOUNTER — Encounter (HOSPITAL_COMMUNITY)
Admission: AD | Disposition: A | Discharge status: Home / Self Care | Payer: 59 | Source: Ambulatory Visit | Attending: Thoracic Surgery (Cardiothoracic Vascular Surgery)

## 2015-06-30 ENCOUNTER — Inpatient Hospital Stay (HOSPITAL_COMMUNITY): Payer: 59 | Admitting: Certified Registered Nurse Anesthetist

## 2015-06-30 ENCOUNTER — Encounter (HOSPITAL_COMMUNITY): Payer: Self-pay | Admitting: *Deleted

## 2015-06-30 ENCOUNTER — Inpatient Hospital Stay (HOSPITAL_COMMUNITY)
Admission: AD | Admit: 2015-06-30 | Discharge: 2015-07-07 | DRG: 229 | Disposition: A | Discharge status: Home / Self Care | Payer: 59 | Source: Ambulatory Visit | Attending: Thoracic Surgery (Cardiothoracic Vascular Surgery) | Admitting: Thoracic Surgery (Cardiothoracic Vascular Surgery)

## 2015-06-30 DIAGNOSIS — E119 Type 2 diabetes mellitus without complications: Secondary | ICD-10-CM | POA: Diagnosis present

## 2015-06-30 DIAGNOSIS — Z951 Presence of aortocoronary bypass graft: Secondary | ICD-10-CM

## 2015-06-30 DIAGNOSIS — I5033 Acute on chronic diastolic (congestive) heart failure: Secondary | ICD-10-CM | POA: Diagnosis present

## 2015-06-30 DIAGNOSIS — Z880 Allergy status to penicillin: Secondary | ICD-10-CM

## 2015-06-30 DIAGNOSIS — R0602 Shortness of breath: Secondary | ICD-10-CM | POA: Diagnosis present

## 2015-06-30 DIAGNOSIS — I2510 Atherosclerotic heart disease of native coronary artery without angina pectoris: Secondary | ICD-10-CM | POA: Diagnosis present

## 2015-06-30 DIAGNOSIS — Z6841 Body Mass Index (BMI) 40.0 and over, adult: Secondary | ICD-10-CM

## 2015-06-30 DIAGNOSIS — I25110 Atherosclerotic heart disease of native coronary artery with unstable angina pectoris: Secondary | ICD-10-CM | POA: Diagnosis present

## 2015-06-30 DIAGNOSIS — I484 Atypical atrial flutter: Secondary | ICD-10-CM | POA: Diagnosis not present

## 2015-06-30 DIAGNOSIS — G4733 Obstructive sleep apnea (adult) (pediatric): Secondary | ICD-10-CM | POA: Diagnosis present

## 2015-06-30 DIAGNOSIS — I110 Hypertensive heart disease with heart failure: Secondary | ICD-10-CM | POA: Diagnosis present

## 2015-06-30 DIAGNOSIS — E872 Acidosis: Secondary | ICD-10-CM | POA: Diagnosis not present

## 2015-06-30 DIAGNOSIS — I272 Other secondary pulmonary hypertension: Secondary | ICD-10-CM | POA: Diagnosis present

## 2015-06-30 DIAGNOSIS — K219 Gastro-esophageal reflux disease without esophagitis: Secondary | ICD-10-CM | POA: Diagnosis present

## 2015-06-30 DIAGNOSIS — Z79899 Other long term (current) drug therapy: Secondary | ICD-10-CM

## 2015-06-30 DIAGNOSIS — F329 Major depressive disorder, single episode, unspecified: Secondary | ICD-10-CM | POA: Diagnosis present

## 2015-06-30 DIAGNOSIS — I4891 Unspecified atrial fibrillation: Secondary | ICD-10-CM | POA: Diagnosis present

## 2015-06-30 DIAGNOSIS — F419 Anxiety disorder, unspecified: Secondary | ICD-10-CM | POA: Diagnosis present

## 2015-06-30 DIAGNOSIS — D62 Acute posthemorrhagic anemia: Secondary | ICD-10-CM | POA: Diagnosis not present

## 2015-06-30 DIAGNOSIS — J9811 Atelectasis: Secondary | ICD-10-CM | POA: Diagnosis not present

## 2015-06-30 DIAGNOSIS — E875 Hyperkalemia: Secondary | ICD-10-CM | POA: Diagnosis present

## 2015-06-30 DIAGNOSIS — I4819 Other persistent atrial fibrillation: Secondary | ICD-10-CM | POA: Diagnosis present

## 2015-06-30 DIAGNOSIS — Z7901 Long term (current) use of anticoagulants: Secondary | ICD-10-CM | POA: Diagnosis not present

## 2015-06-30 DIAGNOSIS — E785 Hyperlipidemia, unspecified: Secondary | ICD-10-CM | POA: Diagnosis present

## 2015-06-30 DIAGNOSIS — Z87891 Personal history of nicotine dependence: Secondary | ICD-10-CM | POA: Diagnosis not present

## 2015-06-30 DIAGNOSIS — I480 Paroxysmal atrial fibrillation: Principal | ICD-10-CM | POA: Diagnosis present

## 2015-06-30 DIAGNOSIS — I5032 Chronic diastolic (congestive) heart failure: Secondary | ICD-10-CM | POA: Diagnosis present

## 2015-06-30 DIAGNOSIS — I200 Unstable angina: Secondary | ICD-10-CM | POA: Diagnosis present

## 2015-06-30 DIAGNOSIS — Z888 Allergy status to other drugs, medicaments and biological substances status: Secondary | ICD-10-CM | POA: Diagnosis not present

## 2015-06-30 DIAGNOSIS — Z9889 Other specified postprocedural states: Secondary | ICD-10-CM

## 2015-06-30 DIAGNOSIS — I10 Essential (primary) hypertension: Secondary | ICD-10-CM | POA: Diagnosis present

## 2015-06-30 DIAGNOSIS — I340 Nonrheumatic mitral (valve) insufficiency: Secondary | ICD-10-CM | POA: Diagnosis present

## 2015-06-30 DIAGNOSIS — Z8679 Personal history of other diseases of the circulatory system: Secondary | ICD-10-CM

## 2015-06-30 HISTORY — DX: Personal history of other diseases of the circulatory system: Z86.79

## 2015-06-30 HISTORY — DX: Other specified postprocedural states: Z98.890

## 2015-06-30 HISTORY — PX: CORONARY ARTERY BYPASS GRAFT: SHX141

## 2015-06-30 HISTORY — PX: TEE WITHOUT CARDIOVERSION: SHX5443

## 2015-06-30 HISTORY — PX: MAZE: SHX5063

## 2015-06-30 LAB — POCT I-STAT 3, ART BLOOD GAS (G3+)
ACID-BASE DEFICIT: 4 mmol/L — AB (ref 0.0–2.0)
ACID-BASE DEFICIT: 1 mmol/L (ref 0.0–2.0)
ACID-BASE EXCESS: 2 mmol/L (ref 0.0–2.0)
Acid-base deficit: 3 mmol/L — ABNORMAL HIGH (ref 0.0–2.0)
BICARBONATE: 28.3 meq/L — AB (ref 20.0–24.0)
BICARBONATE: 25.1 meq/L — AB (ref 20.0–24.0)
Bicarbonate: 23.3 mEq/L (ref 20.0–24.0)
Bicarbonate: 25 mEq/L — ABNORMAL HIGH (ref 20.0–24.0)
O2 SAT: 95 %
O2 SAT: 94 %
O2 Saturation: 100 %
O2 Saturation: 93 %
PCO2 ART: 51.5 mmHg — AB (ref 35.0–45.0)
PH ART: 7.249 — AB (ref 7.350–7.450)
PH ART: 7.269 — AB (ref 7.350–7.450)
PH ART: 7.328 — AB (ref 7.350–7.450)
PO2 ART: 364 mmHg — AB (ref 80.0–100.0)
Patient temperature: 38.1
TCO2: 30 mmol/L (ref 0–100)
TCO2: 27 mmol/L (ref 0–100)
TCO2: 25 mmol/L (ref 0–100)
TCO2: 26 mmol/L (ref 0–100)
pCO2 arterial: 55 mmHg — ABNORMAL HIGH (ref 35.0–45.0)
pCO2 arterial: 57.9 mmHg (ref 35.0–45.0)
pCO2 arterial: 48.3 mmHg — ABNORMAL HIGH (ref 35.0–45.0)
pH, Arterial: 7.319 — ABNORMAL LOW (ref 7.350–7.450)
pO2, Arterial: 84 mmHg (ref 80.0–100.0)
pO2, Arterial: 90 mmHg (ref 80.0–100.0)
pO2, Arterial: 81 mmHg (ref 80.0–100.0)

## 2015-06-30 LAB — POCT I-STAT, CHEM 8
BUN: 13 mg/dL (ref 6–20)
BUN: 13 mg/dL (ref 6–20)
BUN: 11 mg/dL (ref 6–20)
BUN: 11 mg/dL (ref 6–20)
BUN: 11 mg/dL (ref 6–20)
BUN: 11 mg/dL (ref 6–20)
CALCIUM ION: 1.17 mmol/L (ref 1.12–1.23)
CALCIUM ION: 1.01 mmol/L — AB (ref 1.12–1.23)
CALCIUM ION: 1.08 mmol/L — AB (ref 1.12–1.23)
CHLORIDE: 97 mmol/L — AB (ref 101–111)
CHLORIDE: 98 mmol/L — AB (ref 101–111)
CHLORIDE: 91 mmol/L — AB (ref 101–111)
CHLORIDE: 99 mmol/L — AB (ref 101–111)
CREATININE: 0.8 mg/dL (ref 0.61–1.24)
CREATININE: 0.9 mg/dL (ref 0.61–1.24)
CREATININE: 0.7 mg/dL (ref 0.61–1.24)
CREATININE: 0.8 mg/dL (ref 0.61–1.24)
Calcium, Ion: 1.18 mmol/L (ref 1.12–1.23)
Calcium, Ion: 1.02 mmol/L — ABNORMAL LOW (ref 1.12–1.23)
Calcium, Ion: 1.22 mmol/L (ref 1.12–1.23)
Chloride: 96 mmol/L — ABNORMAL LOW (ref 101–111)
Chloride: 101 mmol/L (ref 101–111)
Creatinine, Ser: 0.7 mg/dL (ref 0.61–1.24)
Creatinine, Ser: 0.8 mg/dL (ref 0.61–1.24)
GLUCOSE: 130 mg/dL — AB (ref 65–99)
GLUCOSE: 129 mg/dL — AB (ref 65–99)
GLUCOSE: 155 mg/dL — AB (ref 65–99)
GLUCOSE: 162 mg/dL — AB (ref 65–99)
Glucose, Bld: 116 mg/dL — ABNORMAL HIGH (ref 65–99)
Glucose, Bld: 177 mg/dL — ABNORMAL HIGH (ref 65–99)
HCT: 41 % (ref 39.0–52.0)
HCT: 32 % — ABNORMAL LOW (ref 39.0–52.0)
HCT: 34 % — ABNORMAL LOW (ref 39.0–52.0)
HEMATOCRIT: 37 % — AB (ref 39.0–52.0)
HEMATOCRIT: 28 % — AB (ref 39.0–52.0)
HEMATOCRIT: 30 % — AB (ref 39.0–52.0)
HEMOGLOBIN: 12.6 g/dL — AB (ref 13.0–17.0)
Hemoglobin: 13.9 g/dL (ref 13.0–17.0)
Hemoglobin: 9.5 g/dL — ABNORMAL LOW (ref 13.0–17.0)
Hemoglobin: 10.2 g/dL — ABNORMAL LOW (ref 13.0–17.0)
Hemoglobin: 10.9 g/dL — ABNORMAL LOW (ref 13.0–17.0)
Hemoglobin: 11.6 g/dL — ABNORMAL LOW (ref 13.0–17.0)
POTASSIUM: 4.3 mmol/L (ref 3.5–5.1)
POTASSIUM: 4.7 mmol/L (ref 3.5–5.1)
POTASSIUM: 4.2 mmol/L (ref 3.5–5.1)
Potassium: 4.3 mmol/L (ref 3.5–5.1)
Potassium: 3.9 mmol/L (ref 3.5–5.1)
Potassium: 4.9 mmol/L (ref 3.5–5.1)
SODIUM: 131 mmol/L — AB (ref 135–145)
SODIUM: 137 mmol/L (ref 135–145)
Sodium: 137 mmol/L (ref 135–145)
Sodium: 136 mmol/L (ref 135–145)
Sodium: 134 mmol/L — ABNORMAL LOW (ref 135–145)
Sodium: 135 mmol/L (ref 135–145)
TCO2: 28 mmol/L (ref 0–100)
TCO2: 25 mmol/L (ref 0–100)
TCO2: 27 mmol/L (ref 0–100)
TCO2: 27 mmol/L (ref 0–100)
TCO2: 24 mmol/L (ref 0–100)
TCO2: 23 mmol/L (ref 0–100)

## 2015-06-30 LAB — POCT I-STAT 4, (NA,K, GLUC, HGB,HCT)
GLUCOSE: 132 mg/dL — AB (ref 65–99)
HCT: 36 % — ABNORMAL LOW (ref 39.0–52.0)
Hemoglobin: 12.2 g/dL — ABNORMAL LOW (ref 13.0–17.0)
Potassium: 4.7 mmol/L (ref 3.5–5.1)
Sodium: 136 mmol/L (ref 135–145)

## 2015-06-30 LAB — CREATININE, SERUM: CREATININE: 1.08 mg/dL (ref 0.61–1.24)

## 2015-06-30 LAB — CBC
HCT: 34.2 % — ABNORMAL LOW (ref 39.0–52.0)
HEMATOCRIT: 36.1 % — AB (ref 39.0–52.0)
HEMOGLOBIN: 12.2 g/dL — AB (ref 13.0–17.0)
HEMOGLOBIN: 11 g/dL — AB (ref 13.0–17.0)
MCH: 31.6 pg (ref 26.0–34.0)
MCH: 30.3 pg (ref 26.0–34.0)
MCHC: 33.8 g/dL (ref 30.0–36.0)
MCHC: 32.2 g/dL (ref 30.0–36.0)
MCV: 93.5 fL (ref 78.0–100.0)
MCV: 94.2 fL (ref 78.0–100.0)
Platelets: 208 10*3/uL (ref 150–400)
Platelets: 187 10*3/uL (ref 150–400)
RBC: 3.86 MIL/uL — ABNORMAL LOW (ref 4.22–5.81)
RBC: 3.63 MIL/uL — AB (ref 4.22–5.81)
RDW: 13.8 % (ref 11.5–15.5)
RDW: 13.7 % (ref 11.5–15.5)
WBC: 24.5 10*3/uL — AB (ref 4.0–10.5)
WBC: 15.7 10*3/uL — AB (ref 4.0–10.5)

## 2015-06-30 LAB — APTT: APTT: 26 s (ref 24–37)

## 2015-06-30 LAB — PROTIME-INR
INR: 1.37 (ref 0.00–1.49)
PROTHROMBIN TIME: 17 s — AB (ref 11.6–15.2)

## 2015-06-30 LAB — PLATELET COUNT: Platelets: 178 10*3/uL (ref 150–400)

## 2015-06-30 LAB — MAGNESIUM: MAGNESIUM: 2.4 mg/dL (ref 1.7–2.4)

## 2015-06-30 SURGERY — CORONARY ARTERY BYPASS GRAFTING (CABG)
Anesthesia: General | Site: Chest

## 2015-06-30 MED ORDER — LACTATED RINGERS IV SOLN: 500.0000 mL | Freq: Once | INTRAVENOUS | Status: DC | PRN

## 2015-06-30 MED ORDER — MIDAZOLAM HCL 5 MG/5ML IJ SOLN
INTRAMUSCULAR | Status: DC | PRN
  Administered 2015-06-30: 2 mg via INTRAVENOUS
  Administered 2015-06-30: 1 mg via INTRAVENOUS
  Administered 2015-06-30: 5 mg via INTRAVENOUS
  Administered 2015-06-30 (×2): 1 mg via INTRAVENOUS

## 2015-06-30 MED ORDER — NITROGLYCERIN IN D5W 200-5 MCG/ML-% IV SOLN
0.0000 ug/min | INTRAVENOUS | Status: DC
  Filled 2015-06-30: qty 250

## 2015-06-30 MED ORDER — PHENYLEPHRINE HCL 10 MG/ML IJ SOLN
0.0000 ug/min | INTRAMUSCULAR | Status: DC
  Filled 2015-06-30 (×2): qty 2

## 2015-06-30 MED ORDER — METOPROLOL TARTRATE 12.5 MG HALF TABLET
12.5000 mg | ORAL_TABLET | Freq: Two times a day (BID) | ORAL | Status: DC
  Administered 2015-07-01 – 2015-07-02 (×3): 12.5 mg via ORAL
  Filled 2015-06-30 (×4): qty 1

## 2015-06-30 MED ORDER — METOPROLOL TARTRATE 25 MG/10 ML ORAL SUSPENSION
12.5000 mg | Freq: Two times a day (BID) | ORAL | Status: DC
Start: 2015-06-30 — End: 2015-07-01
  Filled 2015-06-30 (×2): qty 5

## 2015-06-30 MED ORDER — LACTATED RINGERS IV SOLN
INTRAVENOUS | Status: DC | PRN
  Administered 2015-06-30 (×2): via INTRAVENOUS

## 2015-06-30 MED ORDER — ACETAMINOPHEN 650 MG RE SUPP
650.0000 mg | Freq: Once | RECTAL | Status: AC
  Administered 2015-06-30: 650 mg via RECTAL

## 2015-06-30 MED ORDER — PROTAMINE SULFATE 10 MG/ML IV SOLN
INTRAVENOUS | Status: AC
  Filled 2015-06-30: qty 50

## 2015-06-30 MED ORDER — SODIUM CHLORIDE 0.9 % IV SOLN
250.0000 [IU] | INTRAVENOUS | Status: DC | PRN
  Administered 2015-06-30: 1 [IU]/h via INTRAVENOUS

## 2015-06-30 MED ORDER — VANCOMYCIN HCL IN DEXTROSE 1-5 GM/200ML-% IV SOLN
1000.0000 mg | Freq: Once | INTRAVENOUS | Status: DC
  Filled 2015-06-30: qty 200

## 2015-06-30 MED ORDER — FENTANYL CITRATE (PF) 250 MCG/5ML IJ SOLN
INTRAMUSCULAR | Status: AC
  Filled 2015-06-30: qty 5

## 2015-06-30 MED ORDER — LACTATED RINGERS IV SOLN
INTRAVENOUS | Status: DC | PRN
  Administered 2015-06-30 (×2): via INTRAVENOUS

## 2015-06-30 MED ORDER — LACTATED RINGERS IV SOLN: INTRAVENOUS | Status: DC

## 2015-06-30 MED ORDER — MIDAZOLAM HCL 2 MG/2ML IJ SOLN: 2.0000 mg | INTRAMUSCULAR | Status: DC | PRN

## 2015-06-30 MED ORDER — HEPARIN SODIUM (PORCINE) 1000 UNIT/ML IJ SOLN
INTRAMUSCULAR | Status: AC
  Filled 2015-06-30: qty 1

## 2015-06-30 MED ORDER — CHLORHEXIDINE GLUCONATE 0.12 % MT SOLN
15.0000 mL | OROMUCOSAL | Status: AC
  Administered 2015-06-30: 15 mL via OROMUCOSAL
  Filled 2015-06-30: qty 15

## 2015-06-30 MED ORDER — SODIUM CHLORIDE 0.9 % IV SOLN: 250.0000 mL | INTRAVENOUS | Status: DC

## 2015-06-30 MED ORDER — SODIUM CHLORIDE 0.9 % IV SOLN
INTRAVENOUS | Status: DC
  Administered 2015-06-30: 2.6 [IU]/h via INTRAVENOUS
  Filled 2015-06-30 (×2): qty 2.5

## 2015-06-30 MED ORDER — INSULIN REGULAR BOLUS VIA INFUSION
0.0000 [IU] | Freq: Three times a day (TID) | INTRAVENOUS | Status: DC
  Filled 2015-06-30: qty 10

## 2015-06-30 MED ORDER — METOPROLOL TARTRATE 1 MG/ML IV SOLN
2.5000 mg | INTRAVENOUS | Status: DC | PRN
  Administered 2015-07-01 – 2015-07-02 (×2): 5 mg via INTRAVENOUS
  Filled 2015-06-30 (×2): qty 5

## 2015-06-30 MED ORDER — VANCOMYCIN HCL IN DEXTROSE 1-5 GM/200ML-% IV SOLN
1000.0000 mg | Freq: Once | INTRAVENOUS | Status: AC
  Administered 2015-06-30: 1000 mg via INTRAVENOUS
  Filled 2015-06-30: qty 200

## 2015-06-30 MED ORDER — ACETAMINOPHEN 160 MG/5ML PO SOLN
1000.0000 mg | Freq: Four times a day (QID) | ORAL | Status: DC
  Administered 2015-07-01 (×2): 1000 mg
  Filled 2015-06-30 (×2): qty 40.6

## 2015-06-30 MED ORDER — DEXTROSE 5 % IV SOLN
1.5000 g | Freq: Two times a day (BID) | INTRAVENOUS | Status: AC
  Administered 2015-06-30 – 2015-07-02 (×4): 1.5 g via INTRAVENOUS
  Filled 2015-06-30 (×4): qty 1.5

## 2015-06-30 MED ORDER — OXYCODONE HCL 5 MG PO TABS
5.0000 mg | ORAL_TABLET | ORAL | Status: DC | PRN
  Administered 2015-06-30 – 2015-07-07 (×17): 10 mg via ORAL
  Filled 2015-06-30 (×17): qty 2

## 2015-06-30 MED ORDER — POTASSIUM CHLORIDE 10 MEQ/50ML IV SOLN: 10.0000 meq | INTRAVENOUS | Status: AC

## 2015-06-30 MED ORDER — PHENYLEPHRINE HCL 10 MG/ML IJ SOLN
10.0000 mg | INTRAVENOUS | Status: DC | PRN
  Administered 2015-06-30 (×2): 20 ug/min via INTRAVENOUS

## 2015-06-30 MED ORDER — ALBUMIN HUMAN 5 % IV SOLN
250.0000 mL | INTRAVENOUS | Status: AC | PRN
  Administered 2015-06-30 (×2): 250 mL via INTRAVENOUS

## 2015-06-30 MED ORDER — PANTOPRAZOLE SODIUM 40 MG PO TBEC
40.0000 mg | DELAYED_RELEASE_TABLET | Freq: Every day | ORAL | Status: DC
  Administered 2015-07-02 – 2015-07-07 (×6): 40 mg via ORAL
  Filled 2015-06-30 (×6): qty 1

## 2015-06-30 MED ORDER — SODIUM CHLORIDE 0.9 % IV SOLN: INTRAVENOUS | Status: DC

## 2015-06-30 MED ORDER — ACETAMINOPHEN 160 MG/5ML PO SOLN: 650.0000 mg | Freq: Once | ORAL | Status: AC

## 2015-06-30 MED ORDER — SODIUM CHLORIDE 0.9 % IR SOLN
Status: DC | PRN
  Administered 2015-06-30: 4000 mL

## 2015-06-30 MED ORDER — BISACODYL 5 MG PO TBEC
10.0000 mg | DELAYED_RELEASE_TABLET | Freq: Every day | ORAL | Status: DC
  Administered 2015-07-01 – 2015-07-07 (×4): 10 mg via ORAL
  Filled 2015-06-30 (×7): qty 2

## 2015-06-30 MED ORDER — MORPHINE SULFATE (PF) 2 MG/ML IV SOLN
2.0000 mg | INTRAVENOUS | Status: DC | PRN
  Administered 2015-06-30 – 2015-07-01 (×6): 2 mg via INTRAVENOUS
  Filled 2015-06-30 (×4): qty 1
  Filled 2015-06-30: qty 2
  Filled 2015-06-30: qty 1

## 2015-06-30 MED ORDER — LACTATED RINGERS IV SOLN
INTRAVENOUS | Status: DC
  Administered 2015-07-01: 02:00:00 via INTRAVENOUS

## 2015-06-30 MED ORDER — PROTAMINE SULFATE 10 MG/ML IV SOLN
INTRAVENOUS | Status: DC | PRN
  Administered 2015-06-30 (×4): 50 mg via INTRAVENOUS
  Administered 2015-06-30: 80 mg via INTRAVENOUS
  Administered 2015-06-30: 40 mg via INTRAVENOUS
  Administered 2015-06-30: 80 mg via INTRAVENOUS

## 2015-06-30 MED ORDER — PHENYLEPHRINE HCL 10 MG/ML IJ SOLN
INTRAMUSCULAR | Status: DC | PRN
  Administered 2015-06-30 (×3): 80 ug via INTRAVENOUS

## 2015-06-30 MED ORDER — PROPOFOL 10 MG/ML IV BOLUS
INTRAVENOUS | Status: AC
  Filled 2015-06-30: qty 20

## 2015-06-30 MED ORDER — SODIUM BICARBONATE 8.4 % IV SOLN
50.0000 meq | Freq: Once | INTRAVENOUS | Status: AC
  Administered 2015-06-30: 50 meq via INTRAVENOUS
  Filled 2015-06-30: qty 50

## 2015-06-30 MED ORDER — BISACODYL 10 MG RE SUPP: 10.0000 mg | Freq: Every day | RECTAL | Status: DC

## 2015-06-30 MED ORDER — ALBUMIN HUMAN 5 % IV SOLN
INTRAVENOUS | Status: DC | PRN
  Administered 2015-06-30 (×2): via INTRAVENOUS

## 2015-06-30 MED ORDER — ROCURONIUM BROMIDE 50 MG/5ML IV SOLN
INTRAVENOUS | Status: AC
  Filled 2015-06-30: qty 2

## 2015-06-30 MED ORDER — ROCURONIUM BROMIDE 100 MG/10ML IV SOLN
INTRAVENOUS | Status: DC | PRN
  Administered 2015-06-30 (×3): 50 mg via INTRAVENOUS
  Administered 2015-06-30: 100 mg via INTRAVENOUS
  Administered 2015-06-30: 50 mg via INTRAVENOUS

## 2015-06-30 MED ORDER — MAGNESIUM SULFATE 4 GM/100ML IV SOLN
4.0000 g | Freq: Once | INTRAVENOUS | Status: AC
  Administered 2015-06-30: 4 g via INTRAVENOUS
  Filled 2015-06-30: qty 100

## 2015-06-30 MED ORDER — SODIUM CHLORIDE 0.9 % IV SOLN
200.0000 ug | INTRAVENOUS | Status: DC | PRN
  Administered 2015-06-30: 0.2 ug/kg/h via INTRAVENOUS

## 2015-06-30 MED ORDER — HEPARIN SODIUM (PORCINE) 1000 UNIT/ML IJ SOLN
INTRAMUSCULAR | Status: DC | PRN
  Administered 2015-06-30: 3 mL via INTRAVENOUS
  Administered 2015-06-30: 10 mL via INTRAVENOUS
  Administered 2015-06-30: 17 mL via INTRAVENOUS
  Administered 2015-06-30 (×2): 10 mL via INTRAVENOUS

## 2015-06-30 MED ORDER — SUCCINYLCHOLINE CHLORIDE 20 MG/ML IJ SOLN
INTRAMUSCULAR | Status: DC | PRN
  Administered 2015-06-30: 160 mg via INTRAVENOUS

## 2015-06-30 MED ORDER — ASPIRIN 81 MG PO CHEW
324.0000 mg | CHEWABLE_TABLET | Freq: Every day | ORAL | Status: DC
Start: 2015-07-01 — End: 2015-07-01

## 2015-06-30 MED ORDER — MORPHINE SULFATE (PF) 2 MG/ML IV SOLN
1.0000 mg | INTRAVENOUS | Status: DC | PRN
  Administered 2015-06-30: 4 mg via INTRAVENOUS
  Filled 2015-06-30: qty 1

## 2015-06-30 MED ORDER — LACTATED RINGERS IV SOLN
INTRAVENOUS | Status: DC | PRN
  Administered 2015-06-30 (×2): via INTRAVENOUS

## 2015-06-30 MED ORDER — DOCUSATE SODIUM 100 MG PO CAPS
200.0000 mg | ORAL_CAPSULE | Freq: Every day | ORAL | Status: DC
  Administered 2015-07-01 – 2015-07-07 (×5): 200 mg via ORAL
  Filled 2015-06-30 (×7): qty 2

## 2015-06-30 MED ORDER — SODIUM CHLORIDE 0.9 % IJ SOLN: 3.0000 mL | INTRAMUSCULAR | Status: DC | PRN

## 2015-06-30 MED ORDER — SODIUM CHLORIDE 0.45 % IV SOLN
INTRAVENOUS | Status: DC | PRN
  Administered 2015-06-30: 20 mL via INTRAVENOUS

## 2015-06-30 MED ORDER — AMINOCAPROIC ACID 250 MG/ML IV SOLN
10.0000 g | INTRAVENOUS | Status: DC | PRN
  Administered 2015-06-30: 5 g/h via INTRAVENOUS

## 2015-06-30 MED ORDER — ONDANSETRON HCL 4 MG/2ML IJ SOLN
4.0000 mg | Freq: Four times a day (QID) | INTRAMUSCULAR | Status: DC | PRN
  Administered 2015-07-01 (×2): 4 mg via INTRAVENOUS
  Filled 2015-06-30 (×2): qty 2

## 2015-06-30 MED ORDER — SODIUM CHLORIDE 0.9 % IV SOLN
INTRAVENOUS | Status: DC
  Administered 2015-06-30: 15:00:00 via INTRAVENOUS

## 2015-06-30 MED ORDER — HEMOSTATIC AGENTS (NO CHARGE) OPTIME
TOPICAL | Status: DC | PRN
  Administered 2015-06-30 (×3): 1

## 2015-06-30 MED ORDER — DEXMEDETOMIDINE HCL IN NACL 200 MCG/50ML IV SOLN
0.0000 ug/kg/h | INTRAVENOUS | Status: DC
  Administered 2015-06-30: 0.5 ug/kg/h via INTRAVENOUS
  Filled 2015-06-30 (×2): qty 50

## 2015-06-30 MED ORDER — ACETAMINOPHEN 500 MG PO TABS
1000.0000 mg | ORAL_TABLET | Freq: Four times a day (QID) | ORAL | Status: AC
Start: 2015-07-01 — End: 2015-07-05
  Administered 2015-07-01 – 2015-07-05 (×15): 1000 mg via ORAL
  Filled 2015-06-30 (×22): qty 2

## 2015-06-30 MED ORDER — DEXMEDETOMIDINE HCL IN NACL 200 MCG/50ML IV SOLN
INTRAVENOUS | Status: AC
  Filled 2015-06-30: qty 50

## 2015-06-30 MED ORDER — FAMOTIDINE IN NACL 20-0.9 MG/50ML-% IV SOLN
20.0000 mg | Freq: Two times a day (BID) | INTRAVENOUS | Status: AC
  Administered 2015-06-30 (×2): 20 mg via INTRAVENOUS
  Filled 2015-06-30: qty 50

## 2015-06-30 MED ORDER — TRAMADOL HCL 50 MG PO TABS
50.0000 mg | ORAL_TABLET | ORAL | Status: DC | PRN
  Administered 2015-07-01 – 2015-07-07 (×8): 100 mg via ORAL
  Filled 2015-06-30 (×9): qty 2

## 2015-06-30 MED ORDER — MIDAZOLAM HCL 10 MG/2ML IJ SOLN
INTRAMUSCULAR | Status: AC
  Filled 2015-06-30: qty 4

## 2015-06-30 MED ORDER — FENTANYL CITRATE (PF) 100 MCG/2ML IJ SOLN
INTRAMUSCULAR | Status: DC | PRN
  Administered 2015-06-30: 100 ug via INTRAVENOUS
  Administered 2015-06-30: 50 ug via INTRAVENOUS
  Administered 2015-06-30 (×3): 250 ug via INTRAVENOUS
  Administered 2015-06-30: 50 ug via INTRAVENOUS
  Administered 2015-06-30 (×2): 150 ug via INTRAVENOUS

## 2015-06-30 MED ORDER — PROPOFOL 10 MG/ML IV BOLUS
INTRAVENOUS | Status: DC | PRN
  Administered 2015-06-30: 150 mg via INTRAVENOUS

## 2015-06-30 MED ORDER — LIDOCAINE HCL (CARDIAC) 20 MG/ML IV SOLN
INTRAVENOUS | Status: DC | PRN
  Administered 2015-06-30: 80 mg via INTRAVENOUS

## 2015-06-30 MED ORDER — SODIUM CHLORIDE 0.9 % IJ SOLN
3.0000 mL | Freq: Two times a day (BID) | INTRAMUSCULAR | Status: DC
  Administered 2015-07-01 – 2015-07-06 (×9): 3 mL via INTRAVENOUS

## 2015-06-30 MED ORDER — ASPIRIN EC 325 MG PO TBEC
325.0000 mg | DELAYED_RELEASE_TABLET | Freq: Every day | ORAL | Status: DC
  Administered 2015-07-01 – 2015-07-05 (×5): 325 mg via ORAL
  Filled 2015-06-30 (×5): qty 1

## 2015-06-30 MED ORDER — ARTIFICIAL TEARS OP OINT
TOPICAL_OINTMENT | OPHTHALMIC | Status: DC | PRN
Start: 2015-06-30 — End: 2015-06-30
  Administered 2015-06-30: 1 via OPHTHALMIC

## 2015-06-30 MED ORDER — EPHEDRINE SULFATE 50 MG/ML IJ SOLN
INTRAMUSCULAR | Status: DC | PRN
  Administered 2015-06-30: 5 mg via INTRAVENOUS

## 2015-06-30 MED ORDER — ROCURONIUM BROMIDE 50 MG/5ML IV SOLN
INTRAVENOUS | Status: AC
  Filled 2015-06-30: qty 1

## 2015-06-30 MED FILL — Heparin Sodium (Porcine) Inj 1000 Unit/ML: INTRAMUSCULAR | Qty: 30 | Status: AC

## 2015-06-30 MED FILL — Potassium Chloride Inj 2 mEq/ML: INTRAVENOUS | Qty: 40 | Status: AC

## 2015-06-30 MED FILL — Magnesium Sulfate Inj 50%: INTRAMUSCULAR | Qty: 10 | Status: AC

## 2015-06-30 SURGICAL SUPPLY — 158 items
ADAPTER CARDIO PERF ANTE/RETRO (ADAPTER) ×3 IMPLANT
ADH SKN CLS APL DERMABOND .7 (GAUZE/BANDAGES/DRESSINGS) ×2
ADPR PRFSN 84XANTGRD RTRGD (ADAPTER) ×2
APPLICATOR COTTON TIP 6IN STRL (MISCELLANEOUS) IMPLANT
ATRICLIPPRO ×3 IMPLANT
ATTRACTOMAT 16X20 MAGNETIC DRP (DRAPES) ×3 IMPLANT
BAG DECANTER FOR FLEXI CONT (MISCELLANEOUS) ×3 IMPLANT
BANDAGE ELASTIC 4 VELCRO ST LF (GAUZE/BANDAGES/DRESSINGS) ×3 IMPLANT
BANDAGE ELASTIC 6 VELCRO ST LF (GAUZE/BANDAGES/DRESSINGS) ×3 IMPLANT
BASKET HEART (ORDER IN 25'S) (MISCELLANEOUS) ×1
BASKET HEART (ORDER IN 25S) (MISCELLANEOUS) ×2 IMPLANT
BLADE STERNUM SYSTEM 6 (BLADE) ×3 IMPLANT
BLADE SURG 11 STRL SS (BLADE) ×6 IMPLANT
BLADE SURG ROTATE 9660 (MISCELLANEOUS) IMPLANT
BNDG GAUZE ELAST 4 BULKY (GAUZE/BANDAGES/DRESSINGS) ×3 IMPLANT
CABLE PACING FASLOC BIEGE (MISCELLANEOUS) ×3 IMPLANT
CANISTER SUCTION 2500CC (MISCELLANEOUS) ×3 IMPLANT
CANN PRFSN 3/8X14X24FR PCFC (MISCELLANEOUS)
CANN PRFSN 3/8XCNCT ST RT ANG (MISCELLANEOUS)
CANNULA EZ GLIDE AORTIC 21FR (CANNULA) ×3 IMPLANT
CANNULA FEM VENOUS REMOTE 22FR (CANNULA) ×3 IMPLANT
CANNULA GUNDRY RCSP 15FR (MISCELLANEOUS) ×3 IMPLANT
CANNULA PRFSN 3/8X14X24FR PCFC (MISCELLANEOUS) IMPLANT
CANNULA PRFSN 3/8XCNCT RT ANG (MISCELLANEOUS) IMPLANT
CANNULA SUMP PERICARDIAL (CANNULA) ×3 IMPLANT
CANNULA VEN MTL TIP RT (MISCELLANEOUS)
CANNULA VENNOUS METAL TIP 20FR (CANNULA) ×3 IMPLANT
CANNULA VRC MALLEABLE 20FR (CANNULA) ×3 IMPLANT
CATH CPB KIT OWEN (MISCELLANEOUS) ×3 IMPLANT
CATH FOLEY 2WAY SLVR  5CC 14FR (CATHETERS)
CATH FOLEY 2WAY SLVR 5CC 14FR (CATHETERS) IMPLANT
CATH THORACIC 28FR RT ANG (CATHETERS) ×3 IMPLANT
CATH THORACIC 36FR (CATHETERS) ×3 IMPLANT
CLAMP ISOLATOR SYNERGY LG (MISCELLANEOUS) ×3 IMPLANT
CLIP FOGARTY SPRING 6M (CLIP) IMPLANT
CLIP RETRACTION 3.0MM CORONARY (MISCELLANEOUS) ×3 IMPLANT
CLIP TI MEDIUM 24 (CLIP) IMPLANT
CLIP TI WIDE RED SMALL 24 (CLIP) IMPLANT
CONN 1/2X1/2X1/2  BEN (MISCELLANEOUS) ×1
CONN 1/2X1/2X1/2 BEN (MISCELLANEOUS) ×2 IMPLANT
CONN 3/8X1/2 ST GISH (MISCELLANEOUS) ×6 IMPLANT
CONN ST 1/4X3/8  BEN (MISCELLANEOUS) ×1
CONN ST 1/4X3/8 BEN (MISCELLANEOUS) ×2 IMPLANT
COVER MAYO STAND STRL (DRAPES) ×3 IMPLANT
COVER PROBE W GEL 5X96 (DRAPES) ×3 IMPLANT
COVER SURGICAL LIGHT HANDLE (MISCELLANEOUS) ×6 IMPLANT
CRADLE DONUT ADULT HEAD (MISCELLANEOUS) ×3 IMPLANT
DERMABOND ADVANCED (GAUZE/BANDAGES/DRESSINGS) ×1
DERMABOND ADVANCED .7 DNX12 (GAUZE/BANDAGES/DRESSINGS) ×2 IMPLANT
DRAIN CHANNEL 32F RND 10.7 FF (WOUND CARE) ×3 IMPLANT
DRAPE BILATERAL SPLIT (DRAPES) IMPLANT
DRAPE CARDIOVASCULAR INCISE (DRAPES) ×3
DRAPE CV SPLIT W-CLR ANES SCRN (DRAPES) IMPLANT
DRAPE INCISE IOBAN 66X45 STRL (DRAPES) ×9 IMPLANT
DRAPE SLUSH/WARMER DISC (DRAPES) ×6 IMPLANT
DRAPE SRG 135X102X78XABS (DRAPES) ×2 IMPLANT
DRSG AQUACEL AG ADV 3.5X14 (GAUZE/BANDAGES/DRESSINGS) ×3 IMPLANT
DRSG COVADERM 4X14 (GAUZE/BANDAGES/DRESSINGS) ×3 IMPLANT
ELECT BLADE 4.0 EZ CLEAN MEGAD (MISCELLANEOUS) ×3
ELECT REM PT RETURN 9FT ADLT (ELECTROSURGICAL) ×3
ELECTRODE BLDE 4.0 EZ CLN MEGD (MISCELLANEOUS) ×2 IMPLANT
ELECTRODE REM PT RTRN 9FT ADLT (ELECTROSURGICAL) ×2 IMPLANT
GAUZE SPONGE 4X4 12PLY STRL (GAUZE/BANDAGES/DRESSINGS) ×12 IMPLANT
GLOVE BIO SURGEON STRL SZ 6 (GLOVE) ×9 IMPLANT
GLOVE BIO SURGEON STRL SZ 6.5 (GLOVE) ×9 IMPLANT
GLOVE BIO SURGEON STRL SZ7 (GLOVE) ×9 IMPLANT
GLOVE BIO SURGEON STRL SZ7.5 (GLOVE) ×9 IMPLANT
GLOVE ORTHO TXT STRL SZ7.5 (GLOVE) ×6 IMPLANT
GOWN STRL REUS W/ TWL LRG LVL3 (GOWN DISPOSABLE) ×16 IMPLANT
GOWN STRL REUS W/TWL LRG LVL3 (GOWN DISPOSABLE) ×24
HEMOSTAT POWDER SURGIFOAM 1G (HEMOSTASIS) ×9 IMPLANT
INSERT FOGARTY XLG (MISCELLANEOUS) ×6 IMPLANT
KIT BASIN OR (CUSTOM PROCEDURE TRAY) ×3 IMPLANT
KIT DILATOR VASC 18G NDL (KITS) ×3 IMPLANT
KIT DRAINAGE VACCUM ASSIST (KITS) ×3 IMPLANT
KIT ROOM TURNOVER OR (KITS) ×3 IMPLANT
KIT SUCTION CATH 14FR (SUCTIONS) ×6 IMPLANT
KIT VASOVIEW W/TROCAR VH 2000 (KITS) ×3 IMPLANT
LEAD PACING MYOCARDI (MISCELLANEOUS) ×3 IMPLANT
LINE VENT (MISCELLANEOUS) ×3 IMPLANT
LOOP VESSEL SUPERMAXI WHITE (MISCELLANEOUS) ×6 IMPLANT
MARKER GRAFT CORONARY BYPASS (MISCELLANEOUS) ×9 IMPLANT
NS IRRIG 1000ML POUR BTL (IV SOLUTION) ×18 IMPLANT
PACK OPEN HEART (CUSTOM PROCEDURE TRAY) ×3 IMPLANT
PAD ARMBOARD 7.5X6 YLW CONV (MISCELLANEOUS) ×9 IMPLANT
PAD ELECT DEFIB RADIOL ZOLL (MISCELLANEOUS) ×3 IMPLANT
PENCIL BUTTON HOLSTER BLD 10FT (ELECTRODE) ×3 IMPLANT
PROBE CRYO2-ABLATION MALLABLE (MISCELLANEOUS) ×3 IMPLANT
PUNCH AORTIC ROTATE 4.0MM (MISCELLANEOUS) IMPLANT
PUNCH AORTIC ROTATE 4.5MM 8IN (MISCELLANEOUS) IMPLANT
PUNCH AORTIC ROTATE 5MM 8IN (MISCELLANEOUS) IMPLANT
SET CARDIOPLEGIA MPS 5001102 (MISCELLANEOUS) ×3 IMPLANT
SET IRRIG TUBING LAPAROSCOPIC (IRRIGATION / IRRIGATOR) ×3 IMPLANT
SOLUTION ANTI FOG 6CC (MISCELLANEOUS) ×3 IMPLANT
SPONGE GAUZE 4X4 12PLY STER LF (GAUZE/BANDAGES/DRESSINGS) ×3 IMPLANT
SPONGE LAP 18X18 X RAY DECT (DISPOSABLE) IMPLANT
SPONGE LAP 4X18 X RAY DECT (DISPOSABLE) IMPLANT
SUCKER INTRACARDIAC WEIGHTED (SUCKER) ×3 IMPLANT
SUT BONE WAX W31G (SUTURE) ×3 IMPLANT
SUT ETHIBOND 2 0 SH (SUTURE) ×12 IMPLANT
SUT ETHIBOND 2 0 SH 36X2 (SUTURE) ×12 IMPLANT
SUT ETHIBOND 2 0 V4 (SUTURE) IMPLANT
SUT ETHIBOND 2 0V4 GREEN (SUTURE) IMPLANT
SUT ETHIBOND 4 0 TF (SUTURE) IMPLANT
SUT ETHIBOND 5 0 C 1 30 (SUTURE) ×3 IMPLANT
SUT ETHIBOND X763 2 0 SH 1 (SUTURE) ×12 IMPLANT
SUT MNCRL AB 3-0 PS2 18 (SUTURE) ×12 IMPLANT
SUT MNCRL AB 4-0 PS2 18 (SUTURE) IMPLANT
SUT PDS AB 1 CTX 36 (SUTURE) ×12 IMPLANT
SUT PROLENE 2 0 SH DA (SUTURE) IMPLANT
SUT PROLENE 3 0 SH 1 (SUTURE) ×6 IMPLANT
SUT PROLENE 3 0 SH DA (SUTURE) ×9 IMPLANT
SUT PROLENE 3 0 SH1 36 (SUTURE) IMPLANT
SUT PROLENE 4 0 RB 1 (SUTURE) ×4
SUT PROLENE 4 0 SH DA (SUTURE) ×6 IMPLANT
SUT PROLENE 4-0 RB1 .5 CRCL 36 (SUTURE) ×8 IMPLANT
SUT PROLENE 5 0 C 1 36 (SUTURE) ×6 IMPLANT
SUT PROLENE 6 0 C 1 30 (SUTURE) ×12 IMPLANT
SUT PROLENE 7.0 RB 3 (SUTURE) ×9 IMPLANT
SUT PROLENE 8 0 BV175 6 (SUTURE) ×21 IMPLANT
SUT PROLENE BLUE 7 0 (SUTURE) ×3 IMPLANT
SUT PROLENE POLY MONO (SUTURE) IMPLANT
SUT SILK  1 MH (SUTURE) ×6
SUT SILK 1 MH (SUTURE) ×12 IMPLANT
SUT SILK 1 TIES 10X30 (SUTURE) ×3 IMPLANT
SUT SILK 2 0 SH CR/8 (SUTURE) ×6 IMPLANT
SUT SILK 2 0 TIES 10X30 (SUTURE) ×3 IMPLANT
SUT SILK 2 0 TIES 17X18 (SUTURE) ×3
SUT SILK 2-0 18XBRD TIE BLK (SUTURE) ×2 IMPLANT
SUT SILK 3 0 SH CR/8 (SUTURE) ×3 IMPLANT
SUT SILK 4 0 TIE 10X30 (SUTURE) ×6 IMPLANT
SUT STEEL 6MS V (SUTURE) IMPLANT
SUT STEEL STERNAL CCS#1 18IN (SUTURE) IMPLANT
SUT STEEL SZ 6 DBL 3X14 BALL (SUTURE) IMPLANT
SUT TEM PAC WIRE 2 0 SH (SUTURE) ×15 IMPLANT
SUT VIC AB 1 CTX 18 (SUTURE) ×3 IMPLANT
SUT VIC AB 1 CTX 36 (SUTURE)
SUT VIC AB 1 CTX36XBRD ANBCTR (SUTURE) IMPLANT
SUT VIC AB 2-0 CT1 27 (SUTURE) ×3
SUT VIC AB 2-0 CT1 TAPERPNT 27 (SUTURE) ×2 IMPLANT
SUT VIC AB 2-0 CTX 27 (SUTURE) ×6 IMPLANT
SUT VIC AB 3-0 SH 27 (SUTURE)
SUT VIC AB 3-0 SH 27X BRD (SUTURE) IMPLANT
SUT VIC AB 3-0 X1 27 (SUTURE) ×6 IMPLANT
SUT VICRYL 4-0 PS2 18IN ABS (SUTURE) IMPLANT
SUTURE E-PAK OPEN HEART (SUTURE) ×3 IMPLANT
SYS ARTICLIP LAA EXCLUSION 150 (Clip) ×3 IMPLANT
SYS ATRICLIP LAA EXCLUSION 45 (CLIP) IMPLANT
SYSTEM SAHARA CHEST DRAIN ATS (WOUND CARE) ×3 IMPLANT
TAPE CLOTH SURG 4X10 WHT LF (GAUZE/BANDAGES/DRESSINGS) ×3 IMPLANT
TOWEL OR 17X24 6PK STRL BLUE (TOWEL DISPOSABLE) ×6 IMPLANT
TOWEL OR 17X26 10 PK STRL BLUE (TOWEL DISPOSABLE) ×6 IMPLANT
TRAY FOLEY IC TEMP SENS 14FR (CATHETERS) ×3 IMPLANT
TRAY FOLEY IC TEMP SENS 16FR (CATHETERS) ×6 IMPLANT
TUBING INSUFFLATION (TUBING) ×3 IMPLANT
TUBING INSUFFLATION 10FT LAP (TUBING) ×3 IMPLANT
UNDERPAD 30X30 INCONTINENT (UNDERPADS AND DIAPERS) ×6 IMPLANT
WATER STERILE IRR 1000ML POUR (IV SOLUTION) ×12 IMPLANT

## 2015-06-30 NOTE — Anesthesia Preprocedure Evaluation (Addendum)
Anesthesia Evaluation  Patient identified by MRN, date of birth, ID band Patient awake    Reviewed: Allergy & Precautions, NPO status   History of Anesthesia Complications (+) PONV  Airway Mallampati: II  TM Distance: >3 FB Neck ROM: Full    Dental  (+) Teeth Intact, Dental Advisory Given   Pulmonary shortness of breath, asthma , sleep apnea , pneumonia, former smoker,    breath sounds clear to auscultation       Cardiovascular hypertension, Pt. on medications + angina + CAD and +CHF  + dysrhythmias Atrial Fibrillation  Rhythm:Regular Rate:Normal     Neuro/Psych Anxiety Depression    GI/Hepatic Neg liver ROS, hiatal hernia, GERD  Medicated and Controlled,  Endo/Other  negative endocrine ROS  Renal/GU negative Renal ROS     Musculoskeletal   Abdominal   Peds  Hematology   Anesthesia Other Findings   Reproductive/Obstetrics                          Anesthesia Physical Anesthesia Plan  ASA: IV  Anesthesia Plan: General   Post-op Pain Management:    Induction: Intravenous  Airway Management Planned: Oral ETT  Additional Equipment: Arterial line and PA Cath  Intra-op Plan:   Post-operative Plan: Post-operative intubation/ventilation  Informed Consent: I have reviewed the patients History and Physical, chart, labs and discussed the procedure including the risks, benefits and alternatives for the proposed anesthesia with the patient or authorized representative who has indicated his/her understanding and acceptance.   Dental advisory given  Plan Discussed with: Anesthesiologist and CRNA  Anesthesia Plan Comments:        Anesthesia Quick Evaluation

## 2015-06-30 NOTE — Anesthesia Procedure Notes (Addendum)
Procedure Name: Intubation Date/Time: 06/30/2015 8:14 AM Performed by: Raphael Gibney T Pre-anesthesia Checklist: Patient identified, Timeout performed, Emergency Drugs available, Suction available and Patient being monitored Patient Re-evaluated:Patient Re-evaluated prior to inductionOxygen Delivery Method: Circle system utilized and Simple face mask Preoxygenation: Pre-oxygenation with 100% oxygen Intubation Type: IV induction Ventilation: Two handed mask ventilation required Laryngoscope Size: Mac and 4 Grade View: Grade II Tube type: Subglottic suction tube Tube size: 8.0 mm Number of attempts: 1 Airway Equipment and Method: Patient positioned with wedge pillow and Stylet Placement Confirmation: ETT inserted through vocal cords under direct vision,  positive ETCO2 and breath sounds checked- equal and bilateral Secured at: 22 cm Tube secured with: Tape Dental Injury: Teeth and Oropharynx as per pre-operative assessment     Procedures: Right IJ Gordy Councilman Catheter Insertion: [id number]: The patient was identified and consent obtained.  TO was performed, and full barrier precautions were used.  The skin was anesthetized with lidocaine-4cc plain with 25g needle.  Once the vein was located with the 22 ga. needle using ultrasound guidance , the wire was inserted into the vein.  The wire location was confirmed with ultrasound.  The tissue was dilated and the 8.5 [place] cordis catheter was carefully inserted. Afterwards Gordy Councilman catheter was inserted. PA catheter at 48cm.  The patient tolerated the procedure well.

## 2015-06-30 NOTE — OR Nursing (Signed)
SICU notified of 20 min call  

## 2015-06-30 NOTE — Op Note (Signed)
CARDIOTHORACIC SURGERY OPERATIVE NOTE  Date of Procedure: 06/30/2015  Preoperative Diagnosis:   Recurrent Paroxysmal Atrial Fibrillation  Severe Single-vessel Coronary Artery Disease  Postoperative Diagnosis: Same  Procedure:    Maze Procedure   complete bilateral atrial lesion set using bipolar radiofrequency and cryothermy ablation  clipping of left atrial appendage (Atriclip size 74mm)   Coronary Artery Bypass Grafting x 2   Left Internal Mammary Artery to First Diagonal Branch and Distal Left Anterior Descending Coronary Artery, Sequentially  Surgeon: Valentina Gu. Roxy Manns, MD  Assistant: John Giovanni, PA-C  Anesthesia: Finis Bud, MD  Operative Findings:  Normal LV systolic function  Mild mitral regurgitation  Moderate pulmonary hypertension  Thick walled left atrium and pulmonary veins    BRIEF CLINICAL NOTE AND INDICATIONS FOR SURGERY  Patient is a 54 year old morbidly obese male with history of hypertension, chronic diastolic congestive heart failure, recurrent paroxysmal atrial fibrillation, anxiety, and depression who has been referred for surgical consultation to discuss treatment options for management of atrial fibrillation refractory to medical therapy and previous catheter-based ablation 2. The patient first developed recurrent paroxysmal atrial fibrillation in 2014. Transthoracic echocardiogram performed at that time demonstrated normal left ventricular systolic function. Patient underwent an exercise treadmill stress test that was felt to be low risk. He was initially treated with metoprolol and anticoagulated using aspirin alone. And he was referred for a sleep study and diagnosed with obstructive sleep apnea. He has been using CPAP ever since. He continued to suffer from recurrent episodes of paroxysmal atrial fibrillation that made him very symptomatic. He was referred to the atrial fibrillation clinic treated for a period of time with flecainide and  later Multaq, but he was intolerant of both. In 2015 he was hospitalized with acute exacerbation of chronic diastolic congestive heart failure associated with recurrent atrial fibrillation and epigastric chest pain. A stress my view exam was performed at that time that was felt to be low risk. He was loaded with Tikosyn but continued to experience recurrent episodes of paroxysmal atrial fibrillation that were associated with shortness of breath, diaphoresis, fatigue, and atypical chest pain. He underwent catheter-based ablation for atrial fibrillation on 2 occasions in February and again in May of this year, but each time he quickly developed recurrent episodes of paroxysmal atrial fibrillation. He was last seen in follow-up by [name] on 04/18/2015. At that time Tikosyn was discontinued and the patient was started on amiodarone. The patient has continued to experience intermittent episodes of atrial fibrillation on amiodarone therapy, although reportedly his heart rate has been under better control and symptoms have been tolerated somewhat better. The patient has now been referred for surgical consultation to discuss the possibility of maze procedure.  Diagnostic cardiac catheterization revealed severe single vessel coronary artery disease.  The patient has been seen in consultation and counseled at length regarding the indications, risks and potential benefits of surgery.  All questions have been answered, and the patient provides full informed consent for the operation as described.     DETAILS OF THE OPERATIVE PROCEDURE  Preparation:  The patient is brought to the operating room on the above mentioned date and central monitoring was established by the anesthesia team including placement of Swan-Ganz catheter and radial arterial line. There was moderate pulmonary hypertension.  The patient is placed in the supine position on the operating table.  Intravenous antibiotics are administered. General  endotracheal anesthesia is induced uneventfully. A Foley catheter is placed.  Baseline transesophageal echocardiogram was performed.  Findings were  notable for normal LV systolic function.  There was type I mitral valve dysfunction with mild (1+/2+) mitral regurgitation.  The patient's chest, abdomen, both groins, and both lower extremities are prepared and draped in a sterile manner. A time out procedure is performed.   Surgical Approach and Conduit Harvest:  A median sternotomy incision was performed and the left internal mammary artery is dissected from the chest wall and prepared for bypass grafting. The left internal mammary artery is notably good quality conduit. Following systemic heparinization, the left internal mammary artery was transected distally noted to have excellent flow.   Extracorporeal Cardiopulmonary Bypass and Myocardial Protection:  The pericardium is opened.  The ascending aorta is normal in appearance.  The right common femoral vein is cannulated using the Seldinger technique and a guidewire advanced into the right atrium using TEE guidance.  The patient is heparinized systemically and the femoral vein cannulated using a 22 Fr long femoral venous cannula.  The ascending aorta is cannulated for cardiopulmonary bypass.  Adequate heparinization is verified.   A retrograde cardioplegia cannula is placed through the right atrium into the coronary sinus.  The entire pre-bypass portion of the operation was notable for stable hemodynamics.  Cardiopulmonary bypass was begun and the surface of the heart is inspected.  A second venous cannula is placed through the right atrium into the superior vena cava.   A cardioplegia cannula is placed in the ascending aorta.  A temperature probe was placed in the interventricular septum.  The patient is cooled to 32C systemic temperature.  The aortic cross clamp is applied and cold blood cardioplegia is delivered initially in an antegrade  fashion through the aortic root.   Supplemental cardioplegia is given retrograde through the coronary sinus catheter.  Iced saline slush is applied for topical hypothermia.  The initial cardioplegic arrest is rapid with early diastolic arrest.  Repeat doses of cardioplegia are administered intermittently throughout the entire cross clamp portion of the operation through the aortic root and through the coronary sinus catheter in order to maintain completely flat electrocardiogram and septal myocardial temperature below 15C.  Myocardial protection was felt to be excellent.   Coronary Artery Bypass Grafting:   The first diagonal branch of the left anterior descending coronary artery was grafted using the left internal mammary artery in an side-to-side fashion.  At the site of distal anastomosis the target vessel was good quality and measured approximately 1.8 mm in diameter.  The distal left anterior coronary artery was grafted with the left internal mammary artery in an end-to-side fashion.  At the site of distal anastomosis the target vessel was good quality and measured approximately 2.0 mm in diameter.   Maze Procedure (left atrial lesion set):  The AtriCure Synergy bipolar radiofrequency ablation clamp is used for all radiofrequency ablation lesions for the maze procedure.  The Atricure CryoICE nitrous oxide cryothermy system is utilized for all cryothermy ablation lesions.   The heart is retracted towards the surgeon's side and the left sided pulmonary veins exposed.  An elliptical ablation lesion is created around the base of the left sided pulmonary veins.  A similar elliptical lesion was created around the base of the left atrial appendage.  The left atrial appendage was obliterated using an Atricure left atrial appendage clip (Atriclip, size 27mm).  The heart was replaced into the pericardial sac.  A left atriotomy incision was performed through the interatrial groove and extended  partially across the back wall of the left atrium after opening  the oblique sinus inferiorly.  The floor of the left atrium and the mitral valve were exposed using a self-retaining retractor.    An ablation lesion was placed around the right sided pulmonary veins using the bipolar clamp with one limb of the clamp along the endocardial surface and one along the epicardial surface posteriorly.  A bipolar ablation lesion was placed across the dome of the left atrium from the cephalad apex of the atriotomy incision to reach the cephalad apex of the elliptical lesion around the left sided pulmonary veins.  A similar bipolar lesion was placed across the back wall of the left atrium from the caudad apex of the atriotomy incision to reach the caudad apex of the elliptical lesion around the left sided pulmonary veins, thereby completing a box.  Finally another bipolar lesion was placed across the back wall of the left atrium from the caudad apex of the atriotomy incision towards the posterior mitral valve annulus.  This lesion was completed along the endocardial surface onto the posterior mitral annulus with a 3 minute duration cryothermy lesion, followed by a second cryothermy lesion along the posterior epicardial surface of the left atrium to the coronary sinus.  This completes the entire left side lesion set of the Cox maze procedure.  The atriotomy was closed using a 2-layer closure of running 3-0 Prolene suture after placing a sump drain across the mitral valve to serve as a left ventricular vent.  The septal myocardial temperature rose rapidly following reperfusion of the left internal mammary artery.  One final dose of warm retrograde "hot shot" cardioplegia was administered retrograde through the coronary sinus catheter while all air was evacuated through the aortic root.  The aortic cross clamp was removed after a total cross clamp time of 127 minutes.   Maze Procedure (right atrial lesion set):  The  retrograde cardioplegia cannula was removed and the small hole in the right atrium extended a short distance.  The AtriCure Synergy bipolar radiofrequency ablation clamp is utilized to create a series of linear lesions in the right atrium, each with one limb of the clamp along the endocardial surface and the other along the epicardial surface. The first lesion is placed from the posterior apex of the atriotomy incision and along the lateral wall of the right atrium to reach the lateral aspect of the superior vena cava. A second lesion is placed in the opposite direction from the posterior apex of the atriotomy incision along the lateral wall to reach the lateral aspect of the inferior vena cava. A third lesion is placed from the midportion of the atriotomy incision extending at a right angle to reach the tip of the right atrial appendage. A fourth lesion is placed from the anterior apex of the atriotomy incision in an anterior and inferior direction to reach the acute margin of the heart. Finally, the cryotherapy probe is utilized to complete the right atrial lesion set by placing the probe along the endocardial surface of the right atrium from the anterior apex of the atriotomy incision to reach the tricuspid annulus at the 2:00 position. The right atriotomy incision is closed with a 2 layer closure of running 4-0 Prolene suture.   Procedure Completion:  Epicardial pacing wires are fixed to the right ventricular outflow tract and to the right atrial appendage. The patient is rewarmed to 37C temperature. The aortic and left ventricular vents are removed.  The patient is weaned and disconnected from cardiopulmonary bypass.  The patient's rhythm at separation  from bypass was sinus.  The patient was weaned from cardioplegic bypass without any inotropic support. Total cardiopulmonary bypass time for the operation was 166 minutes.  Followup transesophageal echocardiogram performed after separation from bypass  revealed no significant changes in comparison with preoperatively.  The aortic and superior vena cava cannula were removed uneventfully. Protamine was administered to reverse the anticoagulation. The femoral venous cannula was removed and manual pressure held on the groin for 30 minutes.  The mediastinum and pleural space were inspected for hemostasis and irrigated with saline solution. The mediastinum and the left pleural space were drained using 3 chest tubes placed through separate stab incisions inferiorly.  The soft tissues anterior to the aorta were reapproximated loosely. The sternum is closed with double strength sternal wire. The soft tissues anterior to the sternum were closed in multiple layers and the skin is closed with a running subcuticular skin closure.  The post-bypass portion of the operation was notable for stable rhythm and hemodynamics.   No blood products were administered during the operation.   Patient Disposition:  The patient tolerated the procedure well and is transported to the surgical intensive care in stable condition. There are no intraoperative complications. All sponge instrument and needle counts are verified correct at completion of the operation.    Valentina Gu. Roxy Manns MD 06/30/2015 2:24 PM

## 2015-06-30 NOTE — Progress Notes (Signed)
[facility] BRIEF SICU PROGRESS NOTE  Day of Surgery  S/P Procedure(s) (LRB): CORONARY ARTERY BYPASS GRAFTING (CABG)X2 SEQ LIMA-DIAG-LAD (N/A) TRANSESOPHAGEAL ECHOCARDIOGRAM (TEE) (N/A) MAZE (N/A)   Waking up on vent, follows commands NSR w/ stable hemodynamics although BP somewhat labile O2 sats 93-95% on 50% FiO2 Chest tube output low UOP excellent Labs okay although respiratory acidosis on initial ABG CXR w/ low lung volumes, otherwise clear  Plan: Recruitment maneuver then increase vent rate and repeat ABG prior to initiation of weaning process.  Otherwise routine early postop  Rexene Alberts, MD 06/30/2015 3:58 PM

## 2015-06-30 NOTE — Progress Notes (Signed)
Notified Dr. Roxy Manns of repeat ABG; instructed to give 1amp sodium bicarb and start to wean.  VSS will continue to monitor.

## 2015-06-30 NOTE — Brief Op Note (Addendum)
06/30/2015  12:47 PM      [address]       [place] [address]             [phone]     06/30/2015  12:48 PM  PATIENT:  Harold Scott  54 y.o. male  PRE-OPERATIVE DIAGNOSIS:  CAD AFIB MR  POST-OPERATIVE DIAGNOSIS:  CAD AFIB  PROCEDURE:  Procedure(s): CORONARY ARTERY BYPASS GRAFTING (CABG)X2 SEQ LIMA-DIAG-LAD TRANSESOPHAGEAL ECHOCARDIOGRAM (TEE) MAZE PROCEDURE  SURGEON:    Rexene Alberts, MD  ASSISTANTS:  John Giovanni, PA-C  ANESTHESIA:   Finis Bud, MD  CROSSCLAMP TIME:   127'  CARDIOPULMONARY BYPASS TIME: 166'  FINDINGS:  Normal LV systolic function  Mild mitral regurgitation  Moderate pulmonary hypertension  Maze Procedure  Surgical Approach: Median sternotomy  Cut-and-sew:  No.  Cryo: Yes  Cryo Lesions (select all that apply):       6  Mitral Valve Cryo Lesion,     10  Tricuspid Cryo Lesion, 16  Other - epicardial posterior AV groove and coronary sinus  Radiofrequency:  Yes.  Bipolar: Yes.  RF Lesions (select all that apply):      1   Pulmonary Vein Isolation,    2   Box Lesion,   3a  Inferior Pulmonary Vein Connecting Lesion,   3b  Superior Pulmonary Vein Connecting Lesion,     4  Posterior Mirtal Annular Line,    11  Intercaval Line,   15a  RAA Lateral Wall (Short) and   15b  RAA Lateral Wall to "T" Lesion    Left Atrial Appendage Treatment:    Yes -  epicardial clip    COMPLICATIONS: None  BASELINE WEIGHT: 132 kg  PATIENT DISPOSITION:   TO SICU IN STABLE CONDITION  Rexene Alberts, MD 06/30/2015 2:19 PM

## 2015-06-30 NOTE — Procedures (Signed)
Extubation Procedure Note  Patient Details:   Name: CHRISTI LEVENTRY DOB: Jun 12, 1961 MRN: [id number]   Airway Documentation:     Evaluation  O2 sats: stable throughout Complications: No apparent complications Patient did tolerate procedure well. Bilateral Breath Sounds: Clear, Diminished Suctioning: Oral, Airway Yes   Pt extubated to 3L Delta per Open Heart Rapid Wean protocol. Pt able to speak and cough appropriately post extubation with no complications. VC 0.800, NIF -30, ABG within normal limits. RT will continue to monitor.   Laymond Purser M 06/30/2015, 6:05 PM

## 2015-06-30 NOTE — OR Nursing (Signed)
SICU notified of 45 min call at 1340

## 2015-06-30 NOTE — Interval H&P Note (Signed)
History and Physical Interval Note:  06/30/2015 6:08 AM  Harold Scott  has presented today for surgery, with the diagnosis of CAD AFIB MR  The various methods of treatment have been discussed with the patient and family. After consideration of risks, benefits and other options for treatment, the patient has consented to  Procedure(s): CORONARY ARTERY BYPASS GRAFTING (CABG) (N/A) TRANSESOPHAGEAL ECHOCARDIOGRAM (TEE) (N/A) MAZE (N/A) POSSIBLE MITRAL VALVE REPAIR (MVR) (N/A) as a surgical intervention .  The patient's history has been reviewed, patient examined, no change in status, stable for surgery.  I have reviewed the patient's chart and labs.  Questions were answered to the patient's satisfaction.     Rexene Alberts

## 2015-06-30 NOTE — Progress Notes (Signed)
  Echocardiogram 2D Echocardiogram has been performed.  Donata Clay 06/30/2015, 9:19 AM

## 2015-06-30 NOTE — Progress Notes (Signed)
Placed patient on his home CPAP with 5l O2 bled in. Patient is resting comfortably and RT will continue to monitor.

## 2015-06-30 NOTE — OR Nursing (Signed)
SICU RN notified of 1 hr off pump call

## 2015-06-30 NOTE — Transfer of Care (Signed)
Immediate Anesthesia Transfer of Care Note  Patient: Harold Scott  Procedure(s) Performed: Procedure(s): CORONARY ARTERY BYPASS GRAFTING (CABG)X2 SEQ LIMA-DIAG-LAD (N/A) TRANSESOPHAGEAL ECHOCARDIOGRAM (TEE) (N/A) MAZE (N/A)  Patient Location: SICU  Anesthesia Type:General  Level of Consciousness: Patient remains intubated per anesthesia plan  Airway & Oxygen Therapy: Patient remains intubated per anesthesia plan and Patient placed on Ventilator (see vital sign flow sheet for setting)  Post-op Assessment: Report given to RN and Post -op Vital signs reviewed and stable  Post vital signs: Reviewed and stable  Last Vitals:  Filed Vitals:   06/30/15 1445  BP: 117/80  Pulse: 90  Temp:   Resp: 12    Complications: No apparent anesthesia complications

## 2015-06-30 NOTE — Progress Notes (Signed)
Initiated Open Heart Rapid Wean Protocol per policy. RT will continue to monitor.  

## 2015-07-01 ENCOUNTER — Encounter (HOSPITAL_COMMUNITY): Payer: Self-pay | Admitting: Thoracic Surgery (Cardiothoracic Vascular Surgery)

## 2015-07-01 ENCOUNTER — Inpatient Hospital Stay (HOSPITAL_COMMUNITY): Payer: 59

## 2015-07-01 LAB — BLOOD GAS, ARTERIAL
ACID-BASE DEFICIT: 0.9 mmol/L (ref 0.0–2.0)
BICARBONATE: 23.3 meq/L (ref 20.0–24.0)
Drawn by: 437071
O2 CONTENT: 5 L/min
O2 SAT: 95.1 %
PATIENT TEMPERATURE: 97.4
PCO2 ART: 37.7 mmHg (ref 35.0–45.0)
PO2 ART: 75.9 mmHg — AB (ref 80.0–100.0)
TCO2: 24.5 mmol/L (ref 0–100)
pH, Arterial: 7.404 (ref 7.350–7.450)

## 2015-07-01 LAB — GLUCOSE, CAPILLARY
GLUCOSE-CAPILLARY: 150 mg/dL — AB (ref 65–99)
GLUCOSE-CAPILLARY: 146 mg/dL — AB (ref 65–99)
GLUCOSE-CAPILLARY: 156 mg/dL — AB (ref 65–99)
GLUCOSE-CAPILLARY: 147 mg/dL — AB (ref 65–99)
GLUCOSE-CAPILLARY: 148 mg/dL — AB (ref 65–99)
GLUCOSE-CAPILLARY: 157 mg/dL — AB (ref 65–99)
GLUCOSE-CAPILLARY: 162 mg/dL — AB (ref 65–99)
GLUCOSE-CAPILLARY: 147 mg/dL — AB (ref 65–99)
GLUCOSE-CAPILLARY: 115 mg/dL — AB (ref 65–99)
GLUCOSE-CAPILLARY: 109 mg/dL — AB (ref 65–99)
Glucose-Capillary: 158 mg/dL — ABNORMAL HIGH (ref 65–99)
Glucose-Capillary: 158 mg/dL — ABNORMAL HIGH (ref 65–99)
Glucose-Capillary: 166 mg/dL — ABNORMAL HIGH (ref 65–99)

## 2015-07-01 LAB — CBC
HCT: 33 % — ABNORMAL LOW (ref 39.0–52.0)
HCT: 32.3 % — ABNORMAL LOW (ref 39.0–52.0)
HEMOGLOBIN: 10.8 g/dL — AB (ref 13.0–17.0)
Hemoglobin: 10.3 g/dL — ABNORMAL LOW (ref 13.0–17.0)
MCH: 31 pg (ref 26.0–34.0)
MCH: 30.2 pg (ref 26.0–34.0)
MCHC: 32.7 g/dL (ref 30.0–36.0)
MCHC: 31.9 g/dL (ref 30.0–36.0)
MCV: 94.8 fL (ref 78.0–100.0)
MCV: 94.7 fL (ref 78.0–100.0)
PLATELETS: 195 10*3/uL (ref 150–400)
PLATELETS: 187 10*3/uL (ref 150–400)
RBC: 3.48 MIL/uL — ABNORMAL LOW (ref 4.22–5.81)
RBC: 3.41 MIL/uL — ABNORMAL LOW (ref 4.22–5.81)
RDW: 14 % (ref 11.5–15.5)
RDW: 14.2 % (ref 11.5–15.5)
WBC: 15.1 10*3/uL — ABNORMAL HIGH (ref 4.0–10.5)
WBC: 16.3 10*3/uL — ABNORMAL HIGH (ref 4.0–10.5)

## 2015-07-01 LAB — HEMOGLOBIN AND HEMATOCRIT, BLOOD
HEMATOCRIT: 30.8 % — AB (ref 39.0–52.0)
Hemoglobin: 10.5 g/dL — ABNORMAL LOW (ref 13.0–17.0)

## 2015-07-01 LAB — POCT I-STAT, CHEM 8
BUN: 15 mg/dL (ref 6–20)
CALCIUM ION: 1.11 mmol/L — AB (ref 1.12–1.23)
CHLORIDE: 95 mmol/L — AB (ref 101–111)
Creatinine, Ser: 1 mg/dL (ref 0.61–1.24)
Glucose, Bld: 151 mg/dL — ABNORMAL HIGH (ref 65–99)
HCT: 34 % — ABNORMAL LOW (ref 39.0–52.0)
HEMOGLOBIN: 11.6 g/dL — AB (ref 13.0–17.0)
POTASSIUM: 4.3 mmol/L (ref 3.5–5.1)
SODIUM: 134 mmol/L — AB (ref 135–145)
TCO2: 26 mmol/L (ref 0–100)

## 2015-07-01 LAB — BASIC METABOLIC PANEL
Anion gap: 9 (ref 5–15)
BUN: 12 mg/dL (ref 6–20)
CALCIUM: 8.1 mg/dL — AB (ref 8.9–10.3)
CO2: 25 mmol/L (ref 22–32)
CREATININE: 0.97 mg/dL (ref 0.61–1.24)
Chloride: 102 mmol/L (ref 101–111)
GFR calc Af Amer: >60 mL/min (ref >60)
Glucose, Bld: 162 mg/dL — ABNORMAL HIGH (ref 65–99)
Potassium: 5.1 mmol/L (ref 3.5–5.1)
SODIUM: 136 mmol/L (ref 135–145)

## 2015-07-01 LAB — CREATININE, SERUM
CREATININE: 1.03 mg/dL (ref 0.61–1.24)
GFR calc Af Amer: >60 mL/min (ref >60)
GFR calc non Af Amer: >60 mL/min (ref >60)

## 2015-07-01 LAB — MAGNESIUM
MAGNESIUM: 2.4 mg/dL (ref 1.7–2.4)
Magnesium: 2.2 mg/dL (ref 1.7–2.4)

## 2015-07-01 MED ORDER — INSULIN ASPART 100 UNIT/ML ~~LOC~~ SOLN
0.0000 [IU] | SUBCUTANEOUS | Status: DC
  Administered 2015-07-01 – 2015-07-02 (×3): 2 [IU] via SUBCUTANEOUS

## 2015-07-01 MED ORDER — MORPHINE SULFATE (PF) 2 MG/ML IV SOLN
2.0000 mg | INTRAVENOUS | Status: DC | PRN
  Administered 2015-07-01 – 2015-07-03 (×11): 2 mg via INTRAVENOUS
  Filled 2015-07-01 (×11): qty 1

## 2015-07-01 MED ORDER — ENOXAPARIN SODIUM 30 MG/0.3ML ~~LOC~~ SOLN
30.0000 mg | SUBCUTANEOUS | Status: DC
  Administered 2015-07-02 – 2015-07-04 (×3): 30 mg via SUBCUTANEOUS
  Filled 2015-07-01 (×4): qty 0.3

## 2015-07-01 MED ORDER — SIMVASTATIN 20 MG PO TABS
20.0000 mg | ORAL_TABLET | Freq: Every day | ORAL | Status: DC
  Administered 2015-07-02 – 2015-07-06 (×5): 20 mg via ORAL
  Filled 2015-07-01 (×6): qty 1

## 2015-07-01 MED ORDER — INSULIN ASPART 100 UNIT/ML ~~LOC~~ SOLN: 0.0000 [IU] | SUBCUTANEOUS | Status: DC

## 2015-07-01 MED ORDER — KETOROLAC TROMETHAMINE 15 MG/ML IJ SOLN
15.0000 mg | Freq: Four times a day (QID) | INTRAMUSCULAR | Status: AC
  Administered 2015-07-01 – 2015-07-02 (×5): 15 mg via INTRAVENOUS
  Filled 2015-07-01 (×5): qty 1

## 2015-07-01 MED ORDER — CLORAZEPATE DIPOTASSIUM 3.75 MG PO TABS
7.5000 mg | ORAL_TABLET | Freq: Every day | ORAL | Status: DC
  Administered 2015-07-02 – 2015-07-06 (×5): 7.5 mg via ORAL
  Filled 2015-07-01 (×7): qty 2

## 2015-07-01 MED ORDER — INSULIN DETEMIR 100 UNIT/ML ~~LOC~~ SOLN
20.0000 [IU] | Freq: Two times a day (BID) | SUBCUTANEOUS | Status: DC
  Administered 2015-07-01 – 2015-07-04 (×8): 20 [IU] via SUBCUTANEOUS
  Filled 2015-07-01 (×11): qty 0.2

## 2015-07-01 MED ORDER — SODIUM CHLORIDE 0.9 % IV SOLN: 250.0000 mL | INTRAVENOUS | Status: DC

## 2015-07-01 MED ORDER — FUROSEMIDE 10 MG/ML IJ SOLN
20.0000 mg | Freq: Four times a day (QID) | INTRAMUSCULAR | Status: AC
  Administered 2015-07-01 (×3): 20 mg via INTRAVENOUS
  Filled 2015-07-01 (×3): qty 2

## 2015-07-01 MED ORDER — CITALOPRAM HYDROBROMIDE 20 MG PO TABS
10.0000 mg | ORAL_TABLET | Freq: Every day | ORAL | Status: DC
  Administered 2015-07-01 – 2015-07-07 (×7): 10 mg via ORAL
  Filled 2015-07-01 (×7): qty 1

## 2015-07-01 MED ORDER — AMIODARONE HCL 200 MG PO TABS
200.0000 mg | ORAL_TABLET | Freq: Every day | ORAL | Status: DC
  Administered 2015-07-01 – 2015-07-03 (×3): 200 mg via ORAL
  Filled 2015-07-01 (×3): qty 1

## 2015-07-01 MED FILL — Calcium Chloride Inj 10%: INTRAVENOUS | Qty: 10 | Status: AC

## 2015-07-01 MED FILL — Electrolyte-R (PH 7.4) Solution: INTRAVENOUS | Qty: 4000 | Status: AC

## 2015-07-01 MED FILL — Heparin Sodium (Porcine) Inj 1000 Unit/ML: INTRAMUSCULAR | Qty: 20 | Status: AC

## 2015-07-01 MED FILL — Mannitol IV Soln 20%: INTRAVENOUS | Qty: 500 | Status: AC

## 2015-07-01 MED FILL — Sodium Bicarbonate IV Soln 8.4%: INTRAVENOUS | Qty: 50 | Status: AC

## 2015-07-01 MED FILL — Sodium Chloride IV Soln 0.9%: INTRAVENOUS | Qty: 2000 | Status: AC

## 2015-07-01 MED FILL — Lidocaine HCl IV Inj 20 MG/ML: INTRAVENOUS | Qty: 5 | Status: AC

## 2015-07-01 NOTE — Progress Notes (Signed)
Utilization review completed. Coran Dipaola, RN, BSN. 

## 2015-07-01 NOTE — Progress Notes (Signed)
[address]       [place] [address]             [phone]        CARDIOTHORACIC SURGERY PROGRESS NOTE   R1 Day Post-Op Procedure(s) (LRB): CORONARY ARTERY BYPASS GRAFTING (CABG)X2 SEQ LIMA-DIAG-LAD (N/A) TRANSESOPHAGEAL ECHOCARDIOGRAM (TEE) (N/A) MAZE (N/A)  Subjective: Looks good.  Feels sore in chest and shoulders.  Some discomfort right thumb and index finger associated with A-line.  No nausea.  Objective: Vital signs: BP Readings from Last 1 Encounters:  07/01/15 117/74   Pulse Readings from Last 1 Encounters:  07/01/15 67   Resp Readings from Last 1 Encounters:  07/01/15 17   Temp Readings from Last 1 Encounters:  07/01/15 98.2 F (36.8 C)     Hemodynamics: PAP: (34-64)/(11-31) 43/22 mmHg CO:  [4.3 L/min-7.5 L/min] 4.4 L/min CI:  [1.8 L/min/m2-3.1 L/min/m2] 1.8 L/min/m2  Physical Exam:  Rhythm:   sinus  Breath sounds: clear  Heart sounds:  RRR  Incisions:  Dressing dry, intact  Abdomen:  Soft, non-distended, non-tender  Extremities:  Warm, well-perfused  Chest tubes:  Low volume thin serosanguinous output but still draining some, no air leak    Intake/Output from previous day: 11/10 0701 - 11/11 0700 In: 5313.3 [I.V.:3278.3; Blood:460; IV Piggyback:1500] Out: 3065 [Urine:2415; Chest Tube:650] Intake/Output this shift:    Lab Results:  CBC: Recent Labs  06/30/15 2045 06/30/15 2047 07/01/15 0435  WBC 15.7*  --  15.1*  HGB 11.0* 11.6* 10.8*  HCT 34.2* 34.0* 33.0*  PLT 187  --  195    BMET:  Recent Labs  06/28/15 1118  06/30/15 2047 07/01/15 0545  NA 137  < > 137 136  K 4.8  < > 4.9 5.1  CL 100*  < > 101 102  CO2 26  --   --  25  GLUCOSE 113*  < > 162* 162*  BUN 11  < > 11 12  CREATININE 0.94  < > 0.80 0.97  CALCIUM 9.4  --   --  8.1*  < > = values in this interval not displayed.   PT/INR:   Recent Labs  06/30/15 1455  LABPROT 17.0*  INR 1.37    CBG (last 3)   Recent Labs  07/01/15 0216  07/01/15 0312 07/01/15 0402  GLUCAP 162* 166* 147*    ABG    Component Value Date/Time   PHART 7.404 07/01/2015 0455   PCO2ART 37.7 07/01/2015 0455   PO2ART 75.9* 07/01/2015 0455   HCO3 23.3 07/01/2015 0455   TCO2 24.5 07/01/2015 0455   ACIDBASEDEF 0.9 07/01/2015 0455   O2SAT 95.1 07/01/2015 0455    CXR: PORTABLE CHEST 1 VIEW  COMPARISON: Portable chest x-ray of June 30, 2015.  FINDINGS: The lungs are better inflated today. The interstitial markings are less conspicuous. There is persistent subsegmental atelectasis on the left. There is no pneumothorax or pleural effusion. The cardiac silhouette remains enlarged but its margins are more distinct. The pulmonary vascularity is less engorged.  There has been interval extubation of the trachea and esophagus. The Swan-Ganz catheter tip projects over the proximal right main pulmonary artery. The left-sided chest tube and the right-sided mediastinal drain are unchanged in position. A left atrial appendage clip is demonstrated. There are 7 intact sternal wires.  IMPRESSION: Improving pulmonary interstitial edema and decreasing left basilar atelectasis. Decreased pulmonary vascular congestion. Stable mild cardiomegaly. There is no pleural effusion or pneumothorax. The remaining support tubes  are in reasonable position.   Electronically Signed  By: David Martinique M.D.  On: 07/01/2015 07:14   Assessment/Plan: S/P Procedure(s) (LRB): CORONARY ARTERY BYPASS GRAFTING (CABG)X2 SEQ LIMA-DIAG-LAD (N/A) TRANSESOPHAGEAL ECHOCARDIOGRAM (TEE) (N/A) MAZE (N/A)  Doing well POD1 Maintaining NSR w/ stable hemodynamics, no drips O2 sats 95-96% on 5 L/min via [place] Expected post op acute blood loss anemia, mild Expected post op atelectasis, mild Chronic diastolic CHF with expected post-op volume excess, mild Pulmonary hypertension, moderate Type II diabetes mellitus, excellent glycemic control on insulin drip Morbid  obesity OSA on CPAP at home Hyperlipidemia Depression Anxiety   Mobilize  Diuresis  Restart amiodarone and low dose beta blocker  Restart Cozaar in 2-3 days if BP will allow  Restart Celexa and Tranxene  Leave chest tubes in for now - possibly d/c later today or tomorrow depending on drainage volume  Add levemir insulin and wean drip  Lovenox for DVT prophylaxis to begin tomorrow  Restart Eliquis at hospital discharge  Rexene Alberts, MD 07/01/2015 7:45 AM

## 2015-07-01 NOTE — Progress Notes (Signed)
Placed patient on his home CPAP unit with 4L O2 bled in. Patient is tolerating it well and RT will continue to monitor.

## 2015-07-01 NOTE — Plan of Care (Signed)
Problem: Bowel/Gastric: Goal: Gastrointestinal status for postoperative course will improve Outcome: Progressing Pt reports passing gas  Problem: Cardiac: Goal: Hemodynamic stability will improve Outcome: Progressing Pt not requiring pressor support at this time. Still atrial pacing. Goal: Ability to maintain an adequate cardiac output will improve Outcome: Progressing CI<1.8  Problem: Respiratory: Goal: Ability to tolerate decreased levels of ventilator support will improve Outcome: Completed/Met Date Met:  07/01/15 Pt extubated  Problem: Pain Management: Goal: Pain level will decrease Outcome: Progressing Pt reporting a decrease in pain.

## 2015-07-01 NOTE — Progress Notes (Signed)
      [address]       ,[place] [address]             [phone]      Up in chair  BP 146/91 mmHg  Pulse 65  Temp(Src) 98.4 F (36.9 C) (Oral)  Resp 21  Ht 5\' 8"  (1.727 m)  Wt 296 lb 1.2 oz (134.3 kg)  BMI 45.03 kg/m2  SpO2 97%   Intake/Output Summary (Last 24 hours) at 07/01/15 1756 Last data filed at 07/01/15 1700  Gross per 24 hour  Intake 2185.59 ml  Output   2010 ml  Net 175.59 ml    Doing well POD # 1  Tushar Enns C. Roxan Hockey, MD [facility] [phone]

## 2015-07-02 ENCOUNTER — Inpatient Hospital Stay (HOSPITAL_COMMUNITY): Payer: 59

## 2015-07-02 LAB — CBC
HCT: 31.2 % — ABNORMAL LOW (ref 39.0–52.0)
HEMOGLOBIN: 10 g/dL — AB (ref 13.0–17.0)
MCH: 30.8 pg (ref 26.0–34.0)
MCHC: 32.1 g/dL (ref 30.0–36.0)
MCV: 96 fL (ref 78.0–100.0)
PLATELETS: 194 10*3/uL (ref 150–400)
RBC: 3.25 MIL/uL — ABNORMAL LOW (ref 4.22–5.81)
RDW: 14.3 % (ref 11.5–15.5)
WBC: 17.6 10*3/uL — ABNORMAL HIGH (ref 4.0–10.5)

## 2015-07-02 LAB — BASIC METABOLIC PANEL
ANION GAP: 7 (ref 5–15)
BUN: 16 mg/dL (ref 6–20)
CALCIUM: 8.4 mg/dL — AB (ref 8.9–10.3)
CO2: 30 mmol/L (ref 22–32)
CREATININE: 1.22 mg/dL (ref 0.61–1.24)
Chloride: 98 mmol/L — ABNORMAL LOW (ref 101–111)
GFR calc Af Amer: >60 mL/min (ref >60)
GLUCOSE: 129 mg/dL — AB (ref 65–99)
Potassium: 4.3 mmol/L (ref 3.5–5.1)
Sodium: 135 mmol/L (ref 135–145)

## 2015-07-02 LAB — GLUCOSE, CAPILLARY
GLUCOSE-CAPILLARY: 119 mg/dL — AB (ref 65–99)
GLUCOSE-CAPILLARY: 122 mg/dL — AB (ref 65–99)
GLUCOSE-CAPILLARY: 131 mg/dL — AB (ref 65–99)
GLUCOSE-CAPILLARY: 135 mg/dL — AB (ref 65–99)
GLUCOSE-CAPILLARY: 141 mg/dL — AB (ref 65–99)
GLUCOSE-CAPILLARY: 142 mg/dL — AB (ref 65–99)
GLUCOSE-CAPILLARY: 159 mg/dL — AB (ref 65–99)
GLUCOSE-CAPILLARY: 163 mg/dL — AB (ref 65–99)
Glucose-Capillary: 149 mg/dL — ABNORMAL HIGH (ref 65–99)
Glucose-Capillary: 148 mg/dL — ABNORMAL HIGH (ref 65–99)
Glucose-Capillary: 153 mg/dL — ABNORMAL HIGH (ref 65–99)
Glucose-Capillary: 145 mg/dL — ABNORMAL HIGH (ref 65–99)
Glucose-Capillary: 127 mg/dL — ABNORMAL HIGH (ref 65–99)
Glucose-Capillary: 120 mg/dL — ABNORMAL HIGH (ref 65–99)

## 2015-07-02 MED ORDER — INSULIN ASPART 100 UNIT/ML ~~LOC~~ SOLN
0.0000 [IU] | Freq: Three times a day (TID) | SUBCUTANEOUS | Status: DC
Start: 2015-07-02 — End: 2015-07-05
  Administered 2015-07-04: 3 [IU] via SUBCUTANEOUS

## 2015-07-02 MED ORDER — DILTIAZEM HCL 100 MG IV SOLR
10.0000 mg/h | INTRAVENOUS | Status: DC
  Administered 2015-07-02 – 2015-07-04 (×6): 10 mg/h via INTRAVENOUS
  Filled 2015-07-02 (×5): qty 100

## 2015-07-02 MED ORDER — MAGNESIUM HYDROXIDE 400 MG/5ML PO SUSP
30.0000 mL | Freq: Every day | ORAL | Status: DC | PRN
  Administered 2015-07-05: 30 mL via ORAL
  Filled 2015-07-02: qty 30

## 2015-07-02 MED ORDER — SODIUM CHLORIDE 0.9 % IV SOLN: 250.0000 mL | INTRAVENOUS | Status: DC | PRN

## 2015-07-02 MED ORDER — POTASSIUM CHLORIDE CRYS ER 20 MEQ PO TBCR
20.0000 meq | EXTENDED_RELEASE_TABLET | Freq: Every day | ORAL | Status: DC
  Administered 2015-07-02: 20 meq via ORAL
  Filled 2015-07-02: qty 1

## 2015-07-02 MED ORDER — ALUM & MAG HYDROXIDE-SIMETH 200-200-20 MG/5ML PO SUSP: 15.0000 mL | ORAL | Status: DC | PRN

## 2015-07-02 MED ORDER — GUAIFENESIN-DM 100-10 MG/5ML PO SYRP: 15.0000 mL | ORAL_SOLUTION | ORAL | Status: DC | PRN

## 2015-07-02 MED ORDER — LEVALBUTEROL HCL 0.63 MG/3ML IN NEBU
0.6300 mg | INHALATION_SOLUTION | Freq: Four times a day (QID) | RESPIRATORY_TRACT | Status: DC | PRN
  Administered 2015-07-02 – 2015-07-03 (×3): 0.63 mg via RESPIRATORY_TRACT
  Filled 2015-07-02 (×3): qty 3

## 2015-07-02 MED ORDER — MOVING RIGHT ALONG BOOK
Freq: Once | Status: DC
  Filled 2015-07-02: qty 1

## 2015-07-02 MED ORDER — CHOLESTYRAMINE 4 G PO PACK
4.0000 g | PACK | Freq: Two times a day (BID) | ORAL | Status: DC
  Administered 2015-07-02 – 2015-07-05 (×2): 4 g via ORAL
  Filled 2015-07-02 (×12): qty 1

## 2015-07-02 MED ORDER — CETYLPYRIDINIUM CHLORIDE 0.05 % MT LIQD: 7.0000 mL | Freq: Two times a day (BID) | OROMUCOSAL | Status: DC

## 2015-07-02 MED ORDER — METOPROLOL SUCCINATE ER 25 MG PO TB24
25.0000 mg | ORAL_TABLET | Freq: Two times a day (BID) | ORAL | Status: DC
  Administered 2015-07-02 – 2015-07-07 (×10): 25 mg via ORAL
  Filled 2015-07-02 (×10): qty 1

## 2015-07-02 MED ORDER — SODIUM CHLORIDE 0.9 % IJ SOLN
3.0000 mL | Freq: Two times a day (BID) | INTRAMUSCULAR | Status: DC
  Administered 2015-07-03 – 2015-07-06 (×6): 3 mL via INTRAVENOUS

## 2015-07-02 MED ORDER — SODIUM CHLORIDE 0.9 % IJ SOLN: 3.0000 mL | INTRAMUSCULAR | Status: DC | PRN

## 2015-07-02 MED ORDER — FUROSEMIDE 40 MG PO TABS
40.0000 mg | ORAL_TABLET | Freq: Every day | ORAL | Status: DC
  Administered 2015-07-02 – 2015-07-05 (×4): 40 mg via ORAL
  Filled 2015-07-02 (×4): qty 1

## 2015-07-02 NOTE — Progress Notes (Signed)
2 Days Post-Op Procedure(s) (LRB): CORONARY ARTERY BYPASS GRAFTING (CABG)X2 SEQ LIMA-DIAG-LAD (N/A) TRANSESOPHAGEAL ECHOCARDIOGRAM (TEE) (N/A) MAZE (N/A) Subjective: C/o shoulder pain  Objective: Vital signs in last 24 hours: Temp:  [97.4 F (36.3 C)-98.4 F (36.9 C)] 97.6 F (36.4 C) (11/12 0700) Pulse Rate:  [56-83] 63 (11/12 0911) Cardiac Rhythm:  [-] Normal sinus rhythm;Sinus bradycardia (11/12 0800) Resp:  [11-36] 20 (11/12 0900) BP: (104-152)/(46-97) 125/75 mmHg (11/12 0911) SpO2:  [93 %-97 %] 96 % (11/12 0900) Weight:  [293 lb 3.4 oz (133 kg)] 293 lb 3.4 oz (133 kg) (11/12 0600)  Hemodynamic parameters for last 24 hours:    Intake/Output from previous day: 11/11 0701 - 11/12 0700 In: 1435.8 [P.O.:1080; I.V.:255.8; IV Piggyback:100] Out: 1685 [Urine:1225; Chest Tube:460] Intake/Output this shift: Total I/O In: 20 [I.V.:20] Out: 60 [Urine:30; Chest Tube:30]  General appearance: alert, cooperative and no distress Neurologic: intact Heart: regular rate and rhythm Lungs: diminished breath sounds bibasilar Abdomen: obese, soft , nontender  Lab Results:  Recent Labs  07/01/15 1725 07/02/15 0345  WBC 16.3* 17.6*  HGB 10.3* 10.0*  HCT 32.3* 31.2*  PLT 187 194   BMET:  Recent Labs  07/01/15 0545 07/01/15 1652 07/01/15 1725 07/02/15 0345  NA 136 134*  --  135  K 5.1 4.3  --  4.3  CL 102 95*  --  98*  CO2 25  --   --  30  GLUCOSE 162* 151*  --  129*  BUN 12 15  --  16  CREATININE 0.97 1.00 1.03 1.22  CALCIUM 8.1*  --   --  8.4*    PT/INR:  Recent Labs  06/30/15 1455  LABPROT 17.0*  INR 1.37   ABG    Component Value Date/Time   PHART 7.404 07/01/2015 0455   HCO3 23.3 07/01/2015 0455   TCO2 26 07/01/2015 1652   ACIDBASEDEF 0.9 07/01/2015 0455   O2SAT 95.1 07/01/2015 0455   CBG (last 3)   Recent Labs  07/01/15 2015 07/02/15 0007 07/02/15 0405  GLUCAP 109* 141* 119*    Assessment/Plan: S/P Procedure(s) (LRB): CORONARY ARTERY BYPASS  GRAFTING (CABG)X2 SEQ LIMA-DIAG-LAD (N/A) TRANSESOPHAGEAL ECHOCARDIOGRAM (TEE) (N/A) MAZE (N/A) Plan for transfer to step-down: see transfer orders   POD # 2 CABG, Maze  CV- stable, in SR on amiodarone  RESP- IS for bibasilar atelectasis  CPAP QHS  RENAL- creatinine OK, resume PO lasix  ENDO- CBG well controlled- change to AC/HS  Anemia secondary to ABL- mild, follow  Cardiac rehab    LOS: 2 days    Melrose Nakayama 07/02/2015

## 2015-07-02 NOTE — Progress Notes (Signed)
Cardiac Rehab Phase I  Pt had just walked with RN for 400 ft and was in bed.  Reviewed diet guidelines with pt as he had been concerned about his lunch.  He also mentioned that he felt "wiped out" after his second walk.  Discussed the importance of getting his walks in and making sure he is allowing for adequate rest in between as well as taking in enough food to maintain his energy.  He is eager to make changes in his lifestyle.  Encouraged pt to walk with staff over weekend three times a day.  We will f/u on Monday. Alberteen Sam, [place], ACSM RCEP [phone]

## 2015-07-02 NOTE — Progress Notes (Addendum)
Report called to Jeneen Rinks, [place] Harold Scott 10:43 AM   Pt transferred to 2West via ambulation, vss when ambulating, at rest in new room noticed to be in Afib, receiving RN at bedside and aware, settled in bed. 11:24 AM

## 2015-07-03 ENCOUNTER — Inpatient Hospital Stay (HOSPITAL_COMMUNITY): Payer: 59

## 2015-07-03 LAB — BASIC METABOLIC PANEL
ANION GAP: 12 (ref 5–15)
BUN: 17 mg/dL (ref 6–20)
CALCIUM: 8.2 mg/dL — AB (ref 8.9–10.3)
CO2: 22 mmol/L (ref 22–32)
CREATININE: 0.92 mg/dL (ref 0.61–1.24)
Chloride: 96 mmol/L — ABNORMAL LOW (ref 101–111)
Glucose, Bld: 125 mg/dL — ABNORMAL HIGH (ref 65–99)
Potassium: 5.4 mmol/L — ABNORMAL HIGH (ref 3.5–5.1)
SODIUM: 130 mmol/L — AB (ref 135–145)

## 2015-07-03 LAB — CBC
HEMATOCRIT: 30.3 % — AB (ref 39.0–52.0)
Hemoglobin: 9.9 g/dL — ABNORMAL LOW (ref 13.0–17.0)
MCH: 31 pg (ref 26.0–34.0)
MCHC: 32.7 g/dL (ref 30.0–36.0)
MCV: 95 fL (ref 78.0–100.0)
PLATELETS: ADEQUATE 10*3/uL (ref 150–400)
RBC: 3.19 MIL/uL — ABNORMAL LOW (ref 4.22–5.81)
RDW: 14.5 % (ref 11.5–15.5)
WBC: 16.5 10*3/uL — AB (ref 4.0–10.5)

## 2015-07-03 LAB — GLUCOSE, CAPILLARY
GLUCOSE-CAPILLARY: 124 mg/dL — AB (ref 65–99)
GLUCOSE-CAPILLARY: 134 mg/dL — AB (ref 65–99)
GLUCOSE-CAPILLARY: 121 mg/dL — AB (ref 65–99)
GLUCOSE-CAPILLARY: 111 mg/dL — AB (ref 65–99)

## 2015-07-03 MED ORDER — ZOLPIDEM TARTRATE 5 MG PO TABS: 10.0000 mg | ORAL_TABLET | Freq: Every evening | ORAL | Status: DC | PRN

## 2015-07-03 MED ORDER — GUAIFENESIN ER 600 MG PO TB12
600.0000 mg | ORAL_TABLET | Freq: Two times a day (BID) | ORAL | Status: DC
  Administered 2015-07-03 – 2015-07-07 (×9): 600 mg via ORAL
  Filled 2015-07-03 (×9): qty 1

## 2015-07-03 MED ORDER — KETOROLAC TROMETHAMINE 30 MG/ML IJ SOLN
30.0000 mg | Freq: Four times a day (QID) | INTRAMUSCULAR | Status: AC
  Administered 2015-07-03 – 2015-07-04 (×5): 30 mg via INTRAVENOUS
  Filled 2015-07-03 (×5): qty 1

## 2015-07-03 NOTE — Progress Notes (Signed)
PT demonstrates verbal and hands on understanding of Flutter- RN aware.

## 2015-07-03 NOTE — Progress Notes (Addendum)
[address]       [place] [address]             [phone]          3 Days Post-Op Procedure(s) (LRB): CORONARY ARTERY BYPASS GRAFTING (CABG)X2 SEQ LIMA-DIAG-LAD (N/A) TRANSESOPHAGEAL ECHOCARDIOGRAM (TEE) (N/A) MAZE (N/A)  Subjective: "I feel like something the cat puked up."  Having a lot of shoulder and back discomfort. Weak when getting up and around. Back in AF overnight and started on IV Cardizem.  Presently rate controlled. No appetite. Bowels working.   Objective: Vital signs in last 24 hours: Patient Vitals for the past 24 hrs:  BP Temp Temp src Pulse Resp SpO2 Weight  07/03/15 0602 113/62 mmHg 99.4 F (37.4 C) Oral (!) 101 (!) 21 92 % -  07/03/15 0500 - - - - - - (!) 301 lb 9.6 oz (136.805 kg)  07/03/15 0036 - - - - - 94 % -  07/03/15 0018 - 97.5 F (36.4 C) Oral - - - -  07/02/15 2043 110/72 mmHg 99.1 F (37.3 C) Oral 97 20 97 % -  07/02/15 2035 - 99.1 F (37.3 C) - - - - -  07/02/15 1956 - 99.8 F (37.7 C) - - - - -  07/02/15 1800 - (!) 102.4 F (39.1 C) - - - - -  07/02/15 1415 139/65 mmHg 99.8 F (37.7 C) Oral 95 - 96 % -  07/02/15 1116 (!) 157/96 mmHg 97.9 F (36.6 C) Oral 92 - 96 % -  07/02/15 1000 116/70 mmHg - - 63 (!) 21 96 % -  07/02/15 0911 125/75 mmHg - - 63 - - -  07/02/15 0900 125/75 mmHg - - 83 20 96 % -   Current Weight  07/03/15 301 lb 9.6 oz (136.805 kg)  BASELINE WEIGHT:132 kg   Intake/Output from previous day: 11/12 0701 - 11/13 0700 In: 426.2 [P.O.:360; I.V.:66.2] Out: 650 [Urine:570; Chest Tube:80]  CBGs 122-125-124   PHYSICAL EXAM:  Heart: Irr irr, rates 80s Lungs: Diminished BS in bases Wound: Clean and dry Extremities: +LE edema    Lab Results: CBC: Recent Labs  07/02/15 0345 07/03/15 0158  WBC 17.6* 16.5*  HGB 10.0* 9.9*  HCT 31.2* 30.3*  PLT 194 PLATELET CLUMPS NOTED ON SMEAR, COUNT APPEARS ADEQUATE   BMET:  Recent Labs  07/02/15 0345 07/03/15 0158  NA 135 130*  K 4.3  5.4*  CL 98* 96*  CO2 30 22  GLUCOSE 129* 125*  BUN 16 17  CREATININE 1.22 0.92  CALCIUM 8.4* 8.2*    PT/INR:  Recent Labs  06/30/15 1455  LABPROT 17.0*  INR 1.37   CXR: FINDINGS: Cardiomegaly, median sternotomy and left atrial appendage clip noted.  Subsegmental mid and lower lung atelectasis bilaterally again noted.  There is no evidence of pneumothorax.  There may be trace pleural effusions present.  A right IJ central venous catheter sheath has been removed.  IMPRESSION: Right IJ central venous catheter sheath removal without other significant change. Continued subsegmental and bibasilar atelectasis.   Assessment/Plan: S/P Procedure(s) (LRB): CORONARY ARTERY BYPASS GRAFTING (CABG)X2 SEQ LIMA-DIAG-LAD (N/A) TRANSESOPHAGEAL ECHOCARDIOGRAM (TEE) (N/A) MAZE (N/A)  CV- Acute on chronic AF, S/P Maze, presently rate controlled on IV Cardizem.  Continue po Toprol, Amio, hopefully can d/c Cardizem soon. He states he has been intolerant to higher doses of Amio in the past. Was to resume Eliquis at d/c, may need to go ahead and restart.  Pt has been followed in the past by [name] and is s/p multiple ablations.  BPs generally stable.  Pain control- Continue po meds prn, will add a few doses of Toradol.  Vol overload- Continue diuresis.  Leukocytosis- Tm last evening 102.4, afebrile since then.  Likely from atelectasis since he is not moving around much or taking deep breaths.  Encouraged pulm toilet, will add flutter valve and Mucinex. Follow labs in am.  Endocrine- CBGs fairly stable on Levemir, SSI.  A1C= 6.4, not previously on meds.  Hyperkalemia- will hold K today and watch.  Mobilize, work on [employer].   LOS: 3 days    COLLINS,GINA H 07/03/2015  Patient seen and examined agree with above He had a rough night Encouraged IS use hourly  Kari Kerth C. Roxan Hockey, MD [facility] [phone]

## 2015-07-03 NOTE — Progress Notes (Signed)
Pt refuses to wear CPAP tonight. Pilar Plate, RT checked with patient.

## 2015-07-03 NOTE — Progress Notes (Signed)
MD notified about pt's sodium level of 130 and potasium level of 5.4. No new orders received . Will continue to monitor. Oren Beckmann, RN.

## 2015-07-03 NOTE — Progress Notes (Signed)
Placed patient on his home CPAP unit with 4L O2 bled in. Patient is tolerating it well and RT will continue to monitor.

## 2015-07-04 ENCOUNTER — Encounter (HOSPITAL_COMMUNITY): Payer: Self-pay | Admitting: Thoracic Surgery (Cardiothoracic Vascular Surgery)

## 2015-07-04 LAB — GLUCOSE, CAPILLARY
GLUCOSE-CAPILLARY: 103 mg/dL — AB (ref 65–99)
Glucose-Capillary: 121 mg/dL — ABNORMAL HIGH (ref 65–99)
Glucose-Capillary: 106 mg/dL — ABNORMAL HIGH (ref 65–99)
Glucose-Capillary: 98 mg/dL (ref 65–99)

## 2015-07-04 LAB — URINE CULTURE: CULTURE: NO GROWTH

## 2015-07-04 LAB — CBC
HEMATOCRIT: 27.3 % — AB (ref 39.0–52.0)
HEMOGLOBIN: 8.9 g/dL — AB (ref 13.0–17.0)
MCH: 31.6 pg (ref 26.0–34.0)
MCHC: 32.6 g/dL (ref 30.0–36.0)
MCV: 96.8 fL (ref 78.0–100.0)
Platelets: 227 10*3/uL (ref 150–400)
RBC: 2.82 MIL/uL — ABNORMAL LOW (ref 4.22–5.81)
RDW: 14.4 % (ref 11.5–15.5)
WBC: 12.9 10*3/uL — AB (ref 4.0–10.5)

## 2015-07-04 LAB — BASIC METABOLIC PANEL
ANION GAP: 7 (ref 5–15)
BUN: 23 mg/dL — ABNORMAL HIGH (ref 6–20)
CHLORIDE: 94 mmol/L — AB (ref 101–111)
CO2: 30 mmol/L (ref 22–32)
Calcium: 8.3 mg/dL — ABNORMAL LOW (ref 8.9–10.3)
Creatinine, Ser: 0.97 mg/dL (ref 0.61–1.24)
GFR calc Af Amer: >60 mL/min (ref >60)
Glucose, Bld: 129 mg/dL — ABNORMAL HIGH (ref 65–99)
POTASSIUM: 4.1 mmol/L (ref 3.5–5.1)
SODIUM: 131 mmol/L — AB (ref 135–145)

## 2015-07-04 MED ORDER — AMIODARONE HCL 200 MG PO TABS
200.0000 mg | ORAL_TABLET | Freq: Two times a day (BID) | ORAL | Status: DC
  Administered 2015-07-04 – 2015-07-07 (×7): 200 mg via ORAL
  Filled 2015-07-04 (×7): qty 1

## 2015-07-04 MED ORDER — ENOXAPARIN SODIUM 40 MG/0.4ML ~~LOC~~ SOLN
40.0000 mg | SUBCUTANEOUS | Status: DC
  Administered 2015-07-05: 40 mg via SUBCUTANEOUS
  Filled 2015-07-04: qty 0.4

## 2015-07-04 MED FILL — Dexmedetomidine HCl in NaCl 0.9% IV Soln 400 MCG/100ML: INTRAVENOUS | Qty: 100 | Status: AC

## 2015-07-04 NOTE — Progress Notes (Signed)
Per report pt was not tolerating cardizem gtt well and felt "wiped out". Day shift RN d/c'd drip per orders received. Pt HR in the 80s in afib. Pt laying in the bed resting. No complaints at this time.   Will continue to monitor.   Harold Scott

## 2015-07-04 NOTE — Progress Notes (Signed)
CARDIAC REHAB PHASE I   PRE:  Rate/Rhythm: 65 afib  BP:  Supine:   Sitting: 109/70  Standing:    SaO2: 98% 4L  MODE:  Ambulation: 300 ft   POST:  Rate/Rhythm: 97 afib  BP:  Supine:   Sitting: 118/67  Standing:    SaO2: 99% 4L [id number] Pt very tired and does not feel well. Feels SOB so did not try to wean off oxygen. Walked pt 300 ft on 4L with rolling walker and asst x 1. Encouraged frequent rest stops and pursed lip breathing as pt very dyspneic and is discouraged as he thought he would be feeling better by now. To recliner after walk. Appreciative to walk.   Graylon Good, RN BSN  07/04/2015 9:28 AM

## 2015-07-04 NOTE — Progress Notes (Signed)
4 Days Post-Op Procedure(s) (LRB): CORONARY ARTERY BYPASS GRAFTING (CABG)X2 SEQ LIMA-DIAG-LAD (N/A) TRANSESOPHAGEAL ECHOCARDIOGRAM (TEE) (N/A) MAZE (N/A) Subjective: Starting to walk in the hallway now but slow progress after combined CABG and Maze procedure Atrial fibrillation heart rate 90 on IV Cardizem and oral metoprolol Chest x-ray clear but with low lung volumes Plan is to go home on Eliquis but that  cannot be started until EPW's are removed --continue Lovenox and aspirin for now.  Objective: Vital signs in last 24 hours: Temp:  [97.6 F (36.4 C)-98.6 F (37 C)] 97.6 F (36.4 C) (11/14 0812) Pulse Rate:  [65-103] 68 (11/14 0812) Cardiac Rhythm:  [-] Atrial fibrillation (11/14 0812) Resp:  [18-20] 20 (11/14 0812) BP: (96-122)/(52-74) 116/74 mmHg (11/14 0812) SpO2:  [93 %-100 %] 99 % (11/14 0812) Weight:  [301 lb 5.9 oz (136.7 kg)] 301 lb 5.9 oz (136.7 kg) (11/14 0505)  Hemodynamic parameters for last 24 hours:   atrial fibrillation, stable blood pressure, afebrile  Intake/Output from previous day: 11/13 0701 - 11/14 0700 In: 360 [P.O.:360] Out: 250 [Urine:250] Intake/Output this shift:    Alert and comfortable Moderate edema Abdomen soft Neuro intact Lungs clear  Lab Results:  Recent Labs  07/03/15 0158 07/04/15 0625  WBC 16.5* 12.9*  HGB 9.9* 8.9*  HCT 30.3* 27.3*  PLT PLATELET CLUMPS NOTED ON SMEAR, COUNT APPEARS ADEQUATE 227   BMET:  Recent Labs  07/03/15 0158 07/04/15 0625  NA 130* 131*  K 5.4* 4.1  CL 96* 94*  CO2 22 30  GLUCOSE 125* 129*  BUN 17 23*  CREATININE 0.92 0.97  CALCIUM 8.2* 8.3*    PT/INR: No results for input(s): LABPROT, INR in the last 72 hours. ABG    Component Value Date/Time   PHART 7.404 07/01/2015 0455   HCO3 23.3 07/01/2015 0455   TCO2 26 07/01/2015 1652   ACIDBASEDEF 0.9 07/01/2015 0455   O2SAT 95.1 07/01/2015 0455   CBG (last 3)   Recent Labs  07/03/15 1617 07/03/15 2055 07/04/15 0625  GLUCAP 121* 111*  121*    Assessment/Plan: S/P Procedure(s) (LRB): CORONARY ARTERY BYPASS GRAFTING (CABG)X2 SEQ LIMA-DIAG-LAD (N/A) TRANSESOPHAGEAL ECHOCARDIOGRAM (TEE) (N/A) MAZE (N/A) Mobilize Diuresis Continue low-dose amiodarone-patient intolerant of higher dose Continue IV Cardizem until patient converts to sinus rhythm and leave EPW's  LOS: 4 days    Harold Scott 07/04/2015

## 2015-07-04 NOTE — Progress Notes (Signed)
Patient has home CPAP, wires look good. No frays. Patient has home tubing and mask. Placed self on with 4 LPM bled in.

## 2015-07-04 NOTE — Progress Notes (Signed)
Utilization review completed.  

## 2015-07-04 NOTE — Progress Notes (Signed)
Pt encouraged to ambulate, pt states "he is wiped out today" does not want to walk at this time will continue to encourage will monitor patient, patient back in bed. Stephania Macfarlane, Bettina Gavia  RN

## 2015-07-04 NOTE — Care Management Note (Signed)
Case Management Note Marvetta Gibbons RN, BSN Unit 2W-Case Manager [phone]  Patient Details  Name: Harold Scott MRN: [id number] Date of Birth: 1961/05/24  Subjective/Objective:    Pt s/p CABG x2                Action/Plan: PTA pt lived at home with spouse- anticipate return home when medically stable  Expected Discharge Date:                  Expected Discharge Plan:  Home/Self Care  In-House Referral:     Discharge planning Services  CM Consult  Post Acute Care Choice:    Choice offered to:     DME Arranged:    DME Agency:     HH Arranged:    HH Agency:     Status of Service:  In process, will continue to follow  Medicare Important Message Given:    Date Medicare IM Given:    Medicare IM give by:    Date Additional Medicare IM Given:    Additional Medicare Important Message give by:     If discussed at [place] of Stay Meetings, dates discussed:  07/05/15  Additional Comments:  Dawayne Patricia, RN 07/04/2015, 10:43 AM

## 2015-07-04 NOTE — Progress Notes (Signed)
Nursing note Patient stated still feeling "wiped out",VSS, bp 122/87 heart rate Atrial fib 68 on monitor sats 100% on 4L [place], patient  with concerns about medication he is on, called concerns to PA on call and upon call back orders received. Will continue to monitor patient. Cloer, Bettina Gavia RN

## 2015-07-05 LAB — CBC
HCT: 28.2 % — ABNORMAL LOW (ref 39.0–52.0)
Hemoglobin: 8.9 g/dL — ABNORMAL LOW (ref 13.0–17.0)
MCH: 30.2 pg (ref 26.0–34.0)
MCHC: 31.6 g/dL (ref 30.0–36.0)
MCV: 95.6 fL (ref 78.0–100.0)
Platelets: 250 10*3/uL (ref 150–400)
RBC: 2.95 MIL/uL — ABNORMAL LOW (ref 4.22–5.81)
RDW: 14.1 % (ref 11.5–15.5)
WBC: 10.8 10*3/uL — ABNORMAL HIGH (ref 4.0–10.5)

## 2015-07-05 LAB — BASIC METABOLIC PANEL
Anion gap: 7 (ref 5–15)
BUN: 17 mg/dL (ref 6–20)
CO2: 31 mmol/L (ref 22–32)
Calcium: 8.3 mg/dL — ABNORMAL LOW (ref 8.9–10.3)
Chloride: 94 mmol/L — ABNORMAL LOW (ref 101–111)
Creatinine, Ser: 0.87 mg/dL (ref 0.61–1.24)
GFR calc Af Amer: >60 mL/min (ref >60)
GFR calc non Af Amer: >60 mL/min (ref >60)
Glucose, Bld: 110 mg/dL — ABNORMAL HIGH (ref 65–99)
Potassium: 4.1 mmol/L (ref 3.5–5.1)
Sodium: 132 mmol/L — ABNORMAL LOW (ref 135–145)

## 2015-07-05 LAB — GLUCOSE, CAPILLARY
Glucose-Capillary: 110 mg/dL — ABNORMAL HIGH (ref 65–99)
Glucose-Capillary: 108 mg/dL — ABNORMAL HIGH (ref 65–99)
Glucose-Capillary: 115 mg/dL — ABNORMAL HIGH (ref 65–99)
Glucose-Capillary: 108 mg/dL — ABNORMAL HIGH (ref 65–99)

## 2015-07-05 MED ORDER — FUROSEMIDE 10 MG/ML IJ SOLN
40.0000 mg | Freq: Once | INTRAMUSCULAR | Status: AC
  Administered 2015-07-05: 40 mg via INTRAVENOUS
  Filled 2015-07-05: qty 4

## 2015-07-05 MED ORDER — POTASSIUM CHLORIDE CRYS ER 20 MEQ PO TBCR
20.0000 meq | EXTENDED_RELEASE_TABLET | Freq: Once | ORAL | Status: AC
  Administered 2015-07-05: 20 meq via ORAL
  Filled 2015-07-05: qty 1

## 2015-07-05 NOTE — Progress Notes (Signed)
Pt c/o being very tired and short of breath. Pt states he could feel that he was still in atrial fib. Pt had cpap on and pt wanted to try to sleep.   RN will continue to monitor.  Harold Scott

## 2015-07-05 NOTE — Plan of Care (Signed)
Problem: Education: Goal: Ability to demonstrate proper wound care will improve Outcome: Not Progressing Even with frequent cues pt continues to break sternal precaution. RN reeducated pt on not using arms to get out of bed.

## 2015-07-05 NOTE — Progress Notes (Signed)
Epicardial Pacing Wires d/c'd per order and per protocol. Tips intact. Pt educated on need for q15 minute vitals and bedrest for one hour. Call bell within reach. Will continue to monitor.

## 2015-07-05 NOTE — Anesthesia Postprocedure Evaluation (Signed)
  Anesthesia Post-op Note  Patient: Harold Scott  Procedure(s) Performed: Procedure(s): CORONARY ARTERY BYPASS GRAFTING (CABG)X2 SEQ LIMA-DIAG-LAD (N/A) TRANSESOPHAGEAL ECHOCARDIOGRAM (TEE) (N/A) MAZE (N/A)  Patient Location: PACU and ICU  Anesthesia Type:General  Level of Consciousness: sedated  Airway and Oxygen Therapy: Patient remains intubated per anesthesia plan  Post-op Pain: mild  Post-op Assessment: Post-op Vital signs reviewed              Post-op Vital Signs: Reviewed  Last Vitals:  Filed Vitals:   07/04/15 2039  BP: 125/79  Pulse:   Temp: 36.7 C  Resp: 18    Complications: No apparent anesthesia complications

## 2015-07-05 NOTE — Progress Notes (Signed)
CARDIAC REHAB PHASE I   PRE:  Rate/Rhythm: 92 afib  BP:  Supine:   Sitting: 142/106  Standing:    SaO2:98% 3L   MODE:  Ambulation: 300 ft   POST:  Rate/Rhythm: 116 afib  BP:  Supine:   Sitting: 145/95  Standing:    SaO2: 98% 2L 1258-1330 Pt encouraged to walk as he was hesitant. Discussed with pt reasons to walk. Pt walked 300 ft on 2L with rolling walker and minimal asst. Encouraged pt to rest as needed and use purse lip breathing. Pt stated he was lightheaded upon standing but main complaint was SOB. Left on 2L as sats good on 2L after walk. Encouraged him to walk with staff later and to use IS and flutter valve.   Graylon Good, RN BSN  07/05/2015 1:25 PM

## 2015-07-05 NOTE — Progress Notes (Signed)
Patient wore CPAP last night, but stated he did not wish to wear tonight. RT made patient aware that if he changed his mind to call and we would place him on home CPAP.

## 2015-07-05 NOTE — Progress Notes (Addendum)
      [address]       [place] [address]             [phone]      5 Days Post-Op Procedure(s) (LRB): CORONARY ARTERY BYPASS GRAFTING (CABG)X2 SEQ LIMA-DIAG-LAD (N/A) TRANSESOPHAGEAL ECHOCARDIOGRAM (TEE) (N/A) MAZE (N/A)   Subjective:  Harold Scott continues to feel "lousy"  He states he is tired of sitting in the chair.  He is not ambulating because he gets lightheaded and feels weak due to his rhythm  Objective: Vital signs in last 24 hours: Temp:  [97.6 F (36.4 C)-98 F (36.7 C)] 97.6 F (36.4 C) (11/15 0305) Pulse Rate:  [68-92] 92 (11/15 0802) Cardiac Rhythm:  [-] Sinus tachycardia (11/15 0700) Resp:  [18-20] 18 (11/15 0305) BP: (122-137)/(79-87) 137/81 mmHg (11/15 0305) SpO2:  [97 %-100 %] 97 % (11/15 0305) Weight:  [301 lb 8 oz (136.76 kg)] 301 lb 8 oz (136.76 kg) (11/15 0305)  Intake/Output from previous day: 11/14 0701 - 11/15 0700 In: 204.2 [P.O.:120; I.V.:84.2] Out: -   General appearance: alert, cooperative and no distress Heart: irregularly irregular rhythm Lungs: clear to auscultation bilaterally Abdomen: soft, non-tender; bowel sounds normal; no masses,  no organomegaly Extremities: edema trace Wound: clean and dry  Lab Results:  Recent Labs  07/04/15 0625 07/05/15 0306  WBC 12.9* 10.8*  HGB 8.9* 8.9*  HCT 27.3* 28.2*  PLT 227 250   BMET:  Recent Labs  07/04/15 0625 07/05/15 0306  NA 131* 132*  K 4.1 4.1  CL 94* 94*  CO2 30 31  GLUCOSE 129* 110*  BUN 23* 17  CREATININE 0.97 0.87  CALCIUM 8.3* 8.3*    PT/INR: No results for input(s): LABPROT, INR in the last 72 hours. ABG    Component Value Date/Time   PHART 7.404 07/01/2015 0455   HCO3 23.3 07/01/2015 0455   TCO2 26 07/01/2015 1652   ACIDBASEDEF 0.9 07/01/2015 0455   O2SAT 95.1 07/01/2015 0455   CBG (last 3)   Recent Labs  07/04/15 1616 07/04/15 2051 07/05/15 0614  GLUCAP 98 103* 110*    Assessment/Plan: S/P Procedure(s) (LRB): CORONARY  ARTERY BYPASS GRAFTING (CABG)X2 SEQ LIMA-DIAG-LAD (N/A) TRANSESOPHAGEAL ECHOCARDIOGRAM (TEE) (N/A) MAZE (N/A)  1. CV- Atrial Fibrillation- Harold Scott did not tolerate Cardizem drip, asked Cardiology to see yesterday- continue Amiodarone, Lopressor.. Eliquis to be started at discharge? 2. Pulm- wean oxygen as tolerated 3. Renal- creatinine stable, remains hypervolemic continue lasix 4. Dm- borderline, Hgb A1c is 6.4, will need outpatient follow up, will d/c SSIP  5. Dispo- Harold Scott with continued Atrial Fibrillation, asked Cardiology to see, continue current care for now   LOS: 5 days    BARRETT, ERIN 07/05/2015  I have seen and examined the Harold Scott and agree with the assessment and plan as outlined.  However, atrial lead EKG demonstrates what may be atypical atrial flutter.  Attempted to RAP at bedside unsuccessful.  I favor keeping Harold Scott on amiodarone with plans for DCCV in 4-6 weeks if he doesn't convert spontaneously during the interim period of time.  At present rate-control is satisfactory - Harold Scott has not tolerated diltiazem or pushing beta blocker doses much higher in the past.  D/C pacing wires today and restart Eliquis tomorrow.  Weight is still 4-5 kg above baseline - will give extra dose lasix this evening.  D/C home in 2-3 days if he improves and rate-control remains satisfactory.  Rexene Alberts, MD 07/05/2015 10:08 AM

## 2015-07-06 ENCOUNTER — Other Ambulatory Visit: Payer: Self-pay | Admitting: Family Medicine

## 2015-07-06 LAB — GLUCOSE, CAPILLARY
GLUCOSE-CAPILLARY: 109 mg/dL — AB (ref 65–99)
Glucose-Capillary: 114 mg/dL — ABNORMAL HIGH (ref 65–99)
Glucose-Capillary: 105 mg/dL — ABNORMAL HIGH (ref 65–99)
Glucose-Capillary: 107 mg/dL — ABNORMAL HIGH (ref 65–99)

## 2015-07-06 MED ORDER — APIXABAN 5 MG PO TABS
5.0000 mg | ORAL_TABLET | Freq: Two times a day (BID) | ORAL | Status: DC
  Administered 2015-07-06 – 2015-07-07 (×3): 5 mg via ORAL
  Filled 2015-07-06 (×3): qty 1

## 2015-07-06 MED ORDER — ASPIRIN EC 81 MG PO TBEC
81.0000 mg | DELAYED_RELEASE_TABLET | Freq: Every day | ORAL | Status: DC
  Administered 2015-07-06 – 2015-07-07 (×2): 81 mg via ORAL
  Filled 2015-07-06 (×2): qty 1

## 2015-07-06 MED ORDER — FUROSEMIDE 40 MG PO TABS
40.0000 mg | ORAL_TABLET | Freq: Every day | ORAL | Status: DC
  Administered 2015-07-07: 40 mg via ORAL
  Filled 2015-07-06: qty 1

## 2015-07-06 MED ORDER — FUROSEMIDE 10 MG/ML IJ SOLN
40.0000 mg | Freq: Two times a day (BID) | INTRAMUSCULAR | Status: AC
  Administered 2015-07-06 (×2): 40 mg via INTRAVENOUS
  Filled 2015-07-06 (×2): qty 4

## 2015-07-06 MED ORDER — LISINOPRIL 5 MG PO TABS
5.0000 mg | ORAL_TABLET | Freq: Every day | ORAL | Status: DC
  Administered 2015-07-06 – 2015-07-07 (×2): 5 mg via ORAL
  Filled 2015-07-06 (×2): qty 1

## 2015-07-06 NOTE — Progress Notes (Addendum)
      [address]       [place] [address]             [phone]      6 Days Post-Op Procedure(s) (LRB): CORONARY ARTERY BYPASS GRAFTING (CABG)X2 SEQ LIMA-DIAG-LAD (N/A) TRANSESOPHAGEAL ECHOCARDIOGRAM (TEE) (N/A) MAZE (N/A)   Subjective:  Mr. Fouty states he feels a little better this morning.  He remains tired.  He did ambulate twice yesterday.  +BM  Objective: Vital signs in last 24 hours: Temp:  [98.4 F (36.9 C)-98.6 F (37 C)] 98.4 F (36.9 C) (11/16 0431) Pulse Rate:  [78-116] 116 (11/16 0431) Cardiac Rhythm:  [-] Atrial fibrillation (11/16 0700) Resp:  [18-19] 18 (11/16 0431) BP: (122-144)/(74-109) 122/74 mmHg (11/16 0431) SpO2:  [97 %-98 %] 98 % (11/16 0431) Weight:  [295 lb 6.7 oz (134 kg)] 295 lb 6.7 oz (134 kg) (11/16 0215)  Intake/Output from previous day: 11/15 0701 - 11/16 0700 In: 420 [P.O.:420] Out: 550 [Urine:550]  General appearance: alert, cooperative and no distress Heart: regular rate and rhythm Lungs: clear to auscultation bilaterally Abdomen: soft, non-tender; bowel sounds normal; no masses,  no organomegaly Extremities: edema trace Wound: clean and dry  Lab Results:  Recent Labs  07/04/15 0625 07/05/15 0306  WBC 12.9* 10.8*  HGB 8.9* 8.9*  HCT 27.3* 28.2*  PLT 227 250   BMET:  Recent Labs  07/04/15 0625 07/05/15 0306  NA 131* 132*  K 4.1 4.1  CL 94* 94*  CO2 30 31  GLUCOSE 129* 110*  BUN 23* 17  CREATININE 0.97 0.87  CALCIUM 8.3* 8.3*    PT/INR: No results for input(s): LABPROT, INR in the last 72 hours. ABG    Component Value Date/Time   PHART 7.404 07/01/2015 0455   HCO3 23.3 07/01/2015 0455   TCO2 26 07/01/2015 1652   ACIDBASEDEF 0.9 07/01/2015 0455   O2SAT 95.1 07/01/2015 0455   CBG (last 3)   Recent Labs  07/05/15 1626 07/05/15 2039 07/06/15 0612  GLUCAP 115* 108* 109*    Assessment/Plan: S/P Procedure(s) (LRB): CORONARY ARTERY BYPASS GRAFTING (CABG)X2 SEQ LIMA-DIAG-LAD  (N/A) TRANSESOPHAGEAL ECHOCARDIOGRAM (TEE) (N/A) MAZE (N/A)  1. CV- continues to have Atypical Atrial Flutter- continue Amiodarone, Lopressor. Will restart Eliquis, decrease ASA to 81 mg 2. Pulm- off oxygen, continue IS, CPAP nightly as home use 3. Renal- creatinine stable, weight is down 5 lbs after IV lasix, will repeat dose today 4. DM- borderline diabetic, preop A1c is 6.4, adequately controlled with diet in hospital, will need to follow up with PCP 5. Dispo- patient stable, feeling better, will restart Eliquis, if remains stable possibly home in AM   LOS: 6 days    BARRETT, ERIN 07/06/2015  I have seen and examined the patient and agree with the assessment and plan as outlined.  Tentatively plan d/c home in am tomorrow.  Rexene Alberts, MD 07/06/2015 9:59 AM

## 2015-07-06 NOTE — Discharge Summary (Signed)
Physician Discharge Summary  Patient ID: Harold Scott MRN: [id number] DOB/AGE: 01/03/1961 54 y.o.  Admit date: 06/30/2015 Discharge date: 07/07/2015  Admission Diagnoses: Atrial fibrilation         CAD  Discharge Diagnoses:  Principal Problem:   S/P Maze operation for atrial fibrillation + CABG x2 Active Problems:   Essential hypertension   Obstructive sleep apnea   Diastolic CHF, chronic (HCC)   Paroxysmal atrial fibrillation (HCC)   Atrial fibrillation, unspecified   A-fib (HCC)   Acute on chronic diastolic CHF (congestive heart failure) (HCC)   SOB (shortness of breath)   Morbid obesity (HCC)   Mitral regurgitation   Chronic diastolic (congestive) heart failure (HCC)   Unstable angina (HCC)   Coronary artery disease   S/P CABG x 2  Patient Active Problem List   Diagnosis Date Noted  . S/P Maze operation for atrial fibrillation + CABG x2 06/30/2015  . S/P CABG x 2 06/30/2015  . Coronary artery disease 06/20/2015  . Unstable angina ([place])   . Mitral regurgitation   . Hyperlipidemia   . Chronic diastolic (congestive) heart failure ([place])   . Acute respiratory failure with hypoxia ([place]) 12/22/2014  . Hypoxia 12/21/2014  . Acute on chronic diastolic CHF (congestive heart failure) ([place]) 12/21/2014  . SOB (shortness of breath) 12/21/2014  . PNA (pneumonia) 12/21/2014  . Morbid obesity ([place]) 12/21/2014  . Acute respiratory failure ([place]) 12/21/2014  . Pulmonary infiltrates   . A-fib ([place]) 09/21/2014  . Atrial fibrillation, unspecified   . Paroxysmal atrial fibrillation ([place]) 07/20/2014  . Chest pain 07/19/2014  . Hyponatremia 07/19/2014  . Leukocytosis 07/19/2014  . Hepatic steatosis 07/19/2014  . Diastolic CHF, chronic ([place]) 07/18/2014  . Asthma with acute exacerbation 07/28/2013  . Obesity 07/15/2013  . Essential hypertension 07/15/2013  . Obstructive sleep apnea 07/15/2013  . Tinnitus of both ears 06/23/2013  . Diarrhea 06/23/2013  . Weight gain 06/23/2013  .  Plantar fasciitis, right 06/23/2013  . Dysplastic nevus of face 11/30/2011  . Asthma exacerbation, allergic 11/28/2011  . Tachycardia 11/14/2011  . ANXIETY 09/09/2008  . GERD 09/09/2008  . HYPERLIPIDEMIA 08/03/2008  . NEPHROLITHIASIS, HX OF 08/03/2008    HPI: at time of consultation  Patient is a 54 year old morbidly obese male with history of hypertension, chronic diastolic congestive heart failure, recurrent paroxysmal atrial fibrillation, anxiety, and depression who has been referred for surgical consultation to discuss treatment options for management of atrial fibrillation refractory to medical therapy and previous catheter-based ablation 2. The patient first developed recurrent paroxysmal atrial fibrillation in 2014. Transthoracic echocardiogram performed at that time demonstrated normal left ventricular systolic function. Patient underwent an exercise treadmill stress test that was felt to be low risk. He was initially treated with metoprolol and anticoagulated using aspirin alone. And he was referred for a sleep study and diagnosed with obstructive sleep apnea. He has been using CPAP ever since. He continued to suffer from recurrent episodes of paroxysmal atrial fibrillation that made him very symptomatic. He was referred to the atrial fibrillation clinic treated for a period of time with flecainide and later Multaq, but he was intolerant of both. In 2015 he was hospitalized with acute exacerbation of chronic diastolic congestive heart failure associated with recurrent atrial fibrillation and epigastric chest pain. A stress my view exam was performed at that time that was felt to be low risk. He was loaded with Tikosyn but continued to experience recurrent episodes of paroxysmal atrial fibrillation that were associated with shortness of breath,  diaphoresis, fatigue, and atypical chest pain. He underwent catheter-based ablation for atrial fibrillation on 2 occasions in February and again in  May of this year, but each time he quickly developed recurrent episodes of paroxysmal atrial fibrillation. He was last seen in follow-up by [name] on 04/18/2015. At that time Tikosyn was discontinued and the patient was started on amiodarone. The patient has continued to experience intermittent episodes of atrial fibrillation on amiodarone therapy, although reportedly his heart rate has been under better control and symptoms have been tolerated somewhat better. The patient has now been referred for surgical consultation to discuss the possibility of maze procedure.  The patient is married and lives locally in [place] with his wife. He has 1 son who is 64 years old in college. The patient is currently working as a courier. He has a remote history of tobacco use although he quit smoking 15 years ago. He denies excessive alcohol consumption. The patient describes chronic symptoms of exertional shortness of breath, fatigue, and atypical chest discomfort that date back more than 2 years. Symptoms are dramatically worse when the patient is out of sinus rhythm, although the patient does experience some exertional shortness of breath even when he is in rhythm. The patient describes tightness across his chest that is typically worse in the mornings and is not necessarily associated with stress. He has mild lower extremity edema. He has palpitations when he is in atrial fibrillation. He has never had any dizzy spells or syncope. He has a chronic dry cough. He uses CPAP at night. He has been unsuccessful in any attempts associated with weight loss. He does not exercise on a regular basis.  Patient returned to the office  for follow-up of chronic recurrent paroxysmal atrial fibrillation that has failed medical therapy with multiple drugs and catheter-based ablation on 2 previous occasions. He was initially seen in consultation on 06/14/2015. The patient complains of chronic symptoms of exertional shortness of  breath, fatigue, and atypical chest discomfort that have accelerated in frequency and severity and are typically dramatically worse when the patient is out of sinus rhythm. Transthoracic and transesophageal echocardiograms demonstrate normal left ventricular systolic function with moderate left ventricular hypertrophy, moderate asymmetric septal hypertrophy, and moderate mitral regurgitation. Diagnostic cardiac catheterization performed last week demonstrates the presence of single vessel coronary artery disease with critical high-grade stenosis of the large first diagonal branch off of the left anterior descending coronary artery and at least 50% stenosis of the left anterior descending coronary artery. Catheterization was also notable for the presence of moderate pulmonary hypertension.   The patient was admitted this hospitalization for elective combined CABG and maze procedure.    Discharged Condition: good  Hospital Course: The patient was admitted electively and was taken to the operating room on 06/30/2015 and underwent the procedure described below. He tolerated the procedure well and was taken to the surgical intensive care unit in stable condition. Postoperatively the patient has overall done quite well. He has remained neurologically intact. He was weaned from the ventilator using the standard protocol without difficulty. All routine lines, monitors and drainage devices have been discontinued in the standard fashion. He does have some postoperative volume overload but has responded well to diuretics. The patient developed atypical atrial flutter postoperatively. Rate was controlled on amiodarone and beta blocker. Prior to hospital discharge he converted back to sinus rhythm. He has had postoperative fatigue and weakness which is steadily showing improvement. He is tolerating gradually increasing activities with standard cardiac rehabilitation  protocols. Incision is noted to be healing well  without evidence of infection. Oxygen has been weaned and he maintains good saturations on room air. He does use his C Pap at night as he normally does at home. He has been started on eliquis and baby aspirin for anticoagulation. Currently his status is felt to be tentatively stable for discharge in the next 24-48 hours pending ongoing reevaluation of his recovery.  Significant Diagnostic Studies: routine post-op labs/CXR  Treatments: surgery:   CARDIOTHORACIC SURGERY OPERATIVE NOTE  Date of Procedure:06/30/2015  Preoperative Diagnosis:  Recurrent Paroxysmal Atrial Fibrillation  Severe Single-vessel Coronary Artery Disease  Postoperative Diagnosis:Same  Procedure:   Maze Procedure complete bilateral atrial lesion set using bipolar radiofrequency and cryothermy ablation clipping of left atrial appendage (Atriclip size 19mm)   Coronary Artery Bypass Grafting x 2 Left Internal Mammary Artery to First Diagonal Branch and Distal Left Anterior Descending Coronary Artery, Sequentially  Surgeon:Clarence H. Roxy Manns, MD  Assistant:Wayne Orson Ape, PA-C  Anesthesia:Charlene Oletta Lamas, MD  Operative Findings:  Normal LV systolic function  Mild mitral regurgitation  Moderate pulmonary hypertension  Thick walled left atrium and pulmonary veins  Disposition: 01-Home or Self Care  medications at time of discharge:    Medication List    STOP taking these medications        losartan 50 MG tablet  Commonly known as:  COZAAR      TAKE these medications        amiodarone 200 MG tablet  Commonly known as:  PACERONE  Take 1 tablet (200 mg total) by mouth 2 (two) times daily.     aspirin 81 MG EC tablet  Take 1 tablet (81 mg total) by mouth daily.     cholestyramine 4 G packet  Commonly known as:  QUESTRAN  Take 4 g by mouth 2 (two) times daily.     cholestyramine 4 G packet  Commonly known as:   QUESTRAN  TAKE 2 PACKETS DAILY AS DIRECTED *PT NEEDS OFFICE VISIT FOR MORE REFILLS**     citalopram 10 MG tablet  Commonly known as:  CELEXA  TAKE 1 TABLET BY MOUTH ONCE DAILY     clorazepate 7.5 MG tablet  Commonly known as:  TRANXENE  TAKE 1 TABLET BY MOUTH AT BEDTIME     ELIQUIS 5 MG Tabs tablet  Generic drug:  apixaban  Take 5 mg by mouth 2 (two) times daily.     esomeprazole 40 MG capsule  Commonly known as:  NEXIUM  Take 40 mg by mouth daily at 12 noon.     furosemide 40 MG tablet  Commonly known as:  LASIX  Take 1 tablet (40 mg total) by mouth daily.     guaiFENesin 200 MG tablet  Take 200 mg by mouth 2 (two) times daily as needed (for cold).     lisinopril 5 MG tablet  Commonly known as:  PRINIVIL,ZESTRIL  Take 1 tablet (5 mg total) by mouth daily.     metoprolol succinate 25 MG 24 hr tablet  Commonly known as:  TOPROL-XL  Take 1 tablet (25 mg total) by mouth 2 (two) times daily.     oxyCODONE 5 MG immediate release tablet  Commonly known as:  Oxy IR/ROXICODONE  Take 1-2 tablets (5-10 mg total) by mouth every 3 (three) hours as needed for severe pain.     potassium chloride SA 20 MEQ tablet  Commonly known as:  K-DUR,KLOR-CON  Take 1 tablet (20 mEq total) by mouth daily.  simvastatin 20 MG tablet  Commonly known as:  ZOCOR  Take 1 tablet (20 mg total) by mouth daily at 6 PM.     traMADol 50 MG tablet  Commonly known as:  ULTRAM  Take 1-2 tablets (50-100 mg total) by mouth every 4 (four) hours as needed for moderate pain.         Follow-up Information    Follow up with Rexene Alberts, MD On 08/01/2015.   Specialty:  Cardiothoracic Surgery   Why:  Appointment is at 3:30, please get CXR around 2:45   Contact information:   [place] [address] [phone]       Follow up with Richardson Dopp, PA-C On 07/25/2015.   Specialties:  Physician Assistant, Radiology, Interventional Cardiology   Why:  Appointment is at 8:30    Contact information:   [address] [place] [address] [phone]      The patient has been discharged on:   1.Beta Blocker:  Yes [ y  ]                              No   [   ]                              If No, reason:  2.Ace Inhibitor/ARB: Yes Blue.Reese   ]                                     No  [    ]                                     If No, reason:  3.Statin:   Yes [ y  ]                  No  [   ]                  If No, reason:  4.Ecasa:  Yes  [ y]                  No   [   ]                  If No, reason:  Signed: BARRETT, ERIN 07/07/2015, 8:18 AM  Rexene Alberts, MD 07/07/2015 8:30 AM

## 2015-07-06 NOTE — Progress Notes (Signed)
CARDIAC REHAB PHASE I   PRE:  Rate/Rhythm: 106 afib  BP:  Supine:   Sitting: 113/80  Standing:    SaO2: 95%RA  MODE:  Ambulation: 550 ft   POST:  Rate/Rhythm: 121 afib  BP:  Supine:   Sitting: 116/76  Standing:    SaO2: 97%RA 0928-1005 Pt has turned the corner and feeling better. Able to walk 550 ft on RA with rolling walker independently. Stopped a couple of times to rest but not as dyspneic as he has been walking. Sats good on RA. Pt to walk next walk without walker to see if he needs one for home.  To recliner with legs up.    Graylon Good, RN BSN  07/06/2015 10:02 AM

## 2015-07-06 NOTE — Progress Notes (Signed)
RT note: Patient was already on home CPAP when RT walked in room.  Patient stated he didn't need anything for his CPAP.

## 2015-07-07 LAB — CULTURE, BLOOD (ROUTINE X 2)
CULTURE: NO GROWTH
Culture: NO GROWTH

## 2015-07-07 LAB — BASIC METABOLIC PANEL
Anion gap: 12 (ref 5–15)
BUN: 16 mg/dL (ref 6–20)
CHLORIDE: 89 mmol/L — AB (ref 101–111)
CO2: 30 mmol/L (ref 22–32)
CREATININE: 0.95 mg/dL (ref 0.61–1.24)
Calcium: 9.2 mg/dL (ref 8.9–10.3)
GFR calc non Af Amer: >60 mL/min (ref >60)
Glucose, Bld: 130 mg/dL — ABNORMAL HIGH (ref 65–99)
Potassium: 4.1 mmol/L (ref 3.5–5.1)
Sodium: 131 mmol/L — ABNORMAL LOW (ref 135–145)

## 2015-07-07 LAB — GLUCOSE, CAPILLARY: Glucose-Capillary: 106 mg/dL — ABNORMAL HIGH (ref 65–99)

## 2015-07-07 MED ORDER — OXYCODONE HCL 5 MG PO TABS: 5.0000 mg | ORAL_TABLET | ORAL | Status: DC | PRN

## 2015-07-07 MED ORDER — AMIODARONE HCL 200 MG PO TABS: 200.0000 mg | ORAL_TABLET | Freq: Two times a day (BID) | ORAL | Status: DC

## 2015-07-07 MED ORDER — LISINOPRIL 5 MG PO TABS: 5.0000 mg | ORAL_TABLET | Freq: Every day | ORAL | Status: DC

## 2015-07-07 MED ORDER — ASPIRIN 81 MG PO TBEC: 81.0000 mg | DELAYED_RELEASE_TABLET | Freq: Every day | ORAL | Status: DC

## 2015-07-07 MED ORDER — TRAMADOL HCL 50 MG PO TABS: 50.0000 mg | ORAL_TABLET | ORAL | Status: DC | PRN

## 2015-07-07 NOTE — Discharge Instructions (Signed)
Coronary Artery Bypass Grafting, Care After These instructions give you information on caring for yourself after your procedure. Your doctor may also give you more specific instructions. Call your doctor if you have any problems or questions after your procedure.  HOME CARE  Only take medicine as told by your doctor. Take medicines exactly as told. Do not stop taking medicines or start any new medicines without talking to your doctor first.  Take your pulse as told by your doctor.  Do deep breathing as told by your doctor. Use your breathing device (incentive spirometer), if given, to practice deep breathing several times a day. Support your chest with a pillow or your arms when you take deep breaths or cough.  Keep the area clean, dry, and protected where the surgery cuts (incisions) were made. Remove bandages (dressings) only as told by your doctor. If strips were applied to surgical area, do not take them off. They fall off on their own.  Check the surgery area daily for puffiness (swelling), redness, or leaking fluid.  If surgery cuts were made in your legs:  Avoid crossing your legs.  Avoid sitting for long periods of time. Change positions every 30 minutes.  Raise your legs when you are sitting. Place them on pillows.  Wear stockings that help keep blood clots from forming in your legs (compression stockings).  Only take sponge baths until your doctor says it is okay to take showers. Pat the surgery area dry. Do not rub the surgery area with a washcloth or towel. Do not bathe, swim, or use a hot tub until your doctor says it is okay.  Eat foods that are high in fiber. These include raw fruits and vegetables, whole grains, beans, and nuts. Choose lean meats. Avoid canned, processed, and fried foods.  Drink enough fluids to keep your pee (urine) clear or pale yellow.  Weigh yourself every day.  Rest and limit activity as told by your doctor. You may be told to:  Stop any  activity if you have chest pain, shortness of breath, changes in heartbeat, or dizziness. Get help right away if this happens.  Move around often for short amounts of time or take short walks as told by your doctor. Gradually become more active. You may need help to strengthen your muscles and build endurance.  Avoid lifting, pushing, or pulling anything heavier than 10 pounds (4.5 kg) for at least 6 weeks after surgery.  Do not drive until your doctor says it is okay.  Ask your doctor when you can go back to work.  Ask your doctor when you can begin sexual activity again.  Follow up with your doctor as told. GET HELP IF:  You have puffiness, redness, more pain, or fluid draining from the incision site.  You have a fever.  You have puffiness in your ankles or legs.  You have pain in your legs.  You gain 2 or more pounds (0.9 kg) a day.  You feel sick to your stomach (nauseous) or throw up (vomit).  You have watery poop (diarrhea). GET HELP RIGHT AWAY IF:  You have chest pain that goes to your jaw or arms.  You have shortness of breath.  You have a fast or irregular heartbeat.  You notice a "clicking" in your breastbone when you move.  You have numbness or weakness in your arms or legs.  You feel dizzy or light-headed. MAKE SURE YOU:  Understand these instructions.  Will watch your condition.  Will get  help right away if you are not doing well or get worse.   This information is not intended to replace advice given to you by your health care provider. Make sure you discuss any questions you have with your health care provider.   Document Released: 08/11/2013 Document Reviewed: 08/11/2013 Elsevier Interactive Patient Education 2016 [employer].  Diabetes Mellitus and Food It is important for you to manage your blood sugar (glucose) level. Your blood glucose level can be greatly affected by what you eat. Eating healthier foods in the appropriate amounts  throughout the day at about the same time each day will help you control your blood glucose level. It can also help slow or prevent worsening of your diabetes mellitus. Healthy eating may even help you improve the level of your blood pressure and reach or maintain a healthy weight.  General recommendations for healthful eating and cooking habits include:  Eating meals and snacks regularly. Avoid going long periods of time without eating to lose weight.  Eating a diet that consists mainly of plant-based foods, such as fruits, vegetables, nuts, legumes, and whole grains.  Using low-heat cooking methods, such as baking, instead of high-heat cooking methods, such as deep frying. Work with your dietitian to make sure you understand how to use the Nutrition Facts information on food labels. HOW CAN FOOD AFFECT ME? Carbohydrates Carbohydrates affect your blood glucose level more than any other type of food. Your dietitian will help you determine how many carbohydrates to eat at each meal and teach you how to count carbohydrates. Counting carbohydrates is important to keep your blood glucose at a healthy level, especially if you are using insulin or taking certain medicines for diabetes mellitus. Alcohol Alcohol can cause sudden decreases in blood glucose (hypoglycemia), especially if you use insulin or take certain medicines for diabetes mellitus. Hypoglycemia can be a life-threatening condition. Symptoms of hypoglycemia (sleepiness, dizziness, and disorientation) are similar to symptoms of having too much alcohol.  If your health care provider has given you approval to drink alcohol, do so in moderation and use the following guidelines:  Women should not have more than one drink per day, and men should not have more than two drinks per day. One drink is equal to:  12 oz of beer.  5 oz of wine.  1 oz of hard liquor.  Do not drink on an empty stomach.  Keep yourself hydrated. Have water, diet  soda, or unsweetened iced tea.  Regular soda, juice, and other mixers might contain a lot of carbohydrates and should be counted. WHAT FOODS ARE NOT RECOMMENDED? As you make food choices, it is important to remember that all foods are not the same. Some foods have fewer nutrients per serving than other foods, even though they might have the same number of calories or carbohydrates. It is difficult to get your body what it needs when you eat foods with fewer nutrients. Examples of foods that you should avoid that are high in calories and carbohydrates but low in nutrients include:  Trans fats (most processed foods list trans fats on the Nutrition Facts label).  Regular soda.  Juice.  Candy.  Sweets, such as cake, pie, doughnuts, and cookies.  Fried foods. WHAT FOODS CAN I EAT? Eat nutrient-rich foods, which will nourish your body and keep you healthy. The food you should eat also will depend on several factors, including:  The calories you need.  The medicines you take.  Your weight.  Your blood glucose level.  Your blood pressure level.  Your cholesterol level. You should eat a variety of foods, including:  Protein.  Lean cuts of meat.  Proteins low in saturated fats, such as fish, egg whites, and beans. Avoid processed meats.  Fruits and vegetables.  Fruits and vegetables that may help control blood glucose levels, such as apples, mangoes, and yams.  Dairy products.  Choose fat-free or low-fat dairy products, such as milk, yogurt, and cheese.  Grains, bread, pasta, and rice.  Choose whole grain products, such as multigrain bread, whole oats, and brown rice. These foods may help control blood pressure.  Fats.  Foods containing healthful fats, such as nuts, avocado, olive oil, canola oil, and fish. DOES EVERYONE WITH DIABETES MELLITUS HAVE THE SAME MEAL PLAN? Because every person with diabetes mellitus is different, there is not one meal plan that works for  everyone. It is very important that you meet with a dietitian who will help you create a meal plan that is just right for you.   This information is not intended to replace advice given to you by your health care provider. Make sure you discuss any questions you have with your health care provider.   Document Released: 05/03/2005 Document Revised: 08/27/2014 Document Reviewed: 07/03/2013 Elsevier Interactive Patient Education 2016 [employer] Carbohydrate Counting for Diabetes Mellitus Carbohydrate counting is a method for keeping track of the amount of carbohydrates you eat. Eating carbohydrates naturally increases the level of sugar (glucose) in your blood, so it is important for you to know the amount that is okay for you to have in every meal. Carbohydrate counting helps keep the level of glucose in your blood within normal limits. The amount of carbohydrates allowed is different for every person. A dietitian can help you calculate the amount that is right for you. Once you know the amount of carbohydrates you can have, you can count the carbohydrates in the foods you want to eat. Carbohydrates are found in the following foods:  Grains, such as breads and cereals.  Dried beans and soy products.  Starchy vegetables, such as potatoes, peas, and corn.  Fruit and fruit juices.  Milk and yogurt.  Sweets and snack foods, such as cake, cookies, candy, chips, soft drinks, and fruit drinks. CARBOHYDRATE COUNTING There are two ways to count the carbohydrates in your food. You can use either of the methods or a combination of both. Reading the "Nutrition Facts" on [place] The "Nutrition Facts" is an area that is included on the labels of almost all packaged food and beverages in the [place]. It includes the serving size of that food or beverage and information about the nutrients in each serving of the food, including the grams (g) of carbohydrate per serving.  Decide the  number of servings of this food or beverage that you will be able to eat or drink. Multiply that number of servings by the number of grams of carbohydrate that is listed on the label for that serving. The total will be the amount of carbohydrates you will be having when you eat or drink this food or beverage. Learning Standard Serving Sizes of Food When you eat food that is not packaged or does not include "Nutrition Facts" on the label, you need to measure the servings in order to count the amount of carbohydrates.A serving of most carbohydrate-rich foods contains about 15 g of carbohydrates. The following list includes serving sizes of carbohydrate-rich foods that provide 15 g ofcarbohydrate per serving:  1 slice of bread (1 oz) or 1 six-inch tortilla.    of a hamburger bun or English muffin.  4-6 crackers.   cup unsweetened dry cereal.    cup hot cereal.   cup rice or pasta.    cup mashed potatoes or  of a large baked potato.  1 cup fresh fruit or one small piece of fruit.    cup canned or frozen fruit or fruit juice.  1 cup milk.   cup plain fat-free yogurt or yogurt sweetened with artificial sweeteners.   cup cooked dried beans or starchy vegetable, such as peas, corn, or potatoes.  Decide the number of standard-size servings that you will eat. Multiply that number of servings by 15 (the grams of carbohydrates in that serving). For example, if you eat 2 cups of strawberries, you will have eaten 2 servings and 30 g of carbohydrates (2 servings x 15 g = 30 g). For foods such as soups and casseroles, in which more than one food is mixed in, you will need to count the carbohydrates in each food that is included. EXAMPLE OF CARBOHYDRATE COUNTING Sample Dinner  3 oz chicken breast.   cup of brown rice.   cup of corn.  1 cup milk.   1 cup strawberries with sugar-free whipped topping.  Carbohydrate Calculation Step 1: Identify the foods that contain  carbohydrates:   Rice.   Corn.   Milk.   Strawberries. Step 2:Calculate the number of servings eaten of each:   2 servings of rice.   1 serving of corn.   1 serving of milk.   1 serving of strawberries. Step 3: Multiply each of those number of servings by 15 g:   2 servings of rice x 15 g = 30 g.   1 serving of corn x 15 g = 15 g.   1 serving of milk x 15 g = 15 g.   1 serving of strawberries x 15 g = 15 g. Step 4: Add together all of the amounts to find the total grams of carbohydrates eaten: 30 g + 15 g + 15 g + 15 g = 75 g.   This information is not intended to replace advice given to you by your health care provider. Make sure you discuss any questions you have with your health care provider.   Document Released: 08/06/2005 Document Revised: 08/27/2014 Document Reviewed: 07/03/2013 Elsevier Interactive Patient Education [employer].

## 2015-07-07 NOTE — Progress Notes (Signed)
Upon review of chart and pt strips, pt was found to be in SR. EKG confirmed SR.

## 2015-07-07 NOTE — Care Management Note (Addendum)
Case Management Note Marvetta Gibbons RN, BSN Unit 2W-Case Manager [phone]  Patient Details  Name: Harold Scott MRN: [id number] Date of Birth: 1961/06/27  Subjective/Objective:    Pt s/p CABG x2                Action/Plan: PTA pt lived at home with spouse- anticipate return home when medically stable  Expected Discharge Date:                  Expected Discharge Plan:  Home/Self Care  In-House Referral:     Discharge planning Services  CM Consult  Post Acute Care Choice:    Choice offered to:     DME Arranged:   Vassie Moselle DME Agency:   [facility]  HH Arranged:    [place] Agency:     Status of Service:  Complete, will sign off  Medicare Important Message Given:    Date Medicare IM Given:    Medicare IM give by:    Date Additional Medicare IM Given:    Additional Medicare Important Message give by:     If discussed at [place] of Stay Meetings, dates discussed:  07/05/15  Additional Comments: Pt assessed pt with wife at bedside, pt will return home with wife.  Per [facility]; pt could benefit from a rolling walker, pt checked to see if he could borrow one but was unable to do so.  Pt offered choice for DME, pt chose [facility], agency contacted and referral accepted.  CM asked bedside nurse to obtain DME order. Maryclare Labrador, RN 07/07/2015, 9:44 AM

## 2015-07-07 NOTE — Progress Notes (Addendum)
      [address]       [place] [address]             [phone]      7 Days Post-Op Procedure(s) (LRB): CORONARY ARTERY BYPASS GRAFTING (CABG)X2 SEQ LIMA-DIAG-LAD (N/A) TRANSESOPHAGEAL ECHOCARDIOGRAM (TEE) (N/A) MAZE (N/A)   Subjective:  Harold Scott is feeling better this morning.  He converted to NSR yesterday evening.  + ambulation  Objective: Vital signs in last 24 hours: Temp:  [98.4 F (36.9 C)-99.2 F (37.3 C)] 98.4 F (36.9 C) (11/17 0513) Pulse Rate:  [63-116] 63 (11/17 0513) Cardiac Rhythm:  [-] Normal sinus rhythm (11/16 1900) Resp:  [18-20] 18 (11/17 0513) BP: (114-118)/(61-84) 114/61 mmHg (11/17 0513) SpO2:  [92 %-96 %] 96 % (11/17 0513) Weight:  [292 lb 15.9 oz (132.9 kg)] 292 lb 15.9 oz (132.9 kg) (11/17 0100)  Intake/Output from previous day: 11/16 0701 - 11/17 0700 In: 582 [P.O.:582] Out: 1000 [Urine:1000]  General appearance: alert, cooperative and no distress Heart: regular rate and rhythm Lungs: clear to auscultation bilaterally Abdomen: soft, non-tender; bowel sounds normal; no masses,  no organomegaly Extremities: edema trace Wound: clean and dry  Lab Results:  Recent Labs  07/05/15 0306  WBC 10.8*  HGB 8.9*  HCT 28.2*  PLT 250   BMET:  Recent Labs  07/05/15 0306 07/07/15 0247  NA 132* 131*  K 4.1 4.1  CL 94* 89*  CO2 31 30  GLUCOSE 110* 130*  BUN 17 16  CREATININE 0.87 0.95  CALCIUM 8.3* 9.2    PT/INR: No results for input(s): LABPROT, INR in the last 72 hours. ABG    Component Value Date/Time   PHART 7.404 07/01/2015 0455   HCO3 23.3 07/01/2015 0455   TCO2 26 07/01/2015 1652   ACIDBASEDEF 0.9 07/01/2015 0455   O2SAT 95.1 07/01/2015 0455   CBG (last 3)   Recent Labs  07/06/15 1638 07/06/15 2133 07/07/15 0614  GLUCAP 105* 107* 106*    Assessment/Plan: S/P Procedure(s) (LRB): CORONARY ARTERY BYPASS GRAFTING (CABG)X2 SEQ LIMA-DIAG-LAD (N/A) TRANSESOPHAGEAL ECHOCARDIOGRAM (TEE) (N/A) MAZE  (N/A)  1. CV- converted to NSR around 8pm last night- continue Amiodarone, Lopressor, Eliquis, ASA 2. Pulm- off oxygen, no acute issues, continue IS 3. Renal- creatinine stable, weight down, will continue diuretics 4. Borderline DM- will provide patient information on diabetic meals, follow up with PCP 5. Dispo- patient stable, has converted to NSR, will d/c home today   LOS: 7 days    BARRETT, ERIN 07/07/2015  I have seen and examined the patient and agree with the assessment and plan as outlined.  Rexene Alberts, MD 07/07/2015 8:31 AM

## 2015-07-07 NOTE — Progress Notes (Signed)
07/07/2015 11:31 AM Discharge AVS meds taken today and those due this evening reviewed.  Follow-up appointments and when to call md reviewed.  D/C IV and TELE.  Questions and concerns addressed.   D/C home per orders. Carney Corners

## 2015-07-07 NOTE — Progress Notes (Signed)
[id number] Education completed with pt and wife who voiced understanding. Sternal precautions reviewed, IS/flutter valve encouraged. Discussed with pt that his A1C was 6.4 and he needed to watch carbs, ex, try to lose weight and to follow up with medical doctor. Discussed CRP 2 and will refer to [facility]. Put on discharge video for pt and wife to view. Graylon Good RN BSN 07/07/2015 9:58 AM

## 2015-07-07 NOTE — Progress Notes (Signed)
07/07/2015 8:46 AM Chest Tube sutures removed, pt tolerated well. Carney Corners

## 2015-07-14 ENCOUNTER — Other Ambulatory Visit: Payer: Self-pay | Admitting: Family Medicine

## 2015-07-14 ENCOUNTER — Other Ambulatory Visit: Payer: Self-pay | Admitting: Physician Assistant

## 2015-07-18 ENCOUNTER — Telehealth: Payer: Self-pay

## 2015-07-18 ENCOUNTER — Ambulatory Visit: Payer: PRIVATE HEALTH INSURANCE | Admitting: Internal Medicine

## 2015-07-18 ENCOUNTER — Telehealth: Payer: Self-pay | Admitting: Physician Assistant

## 2015-07-18 NOTE — Telephone Encounter (Signed)
New Message     Pt calling stating he has been out of rhythm for 9 straight days since his procedure and he wants to know if there is anything that can be done. Please call back and advise.

## 2015-07-18 NOTE — Telephone Encounter (Signed)
I received this message this morning in my basket. I will route this to the Triage Pool so pt may be assessed by triage nurse.

## 2015-07-18 NOTE — Telephone Encounter (Signed)
Ok to refill this under Dr [name]?

## 2015-07-18 NOTE — Telephone Encounter (Signed)
Calling stating that he has been out of rhythm since d/c from hospital on 11/17.  States HR has been in the 80's but irregular.  Has a phone EKG that shows in AF-Atrial flutter.  States he is walking 30 min/day and after walking about 15 min gets SOB.  States the feeling is not debilitating but wants to know if anything can be done.  Spoke w/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 Would recommend no changes at this time and see if time will allow healing.  Scheduled to see Margaret Pyle on 12/5. Reassured pt that he is still in recovery period and HR is controlled.  Advised if develops symptoms of SOB, dizziness or CP to call us back.  He verbalizes understanding and will continue to walk and get strength back.

## 2015-07-18 NOTE — Telephone Encounter (Signed)
Prior auth for Eliquis 5 mg sent to [facility]. 

## 2015-07-21 ENCOUNTER — Other Ambulatory Visit: Payer: Self-pay

## 2015-07-21 MED ORDER — SIMVASTATIN 20 MG PO TABS: 20.0000 mg | ORAL_TABLET | Freq: Every day | ORAL | Status: DC

## 2015-07-22 NOTE — Telephone Encounter (Signed)
Ok to fill 

## 2015-07-24 NOTE — Progress Notes (Signed)
Cardiology Office Note   Date:  07/25/2015   ID:  Harold Scott, DOB 29-Dec-1960, MRN [id number]   Patient Care Team: Dorena Cookey, MD as PCP - General [name], MD as Consulting Physician (Cardiology)    Chief Complaint  Patient presents with  . Hospitalization Follow-up    s/p CABG + MAZE     History of Present Illness: Harold Scott is a 54 y.o. male with a hx of symptomatic, persistent atrial fibrillation, HTN, chronic diastolic HF, OSA on CPAP, anxiety/depression. His atrial fibrillation has been refractory to medical therapy and catheter-based ablation 2. He has failed flecainide, Multaq, Tikosyn and amiodarone. He was ultimately referred to Dr. Roxy Manns for consideration of surgical treatment of atrial fibrillation. LHC demonstrated severe stenosis of the first diagonal and moderate LAD stenosis.  He was admitted 11/10-11/17 for combined CABG 2 (LIMA-D1/LAD) and Maze procedure (complete bilateral atrial lesion set using bipolar radiofrequency and cryotherapy ablation and clipping of LAA). Postop course was notable for atypical atrial flutter with controlled rate on amiodarone and beta blocker. He did convert to NSR prior to discharge.  He was maintained on Eliquis for anticoagulation.  Of note, he called in to the office post DC with complaints of remaining out of rhythm.   Returns for FU.  Overall, he has been doing well. His breathing is stable. He's been walking 1-2 miles a day without significant difficulty. He can tell when he is in atrial fibrillation. He had several days when he was in normal rhythm. The last couple days he feels he has been back in atrial fibrillation. Chest wounds are healing well. He denies fever. He has a nonproductive cough. He has a lot of sinus drainage. He denies orthopnea, PND or edema. He denies syncope. Weight is down 14 pounds.   Studies/Reports Reviewed Today:  TEE (intraoperative) 06/30/15 EF 50-55%, mild to moderate MR, no LAA  clot  Carotid US 06/28/15 Bilateral ICA 1-39%  LHC 06/20/15 LAD: prox 30%, mid 50%, D1 99%, D2 60% LCx: mid 30%, OM1 20% RCA:  Mid 30% 1. Severe stenosis moderate caliber Diagonal branch 2. Moderate stenosis mid LAD 3. Mild stenosis RCA 4. Mild stenosis Circumflex/OM 5. Elevated filling pressures Recommendations: Will review images with Dr. Roxy Manns. He is planning a MAZE procedure. The Diagonal lesion could be easily treated with PCI/stenting but timing will be based around timing of surgical procedure. Will start ASA 81 mg daily. Resume Eliquis tomorrow. D/C home today.  Echo 12/22/14 Severe focal basal hypertrophy, vigorous LVF, EF 65-70%, normal wall motion, trivial MR, mild LAE  Myoview 07/20/14 Mild basal inferolateral thinning consistent with probable soft tissue attenuation, cannot r/o subendocardial scar. No significant ischemia  LVEF 51% with inferior hypokinesis.  Overall low risk scan    Past Medical History  Diagnosis Date  . Anxiety   . Depression   . GERD (gastroesophageal reflux disease)   . Gallstones   . PONV (postoperative nausea and vomiting)   . Paroxysmal atrial fibrillation (HCC)     a.  previously intolerant of Flecainide and Multaq b. PVI by Dr [name] 09/21/14  . Obstructive sleep apnea      on CPAP  . Mitral regurgitation     mild  . Essential hypertension 07/15/2013  . Diastolic CHF, chronic ([place]) 07/18/2014  . Morbid obesity ([place]) 12/21/2014  . GERD 09/09/2008    Qualifier: Diagnosis of  By: Sherren Mocha MD, Jory Ee   . Hyperlipidemia   . Chronic diastolic (congestive) heart  failure ([place])   . Coronary artery disease 06/20/2015  . Anginal pain ([place])   . Dysrhythmia     AFIB  . History of hiatal hernia   . Arthritis   . S/P Maze operation for atrial fibrillation 06/30/2015    Complete bilateral atrial lesion set using bipolar radiofrequency and cryothermy ablation with clipping of LA appendage via conventional median sternotomy   . S/P CABG x 2 06/30/2015     LIMA to D1 and distal LAD, sequentially    Past Surgical History  Procedure Laterality Date  . Hand surgery    . Tonsillectomy    . Wisdom tooth extraction    . Cholecystectomy  08/05/2012    Procedure: LAPAROSCOPIC CHOLECYSTECTOMY WITH INTRAOPERATIVE CHOLANGIOGRAM;  Surgeon: [name], MD,FACS;  Location: [facility];  Service: General;  Laterality: N/A;  . Laparoscopic appendectomy N/A 02/13/2014    Procedure: APPENDECTOMY LAPAROSCOPIC;  Surgeon: [name], MD;  Location: [facility];  Service: General;  Laterality: N/A;  . Tee without cardioversion N/A 09/20/2014    Procedure: TRANSESOPHAGEAL ECHOCARDIOGRAM (TEE);  Surgeon: [name], MD;  Location: [facility];  Service: Cardiovascular;  Laterality: N/A;  . Ablation  09/21/14    PVI by Dr [name]  . Atrial fibrillation ablation N/A 09/21/2014    Procedure: ATRIAL FIBRILLATION ABLATION;  Surgeon: [name], MD;  Location: [facility] CATH LAB;  Service: Cardiovascular;  Laterality: N/A;  . Tee without cardioversion N/A 01/10/2015    Procedure: TRANSESOPHAGEAL ECHOCARDIOGRAM (TEE);  Surgeon: Sanda Klein, MD;  Location: [facility] ENDOSCOPY;  Service: Cardiovascular;  Laterality: N/A;  . Electrophysiologic study N/A 01/11/2015    Procedure: Atrial Fibrillation Ablation;  Surgeon: [name], MD;  Location: [facility] [facility] CV LAB;  Service: Cardiovascular;  Laterality: N/A;  . Cardiac catheterization N/A 06/20/2015    Procedure: Right/Left Heart Cath and Coronary Angiography;  Surgeon: Burnell Blanks, MD;  Location: [facility] CV LAB;  Service: Cardiovascular;  Laterality: N/A;  . Coronary artery bypass graft N/A 06/30/2015    Procedure: CORONARY ARTERY BYPASS GRAFTING (CABG)X2 SEQ LIMA-DIAG-LAD;  Surgeon: Rexene Alberts, MD;  Location: [facility];  Service: Open Heart Surgery;  Laterality: N/A;  . Tee without cardioversion N/A 06/30/2015    Procedure: TRANSESOPHAGEAL ECHOCARDIOGRAM (TEE);  Surgeon: Rexene Alberts, MD;  Location: [place];  Service:  Open Heart Surgery;  Laterality: N/A;  . Maze N/A 06/30/2015    Procedure: MAZE;  Surgeon: Rexene Alberts, MD;  Location: [facility];  Service: Open Heart Surgery;  Laterality: N/A;     Current Outpatient Prescriptions  Medication Sig Dispense Refill  . amiodarone (PACERONE) 200 MG tablet Take 1 tablet (200 mg total) by mouth 2 (two) times daily. 60 tablet 1  . apixaban (ELIQUIS) 5 MG TABS tablet Take 5 mg by mouth 2 (two) times daily.    Marland Kitchen aspirin EC 81 MG EC tablet Take 1 tablet (81 mg total) by mouth daily.    . cholestyramine (QUESTRAN) 4 G packet Take 4 g by mouth daily.     . citalopram (CELEXA) 10 MG tablet TAKE 1 TABLET BY MOUTH ONCE DAILY (Patient taking differently: TAKE 10 MG BY MOUTH ONCE DAILY) 90 tablet 1  . clorazepate (TRANXENE) 7.5 MG tablet TAKE 1 TABLET BY MOUTH AT BEDTIME (Patient taking differently: TAKE 7.5 MG BY MOUTH AT BEDTIME) 30 tablet 5  . esomeprazole (NEXIUM) 40 MG capsule Take 40 mg by mouth daily at 12 noon.    . furosemide (LASIX) 40  MG tablet Take 1 tablet (40 mg total) by mouth daily. 30 tablet 6  . guaiFENesin 200 MG tablet Take 200 mg by mouth 2 (two) times daily as needed (for cold).    Marland Kitchen lisinopril (PRINIVIL,ZESTRIL) 5 MG tablet Take 1 tablet (5 mg total) by mouth daily. 30 tablet 3  . metoprolol succinate (TOPROL-XL) 25 MG 24 hr tablet Take 1 tablet (25 mg total) by mouth 2 (two) times daily. 60 tablet 1  . potassium chloride SA (K-DUR,KLOR-CON) 20 MEQ tablet Take 1 tablet (20 mEq total) by mouth daily. 30 tablet 6  . simvastatin (ZOCOR) 20 MG tablet Take 1 tablet (20 mg total) by mouth daily at 6 PM. 30 tablet 11   No current facility-administered medications for this visit.    Allergies:   Flecainide; Multaq; and Penicillins    Social History:   Social History   Social History  . Marital Status: Married    Spouse Name: N/A  . Number of Children: 1  . Years of Education: N/A   Occupational History  . Courier    Social History Main Topics   . Smoking status: Former Smoker -- 0.50 packs/day for 30 years    Types: Cigarettes    Quit date: 07/23/2006  . Smokeless tobacco: Never Used  . Alcohol Use: 4.2 - 8.4 oz/week    7-14 Standard drinks or equivalent per week     Comment: social  . Drug Use: No  . Sexual Activity: Not Asked   Other Topics Concern  . None   Social History Narrative   Pt works as a Secondary school teacher     Family History:   Family History  Problem Relation Age of Onset  . Adopted: Yes  . Other      ADOPTED      ROS:   Please see the history of present illness.   Review of Systems  Cardiovascular: Positive for dyspnea on exertion and irregular heartbeat.  Respiratory: Positive for snoring.   All other systems reviewed and are negative.     PHYSICAL EXAM: VS:  BP 118/80 mmHg  Pulse 81  Ht 5\' 7"  (1.702 m)  Wt 273 lb 12.8 oz (124.195 kg)  BMI 42.87 kg/m2    Wt Readings from Last 3 Encounters:  07/25/15 273 lb 12.8 oz (124.195 kg)  07/07/15 292 lb 15.9 oz (132.9 kg)  06/28/15 292 lb (132.45 kg)     GEN: Well nourished, well developed, in no acute distress HEENT: normal Neck: no JVD,  no masses Cardiac:  Normal S1/S2, irregularly irregular rhythm; no murmur ,  no rubs or gallops, no edema   Chest: Median sternotomy well healed without erythema or discharge; chest tube wounds are somewhat slow to heal but without erythema or discharge Respiratory:  clear to auscultation bilaterally, no wheezing, rhonchi or rales. GI: soft, nontender, nondistended, + BS MS: no deformity or atrophy Skin: warm and dry  Neuro:  CNs II-XII intact, Strength and sensation are intact Psych: Normal affect   EKG:  EKG is ordered today.  It demonstrates:   Atrial flutter with variable AV block, HR 81, LAD, nonspecific ST-T wave changes, QTc 501 ms   Recent Labs: 12/21/2014: B Natriuretic Peptide 318.2* 04/11/2015: TSH 6.568* 06/28/2015: ALT 54 07/01/2015: Magnesium 2.2 07/05/2015: Hemoglobin 8.9*; Platelets  250 07/07/2015: BUN 16; Creatinine, Ser 0.95; Potassium 4.1; Sodium 131*    Lipid Panel    Component Value Date/Time   CHOL 228* 07/19/2014 0425   TRIG 338* 07/19/2014 0425  HDL 30* 07/19/2014 0425   CHOLHDL 7.6 07/19/2014 0425   VLDL 68* 07/19/2014 0425   LDLCALC 130* 07/19/2014 0425      ASSESSMENT AND PLAN:  1. CAD: Status post CABG 2. He is progressing well. Continue aspirin, beta blocker, statin. I have encouraged him to look into cardiac rehabilitation after seeing Dr. Roxy Manns.  2. Persistent atrial fibrillation:  Status post Maze procedure. Currently he is in atrial flutter.  I reviewed his case with [name]. He suggested attempting cardioversion if he remains in atrial fibrillation/flutter the majority of the time in about 3 weeks.  3. HTN:  Controlled.   4. Hyperlipidemia:  Continue statin.   5. Chronic Diastolic CHF:  Volume stable. Check FU BMET today.   6. OSA: Continue CPAP.  7. Mitral Regurgitation:  Recent TEE at time of CABG/Maze with mod MR. Will follow clinically with repeat Echo in ~ 1 year.    Medication Changes: Current medicines are reviewed at length with the patient today.  Concerns regarding medicines are as outlined above.  The following changes have been made:   Discontinued Medications   CHOLESTYRAMINE (QUESTRAN) 4 G PACKET    TAKE 2 PACKETS DAILY AS DIRECTED *PT NEEDS OFFICE VISIT FOR MORE REFILLS**   OXYCODONE (OXY IR/ROXICODONE) 5 MG IMMEDIATE RELEASE TABLET    Take 1-2 tablets (5-10 mg total) by mouth every 3 (three) hours as needed for severe pain.   TRAMADOL (ULTRAM) 50 MG TABLET    Take 1-2 tablets (50-100 mg total) by mouth every 4 (four) hours as needed for moderate pain.   Modified Medications   No medications on file   New Prescriptions   No medications on file   Labs/ tests ordered today include:   Orders Placed This Encounter  Procedures  . Basic Metabolic Panel (BMET)  . EKG 12-Lead     Disposition:    FU with me in 3  weeks and Dr. [name] in 6-8 weeks.     Signed, Versie Starks, MHS 07/25/2015 9:41 AM    [facility] [place], [place], [place]  [address] Phone: [phone]; Fax: [phone]

## 2015-07-25 ENCOUNTER — Telehealth: Payer: Self-pay | Admitting: *Deleted

## 2015-07-25 ENCOUNTER — Encounter: Payer: Self-pay | Admitting: Physician Assistant

## 2015-07-25 ENCOUNTER — Ambulatory Visit (INDEPENDENT_AMBULATORY_CARE_PROVIDER_SITE_OTHER): Payer: PRIVATE HEALTH INSURANCE | Admitting: Physician Assistant

## 2015-07-25 VITALS — BP 118/80 | HR 81 | Ht 67.0 in | Wt 273.8 lb

## 2015-07-25 DIAGNOSIS — I10 Essential (primary) hypertension: Secondary | ICD-10-CM | POA: Diagnosis not present

## 2015-07-25 DIAGNOSIS — I4819 Other persistent atrial fibrillation: Secondary | ICD-10-CM

## 2015-07-25 DIAGNOSIS — E785 Hyperlipidemia, unspecified: Secondary | ICD-10-CM | POA: Diagnosis not present

## 2015-07-25 DIAGNOSIS — I340 Nonrheumatic mitral (valve) insufficiency: Secondary | ICD-10-CM

## 2015-07-25 DIAGNOSIS — I2510 Atherosclerotic heart disease of native coronary artery without angina pectoris: Secondary | ICD-10-CM | POA: Diagnosis not present

## 2015-07-25 DIAGNOSIS — G4733 Obstructive sleep apnea (adult) (pediatric): Secondary | ICD-10-CM

## 2015-07-25 DIAGNOSIS — I5032 Chronic diastolic (congestive) heart failure: Secondary | ICD-10-CM

## 2015-07-25 DIAGNOSIS — I481 Persistent atrial fibrillation: Secondary | ICD-10-CM

## 2015-07-25 LAB — BASIC METABOLIC PANEL
BUN: 10 mg/dL (ref 7–25)
CHLORIDE: 95 mmol/L — AB (ref 98–110)
CO2: 27 mmol/L (ref 20–31)
Calcium: 9.7 mg/dL (ref 8.6–10.3)
Creat: 1.01 mg/dL (ref 0.70–1.33)
Glucose, Bld: 115 mg/dL — ABNORMAL HIGH (ref 65–99)
POTASSIUM: 4.4 mmol/L (ref 3.5–5.3)
SODIUM: 136 mmol/L (ref 135–146)

## 2015-07-25 NOTE — Patient Instructions (Signed)
Medication Instructions:  Your physician recommends that you continue on your current medications as directed. Please refer to the Current Medication list given to you today.   Labwork: TODAY BMET  Testing/Procedures: NONE  Follow-Up: 1. 08/17/15 @ 3:40 WITH SCOTT WEAVER, PAC   2. 09/07/15 @ 4:30 WITH DR. [name]  Any Other Special Instructions Will Be Listed Below (If Applicable).   If you need a refill on your cardiac medications before your next appointment, please call your pharmacy.

## 2015-07-25 NOTE — Telephone Encounter (Signed)
Pt notified of lab results by phone with verbal understanding.  

## 2015-07-28 ENCOUNTER — Other Ambulatory Visit: Payer: Self-pay | Admitting: Family Medicine

## 2015-07-29 ENCOUNTER — Other Ambulatory Visit: Payer: Self-pay | Admitting: Thoracic Surgery (Cardiothoracic Vascular Surgery)

## 2015-07-29 DIAGNOSIS — Z951 Presence of aortocoronary bypass graft: Secondary | ICD-10-CM

## 2015-08-01 ENCOUNTER — Ambulatory Visit
Admission: RE | Admit: 2015-08-01 | Discharge: 2015-08-01 | Disposition: A | Discharge status: Home / Self Care | Payer: Managed Care, Other (non HMO) | Source: Ambulatory Visit | Attending: Thoracic Surgery (Cardiothoracic Vascular Surgery) | Admitting: Thoracic Surgery (Cardiothoracic Vascular Surgery)

## 2015-08-01 ENCOUNTER — Encounter: Payer: Self-pay | Admitting: Thoracic Surgery (Cardiothoracic Vascular Surgery)

## 2015-08-01 ENCOUNTER — Other Ambulatory Visit: Payer: Self-pay | Admitting: Thoracic Surgery (Cardiothoracic Vascular Surgery)

## 2015-08-01 ENCOUNTER — Ambulatory Visit (INDEPENDENT_AMBULATORY_CARE_PROVIDER_SITE_OTHER): Payer: Self-pay | Admitting: Thoracic Surgery (Cardiothoracic Vascular Surgery)

## 2015-08-01 VITALS — BP 125/76 | HR 61 | Temp 97.9°F | Resp 16 | Ht 68.0 in | Wt 273.0 lb

## 2015-08-01 DIAGNOSIS — Z8679 Personal history of other diseases of the circulatory system: Secondary | ICD-10-CM

## 2015-08-01 DIAGNOSIS — Z9889 Other specified postprocedural states: Secondary | ICD-10-CM

## 2015-08-01 DIAGNOSIS — I2510 Atherosclerotic heart disease of native coronary artery without angina pectoris: Secondary | ICD-10-CM

## 2015-08-01 DIAGNOSIS — Z951 Presence of aortocoronary bypass graft: Secondary | ICD-10-CM

## 2015-08-01 DIAGNOSIS — I480 Paroxysmal atrial fibrillation: Secondary | ICD-10-CM

## 2015-08-01 MED ORDER — CEPHALEXIN 500 MG PO CAPS: 500.0000 mg | ORAL_CAPSULE | Freq: Three times a day (TID) | ORAL | Status: DC

## 2015-08-01 NOTE — Progress Notes (Signed)
[address]       [place] [address]             [phone]     CARDIOTHORACIC SURGERY OFFICE NOTE  Referring Provider is [name], MD PCP is [name], MD   HPI:  Patient returns to the office today for follow-up status post coronary artery bypass grafting 2 and maze procedure on 06/30/2015 for severe single-vessel coronary artery disease with recurrent paroxysmal atrial fibrillation that was refractory to medical therapy and transcatheter ablation on 2 previous occasions. The patient's early postoperative recovery was notable for the development of atypical atrial flutter.  He otherwise did well and was discharged from the hospital on the seventh postoperative day. Since hospital discharge he has been seen in follow-up by Richardson Dopp and [name] at [facility] on 07/24/2015. At that time he was in atrial flutter with controlled rate. He returns to our office today. He states that he has been feeling better and that overall he feels much better than he did prior to surgery. He states that he still goes in and out of rhythm and he can tell the difference, however, when he is out of rhythm and he feels much better than he did any known longer has episodes of rapid heartbeat. Overall his breathing is much improved and he has continued to lose weight. He has developed some redness and a scant amount of drainage from the inferior aspect of his sternal incision over the last several days. He has not had associated fevers or chills. Appetite is good and he otherwise feels quite well.   Current Outpatient Prescriptions  Medication Sig Dispense Refill  . amiodarone (PACERONE) 200 MG tablet Take 1 tablet (200 mg total) by mouth 2 (two) times daily. 60 tablet 1  . apixaban (ELIQUIS) 5 MG TABS tablet Take 5 mg by mouth 2 (two) times daily.    Marland Kitchen aspirin EC 81 MG EC tablet Take 1 tablet (81 mg total) by mouth daily.    . cholestyramine (QUESTRAN) 4 G packet  Take 4 g by mouth daily.     . citalopram (CELEXA) 10 MG tablet TAKE 1 TABLET BY MOUTH ONCE DAILY (Patient taking differently: TAKE 10 MG BY MOUTH ONCE DAILY) 90 tablet 1  . clorazepate (TRANXENE) 7.5 MG tablet TAKE 1 TABLET AT BEDTIME 15 tablet 0  . furosemide (LASIX) 40 MG tablet Take 1 tablet (40 mg total) by mouth daily. 30 tablet 6  . guaiFENesin 200 MG tablet Take 200 mg by mouth 2 (two) times daily as needed (for cold).    Marland Kitchen lisinopril (PRINIVIL,ZESTRIL) 5 MG tablet Take 1 tablet (5 mg total) by mouth daily. 30 tablet 3  . metoprolol succinate (TOPROL-XL) 25 MG 24 hr tablet Take 1 tablet (25 mg total) by mouth 2 (two) times daily. 60 tablet 1  . potassium chloride SA (K-DUR,KLOR-CON) 20 MEQ tablet Take 1 tablet (20 mEq total) by mouth daily. 30 tablet 6  . simvastatin (ZOCOR) 20 MG tablet Take 1 tablet (20 mg total) by mouth daily at 6 PM. 30 tablet 11  . cephALEXin (KEFLEX) 500 MG capsule Take 1 capsule (500 mg total) by mouth 3 (three) times daily. 21 capsule 0  . esomeprazole (NEXIUM) 40 MG capsule Take 40 mg by mouth daily at 12 noon.     No current facility-administered medications for this visit.      Physical Exam:   BP 125/76 mmHg  Pulse 61  Temp(Src) 97.9 F (36.6 C)  Resp 16  Ht 5\' 8"  (1.727 m)  Wt 273 lb (123.832 kg)  BMI 41.52 kg/m2  SpO2 97%  General:  Obese but well appearing  Chest:   Clear to auscultation  CV:   Regular rate and rhythm without murmur  Incisions:  Clean dry and healing nicely with exception of small amount of purulent drainage with surrounding cellulitis from the inferior aspect of the sternotomy incision. This is consistent with likely stitch abscess. The wound is cultured then cleansed, and a knot of suture material was excised sharply in the office. The wound is packed.  Abdomen:  Soft and nontender  Extremities:  Warm and well-perfused  Diagnostic Tests:  2 channel telemetry rhythm strip demonstrates normal sinus rhythm    CHEST 2  VIEW  COMPARISON: Chest x-ray 07/03/2015.  FINDINGS: Lung volumes are low. No consolidative airspace disease. No pleural effusions. No pneumothorax. No pulmonary nodule or mass noted. Pulmonary vasculature and the cardiomediastinal silhouette are within normal limits. Status post median sternotomy. Left atrial appendage ligation clip.  IMPRESSION: 1. Low lung volumes without radiographic evidence of acute cardiopulmonary disease.   Electronically Signed  By: Vinnie Langton M.D.  On: 08/01/2015 14:49   Impression:  Patient is doing reasonably well proximally 4 weeks status post coronary artery bypass grafting 2 and maze procedure. He has developed recurrent atypical atrial flutter postoperatively although he is currently maintaining sinus rhythm today. He remains chronically anticoagulated using Eliquis.  Overall he feels much better. Although he can tell when he goes in and out of rhythm, he states that when he is out of rhythm he feels much better than he did prior to surgery and for the most part he feels as though he has been staying in rhythm over the past 10 days.  He has developed localized cellulitis at the inferior aspect of the sternotomy incision consistent with a stitch abscess. This has been treated by removal of the suture material in the office today.    Plan:  I have given the patient a prescription for Keflex 500 mg by mouth 3 times daily for 7 days. We have not recommended any other changes to his current medications at this time. The patient has been instructed regarding local wound care. I think he may resume driving an automobile. I've encouraged him to go ahead and get started in the outpatient cardiac rehabilitation program.  The patient will return in 3 weeks for a wound check.  During the interim period of time he will call and return sooner should he develop increased redness, drainage, or fevers.   Valentina Gu. Roxy Manns, MD 08/01/2015 3:32  PM

## 2015-08-01 NOTE — Patient Instructions (Addendum)
Continue to avoid any heavy lifting or strenuous use of your arms or shoulders for at least a total of three months from the time of surgery.  After three months you may gradually increase how much you lift or otherwise use your arms or chest as tolerated, with limits based upon whether or not activities lead to the return of significant discomfort.  The patient is encouraged to enroll and participate in the outpatient cardiac rehab program beginning as soon as practical.  You may return to driving an automobile as long as you are no longer requiring oral narcotic pain relievers during the daytime.  It would be wise to start driving only short distances during the daylight and gradually increase from there as you feel comfortable.  Clean your incision every day using hydrogen peroxide and keep a sterile dressing in place.  Replace a small "wick" dressing as long as a small opening persists as you were instructed in the office today  Call if you develop increased drainage, redness or fevers

## 2015-08-02 ENCOUNTER — Other Ambulatory Visit: Payer: Self-pay | Admitting: Family Medicine

## 2015-08-05 LAB — CULTURE, ROUTINE-ABSCESS
GRAM STAIN: NONE SEEN
Gram Stain: NONE SEEN

## 2015-08-08 ENCOUNTER — Encounter: Payer: Self-pay | Admitting: Internal Medicine

## 2015-08-09 ENCOUNTER — Other Ambulatory Visit: Payer: Self-pay | Admitting: Physician Assistant

## 2015-08-09 ENCOUNTER — Other Ambulatory Visit: Payer: Self-pay | Admitting: Nurse Practitioner

## 2015-08-12 ENCOUNTER — Telehealth: Payer: Self-pay | Admitting: Family Medicine

## 2015-08-12 MED ORDER — CLORAZEPATE DIPOTASSIUM 7.5 MG PO TABS: 7.5000 mg | ORAL_TABLET | Freq: Every day | ORAL | Status: DC

## 2015-08-12 NOTE — Telephone Encounter (Signed)
Rx sent 

## 2015-08-12 NOTE — Telephone Encounter (Signed)
Pt has been sch for cpx in may 2017

## 2015-08-12 NOTE — Telephone Encounter (Signed)
Pt called saying he needs a refill for Clorazepate. In his last refill he only received 15 pills which is only half per Marjory Lies. He wants to make sure that doesn't happen again. He has 4 pills left. Please give him a call if needed. [facility] uses the [facility] on [address].  Pt's ph# [phone] Thank you.

## 2015-08-16 NOTE — Progress Notes (Signed)
Cardiology Office Note    Date:  08/17/2015   ID:  Harold Scott, DOB 1961/03/01, MRN [id number]  PCP:  [name], MD  Cardiologist:  Dr. [name]   Electrophysiologist:  Dr. [name]   Chief Complaint  Patient presents with  . Follow-up  . Atrial Fibrillation    History of Present Illness:  Harold Scott is a 54 y.o. male with a hx of symptomatic, persistent atrial fibrillation, HTN, chronic diastolic HF, OSA on CPAP, anxiety/depression. His atrial fibrillation has been refractory to medical therapy and catheter-based ablation 2. He has failed flecainide, Multaq, Tikosyn and amiodarone. He was ultimately referred to Dr. Roxy Manns for consideration of surgical treatment of atrial fibrillation. LHC demonstrated severe stenosis of the first diagonal and moderate LAD stenosis.  He underwent combined CABG 2 (LIMA-D1/LAD) and Maze procedure (complete bilateral atrial lesion set using bipolar radiofrequency and cryotherapy ablation and clipping of LAA).  I saw him 3 weeks ago for post-hospital FU.  He was back in AFlutter.  I reviewed with Dr. [name] and we brought him back today for FU with an eye towards DCCV if he remained in AFlutter.  Since last seen, he has had less and less episodes of atrial fibrillation. He is in sinus rhythm today. He denies significant chest pain. Denies dyspnea, orthopnea, PND or edema. He's been walking without significant symptoms.   Past Medical History  Diagnosis Date  . Anxiety   . Depression   . GERD (gastroesophageal reflux disease)   . Gallstones   . PONV (postoperative nausea and vomiting)   . Paroxysmal atrial fibrillation (HCC)     a.  previously intolerant of Flecainide and Multaq b. PVI by Dr [name] 09/21/14  . Obstructive sleep apnea      on CPAP  . Mitral regurgitation     mild  . Essential hypertension 07/15/2013  . Diastolic CHF, chronic ([place]) 07/18/2014  . Morbid obesity ([place]) 12/21/2014  . GERD 09/09/2008   Qualifier: Diagnosis of  By: Sherren Mocha MD, Jory Ee   . Hyperlipidemia   . Chronic diastolic (congestive) heart failure ([place])   . Coronary artery disease 06/20/2015  . Anginal pain ([place])   . Dysrhythmia     AFIB  . History of hiatal hernia   . Arthritis   . S/P Maze operation for atrial fibrillation 06/30/2015    Complete bilateral atrial lesion set using bipolar radiofrequency and cryothermy ablation with clipping of LA appendage via conventional median sternotomy   . S/P CABG x 2 06/30/2015    LIMA to D1 and distal LAD, sequentially    Past Surgical History  Procedure Laterality Date  . Hand surgery    . Tonsillectomy    . Wisdom tooth extraction    . Cholecystectomy  08/05/2012    Procedure: LAPAROSCOPIC CHOLECYSTECTOMY WITH INTRAOPERATIVE CHOLANGIOGRAM;  Surgeon: [name], MD,FACS;  Location: [facility];  Service: General;  Laterality: N/A;  . Laparoscopic appendectomy N/A 02/13/2014    Procedure: APPENDECTOMY LAPAROSCOPIC;  Surgeon: [name], MD;  Location: [facility];  Service: General;  Laterality: N/A;  . Tee without cardioversion N/A 09/20/2014    Procedure: TRANSESOPHAGEAL ECHOCARDIOGRAM (TEE);  Surgeon: [name], MD;  Location: [facility];  Service: Cardiovascular;  Laterality: N/A;  . Ablation  09/21/14    PVI by Dr [name]  . Atrial fibrillation ablation N/A 09/21/2014    Procedure: ATRIAL FIBRILLATION ABLATION;  Surgeon: [name], MD;  Location: [facility] CATH LAB;  Service: Cardiovascular;  Laterality: N/A;  . Tee without cardioversion N/A 01/10/2015    Procedure: TRANSESOPHAGEAL ECHOCARDIOGRAM (TEE);  Surgeon: Sanda Klein, MD;  Location: [facility] ENDOSCOPY;  Service: Cardiovascular;  Laterality: N/A;  . Electrophysiologic study N/A 01/11/2015    Procedure: Atrial Fibrillation Ablation;  Surgeon: [name], MD;  Location: [facility] CV LAB;  Service: Cardiovascular;  Laterality: N/A;  . Cardiac catheterization N/A 06/20/2015    Procedure: Right/Left Heart Cath and  Coronary Angiography;  Surgeon: Burnell Blanks, MD;  Location: [facility] CV LAB;  Service: Cardiovascular;  Laterality: N/A;  . Coronary artery bypass graft N/A 06/30/2015    Procedure: CORONARY ARTERY BYPASS GRAFTING (CABG)X2 SEQ LIMA-DIAG-LAD;  Surgeon: Rexene Alberts, MD;  Location: [facility];  Service: Open Heart Surgery;  Laterality: N/A;  . Tee without cardioversion N/A 06/30/2015    Procedure: TRANSESOPHAGEAL ECHOCARDIOGRAM (TEE);  Surgeon: Rexene Alberts, MD;  Location: [facility];  Service: Open Heart Surgery;  Laterality: N/A;  . Maze N/A 06/30/2015    Procedure: MAZE;  Surgeon: Rexene Alberts, MD;  Location: [facility];  Service: Open Heart Surgery;  Laterality: N/A;    Current Outpatient Prescriptions  Medication Sig Dispense Refill  . amiodarone (PACERONE) 200 MG tablet Take 1 tablet (200 mg total) by mouth 2 (two) times daily. 60 tablet 1  . apixaban (ELIQUIS) 5 MG TABS tablet Take 5 mg by mouth 2 (two) times daily.    Marland Kitchen aspirin EC 81 MG EC tablet Take 1 tablet (81 mg total) by mouth daily.    . cholestyramine (QUESTRAN) 4 G packet Take 4 g by mouth daily.     . citalopram (CELEXA) 10 MG tablet TAKE 1 TABLET BY MOUTH ONCE DAILY (Patient taking differently: TAKE 10 MG BY MOUTH ONCE DAILY) 90 tablet 1  . clorazepate (TRANXENE) 7.5 MG tablet Take 1 tablet (7.5 mg total) by mouth at bedtime. 90 tablet 1  . esomeprazole (NEXIUM) 40 MG capsule Take 40 mg by mouth daily at 12 noon.    . furosemide (LASIX) 40 MG tablet Take 1 tablet (40 mg total) by mouth daily. 30 tablet 6  . guaiFENesin 200 MG tablet Take 200 mg by mouth 2 (two) times daily as needed (for cold).    Marland Kitchen lisinopril (PRINIVIL,ZESTRIL) 5 MG tablet Take 1 tablet (5 mg total) by mouth daily. 30 tablet 3  . metoprolol succinate (TOPROL-XL) 25 MG 24 hr tablet Take 1 tablet (25 mg total) by mouth 2 (two) times daily. 60 tablet 1  . potassium chloride SA (K-DUR,KLOR-CON) 20 MEQ tablet Take 1 tablet (20 mEq total) by mouth daily. 30  tablet 6  . simvastatin (ZOCOR) 20 MG tablet Take 1 tablet (20 mg total) by mouth daily at 6 PM. 30 tablet 11   No current facility-administered medications for this visit.    Allergies:   Flecainide; Multaq; and Penicillins   Social History   Social History  . Marital Status: Married    Spouse Name: N/A  . Number of Children: 1  . Years of Education: N/A   Occupational History  . Courier    Social History Main Topics  . Smoking status: Former Smoker -- 0.50 packs/day for 30 years    Types: Cigarettes    Quit date: 07/23/2006  . Smokeless tobacco: Never Used  . Alcohol Use: 4.2 - 8.4 oz/week    7-14 Standard drinks or equivalent per week     Comment: social  . Drug Use: No  .  Sexual Activity: Not Asked   Other Topics Concern  . None   Social History Narrative   Pt works as a Secondary school teacher     Family History:  The patient's family history is not on file. He was adopted.   ROS:   Please see the history of present illness.    Review of Systems  HENT: Positive for congestion.   All other systems reviewed and are negative.   PHYSICAL EXAM:   VS:  BP 130/70 mmHg  Pulse 51  Ht 5\' 8"  (1.727 m)  Wt 271 lb 12.8 oz (123.288 kg)  BMI 41.34 kg/m2   GEN: Well nourished, well developed, in no acute distress HEENT: normal Cardiac: Normal S1/S2, RRR; no murmurs, rubs, or gallops, no edema;     Respiratory:  clear to auscultation bilaterally; no wheezing, rhonchi or rales GI: soft, nontender  MS: no deformity or atrophy Skin: warm and dry, no rash Neuro:  no focal deficits  Psych: Alert and oriented x 3, normal affect  Wt Readings from Last 3 Encounters:  08/17/15 271 lb 12.8 oz (123.288 kg)  08/01/15 273 lb (123.832 kg)  07/25/15 273 lb 12.8 oz (124.195 kg)      Studies/Labs Reviewed:   EKG:  EKG is ordered today.  The ekg ordered today demonstrates sinus brady, HR 51, normal axis, QTc 479 ms  Recent Labs: 12/21/2014: B Natriuretic Peptide 318.2* 04/11/2015: TSH  6.568* 06/28/2015: ALT 54 07/01/2015: Magnesium 2.2 07/05/2015: Hemoglobin 8.9*; Platelets 250 07/25/2015: BUN 10; Creat 1.01; Potassium 4.4; Sodium 136   Recent Lipid Panel    Component Value Date/Time   CHOL 228* 07/19/2014 0425   TRIG 338* 07/19/2014 0425   HDL 30* 07/19/2014 0425   CHOLHDL 7.6 07/19/2014 0425   VLDL 68* 07/19/2014 0425   LDLCALC 130* 07/19/2014 0425    Additional studies/ records that were reviewed today include:   TEE (intraoperative) 06/30/15 EF 50-55%, mild to moderate MR, no LAA clot  Carotid US 06/28/15 Bilateral ICA 1-39%  LHC 06/20/15 LAD: prox 30%, mid 50%, D1 99%, D2 60% LCx: mid 30%, OM1 20% RCA: Mid 30% 1. Severe stenosis moderate caliber Diagonal branch 2. Moderate stenosis mid LAD 3. Mild stenosis RCA 4. Mild stenosis Circumflex/OM 5. Elevated filling pressures Recommendations: Will review images with Dr. Roxy Manns. He is planning a MAZE procedure. The Diagonal lesion could be easily treated with PCI/stenting but timing will be based around timing of surgical procedure. Will start ASA 81 mg daily. Resume Eliquis tomorrow. D/C home today.  Echo 12/22/14 Severe focal basal hypertrophy, vigorous LVF, EF 65-70%, normal wall motion, trivial MR, mild LAE  Myoview 07/20/14 Mild basal inferolateral thinning consistent with probable soft tissue attenuation, cannot r/o subendocardial scar. No significant ischemia LVEF 51% with inferior hypokinesis. Overall low risk scan     ASSESSMENT:    1. Persistent atrial fibrillation ([place])   2. Coronary artery disease involving native coronary artery of native heart without angina pectoris     PLAN:  In order of problems listed above:  1. Persistent AFib - Status post Maze procedure. He is now in sinus rhythm.  He will likely be weaned off of Amiodarone in the next couple of months.  I suppose he can eventually come off of anticoagulation.  This will be left up to Dr. [name].   2. CAD - Status post  CABG 2.  Continue aspirin, beta blocker, statin.      Medication Adjustments/Labs and Tests Ordered: Current medicines are reviewed  at length with the patient today.  Concerns regarding medicines are outlined above.  Medication changes, Labs and Tests ordered today are listed below. Patient Instructions  Medication Instructions:  No changes.  Continue your current medications.  Labwork: None today.   Testing/Procedures: None   Follow-Up: Dr. [name] as planned in January 2017  Any Other Special Instructions Will Be Listed Below (If Applicable).      Signed, Richardson Dopp, PA-C  08/17/2015 5:07 PM    [facility] [address], [place], [place]  [address] Phone: [phone]; Fax: [phone]

## 2015-08-17 ENCOUNTER — Ambulatory Visit (INDEPENDENT_AMBULATORY_CARE_PROVIDER_SITE_OTHER): Payer: Managed Care, Other (non HMO) | Admitting: Physician Assistant

## 2015-08-17 ENCOUNTER — Encounter: Payer: Self-pay | Admitting: Physician Assistant

## 2015-08-17 VITALS — BP 130/70 | HR 51 | Ht 68.0 in | Wt 271.8 lb

## 2015-08-17 DIAGNOSIS — I481 Persistent atrial fibrillation: Secondary | ICD-10-CM | POA: Diagnosis not present

## 2015-08-17 DIAGNOSIS — I4819 Other persistent atrial fibrillation: Secondary | ICD-10-CM

## 2015-08-17 DIAGNOSIS — I2510 Atherosclerotic heart disease of native coronary artery without angina pectoris: Secondary | ICD-10-CM | POA: Diagnosis not present

## 2015-08-17 NOTE — Patient Instructions (Signed)
Medication Instructions:  No changes.  Continue your current medications.  Labwork: None today.   Testing/Procedures: None   Follow-Up: Dr. [name] as planned in January 2017  Any Other Special Instructions Will Be Listed Below (If Applicable).

## 2015-08-29 ENCOUNTER — Ambulatory Visit (INDEPENDENT_AMBULATORY_CARE_PROVIDER_SITE_OTHER): Payer: Self-pay | Admitting: Thoracic Surgery (Cardiothoracic Vascular Surgery)

## 2015-08-29 ENCOUNTER — Encounter: Payer: Self-pay | Admitting: Thoracic Surgery (Cardiothoracic Vascular Surgery)

## 2015-08-29 VITALS — BP 141/76 | HR 50 | Resp 16 | Ht 68.0 in | Wt 271.0 lb

## 2015-08-29 DIAGNOSIS — I25110 Atherosclerotic heart disease of native coronary artery with unstable angina pectoris: Secondary | ICD-10-CM

## 2015-08-29 DIAGNOSIS — Z8679 Personal history of other diseases of the circulatory system: Secondary | ICD-10-CM

## 2015-08-29 DIAGNOSIS — Z951 Presence of aortocoronary bypass graft: Secondary | ICD-10-CM

## 2015-08-29 DIAGNOSIS — Z9889 Other specified postprocedural states: Secondary | ICD-10-CM

## 2015-08-29 MED ORDER — AMIODARONE HCL 200 MG PO TABS: 200.0000 mg | ORAL_TABLET | Freq: Every day | ORAL | Status: DC

## 2015-08-29 NOTE — Progress Notes (Signed)
[address]       [place] [address]             [phone]     CARDIOTHORACIC SURGERY OFFICE NOTE  Referring Provider is [name], MD PCP is [name], MD   HPI:  Patient returns to the office today for follow-up status post coronary artery bypass grafting 2 and maze procedure on 06/30/2015 for severe single-vessel coronary artery disease with recurrent paroxysmal atrial fibrillation that was refractory to medical therapy and transcatheter ablation 2. The patient's early postoperative recovery was notable for the development of atypical atrial flutter. He otherwise did well and was discharged from the hospital on the seventh postoperative day. Since hospital discharge she has been seen in follow-up on several occasions both here and at [facility].  He was last seen here in our office on 08/01/2015.  He developed a minor stitch abscess at the inferior aspect of his sternal incision. This has resolved. He was seen most recently by Richardson Dopp at [facility] on 08/17/2015. He returns to our office today reporting that he feels remarkably well. He states that he has been maintaining sinus rhythm for more than 2 weeks without any further episodes of palpitations or other symptoms to suggest a recurrence of atrial flutter since then. He is delighted with his progress. He is exercising regularly although he admits that he has not enrolled in the cardiac rehabilitation program.  However, he walks every day on a treadmill and has been gradually increasing both the speed in the incline. He states that he feels quite good. He no longer has any significant soreness in his chest. He is delighted with his progress.   Current Outpatient Prescriptions  Medication Sig Dispense Refill  . amiodarone (PACERONE) 200 MG tablet Take 1 tablet (200 mg total) by mouth 2 (two) times daily. 60 tablet 1  . apixaban (ELIQUIS) 5 MG TABS tablet Take 5 mg by mouth 2 (two) times  daily.    Marland Kitchen aspirin EC 81 MG EC tablet Take 1 tablet (81 mg total) by mouth daily.    . cholestyramine (QUESTRAN) 4 G packet Take 4 g by mouth daily.     . citalopram (CELEXA) 10 MG tablet TAKE 1 TABLET BY MOUTH ONCE DAILY (Patient taking differently: TAKE 10 MG BY MOUTH ONCE DAILY) 90 tablet 1  . clorazepate (TRANXENE) 7.5 MG tablet Take 1 tablet (7.5 mg total) by mouth at bedtime. 90 tablet 1  . esomeprazole (NEXIUM) 40 MG capsule Take 40 mg by mouth daily at 12 noon.    . furosemide (LASIX) 40 MG tablet Take 1 tablet (40 mg total) by mouth daily. 30 tablet 6  . guaiFENesin 200 MG tablet Take 200 mg by mouth 2 (two) times daily as needed (for cold).    Marland Kitchen lisinopril (PRINIVIL,ZESTRIL) 5 MG tablet Take 1 tablet (5 mg total) by mouth daily. 30 tablet 3  . metoprolol succinate (TOPROL-XL) 25 MG 24 hr tablet Take 1 tablet (25 mg total) by mouth 2 (two) times daily. 60 tablet 1  . potassium chloride SA (K-DUR,KLOR-CON) 20 MEQ tablet Take 1 tablet (20 mEq total) by mouth daily. 30 tablet 6  . simvastatin (ZOCOR) 20 MG tablet Take 1 tablet (20 mg total) by mouth daily at 6 PM. 30 tablet 11   No current facility-administered medications for this visit.      Physical Exam:   BP 141/76 mmHg  Pulse 50  Resp 16  Ht 5\' 8"  (1.727 m)  Wt 271 lb (122.925 kg)  BMI 41.22 kg/m2  SpO2 96%  General:  Well-appearing  Chest:   Clear to auscultation  CV:   Regular rate and rhythm  Incisions:  Healing nicely, sternum is stable  Abdomen:  Soft and nontender  Extremities:  Warm and well-perfused  Diagnostic Tests:  2 channel telemetry rhythm strip demonstrates normal sinus rhythm   Impression:  Patient is doing very well approximately 2 months status post coronary artery bypass grafting 2 and maze procedure. He has been maintaining sinus rhythm for the past few weeks and he feels quite good.  Plan:  I have instructed the patient to decrease his dose of amiodarone to 200 mg daily. I would probably  recommend continuing low-dose amiodarone for at least another 6-8 weeks. He has asked about anticoagulation therapy, and I would favor continuing Eliquis for at least another 3 months but I would defer this decision to [name]. All of his questions have been addressed. The patient has been encouraged to enroll in the outpatient cardiac rehabilitation program. He has been reminded to avoid any heavy lifting or strenuous use of his arms or shoulders for at least another 6-8 weeks. Return for routine follow-up and rhythm check in 4 months.   Valentina Gu. Roxy Manns, MD 08/29/2015 12:07 PM

## 2015-08-29 NOTE — Patient Instructions (Signed)
Continue to avoid any heavy lifting or strenuous use of your arms or shoulders for at least a total of three months from the time of surgery.  After three months you may gradually increase how much you lift or otherwise use your arms or chest as tolerated, with limits based upon whether or not activities lead to the return of significant discomfort.  You are encouraged to enroll and participate in the outpatient cardiac rehab program beginning as soon as practical.  Decrease amiodarone to 200 mg (1 tablet) daily

## 2015-09-07 ENCOUNTER — Ambulatory Visit (INDEPENDENT_AMBULATORY_CARE_PROVIDER_SITE_OTHER): Payer: Managed Care, Other (non HMO) | Admitting: Internal Medicine

## 2015-09-07 ENCOUNTER — Encounter: Payer: Self-pay | Admitting: Internal Medicine

## 2015-09-07 VITALS — BP 120/80 | HR 54 | Wt 273.4 lb

## 2015-09-07 DIAGNOSIS — I10 Essential (primary) hypertension: Secondary | ICD-10-CM

## 2015-09-07 DIAGNOSIS — I2510 Atherosclerotic heart disease of native coronary artery without angina pectoris: Secondary | ICD-10-CM | POA: Diagnosis not present

## 2015-09-07 NOTE — Patient Instructions (Signed)
Medication Instructions:  Your physician recommends that you continue on your current medications as directed. Please refer to the Current Medication list given to you today.   Labwork: None ordered   Testing/Procedures: None ordered   Follow-Up: Your physician recommends that you schedule a follow-up appointment in: 2 months with Roderic Palau, NP   Any Other Special Instructions Will Be Listed Below (If Applicable).     If you need a refill on your cardiac medications before your next appointment, please call your pharmacy.

## 2015-09-08 ENCOUNTER — Other Ambulatory Visit: Payer: Self-pay | Admitting: Family Medicine

## 2015-09-09 NOTE — Progress Notes (Signed)
Electrophysiology Office Note   Date:  09/09/2015   ID:  BARNDON Scott, DOB Jan 24, 1961, MRN [id number]  PCP:  [name], MD   Primary Electrophysiologist: Keldrick Pomplun   Chief Complaint  Patient presents with  . Persistent AFIB     History of Present Illness: Harold Scott is a 55 y.o. male who presents today for electrophysiology evaluation.    Doing very well since his MAZE.  Feels 'much better' in sinus.  Today, he denies symptoms of  chest pain, orthopnea, PND, lower extremity edema, claudication, dizziness, presyncope, syncope, bleeding, or neurologic sequela. The patient is tolerating medications without difficulties and is otherwise without complaint today.    Past Medical History  Diagnosis Date  . Anxiety   . Depression   . GERD (gastroesophageal reflux disease)   . Gallstones   . PONV (postoperative nausea and vomiting)   . Paroxysmal atrial fibrillation (HCC)     a.  previously intolerant of Flecainide and Multaq b. PVI by Dr [name] 09/21/14  . Obstructive sleep apnea      on CPAP  . Mitral regurgitation     mild  . Essential hypertension 07/15/2013  . Diastolic CHF, chronic ([place]) 07/18/2014  . Morbid obesity ([place]) 12/21/2014  . GERD 09/09/2008    Qualifier: Diagnosis of  By: Sherren Mocha MD, Jory Ee   . Hyperlipidemia   . Chronic diastolic (congestive) heart failure ([place])   . Coronary artery disease 06/20/2015  . Anginal pain ([place])   . Dysrhythmia     AFIB  . History of hiatal hernia   . Arthritis   . S/P Maze operation for atrial fibrillation 06/30/2015    Complete bilateral atrial lesion set using bipolar radiofrequency and cryothermy ablation with clipping of LA appendage via conventional median sternotomy   . 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 Hand surgery    . Tonsillectomy    . Wisdom tooth extraction    . Cholecystectomy  08/05/2012    Procedure: LAPAROSCOPIC  CHOLECYSTECTOMY WITH INTRAOPERATIVE CHOLANGIOGRAM;  Surgeon: [name], MD,FACS;  Location: [facility];  Service: General;  Laterality: N/A;  . Laparoscopic appendectomy N/A 02/13/2014    Procedure: APPENDECTOMY LAPAROSCOPIC;  Surgeon: [name], MD;  Location: [facility];  Service: General;  Laterality: N/A;  . Tee without cardioversion N/A 09/20/2014    Procedure: TRANSESOPHAGEAL ECHOCARDIOGRAM (TEE);  Surgeon: [name], MD;  Location: [facility];  Service: Cardiovascular;  Laterality: N/A;  . Ablation  09/21/14    PVI by Dr [name]  . Atrial fibrillation ablation N/A 09/21/2014    Procedure: ATRIAL FIBRILLATION ABLATION;  Surgeon: [name], MD;  Location: [facility] LAB;  Service: Cardiovascular;  Laterality: N/A;  . Tee without cardioversion N/A 01/10/2015    Procedure: TRANSESOPHAGEAL ECHOCARDIOGRAM (TEE);  Surgeon: Sanda Klein, MD;  Location: [facility] ENDOSCOPY;  Service: Cardiovascular;  Laterality: N/A;  . Electrophysiologic study N/A 01/11/2015    Procedure: Atrial Fibrillation Ablation;  Surgeon: [name], MD;  Location: [place] CV LAB;  Service: Cardiovascular;  Laterality: N/A;  . Cardiac catheterization N/A 06/20/2015    Procedure: Right/Left Heart Cath and Coronary Angiography;  Surgeon: Burnell Blanks, MD;  Location: [place] CV LAB;  Service: Cardiovascular;  Laterality: N/A;  . Coronary artery bypass graft N/A 06/30/2015    Procedure: CORONARY ARTERY BYPASS GRAFTING (CABG)X2 SEQ LIMA-DIAG-LAD;  Surgeon: Valentina Gu  Roxy Manns, MD;  Location: [place];  Service: Open Heart Surgery;  Laterality: N/A;  . Tee without cardioversion N/A 06/30/2015    Procedure: TRANSESOPHAGEAL ECHOCARDIOGRAM (TEE);  Surgeon: Rexene Alberts, MD;  Location: [place];  Service: Open Heart Surgery;  Laterality: N/A;  . Maze N/A 06/30/2015    Procedure: MAZE;  Surgeon: Rexene Alberts, MD;  Location: [place];  Service: Open Heart Surgery;  Laterality: N/A;     Current Outpatient Prescriptions    Medication Sig Dispense Refill  . amiodarone (PACERONE) 200 MG tablet Take 1 tablet (200 mg total) by mouth daily. 60 tablet 1  . apixaban (ELIQUIS) 5 MG TABS tablet Take 5 mg by mouth 2 (two) times daily.    Marland Kitchen aspirin EC 81 MG EC tablet Take 1 tablet (81 mg total) by mouth daily.    . cholestyramine (QUESTRAN) 4 G packet Take 4 g by mouth daily.     . clorazepate (TRANXENE) 7.5 MG tablet Take 1 tablet (7.5 mg total) by mouth at bedtime. 90 tablet 1  . esomeprazole (NEXIUM) 40 MG capsule Take 40 mg by mouth daily at 12 noon.    . furosemide (LASIX) 40 MG tablet Take 1 tablet (40 mg total) by mouth daily. 30 tablet 6  . guaiFENesin 200 MG tablet Take 200 mg by mouth 2 (two) times daily as needed (for cold).    Marland Kitchen lisinopril (PRINIVIL,ZESTRIL) 5 MG tablet Take 1 tablet (5 mg total) by mouth daily. 30 tablet 3  . metoprolol succinate (TOPROL-XL) 25 MG 24 hr tablet Take 1 tablet (25 mg total) by mouth 2 (two) times daily. 60 tablet 1  . potassium chloride SA (K-DUR,KLOR-CON) 20 MEQ tablet Take 1 tablet (20 mEq total) by mouth daily. 30 tablet 6  . simvastatin (ZOCOR) 20 MG tablet Take 1 tablet (20 mg total) by mouth daily at 6 PM. 30 tablet 11  . citalopram (CELEXA) 10 MG tablet TAKE 1 TABLET BY MOUTH ONCE DAILY 90 tablet 1   No current facility-administered medications for this visit.    Allergies:   Flecainide; Multaq; and Penicillins   Social History:  The patient  reports that he quit smoking about 9 years ago. His smoking use included Cigarettes. He has a 15 pack-year smoking history. He has never used smokeless tobacco. He reports that he drinks about 4.2 - 8.4 oz of alcohol per week. He reports that he does not use illicit drugs.   Family History:  Adopted and does not know FH    ROS:  Please see the history of present illness.     All other systems are reviewed and negative.    PHYSICAL EXAM: VS:  BP 120/80 mmHg  Pulse 54  Wt 273 lb 6.4 oz (124.013 kg) , BMI Body mass index is  41.58 kg/(m^2). GEN: overweight, in no acute distress HEENT: normal Neck: no JVD, carotid bruits, or masses Cardiac: iRRR; no murmurs, rubs, or gallops,no edema  Respiratory:  clear to auscultation bilaterally, normal work of breathing GI: soft, nontender, nondistended, + BS MS: no deformity or atrophy Skin: warm and dry,  Neuro:  Strength and sensation are intact Psych: euthymic mood, full affect  EKG:  EKG is ordered today. The ekg ordered today shows sinus rhythm 54 bpm, PR 172, nonspecific ST/T changes   Recent Labs: 12/21/2014: B Natriuretic Peptide 318.2* 04/11/2015: TSH 6.568* 06/28/2015: ALT 54 07/01/2015: Magnesium 2.2 07/05/2015: Hemoglobin 8.9*; Platelets 250 07/25/2015: BUN 10; Creat 1.01; Potassium 4.4; Sodium 136  Lipid Panel     Component Value Date/Time   CHOL 228* 07/19/2014 0425   TRIG 338* 07/19/2014 0425   HDL 30* 07/19/2014 0425   CHOLHDL 7.6 07/19/2014 0425   VLDL 68* 07/19/2014 0425   LDLCALC 130* 07/19/2014 0425     Wt Readings from Last 3 Encounters:  09/07/15 273 lb 6.4 oz (124.013 kg)  08/29/15 271 lb (122.925 kg)  08/17/15 271 lb 12.8 oz (123.288 kg)     ASSESSMENT AND PLAN:  1.  Persistent atrial fibrillation Doing very well since his MAZE Very pleased with reseults No changes today  2. CAD S/p CABG No ischemic symptoms  3. Overweight. He is working on weight reduction and regular exercise  4. OSA Compliance with CPAP is encouraged  Follow-up with Butch Penny in the AF clinic in 67months Stop amiodarone if no further AF at that time  Current medicines are reviewed at length with the patient today.   The patient is does not have concerns regarding his medicines.      Army Fossa, MD  09/09/2015 11:39 AM      [facility] [address] [place] [address] [phone] (office) [phone] (fax)

## 2015-09-21 ENCOUNTER — Other Ambulatory Visit (HOSPITAL_COMMUNITY): Payer: Self-pay | Admitting: Nurse Practitioner

## 2015-10-11 ENCOUNTER — Telehealth (HOSPITAL_COMMUNITY): Payer: Self-pay | Admitting: Cardiac Rehabilitation

## 2015-10-11 NOTE — Telephone Encounter (Signed)
pc to pt to discuss enrolling in cardiac rehab. Pt has high insurance copayment. Pt given information about cardiac maintenance program.  Pt would like to think about his options and call to schedule when he is ready.

## 2015-10-21 ENCOUNTER — Other Ambulatory Visit: Payer: Self-pay | Admitting: Internal Medicine

## 2015-10-24 ENCOUNTER — Other Ambulatory Visit: Payer: Self-pay | Admitting: *Deleted

## 2015-10-24 MED ORDER — CITALOPRAM HYDROBROMIDE 10 MG PO TABS: 10.0000 mg | ORAL_TABLET | Freq: Every day | ORAL | Status: DC

## 2015-10-26 ENCOUNTER — Other Ambulatory Visit: Payer: Self-pay | Admitting: *Deleted

## 2015-10-26 MED ORDER — LISINOPRIL 5 MG PO TABS: 5.0000 mg | ORAL_TABLET | Freq: Every day | ORAL | Status: DC

## 2015-11-07 ENCOUNTER — Encounter (HOSPITAL_COMMUNITY): Payer: Self-pay | Admitting: Nurse Practitioner

## 2015-11-07 ENCOUNTER — Ambulatory Visit (HOSPITAL_COMMUNITY)
Admission: RE | Admit: 2015-11-07 | Discharge: 2015-11-07 | Disposition: A | Discharge status: Home / Self Care | Payer: Managed Care, Other (non HMO) | Source: Ambulatory Visit | Attending: Nurse Practitioner | Admitting: Nurse Practitioner

## 2015-11-07 VITALS — BP 130/82 | HR 54 | Ht 68.0 in | Wt 273.0 lb

## 2015-11-07 DIAGNOSIS — I481 Persistent atrial fibrillation: Secondary | ICD-10-CM

## 2015-11-07 DIAGNOSIS — I4891 Unspecified atrial fibrillation: Secondary | ICD-10-CM | POA: Diagnosis present

## 2015-11-07 DIAGNOSIS — I4819 Other persistent atrial fibrillation: Secondary | ICD-10-CM

## 2015-11-07 NOTE — Patient Instructions (Signed)
Your physician has recommended you make the following change in your medication:  1)Stop Amiodarone    

## 2015-11-07 NOTE — Progress Notes (Signed)
Patient ID: Harold Scott, male   DOB: 31-Jan-1961, 55 y.o.   MRN: [id number]     Primary Care Physician: [name], MD Referring Physician: Dr. Minette Headland is a 55 y.o. male with a h/o persisitent afib that failed ablation, flecainide and multaq and underwent  CABG x 2 vessels, Maze procedure with left atrial appendage clipping, 06/30/15. Pt had early afib following surgery but with amiodarone went 78 days without afib. He is on the treadmill several times a week and is feeling well. He has lost around 25 lbs.He had to go to [place] last week due to his mother being sick and was under stress from that trip. The first day home, he had 12 hours of afib but he would not have known it if he had not had the app on his phone that checks his heart rate. Dr. Jackalyn Lombard last note from January, he said to stop amiodarone at this visit if no significant afib. I discussed with pt trying to stop or waiting a few more months and he would like to try to stop drug. He continues on eliquis for a chadsvasc score of 3. Dr. Roxy Manns mentioned with h/o LAA clipping he may be able to come off blood thinner but will discuss further with [name].  Today, he denies symptoms of palpitations, chest pain, shortness of breath, orthopnea, PND, lower extremity edema, dizziness, presyncope, syncope, or neurologic sequela. The patient is tolerating medications without difficulties and is otherwise without complaint today.   Past Medical History  Diagnosis Date  . Anxiety   . Depression   . GERD (gastroesophageal reflux disease)   . Gallstones   . PONV (postoperative nausea and vomiting)   . Paroxysmal atrial fibrillation (HCC)     a.  previously intolerant of Flecainide and Multaq b. PVI by Dr [name] 09/21/14  . Obstructive sleep apnea      on CPAP  . Mitral regurgitation     mild  . Essential hypertension 07/15/2013  . Diastolic CHF, chronic ([place]) 07/18/2014  . Morbid obesity ([place]) 12/21/2014  . GERD  09/09/2008    Qualifier: Diagnosis of  By: Sherren Mocha MD, Jory Ee   . Hyperlipidemia   . Chronic diastolic (congestive) heart failure ([place])   . Coronary artery disease 06/20/2015  . Anginal pain ([place])   . Dysrhythmia     AFIB  . History of hiatal hernia   . Arthritis   . S/P Maze operation for atrial fibrillation 06/30/2015    Complete bilateral atrial lesion set using bipolar radiofrequency and cryothermy ablation with clipping of LA appendage via conventional median sternotomy   . 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 Hand surgery    . Tonsillectomy    . Wisdom tooth extraction    . Cholecystectomy  08/05/2012    Procedure: LAPAROSCOPIC CHOLECYSTECTOMY WITH INTRAOPERATIVE CHOLANGIOGRAM;  Surgeon: [name], MD,FACS;  Location: [place] [facility];  Service: General;  Laterality: N/A;  . Laparoscopic appendectomy N/A 02/13/2014    Procedure: APPENDECTOMY LAPAROSCOPIC;  Surgeon: [name], MD;  Location: [facility];  Service: General;  Laterality: N/A;  . Tee without cardioversion N/A 09/20/2014    Procedure: TRANSESOPHAGEAL ECHOCARDIOGRAM (TEE);  Surgeon: [name], MD;  Location: [facility];  Service: Cardiovascular;  Laterality: N/A;  . Ablation  09/21/14    PVI by Dr [name]  .  Atrial fibrillation ablation N/A 09/21/2014    Procedure: ATRIAL FIBRILLATION ABLATION;  Surgeon: [name], MD;  Location: [facility] CATH LAB;  Service: Cardiovascular;  Laterality: N/A;  . Tee without cardioversion N/A 01/10/2015    Procedure: TRANSESOPHAGEAL ECHOCARDIOGRAM (TEE);  Surgeon: Sanda Klein, MD;  Location: [facility] ENDOSCOPY;  Service: Cardiovascular;  Laterality: N/A;  . Electrophysiologic study N/A 01/11/2015    Procedure: Atrial Fibrillation Ablation;  Surgeon: [name], MD;  Location: [place] CV LAB;  Service: Cardiovascular;  Laterality: N/A;  . Cardiac catheterization N/A 06/20/2015    Procedure: Right/Left Heart  Cath and Coronary Angiography;  Surgeon: Burnell Blanks, MD;  Location: [place] CV LAB;  Service: Cardiovascular;  Laterality: N/A;  . Coronary artery bypass graft N/A 06/30/2015    Procedure: CORONARY ARTERY BYPASS GRAFTING (CABG)X2 SEQ LIMA-DIAG-LAD;  Surgeon: Rexene Alberts, MD;  Location: [place];  Service: Open Heart Surgery;  Laterality: N/A;  . Tee without cardioversion N/A 06/30/2015    Procedure: TRANSESOPHAGEAL ECHOCARDIOGRAM (TEE);  Surgeon: Rexene Alberts, MD;  Location: [place];  Service: Open Heart Surgery;  Laterality: N/A;  . Maze N/A 06/30/2015    Procedure: MAZE;  Surgeon: Rexene Alberts, MD;  Location: [place];  Service: Open Heart Surgery;  Laterality: N/A;    Current Outpatient Prescriptions  Medication Sig Dispense Refill  . apixaban (ELIQUIS) 5 MG TABS tablet Take 5 mg by mouth 2 (two) times daily.    Marland Kitchen aspirin EC 81 MG EC tablet Take 1 tablet (81 mg total) by mouth daily.    . cholestyramine (QUESTRAN) 4 G packet Take 4 g by mouth daily.     . citalopram (CELEXA) 10 MG tablet Take 1 tablet (10 mg total) by mouth daily. 90 tablet 1  . clorazepate (TRANXENE) 7.5 MG tablet Take 1 tablet (7.5 mg total) by mouth at bedtime. 90 tablet 1  . esomeprazole (NEXIUM) 40 MG capsule Take 40 mg by mouth daily at 12 noon.    . furosemide (LASIX) 40 MG tablet TAKE 1 TABLET (40 MG TOTAL) BY MOUTH DAILY. 30 tablet 6  . KLOR-CON M20 20 MEQ tablet TAKE 1 TABLET (20 MEQ TOTAL) BY MOUTH DAILY. 30 tablet 6  . lisinopril (PRINIVIL,ZESTRIL) 5 MG tablet Take 1 tablet (5 mg total) by mouth daily. 90 tablet 2  . metoprolol succinate (TOPROL-XL) 25 MG 24 hr tablet Take 1 tablet (25 mg total) by mouth 2 (two) times daily. 60 tablet 1  . simvastatin (ZOCOR) 20 MG tablet Take 1 tablet (20 mg total) by mouth daily at 6 PM. 30 tablet 11  . guaiFENesin 200 MG tablet Take 200 mg by mouth 2 (two) times daily as needed (for cold).     No current facility-administered medications for this encounter.     Allergies  Allergen Reactions  . Flecainide Shortness Of Breath and Other (See Comments)    EXTREME SOB  . Multaq [Dronedarone] Shortness Of Breath  . Penicillins Hives    Social History   Social History  . Marital Status: Married    Spouse Name: N/A  . Number of Children: 1  . Years of Education: N/A   Occupational History  . Courier    Social History Main Topics  . Smoking status: Former Smoker -- 0.50 packs/day for 30 years    Types: Cigarettes    Quit date: 07/23/2006  . Smokeless tobacco: Never Used  . Alcohol Use: 4.2 - 8.4 oz/week    7-14 Standard drinks or equivalent  per week     Comment: social  . Drug Use: No  . Sexual Activity: Not on file   Other Topics Concern  . Not on file   Social History Narrative   Pt works as a Secondary school teacher    Family History  Problem Relation Age of Onset  . Adopted: Yes  . Other      ADOPTED    ROS- All systems are reviewed and negative except as per the HPI above  Physical Exam: Filed Vitals:   11/07/15 1516  BP: 130/82  Pulse: 54  Height: 5\' 8"  (1.727 m)  Weight: 273 lb (123.832 kg)    GEN- The patient is well appearing, alert and oriented x 3 today.   Head- normocephalic, atraumatic Eyes-  Sclera clear, conjunctiva pink Ears- hearing intact Oropharynx- clear Neck- supple, no JVP Lymph- no cervical lymphadenopathy Lungs- Clear to ausculation bilaterally, normal work of breathing Heart- Regular rate and rhythm, no murmurs, rubs or gallops, PMI not laterally displaced GI- soft, NT, ND, + BS Extremities- no clubbing, cyanosis, or edema MS- no significant deformity or atrophy Skin- no rash or lesion Psych- euthymic mood, full affect Neuro- strength and sensation are intact  EKG-Sinus brady at 54 bpm, pr int 164 ms, qrs int 112 ms, qtc 468 ms. Epic records reviewed  Assessment and Plan: 1. Persistent afib  S/p maze procedure Will stop amiodarone Continue eliquis for now but will discuss with Dr.  [name] if pt can stop blood thinner with h/o LAA clipping, chadsvasc score of 3.  2. CAD S/p bypass x 2 vessels Stable Encouraged to continue exercise program and weight loss efforts  Butch Penny C. Barak Bialecki, [facility] [address] [place], [place] [address] [phone]

## 2015-11-14 ENCOUNTER — Encounter: Payer: Self-pay | Admitting: Nurse Practitioner

## 2015-11-16 ENCOUNTER — Other Ambulatory Visit: Payer: Self-pay | Admitting: *Deleted

## 2015-11-16 MED ORDER — SIMVASTATIN 20 MG PO TABS: 20.0000 mg | ORAL_TABLET | Freq: Every day | ORAL | Status: DC

## 2015-12-11 ENCOUNTER — Emergency Department (HOSPITAL_COMMUNITY): Payer: Managed Care, Other (non HMO)

## 2015-12-11 ENCOUNTER — Emergency Department (HOSPITAL_COMMUNITY)
Admission: EM | Admit: 2015-12-11 | Discharge: 2015-12-11 | Disposition: A | Discharge status: Home / Self Care | Payer: Managed Care, Other (non HMO) | Attending: Emergency Medicine | Admitting: Emergency Medicine

## 2015-12-11 DIAGNOSIS — K219 Gastro-esophageal reflux disease without esophagitis: Secondary | ICD-10-CM | POA: Insufficient documentation

## 2015-12-11 DIAGNOSIS — Z7901 Long term (current) use of anticoagulants: Secondary | ICD-10-CM | POA: Insufficient documentation

## 2015-12-11 DIAGNOSIS — G4733 Obstructive sleep apnea (adult) (pediatric): Secondary | ICD-10-CM | POA: Insufficient documentation

## 2015-12-11 DIAGNOSIS — S161XXA Strain of muscle, fascia and tendon at neck level, initial encounter: Secondary | ICD-10-CM | POA: Diagnosis not present

## 2015-12-11 DIAGNOSIS — Y929 Unspecified place or not applicable: Secondary | ICD-10-CM | POA: Diagnosis not present

## 2015-12-11 DIAGNOSIS — Y93K1 Activity, walking an animal: Secondary | ICD-10-CM | POA: Insufficient documentation

## 2015-12-11 DIAGNOSIS — Z7982 Long term (current) use of aspirin: Secondary | ICD-10-CM | POA: Diagnosis not present

## 2015-12-11 DIAGNOSIS — F329 Major depressive disorder, single episode, unspecified: Secondary | ICD-10-CM | POA: Insufficient documentation

## 2015-12-11 DIAGNOSIS — I480 Paroxysmal atrial fibrillation: Secondary | ICD-10-CM | POA: Insufficient documentation

## 2015-12-11 DIAGNOSIS — Z6841 Body Mass Index (BMI) 40.0 and over, adult: Secondary | ICD-10-CM | POA: Insufficient documentation

## 2015-12-11 DIAGNOSIS — I110 Hypertensive heart disease with heart failure: Secondary | ICD-10-CM | POA: Diagnosis not present

## 2015-12-11 DIAGNOSIS — I5032 Chronic diastolic (congestive) heart failure: Secondary | ICD-10-CM | POA: Diagnosis not present

## 2015-12-11 DIAGNOSIS — Z79899 Other long term (current) drug therapy: Secondary | ICD-10-CM | POA: Diagnosis not present

## 2015-12-11 DIAGNOSIS — Y999 Unspecified external cause status: Secondary | ICD-10-CM | POA: Diagnosis not present

## 2015-12-11 DIAGNOSIS — W1839XA Other fall on same level, initial encounter: Secondary | ICD-10-CM | POA: Diagnosis not present

## 2015-12-11 DIAGNOSIS — Z951 Presence of aortocoronary bypass graft: Secondary | ICD-10-CM | POA: Insufficient documentation

## 2015-12-11 DIAGNOSIS — Z87891 Personal history of nicotine dependence: Secondary | ICD-10-CM | POA: Diagnosis not present

## 2015-12-11 DIAGNOSIS — S20212A Contusion of left front wall of thorax, initial encounter: Secondary | ICD-10-CM | POA: Diagnosis not present

## 2015-12-11 DIAGNOSIS — E785 Hyperlipidemia, unspecified: Secondary | ICD-10-CM | POA: Insufficient documentation

## 2015-12-11 DIAGNOSIS — S301XXA Contusion of abdominal wall, initial encounter: Secondary | ICD-10-CM | POA: Diagnosis not present

## 2015-12-11 DIAGNOSIS — Z9049 Acquired absence of other specified parts of digestive tract: Secondary | ICD-10-CM | POA: Diagnosis not present

## 2015-12-11 DIAGNOSIS — I2510 Atherosclerotic heart disease of native coronary artery without angina pectoris: Secondary | ICD-10-CM | POA: Insufficient documentation

## 2015-12-11 DIAGNOSIS — W19XXXA Unspecified fall, initial encounter: Secondary | ICD-10-CM

## 2015-12-11 DIAGNOSIS — M25512 Pain in left shoulder: Secondary | ICD-10-CM | POA: Diagnosis present

## 2015-12-11 LAB — BASIC METABOLIC PANEL
Anion gap: 8 (ref 5–15)
BUN: 12 mg/dL (ref 6–20)
CALCIUM: 9.3 mg/dL (ref 8.9–10.3)
CHLORIDE: 102 mmol/L (ref 101–111)
CO2: 27 mmol/L (ref 22–32)
CREATININE: 0.86 mg/dL (ref 0.61–1.24)
GFR calc non Af Amer: >60 mL/min (ref >60)
GLUCOSE: 160 mg/dL — AB (ref 65–99)
Potassium: 4.2 mmol/L (ref 3.5–5.1)
Sodium: 137 mmol/L (ref 135–145)

## 2015-12-11 LAB — CBC
HCT: 38.6 % — ABNORMAL LOW (ref 39.0–52.0)
Hemoglobin: 12.6 g/dL — ABNORMAL LOW (ref 13.0–17.0)
MCH: 30.6 pg (ref 26.0–34.0)
MCHC: 32.6 g/dL (ref 30.0–36.0)
MCV: 93.7 fL (ref 78.0–100.0)
PLATELETS: 241 10*3/uL (ref 150–400)
RBC: 4.12 MIL/uL — AB (ref 4.22–5.81)
RDW: 14.4 % (ref 11.5–15.5)
WBC: 15.5 10*3/uL — ABNORMAL HIGH (ref 4.0–10.5)

## 2015-12-11 LAB — I-STAT TROPONIN, ED: TROPONIN I, POC: 0.02 ng/mL (ref 0.00–0.08)

## 2015-12-11 LAB — LIPASE, BLOOD: Lipase: 21 U/L (ref 11–51)

## 2015-12-11 MED ORDER — MORPHINE SULFATE (PF) 4 MG/ML IV SOLN
4.0000 mg | Freq: Once | INTRAVENOUS | Status: AC
  Administered 2015-12-11: 4 mg via INTRAVENOUS
  Filled 2015-12-11: qty 1

## 2015-12-11 MED ORDER — HYDROCODONE-ACETAMINOPHEN 5-325 MG PO TABS: 1.0000 | ORAL_TABLET | Freq: Four times a day (QID) | ORAL | Status: DC | PRN

## 2015-12-11 MED ORDER — IOPAMIDOL (ISOVUE-300) INJECTION 61%
100.0000 mL | Freq: Once | INTRAVENOUS | Status: AC | PRN
  Administered 2015-12-11: 100 mL via INTRAVENOUS

## 2015-12-11 NOTE — ED Notes (Addendum)
Pt c/o 8/10 left shoulder and rib cage pain that awoke him today. Pt states he began feeling diaphoretic and sob with the onset of this pain. Pt states he fell while walking his dog on Wednesday and this could be a result of that fall.  Pt non-diaphoretic in triage, sob w/ RR of 25, A+OX4, speaking in complete sentences.

## 2015-12-11 NOTE — Discharge Instructions (Signed)
Hydrocodone as prescribed as needed for pain.  Follow-up with your primary Dr. if not improving in the next week, and return to the ER if symptoms significant only worsen or change.   Contusion A contusion is a deep bruise. Contusions are the result of a blunt injury to tissues and muscle fibers under the skin. The injury causes bleeding under the skin. The skin overlying the contusion may turn blue, purple, or yellow. Minor injuries will give you a painless contusion, but more severe contusions may stay painful and swollen for a few weeks.  CAUSES  This condition is usually caused by a blow, trauma, or direct force to an area of the body. SYMPTOMS  Symptoms of this condition include:  Swelling of the injured area.  Pain and tenderness in the injured area.  Discoloration. The area may have redness and then turn blue, purple, or yellow. DIAGNOSIS  This condition is diagnosed based on a physical exam and medical history. An X-ray, CT scan, or MRI may be needed to determine if there are any associated injuries, such as broken bones (fractures). TREATMENT  Specific treatment for this condition depends on what area of the body was injured. In general, the best treatment for a contusion is resting, icing, applying pressure to (compression), and elevating the injured area. This is often called the RICE strategy. Over-the-counter anti-inflammatory medicines may also be recommended for pain control.  HOME CARE INSTRUCTIONS   Rest the injured area.  If directed, apply ice to the injured area:  Put ice in a plastic bag.  Place a towel between your skin and the bag.  Leave the ice on for 20 minutes, 2-3 times per day.  If directed, apply light compression to the injured area using an elastic bandage. Make sure the bandage is not wrapped too tightly. Remove and reapply the bandage as directed by your health care provider.  If possible, raise (elevate) the injured area above the level of your  heart while you are sitting or lying down.  Take over-the-counter and prescription medicines only as told by your health care provider. SEEK MEDICAL CARE IF:  Your symptoms do not improve after several days of treatment.  Your symptoms get worse.  You have difficulty moving the injured area. SEEK IMMEDIATE MEDICAL CARE IF:   You have severe pain.  You have numbness in a hand or foot.  Your hand or foot turns pale or cold.   This information is not intended to replace advice given to you by your health care provider. Make sure you discuss any questions you have with your health care provider.   Document Released: 05/16/2005 Document Revised: 04/27/2015 Document Reviewed: 12/22/2014 Elsevier Interactive Patient Education [employer].

## 2015-12-11 NOTE — ED Provider Notes (Signed)
CSN: [id number]     Arrival date & time 12/11/15  0353 History   First MD Initiated Contact with Patient 12/11/15 [id number]     Chief Complaint  Patient presents with  . Shoulder Pain  . Chest Pain    left rib pain     (Consider location/radiation/quality/duration/timing/severity/associated sxs/prior Treatment) HPI Comments: Patient is a 55 year old male with past medical history of coronary artery disease with bypass surgery, sleep apnea, hypertension. He presents for evaluation of fall. He reports 2 days ago he was walking his dog when the dog pulled the leash and caused him to fall. He landed on his left side. He has been having rapidly worsening discomfort in his left upper abdomen and left lateral chest wall. This pain radiates into his neck. He denies any difficulty breathing. He denies any vomiting or diarrhea.  Patient is a 55 y.o. male presenting with fall. The history is provided by the patient.  Fall This is a new problem. The current episode started 2 days ago. The problem occurs constantly. The problem has been rapidly worsening. Associated symptoms include chest pain and abdominal pain. Pertinent negatives include no shortness of breath. Nothing aggravates the symptoms. Nothing relieves the symptoms. Treatments tried: Tramadol. The treatment provided no relief.    Past Medical History  Diagnosis Date  . Anxiety   . Depression   . GERD (gastroesophageal reflux disease)   . Gallstones   . PONV (postoperative nausea and vomiting)   . Paroxysmal atrial fibrillation (HCC)     a.  previously intolerant of Flecainide and Multaq b. PVI by Dr [name] 09/21/14 c. s/p MAZE Dr Roxy Manns 06/2015  . Obstructive sleep apnea     a. on CPAP  . Mitral regurgitation   . Essential hypertension   . Diastolic CHF, chronic ([place])   . Morbid obesity ([place])   . Hyperlipidemia   . Coronary artery disease     a. s/p CABG 06/2015 LIMA to D1 and distal LAD, sequentially  . History of hiatal hernia   .  Arthritis   . S/P Maze operation for atrial fibrillation 06/30/2015    Complete bilateral atrial lesion set using bipolar radiofrequency and cryothermy ablation with clipping of LA appendage via conventional median sternotomy    Past Surgical History  Procedure Laterality Date  . Hand surgery    . Tonsillectomy    . Wisdom tooth extraction    . Cholecystectomy  08/05/2012    Procedure: LAPAROSCOPIC CHOLECYSTECTOMY WITH INTRAOPERATIVE CHOLANGIOGRAM;  Surgeon: [name], MD,FACS;  Location: [place];  Service: General;  Laterality: N/A;  . Laparoscopic appendectomy N/A 02/13/2014    Procedure: APPENDECTOMY LAPAROSCOPIC;  Surgeon: [name], MD;  Location: [facility];  Service: General;  Laterality: N/A;  . Tee without cardioversion N/A 09/20/2014    Procedure: TRANSESOPHAGEAL ECHOCARDIOGRAM (TEE);  Surgeon: [name], MD;  Location: [facility];  Service: Cardiovascular;  Laterality: N/A;  . Ablation  09/21/14    PVI by Dr [name]  . Atrial fibrillation ablation N/A 09/21/2014    Procedure: ATRIAL FIBRILLATION ABLATION;  Surgeon: [name], MD;  Location[facility] C[facility] LAB;  Service: Cardiovascular;  Laterality: N/A;  . Tee without cardioversion N/A 01/10/2015    Procedure: TRANSESOPHAGEAL ECHOCARDIOGRAM (TEE);  Surgeon: Sanda Klein, MD;  [facility] ENDOSCOPY;  Service: Cardiovascular;  Laterality: N/A;  . Electrophysiologic study N/A 01/11/2015    Procedure: Atrial Fibrillation Ablation;  Surgeon: [name], MD;  Location: [place] CV LAB;  Service: Cardiovascular;  Laterality: N/A;  . Cardiac catheterization N/A 06/20/2015    Procedure: Right/Left Heart Cath and Coronary Angiography;  Surgeon: Burnell Blanks, MD;  Location: [place] CV LAB;  Service: Cardiovascular;  Laterality: N/A;  . Coronary artery bypass graft N/A 06/30/2015    Procedure: CORONARY ARTERY BYPASS GRAFTING (CABG)X2 SEQ LIMA-DIAG-LAD;  Surgeon: Rexene Alberts, MD;  Location: [place];  Service: Open Heart  Surgery;  Laterality: N/A;  . Tee without cardioversion N/A 06/30/2015    Procedure: TRANSESOPHAGEAL ECHOCARDIOGRAM (TEE);  Surgeon: Rexene Alberts, MD;  Location: [place];  Service: Open Heart Surgery;  Laterality: N/A;  . Maze N/A 06/30/2015    Procedure: MAZE;  Surgeon: Rexene Alberts, MD;  Location: [place];  Service: Open Heart Surgery;  Laterality: N/A;   Family History  Problem Relation Age of Onset  . Adopted: Yes  . Other      ADOPTED   Social History  Substance Use Topics  . Smoking status: Former Smoker -- 0.50 packs/day for 30 years    Types: Cigarettes    Quit date: 07/23/2006  . Smokeless tobacco: Never Used  . Alcohol Use: 4.2 - 8.4 oz/week    7-14 Standard drinks or equivalent per week     Comment: social    Review of Systems  Respiratory: Negative for shortness of breath.   Cardiovascular: Positive for chest pain.  Gastrointestinal: Positive for abdominal pain.  All other systems reviewed and are negative.     Allergies  Flecainide; Multaq; and Penicillins  Home Medications   Prior to Admission medications   Medication Sig Start Date End Date Taking? Authorizing Provider  apixaban (ELIQUIS) 5 MG TABS tablet Take 5 mg by mouth 2 (two) times daily.    Historical Provider, MD  aspirin EC 81 MG EC tablet Take 1 tablet (81 mg total) by mouth daily. 07/07/15   Erin R Barrett, PA-C  cholestyramine (QUESTRAN) 4 G packet Take 4 g by mouth daily.     Historical Provider, MD  citalopram (CELEXA) 10 MG tablet Take 1 tablet (10 mg total) by mouth daily. 10/24/15   Dorena Cookey, MD  clorazepate (TRANXENE) 7.5 MG tablet Take 1 tablet (7.5 mg total) by mouth at bedtime. 08/12/15   Dorena Cookey, MD  esomeprazole (NEXIUM) 40 MG capsule Take 40 mg by mouth daily at 12 noon.    Historical Provider, MD  furosemide (LASIX) 40 MG tablet TAKE 1 TABLET (40 MG TOTAL) BY MOUTH DAILY. 09/21/15   Sherran Needs, NP  guaiFENesin 200 MG tablet Take 200 mg by mouth 2 (two) times daily  as needed (for cold).    Historical Provider, MD  KLOR-CON M20 20 MEQ tablet TAKE 1 TABLET (20 MEQ TOTAL) BY MOUTH DAILY. 09/21/15   Sherran Needs, NP  lisinopril (PRINIVIL,ZESTRIL) 5 MG tablet Take 1 tablet (5 mg total) by mouth daily. 10/26/15   [name], MD  metoprolol succinate (TOPROL-XL) 25 MG 24 hr tablet Take 1 tablet (25 mg total) by mouth 2 (two) times daily. 12/24/14   [name], MD  simvastatin (ZOCOR) 20 MG tablet Take 1 tablet (20 mg total) by mouth daily at 6 PM. 11/16/15   [name], MD   BP 136/82 mmHg  Pulse 64  Temp(Src) 97.8 F (36.6 C) (Oral)  Resp 19  Ht 5\' 7"  (1.702 m)  Wt 268 lb (121.564 kg)  BMI 41.96 kg/m2  SpO2 96% Physical Exam  Constitutional: He is oriented to person, place,  and time. He appears well-developed and well-nourished. No distress.  HENT:  Head: Normocephalic and atraumatic.  Neck: Normal range of motion. Neck supple.  There is no cervical spine tenderness or step-off. There is tenderness in the soft tissues of the left lateral cervical region.  Cardiovascular: Normal rate, regular rhythm and normal heart sounds.   No murmur heard. Pulmonary/Chest: Effort normal and breath sounds normal. No respiratory distress. He has no wheezes. He exhibits tenderness.  There is tenderness over the left lateral wall. There is no crepitus.  Abdominal: Soft. Bowel sounds are normal. He exhibits no distension. There is tenderness.  There is tenderness to palpation in the left upper abdomen and left flank.  Musculoskeletal: Normal range of motion. He exhibits no edema.  Neurological: He is alert and oriented to person, place, and time.  Skin: Skin is warm and dry. He is not diaphoretic.  Nursing note and vitals reviewed.   ED Course  Procedures (including critical care time) Labs Review Labs Reviewed  BASIC METABOLIC PANEL  CBC  LIPASE, BLOOD  I-STAT [place], ED    Imaging Review Dg Chest 2 View  12/11/2015  CLINICAL DATA:  Left shoulder and  rib cage pain today. Diaphoresis and shortness of breath. Fell on Wednesday. EXAM: CHEST  2 VIEW COMPARISON:  08/01/2015 FINDINGS: Slightly shallow inspiration. Cardiac enlargement with mild pulmonary vascular congestion. No focal airspace disease or consolidation. No blunting of costophrenic angles. No pneumothorax. Postoperative changes in the mediastinum. Degenerative changes in the spine. IMPRESSION: Cardiac enlargement with mild vascular congestion. No edema or consolidation. Electronically Signed   By: Lucienne Capers M.D.   On: 12/11/2015 04:55   I have personally reviewed and evaluated these images and lab results as part of my medical decision-making.   EKG Interpretation   Date/Time:  Sunday December 11 2015 04:20:31 EDT Ventricular Rate:  63 PR Interval:  165 QRS Duration: 111 QT Interval:  459 QTC Calculation: 470 R Axis:   -10 Text Interpretation:  Sinus rhythm Consider left atrial enlargement  Borderline repol abnrm, anterolateral leads Minimal ST elevation, inferior  leads Baseline wander in lead(s) V3 Confirmed by Alanie Syler  MD, Hrishikesh Hoeg ([id number])  on 12/11/2015 5:03:53 AM      MDM   Final diagnoses:  None    Patient is a 55 year old male presents after a fall that occurred 2 days ago. He takes Eliquis for atrial fibrillation. He is complaining of significant pain in his left lateral ribs and left lateral abdomen that radiates into his neck. I see nothing on CT scan that would explain his symptoms. There is no evidence for pneumothorax, rib fracture, spleen rupture, or other obvious abnormality. He will be discharged with pain medication and I follow up with his primary Dr. if not improving in the next week.    Veryl Speak, MD 12/11/15 [phone]

## 2015-12-27 ENCOUNTER — Other Ambulatory Visit: Payer: Managed Care, Other (non HMO)

## 2016-01-02 ENCOUNTER — Ambulatory Visit: Payer: Managed Care, Other (non HMO) | Admitting: Thoracic Surgery (Cardiothoracic Vascular Surgery)

## 2016-01-03 ENCOUNTER — Encounter: Payer: Managed Care, Other (non HMO) | Admitting: Family Medicine

## 2016-01-23 ENCOUNTER — Encounter: Payer: Self-pay | Admitting: Thoracic Surgery (Cardiothoracic Vascular Surgery)

## 2016-01-23 ENCOUNTER — Ambulatory Visit (INDEPENDENT_AMBULATORY_CARE_PROVIDER_SITE_OTHER): Payer: Managed Care, Other (non HMO) | Admitting: Thoracic Surgery (Cardiothoracic Vascular Surgery)

## 2016-01-23 VITALS — BP 149/85 | HR 60 | Resp 20 | Ht 67.0 in | Wt 268.0 lb

## 2016-01-23 DIAGNOSIS — Z8679 Personal history of other diseases of the circulatory system: Secondary | ICD-10-CM | POA: Diagnosis not present

## 2016-01-23 DIAGNOSIS — I2510 Atherosclerotic heart disease of native coronary artery without angina pectoris: Secondary | ICD-10-CM | POA: Diagnosis not present

## 2016-01-23 DIAGNOSIS — Z951 Presence of aortocoronary bypass graft: Secondary | ICD-10-CM

## 2016-01-23 DIAGNOSIS — Z9889 Other specified postprocedural states: Secondary | ICD-10-CM | POA: Diagnosis not present
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
## 2016-01-23 NOTE — Patient Instructions (Signed)
Continue all previous medications without any changes at this time  

## 2016-02-01 ENCOUNTER — Other Ambulatory Visit: Payer: Self-pay | Admitting: Family Medicine

## 2016-02-13 ENCOUNTER — Other Ambulatory Visit: Payer: Self-pay | Admitting: Family Medicine

## 2016-02-15 ENCOUNTER — Other Ambulatory Visit: Payer: Self-pay | Admitting: Family Medicine

## 2016-02-15 NOTE — Telephone Encounter (Signed)
Pt need new Rx for clorazepate    Pharm:  [facility]

## 2016-02-16 MED ORDER — CLORAZEPATE DIPOTASSIUM 7.5 MG PO TABS: 7.5000 mg | ORAL_TABLET | Freq: Every day | ORAL | Status: DC

## 2016-02-16 NOTE — Telephone Encounter (Signed)
Spoke to patient. Advised 10 tabs phone-in to pharmacy per LPN-Donna advice. Confirmed appt must be kept to receive further refills.  Patient verbalized understanding.

## 2016-02-16 NOTE — Telephone Encounter (Addendum)
Spoke with patient. Advised appt needed to receive Rx for clorazepate.  Last OV 08/06/13 . Patient his last scheduled CPx was cancld by office in 05/17, due to provider reschedule.Patient was r/s for next pcoming appt in Nov. with Dr. Sherren Mocha.  Patient states he is completely out of med. Advised patient to schedule appt with NP-Julie for med management.  The earliest he could schedule OV is for 02/23/2016.  Patient requesting to receive enough tablets until appt.  Advised would escalate and call back once reviewed. Patient agreed.

## 2016-02-23 ENCOUNTER — Encounter: Payer: Self-pay | Admitting: Family Medicine

## 2016-02-23 ENCOUNTER — Ambulatory Visit (INDEPENDENT_AMBULATORY_CARE_PROVIDER_SITE_OTHER): Payer: Managed Care, Other (non HMO) | Admitting: Family Medicine

## 2016-02-23 VITALS — BP 118/76 | HR 65 | Temp 97.8°F | Ht 67.0 in | Wt 282.7 lb

## 2016-02-23 DIAGNOSIS — G4700 Insomnia, unspecified: Secondary | ICD-10-CM

## 2016-02-23 DIAGNOSIS — F419 Anxiety disorder, unspecified: Secondary | ICD-10-CM | POA: Diagnosis not present

## 2016-02-23 DIAGNOSIS — K219 Gastro-esophageal reflux disease without esophagitis: Secondary | ICD-10-CM | POA: Diagnosis not present

## 2016-02-23 MED ORDER — CLORAZEPATE DIPOTASSIUM 7.5 MG PO TABS: 7.5000 mg | ORAL_TABLET | Freq: Every day | ORAL | Status: DC

## 2016-02-23 MED ORDER — ESOMEPRAZOLE MAGNESIUM 40 MG PO CPDR: 40.0000 mg | DELAYED_RELEASE_CAPSULE | Freq: Every day | ORAL | Status: DC

## 2016-02-23 NOTE — Progress Notes (Signed)
Subjective:    Patient ID: Harold Scott, male    DOB: 09/18/1960, 55 y.o.   MRN: [id number]  HPI  Harold Scott is a 55 year old male who presents today for follow up. I am seeing him in place of his PCP who is unavailable at this time. He is returning to clinic for follow up of anxiety and GERD treatment.  Anxiety:  Patient reports excellent benefit with citalopram 10 mg daily. He also reports using 7.5 mg of clorazepate at night for sleep which has provides excellent benefit. He reports that he would like to consider weaning off of citalopram and clorazepate in the future however he is caring for elderly parents at this time and has increased stress. He is concerned that he will have an exacerbation with his anxiety and would like to hold off on discontinuing his current regimen. He has a follow up with his PCP in 4 months and will revisit this at that time. He denies depressed mood.  GERD: Nexium controls symptoms of heartburn. Patient denies avoiding triggers of reflux of caffeine, chocolate, alcohol, peppermint, spicy foods, and eating 2 hours prior to bedtime. He states that he does not have any reflux if he takes this medication daily but will notice it if he does not. He denies dysphagia, melena, hematochezia, and reflux symptoms today.  Pertinent medical history includes CABG x 2 and maze procedure on 06/2015.  He is followed by cardiology and remains in sinus rhythm since surgical treatment. He reports monitoring his heart rhythm daily.  He denies chest pain, palpitations, SOB, orthopnea, PND, edema, dyspnea, headaches, lightheadedness, numbness, tingling, or weakness. He states that he feels very well at this time.    Review of Systems  Constitutional: Negative for fever, chills and fatigue.  Eyes: Negative for visual disturbance.  Respiratory: Negative for shortness of breath.   Cardiovascular: Negative for chest pain and palpitations.  Gastrointestinal: Negative for nausea,  vomiting, abdominal pain, diarrhea and constipation.  Endocrine: Negative for polydipsia and polyuria.  Musculoskeletal: Negative for myalgias.  Skin: Negative for rash.  Neurological: Negative for dizziness, light-headedness and headaches.  Psychiatric/Behavioral:       Denies depressed or anxious mood. History of anxiety that is well controlled with citalopram.    Past Medical History  Diagnosis Date  . Anxiety   . Depression   . GERD (gastroesophageal reflux disease)   . Gallstones   . PONV (postoperative nausea and vomiting)   . Paroxysmal atrial fibrillation (HCC)     a.  previously intolerant of Flecainide and Multaq b. PVI by Dr [name] 09/21/14 c. s/p MAZE Dr Roxy Manns 06/2015  . Obstructive sleep apnea     a. on CPAP  . Mitral regurgitation   . Essential hypertension   . Diastolic CHF, chronic ([place])   . Morbid obesity ([place])   . Hyperlipidemia   . Coronary artery disease     a. s/p CABG 06/2015 LIMA to D1 and distal LAD, sequentially  . History of hiatal hernia   . Arthritis   . S/P Maze operation for atrial fibrillation 06/30/2015    Complete bilateral atrial lesion set using bipolar radiofrequency and cryothermy ablation with clipping of LA appendage via conventional median sternotomy      Social History   Social History  . Marital Status: Married    Spouse Name: N/A  . Number of Children: 1  . Years of Education: N/A   Occupational History  . Courier    Social  History Main Topics  . Smoking status: Former Smoker -- 0.50 packs/day for 30 years    Types: Cigarettes    Quit date: 07/23/2006  . Smokeless tobacco: Never Used  . Alcohol Use: 4.2 - 8.4 oz/week    7-14 Standard drinks or equivalent per week     Comment: social  . Drug Use: No  . Sexual Activity: Not on file   Other Topics Concern  . Not on file   Social History Narrative   Pt works as a Secondary school teacher    Past Surgical History  Procedure Laterality Date  . Hand surgery    . Tonsillectomy    .  Wisdom tooth extraction    . Cholecystectomy  08/05/2012    Procedure: LAPAROSCOPIC CHOLECYSTECTOMY WITH INTRAOPERATIVE CHOLANGIOGRAM;  Surgeon: [name], MD,FACS;  Location: [place];  Service: General;  Laterality: N/A;  . Laparoscopic appendectomy N/A 02/13/2014    Procedure: APPENDECTOMY LAPAROSCOPIC;  Surgeon: [name], MD;  Location: [facility];  Service: General;  Laterality: N/A;  . Tee without cardioversion N/A 09/20/2014    Procedure: TRANSESOPHAGEAL ECHOCARDIOGRAM (TEE);  Surgeon: [name], MD;  Location: [place];  Service: Cardiovascular;  Laterality: N/A;  . Ablation  09/21/14    PVI by Dr [name]  . Atrial fibrillation ablation N/A 09/21/2014    Procedure: ATRIAL FIBRILLATION ABLATION;  Surgeon: [name], MD;  Location: [facility] CATH LAB;  Service: Cardiovascular;  Laterality: N/A;  . Tee without cardioversion N/A 01/10/2015    Procedure: TRANSESOPHAGEAL ECHOCARDIOGRAM (TEE);  Surgeon: Sanda Klein, MD;  Location: [facility] ENDOSCOPY;  Service: Cardiovascular;  Laterality: N/A;  . Electrophysiologic study N/A 01/11/2015    Procedure: Atrial Fibrillation Ablation;  Surgeon: [name], MD;  Location: [place] CV LAB;  Service: Cardiovascular;  Laterality: N/A;  . Cardiac catheterization N/A 06/20/2015    Procedure: Right/Left Heart Cath and Coronary Angiography;  Surgeon: Burnell Blanks, MD;  Location: [place] CV LAB;  Service: Cardiovascular;  Laterality: N/A;  . Coronary artery bypass graft N/A 06/30/2015    Procedure: CORONARY ARTERY BYPASS GRAFTING (CABG)X2 SEQ LIMA-DIAG-LAD;  Surgeon: Rexene Alberts, MD;  Location: [place];  Service: Open Heart Surgery;  Laterality: N/A;  . Tee without cardioversion N/A 06/30/2015    Procedure: TRANSESOPHAGEAL ECHOCARDIOGRAM (TEE);  Surgeon: Rexene Alberts, MD;  Location: [place];  Service: Open Heart Surgery;  Laterality: N/A;  . Maze N/A 06/30/2015    Procedure: MAZE;  Surgeon: Rexene Alberts, MD;  Location: [place];  Service:  Open Heart Surgery;  Laterality: N/A;    Family History  Problem Relation Age of Onset  . Adopted: Yes  . Other      ADOPTED    Allergies  Allergen Reactions  . Flecainide Shortness Of Breath and Other (See Comments)    EXTREME SOB  . Multaq [Dronedarone] Shortness Of Breath  . Penicillins Hives    Has patient had a PCN reaction causing immediate rash, facial/tongue/throat swelling, SOB or lightheadedness with hypotension:unsure Has patient had a PCN reaction causing severe rash involving mucus membranes or skin necrosis:unsure Has patient had a PCN reaction that required hospitalization:No Has patient had a PCN reaction occurring within the last 10 years:No If all of the above answers are "NO", then may proceed with Cephalosporin use.     Current Outpatient Prescriptions on File Prior to Visit  Medication Sig Dispense Refill  . apixaban (ELIQUIS) 5 MG TABS tablet Take 5 mg by mouth 2 (two) times  daily.    . aspirin EC 81 MG EC tablet Take 1 tablet (81 mg total) by mouth daily.    . citalopram (CELEXA) 10 MG tablet Take 1 tablet (10 mg total) by mouth daily. 90 tablet 1  . furosemide (LASIX) 40 MG tablet TAKE 1 TABLET (40 MG TOTAL) BY MOUTH DAILY. 30 tablet 6  . guaiFENesin 200 MG tablet Take 200 mg by mouth 2 (two) times daily as needed (for cold).    Marland Kitchen KLOR-CON M20 20 MEQ tablet TAKE 1 TABLET (20 MEQ TOTAL) BY MOUTH DAILY. 30 tablet 6  . lisinopril (PRINIVIL,ZESTRIL) 5 MG tablet Take 1 tablet (5 mg total) by mouth daily. 90 tablet 2  . metoprolol succinate (TOPROL-XL) 25 MG 24 hr tablet Take 1 tablet (25 mg total) by mouth 2 (two) times daily. 60 tablet 1  . simvastatin (ZOCOR) 20 MG tablet Take 1 tablet (20 mg total) by mouth daily at 6 PM. 90 tablet 2   No current facility-administered medications on file prior to visit.    BP 118/76 mmHg  Pulse 65  Temp(Src) 97.8 F (36.6 C) (Oral)  Ht 5\' 7"  (1.702 m)  Wt 282 lb 11.2 oz (128.232 kg)  BMI 44.27 kg/m2         Objective:   Physical Exam  Constitutional: He is oriented to person, place, and time. He appears well-developed and well-nourished.  Eyes: Pupils are equal, round, and reactive to light. No scleral icterus.  Neck: Neck supple.  Cardiovascular: Normal rate and regular rhythm.   Pulmonary/Chest: Effort normal and breath sounds normal. He has no wheezes. He has no rales.  Abdominal: Soft. Bowel sounds are normal. There is no tenderness.  Neurological: He is alert and oriented to person, place, and time.  Skin: Skin is warm and dry. No rash noted.  Psychiatric: He has a normal mood and affect. His behavior is normal. Judgment and thought content normal.      Assessment & Plan:  1. Gastroesophageal reflux disease without esophagitis Continue with nexium as directed. Advised avoidance of triggers for reflux. Recommended that consider weight loss and focus on avoidance of triggers of reflux and follow up with his PCP in 4 months. - esomeprazole (NEXIUM) 40 MG capsule; Take 1 capsule (40 mg total) by mouth daily at 12 noon.  Dispense: 90 capsule; Refill: 1  2. Anxiety Discussed with patient the importance of using this only once daily for sleep as directed. He reports doing this and will revisit discontinuation of this medication with his PCP at his next refill.  - clorazepate (TRANXENE) 7.5 MG tablet; Take 1 tablet (7.5 mg total) by mouth at bedtime.  Dispense: 30 tablet; Refill: 0  3. Insomnia Continue CPAP as directed by his pulmonologist and follow up as recommended. Also advised sleep hygiene practices with patient today.  - clorazepate (TRANXENE) 7.5 MG tablet; Take 1 tablet (7.5 mg total) by mouth at bedtime.  Dispense: 30 tablet; Refill: 0  Follow up with PCP as scheduled or earlier if needed.  Delano Metz, FNP-C

## 2016-02-23 NOTE — Patient Instructions (Signed)
Continue with current medication regimen. Follow up with Dr. Sherren Mocha as scheduled for your physical and lab work and future refills.  If you have any questions or concerns before Dr. Sherren Mocha returns, please contact clinic for further evaluation and follow up.

## 2016-02-23 NOTE — Progress Notes (Signed)
Pre visit review using our clinic review tool, if applicable. No additional management support is needed unless otherwise documented below in the visit note. 

## 2016-03-28 ENCOUNTER — Telehealth: Payer: Self-pay | Admitting: Family Medicine

## 2016-03-28 NOTE — Telephone Encounter (Signed)
Pt states he has suffered panic attacks for years and has a "service dog". (not officially certified) but goes everywhere with him. Pt is being transferred to [place] and pt needs a letter/form to be filled out allowing him to have his dog. Pt has to rent for a year and is concerned about pet restrictions in [place].  Pt hopes we have a form. But if not, advised pt to look on line. Also, pt is leaving the end of this month , and needs asap.

## 2016-03-28 NOTE — Telephone Encounter (Signed)
Called and spoke with pt, informing him that there is no form that Dr. Sherren Mocha Complete. Dr.Todd suggest he make and appointment with our in house psychiatrist. Pt verbalized understanding.

## 2016-04-26 ENCOUNTER — Other Ambulatory Visit: Payer: Self-pay | Admitting: Family Medicine

## 2016-05-21 ENCOUNTER — Telehealth: Payer: Self-pay | Admitting: Family Medicine

## 2016-05-21 ENCOUNTER — Other Ambulatory Visit: Payer: Self-pay | Admitting: Emergency Medicine

## 2016-05-21 MED ORDER — CHOLESTYRAMINE 4 G PO PACK: 4.0000 g | PACK | Freq: Two times a day (BID) | ORAL | 2 refills | Status: DC

## 2016-05-21 NOTE — Telephone Encounter (Signed)
Patient called to advise that he has moved to [facility]. States that he is in transition to a new pcp, and needs cholestyramine (QUESTRAN) packet 4 g filled in the meantime.   He is using : Address: [address] [place] [address] Departments[facility] Hours: Open today  8AM-10PM  See more hours Phone: [phone]

## 2016-05-21 NOTE — Telephone Encounter (Signed)
Spoke with pt. Pt states that [facility]. Informed pt that prescription had been filled. Pt verbalized understanding.

## 2016-06-26 ENCOUNTER — Other Ambulatory Visit: Payer: Managed Care, Other (non HMO)

## 2016-07-02 ENCOUNTER — Encounter: Payer: Managed Care, Other (non HMO) | Admitting: Family Medicine

## 2016-07-02 ENCOUNTER — Telehealth (HOSPITAL_COMMUNITY): Payer: Self-pay | Admitting: *Deleted

## 2016-07-02 NOTE — Telephone Encounter (Signed)
Patient called in this morning stating he has relocated to [place]. Wanted to see if [name] had any recommendations of cardiologist to establish with in this area? Also questioning whether he can stop Eliquis. There was discussion at his last office visit with Dr. Roxy Manns but the decision was to be deferred to [name]. He states he has had only a few episodes of afib since his surgery but most only lasted less than 1 hour. Will forward to [name] for review and await recommendations. Best number for contact is [phone].

## 2016-07-03 ENCOUNTER — Encounter: Payer: Managed Care, Other (non HMO) | Admitting: Family Medicine

## 2016-07-03 NOTE — Telephone Encounter (Signed)
Discussed with [name]. He would prefer patient stay on eliquis long term if tolerating. He did not have a contact in [place] but did encourage be established soon for future issues. Left message with recommendations.

## 2016-07-04 ENCOUNTER — Telehealth: Payer: Self-pay

## 2016-07-04 NOTE — Telephone Encounter (Signed)
Prior auth for Eliquis 5mg  obtained from [facility]. Case ID # [id number] is valid through 07/04/2017.

## 2016-07-25 ENCOUNTER — Other Ambulatory Visit: Payer: Self-pay | Admitting: Internal Medicine

## 2016-07-30 ENCOUNTER — Encounter: Payer: Managed Care, Other (non HMO) | Admitting: Thoracic Surgery (Cardiothoracic Vascular Surgery)

## 2016-08-06 ENCOUNTER — Other Ambulatory Visit: Payer: Self-pay | Admitting: Family Medicine

## 2016-08-26 ENCOUNTER — Other Ambulatory Visit: Payer: Self-pay | Admitting: Internal Medicine

## 2016-08-29 ENCOUNTER — Telehealth (HOSPITAL_COMMUNITY): Payer: Self-pay | Admitting: *Deleted

## 2016-08-29 NOTE — Telephone Encounter (Signed)
Patient called stating he has no insurance for the next 30 days and is wanting samples of eliquis. Pt unfortantely has not established any type of medical care in [place] despite recommendations of this several months ago. Discussed with patient importance of having local cardiology and primary care. We cannot mail samples to pt therefore pt was informed he would have to buy eliquis at cost. Told pt to call if insurance gap would be more than 30 days and we will attempt patient assistance through eliquis. Pt verbalized understanding and will establish care.

## 2016-08-31 IMAGING — DX DG CHEST 1V PORT
1 series · 1 of 1 positions shown · non-contrast
Comparison: 06/28/2015 chest radiograph.

CLINICAL DATA: Status post 2 vessel CABG

EXAM:
PORTABLE CHEST 1 VIEW

[chest ap]
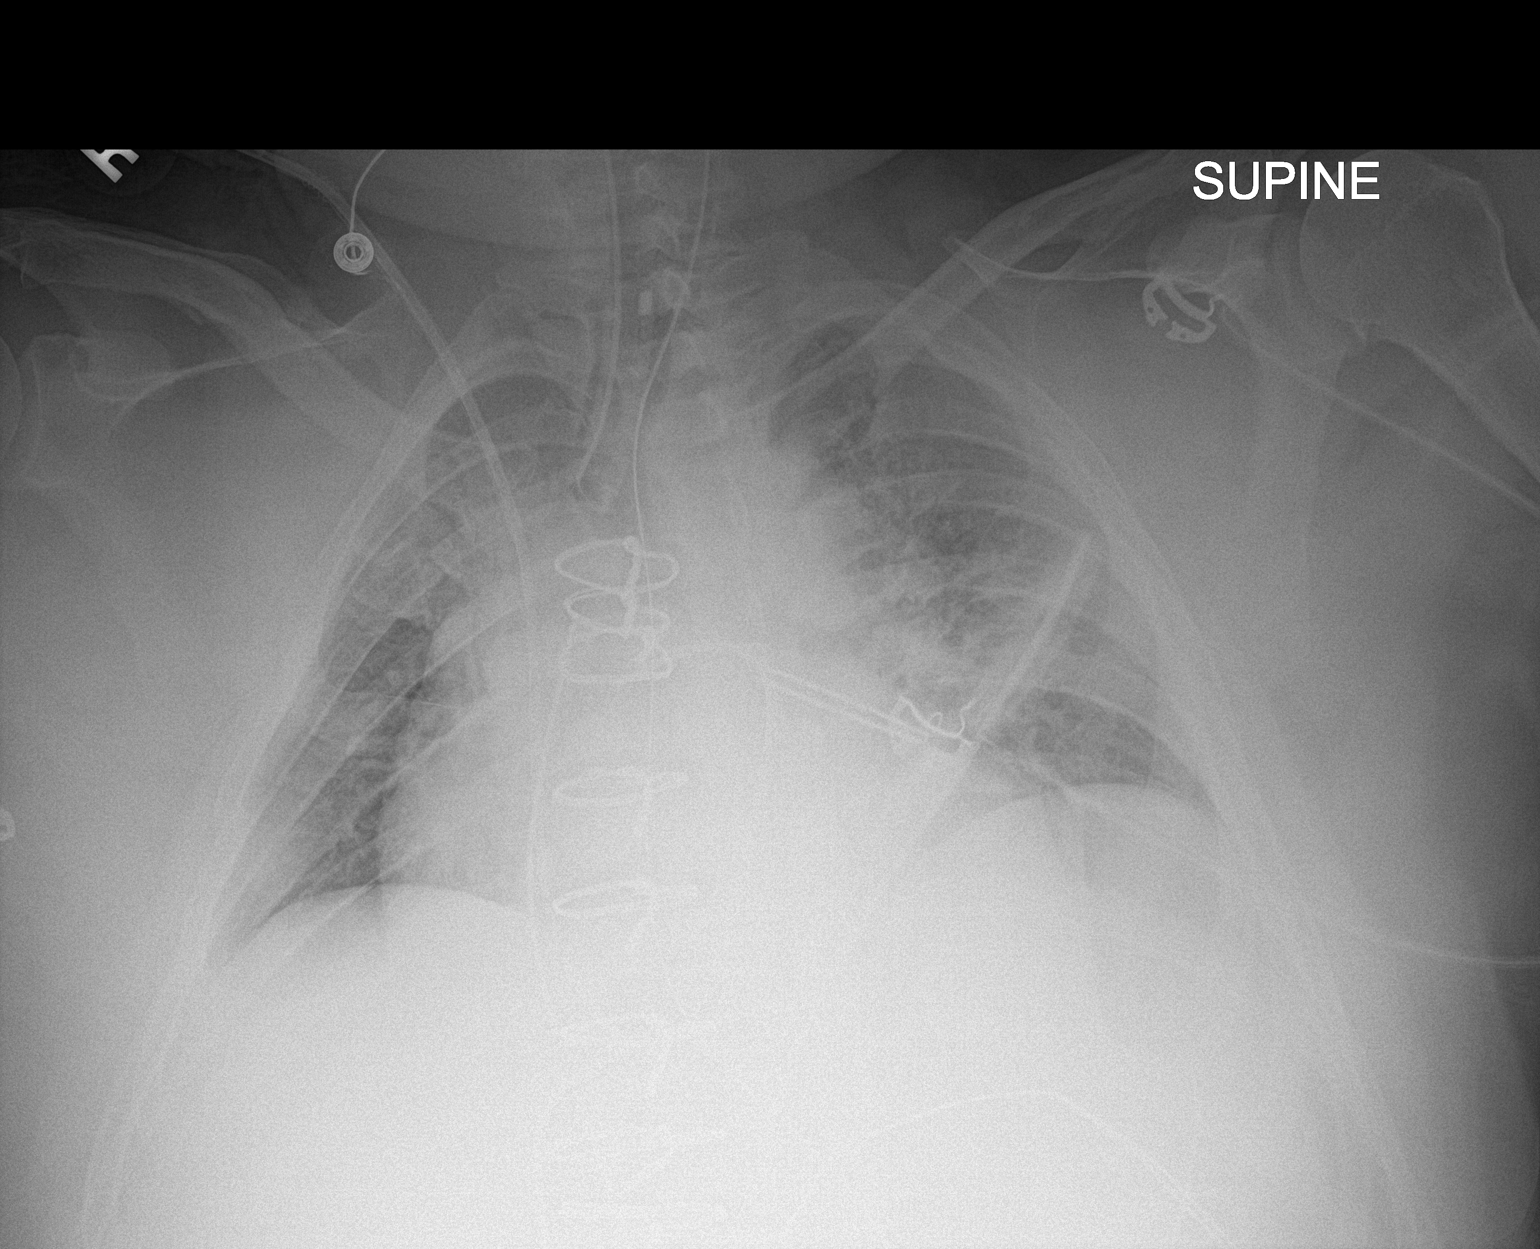

[1 of 1 positions shown; findings below may reference images not displayed]

FINDINGS: Endotracheal tube tip is 3.4 cm above the carina. Right internal
jugular Swan-Ganz catheter terminates over the proximal right
pulmonary artery. Median sternotomy wires appear aligned and intact.
Enteric tube enters the stomach with the tip not seen on this image.
Left chest tube and bilateral mediastinal drains are in place. Low
lung volumes. Slightly right rotated chest radiograph
cardiomediastinal silhouette is within expected postoperative limits
with mild cardiomegaly. No pneumothorax. No pleural effusion.
Vascular crowding without overt pulmonary edema. Patchy atelectasis
in the left mid lung.
IMPRESSION: 1. Well-positioned lines and tubes.
2. Low lung volumes with left mid lung atelectasis.
3. Mild cardiomegaly without overt pulmonary edema.

## 2016-09-01 IMAGING — CR DG CHEST 1V PORT
1 series · 1 of 1 positions shown · non-contrast
Comparison: Portable chest x-ray June 30, 2015.

CLINICAL DATA: Status post CABG and Maze procedure on June 30, 2015

EXAM:
PORTABLE CHEST 1 VIEW

[AP]
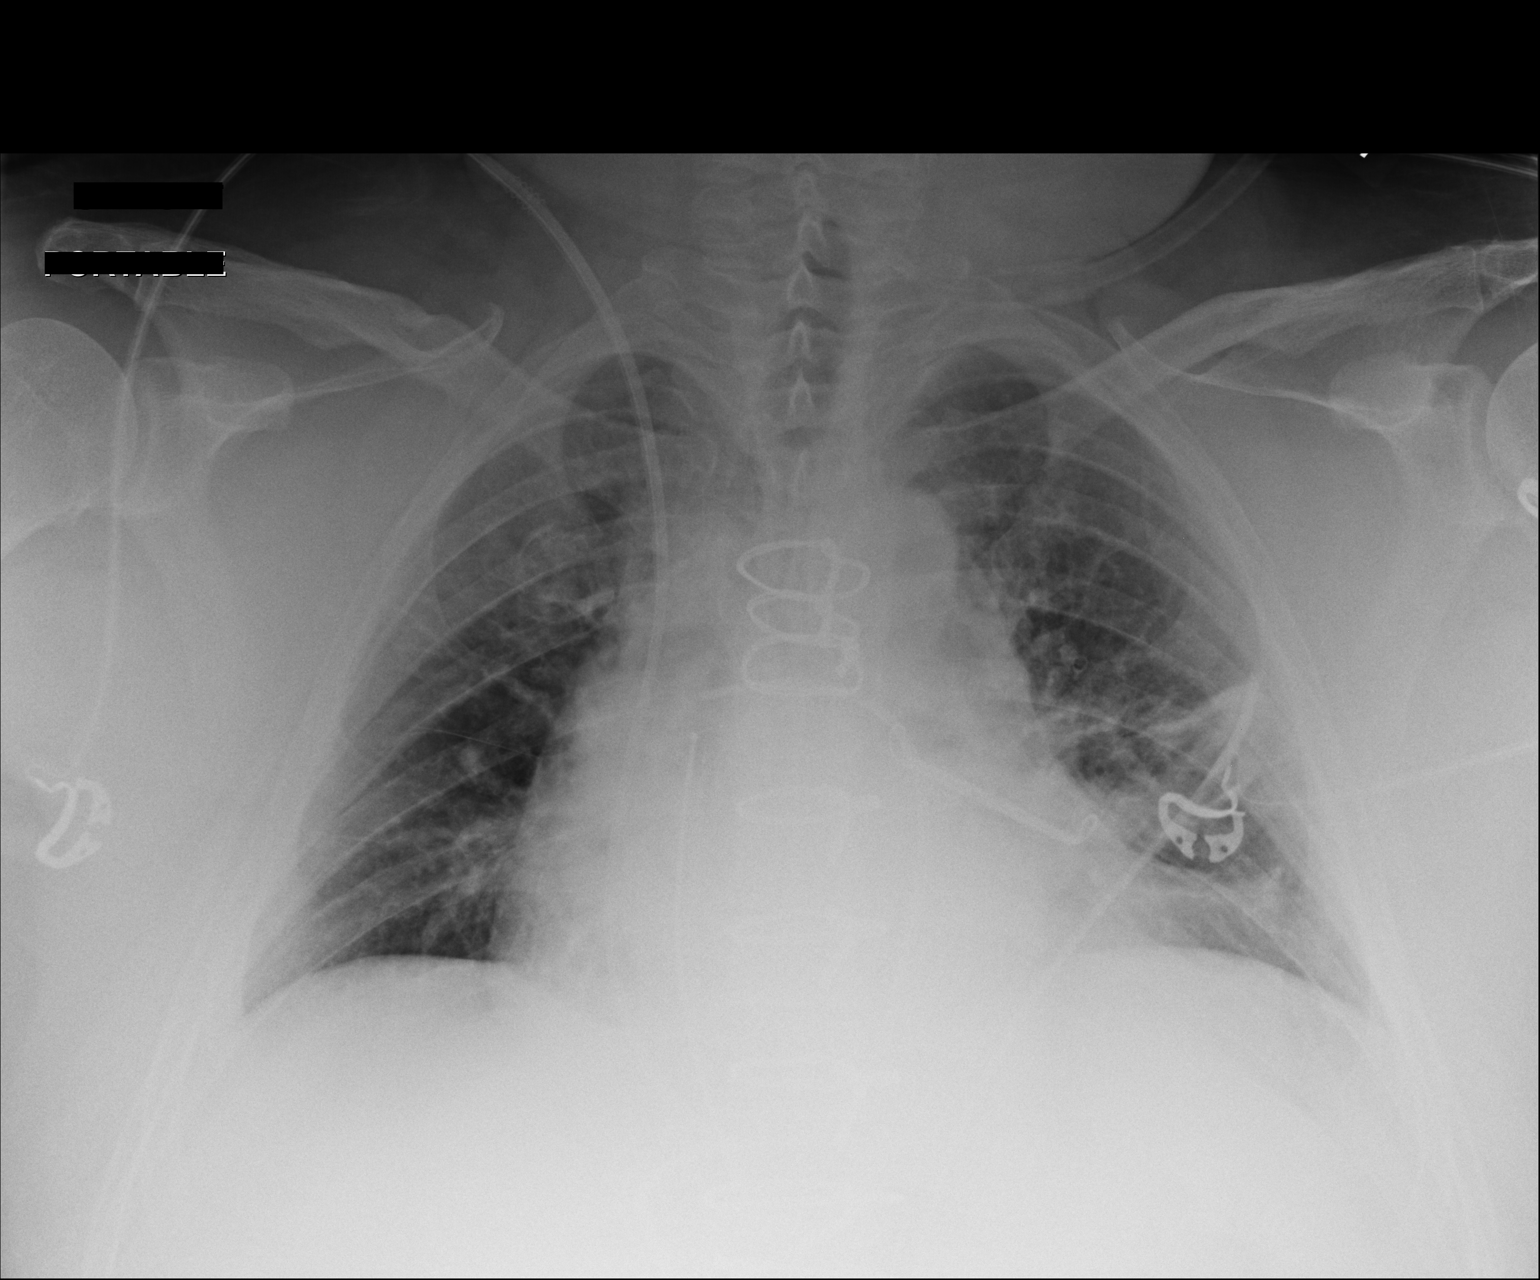

[1 of 1 positions shown; findings below may reference images not displayed]

FINDINGS: The lungs are better inflated today. The interstitial markings are
less conspicuous. There is persistent subsegmental atelectasis on
the left. There is no pneumothorax or pleural effusion. The cardiac
silhouette remains enlarged but its margins are more distinct. The
pulmonary vascularity is less engorged.

There has been interval extubation of the trachea and esophagus. The
Swan-Ganz catheter tip projects over the proximal right main
pulmonary artery. The left-sided chest tube and the right-sided
mediastinal drain are unchanged in position. A left atrial appendage
clip is demonstrated. There are 7 intact sternal wires.
IMPRESSION: Improving pulmonary interstitial edema and decreasing left basilar
atelectasis. Decreased pulmonary vascular congestion. Stable mild
cardiomegaly. There is no pleural effusion or pneumothorax. The
remaining support tubes are in reasonable position.

## 2016-09-02 IMAGING — CR DG CHEST 1V PORT
1 series · 1 of 1 positions shown · non-contrast
Comparison: 07/01/2015

CLINICAL DATA: Atelectasis

EXAM:
PORTABLE CHEST 1 VIEW

[AP]
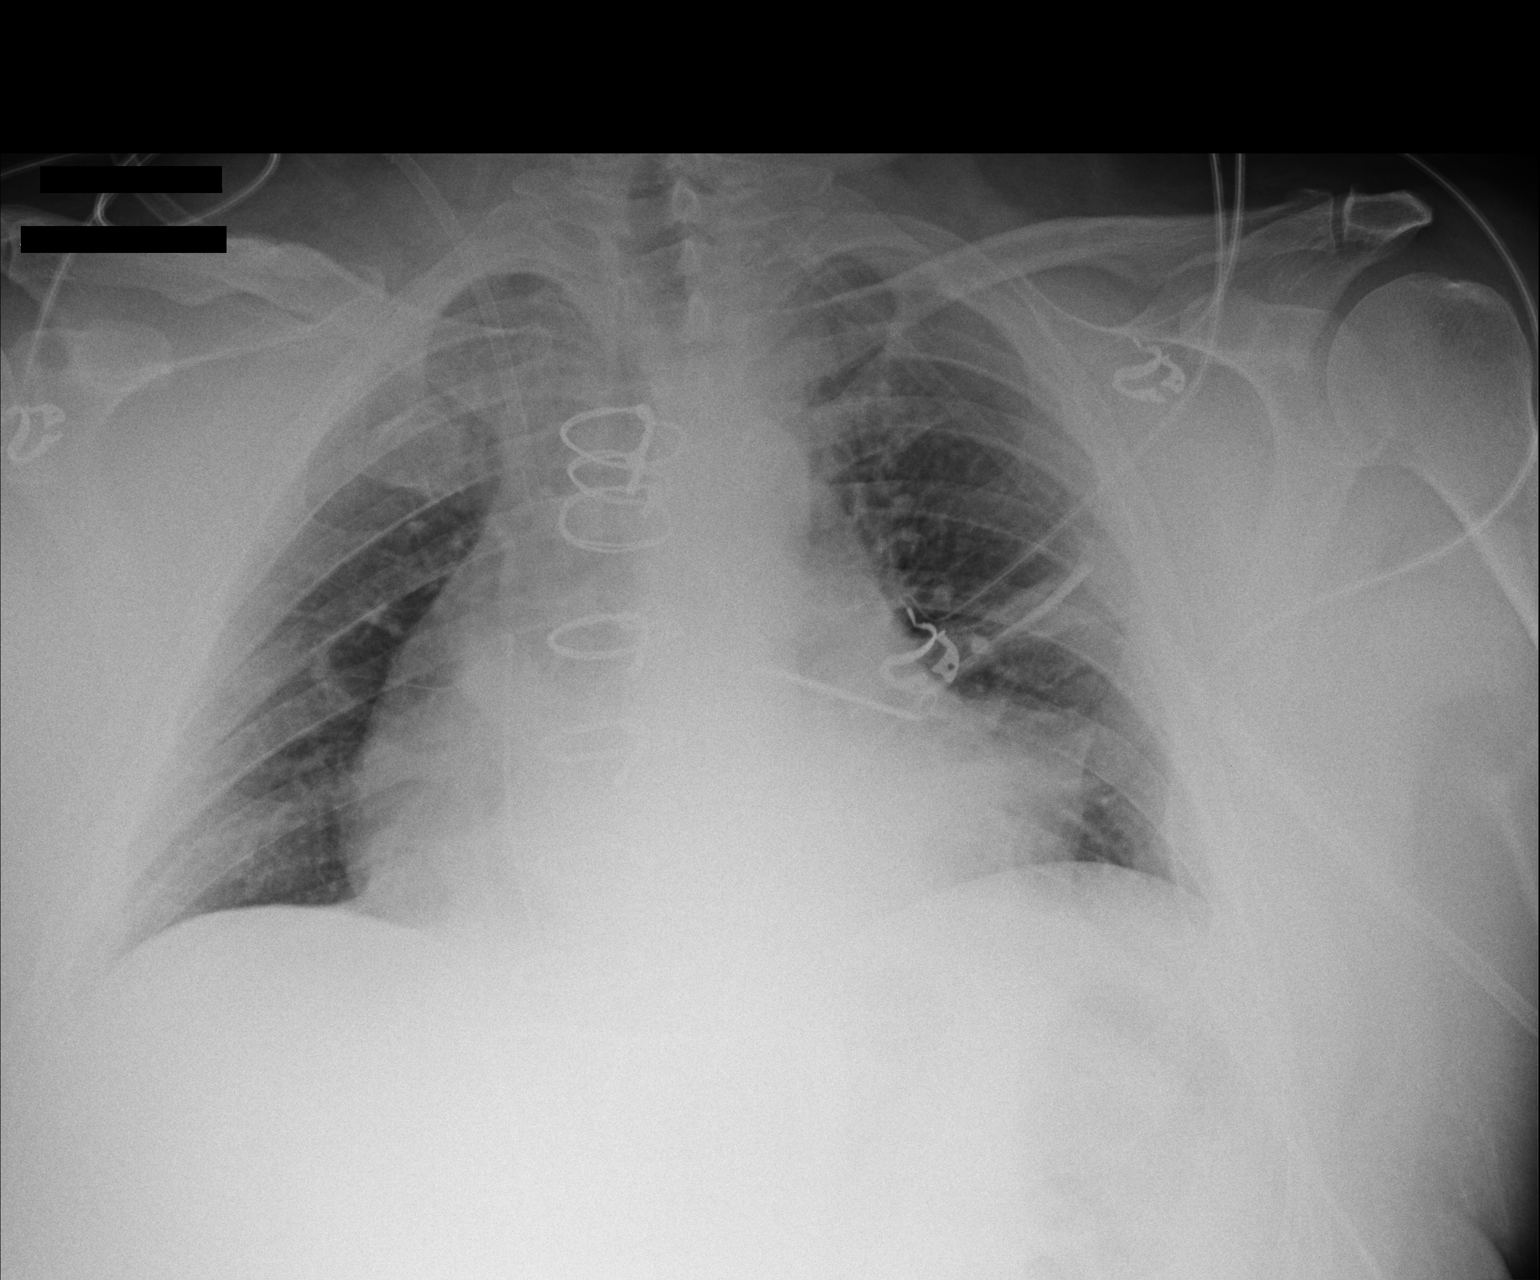

[1 of 1 positions shown; findings below may reference images not displayed]

FINDINGS: Lingular atelectasis/scarring. No focal consolidation. Low lung
volumes. No pleural effusion or pneumothorax.

Median sternotomy.

Interval removal of right IJ Swan-Ganz catheter with stable venous
sheath.

Left chest tube and mediastinal drain.
IMPRESSION: Left chest tube and mediastinal drain.  No pneumothorax.

Lingular atelectasis/scarring.

## 2016-09-03 IMAGING — CR DG CHEST 2V
2 series · 2 of 2 positions shown · non-contrast
Comparison: 07/02/2015 and prior radiographs

CLINICAL DATA: 54-year-old male status post CABG.

EXAM:
CHEST  2 VIEW

[chest pa]
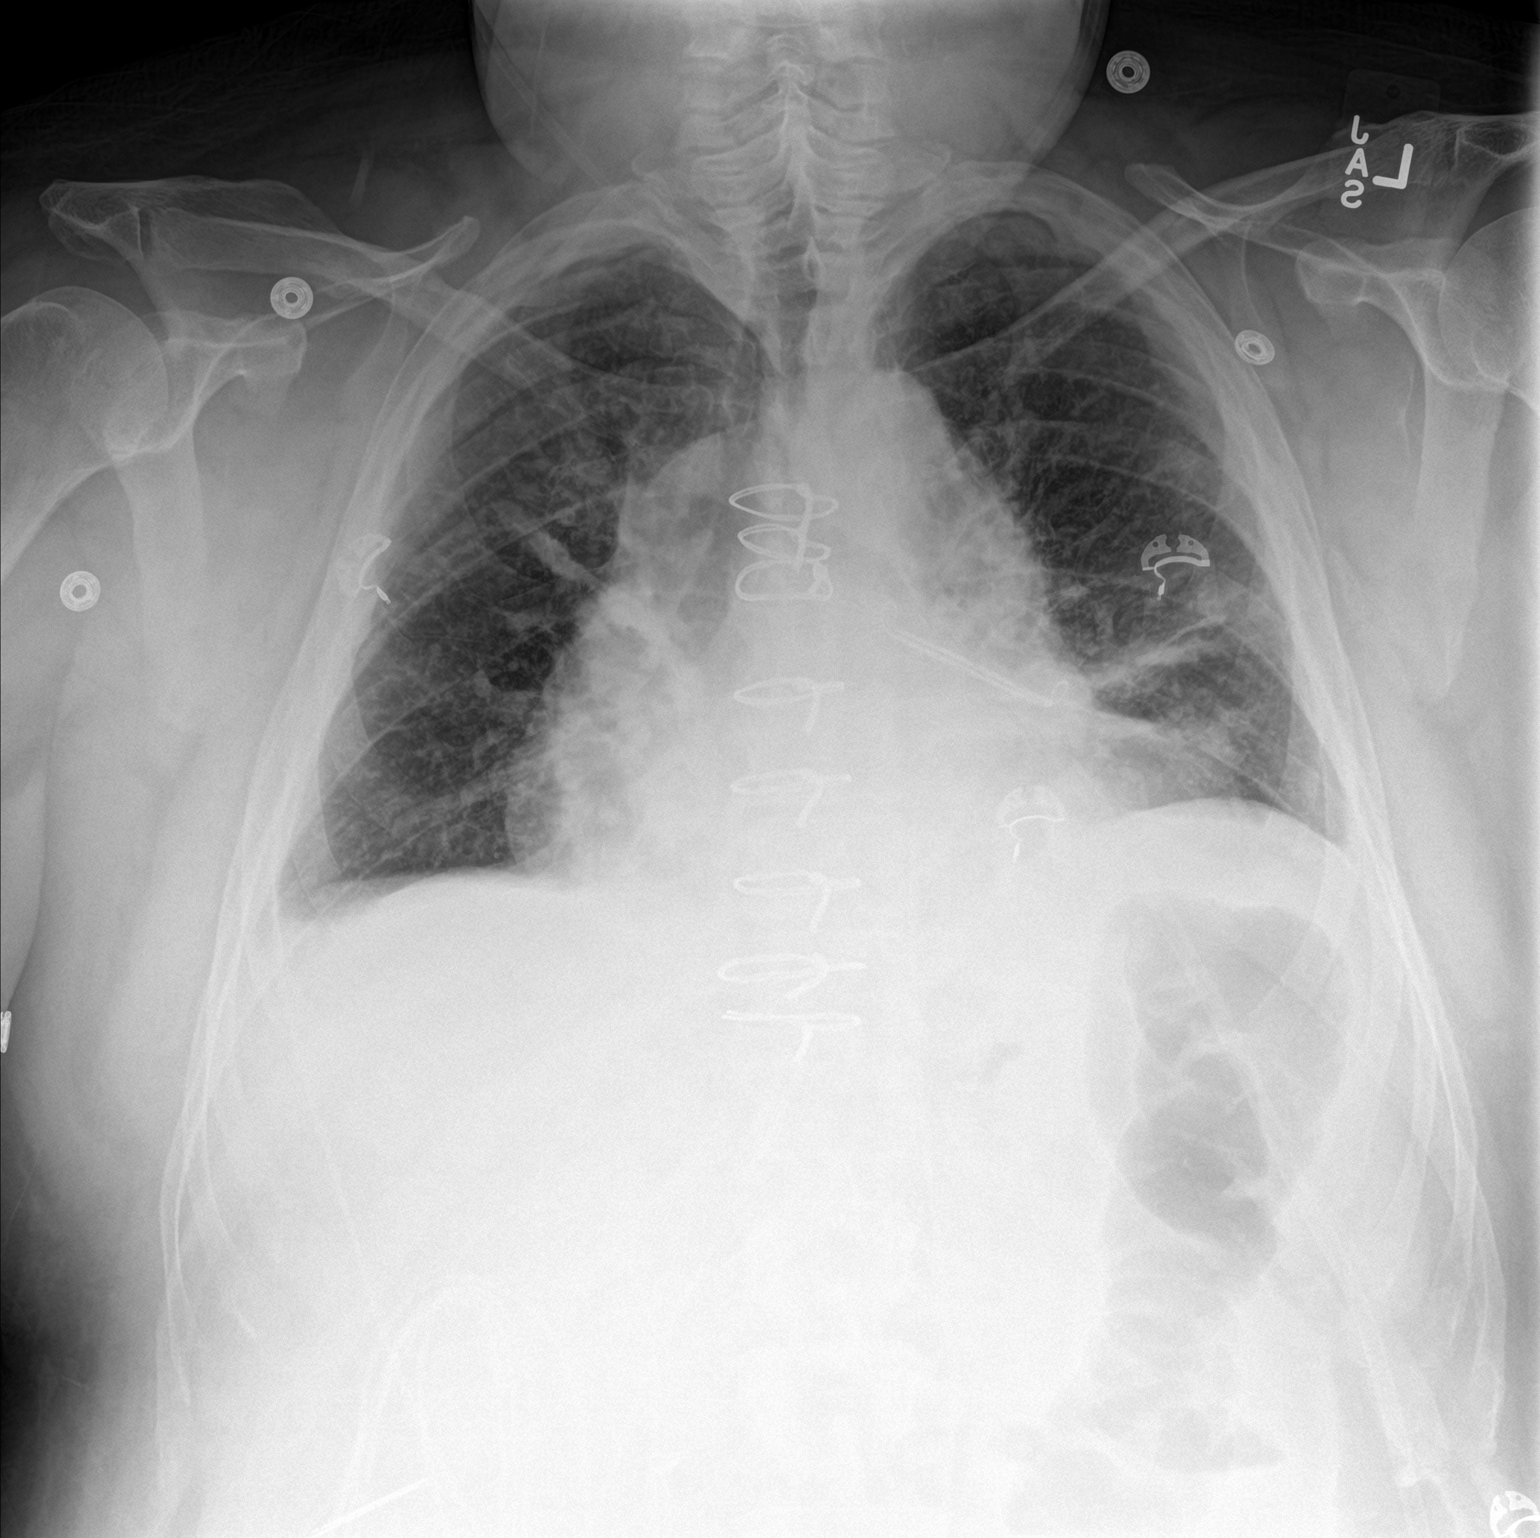

[chest lat]
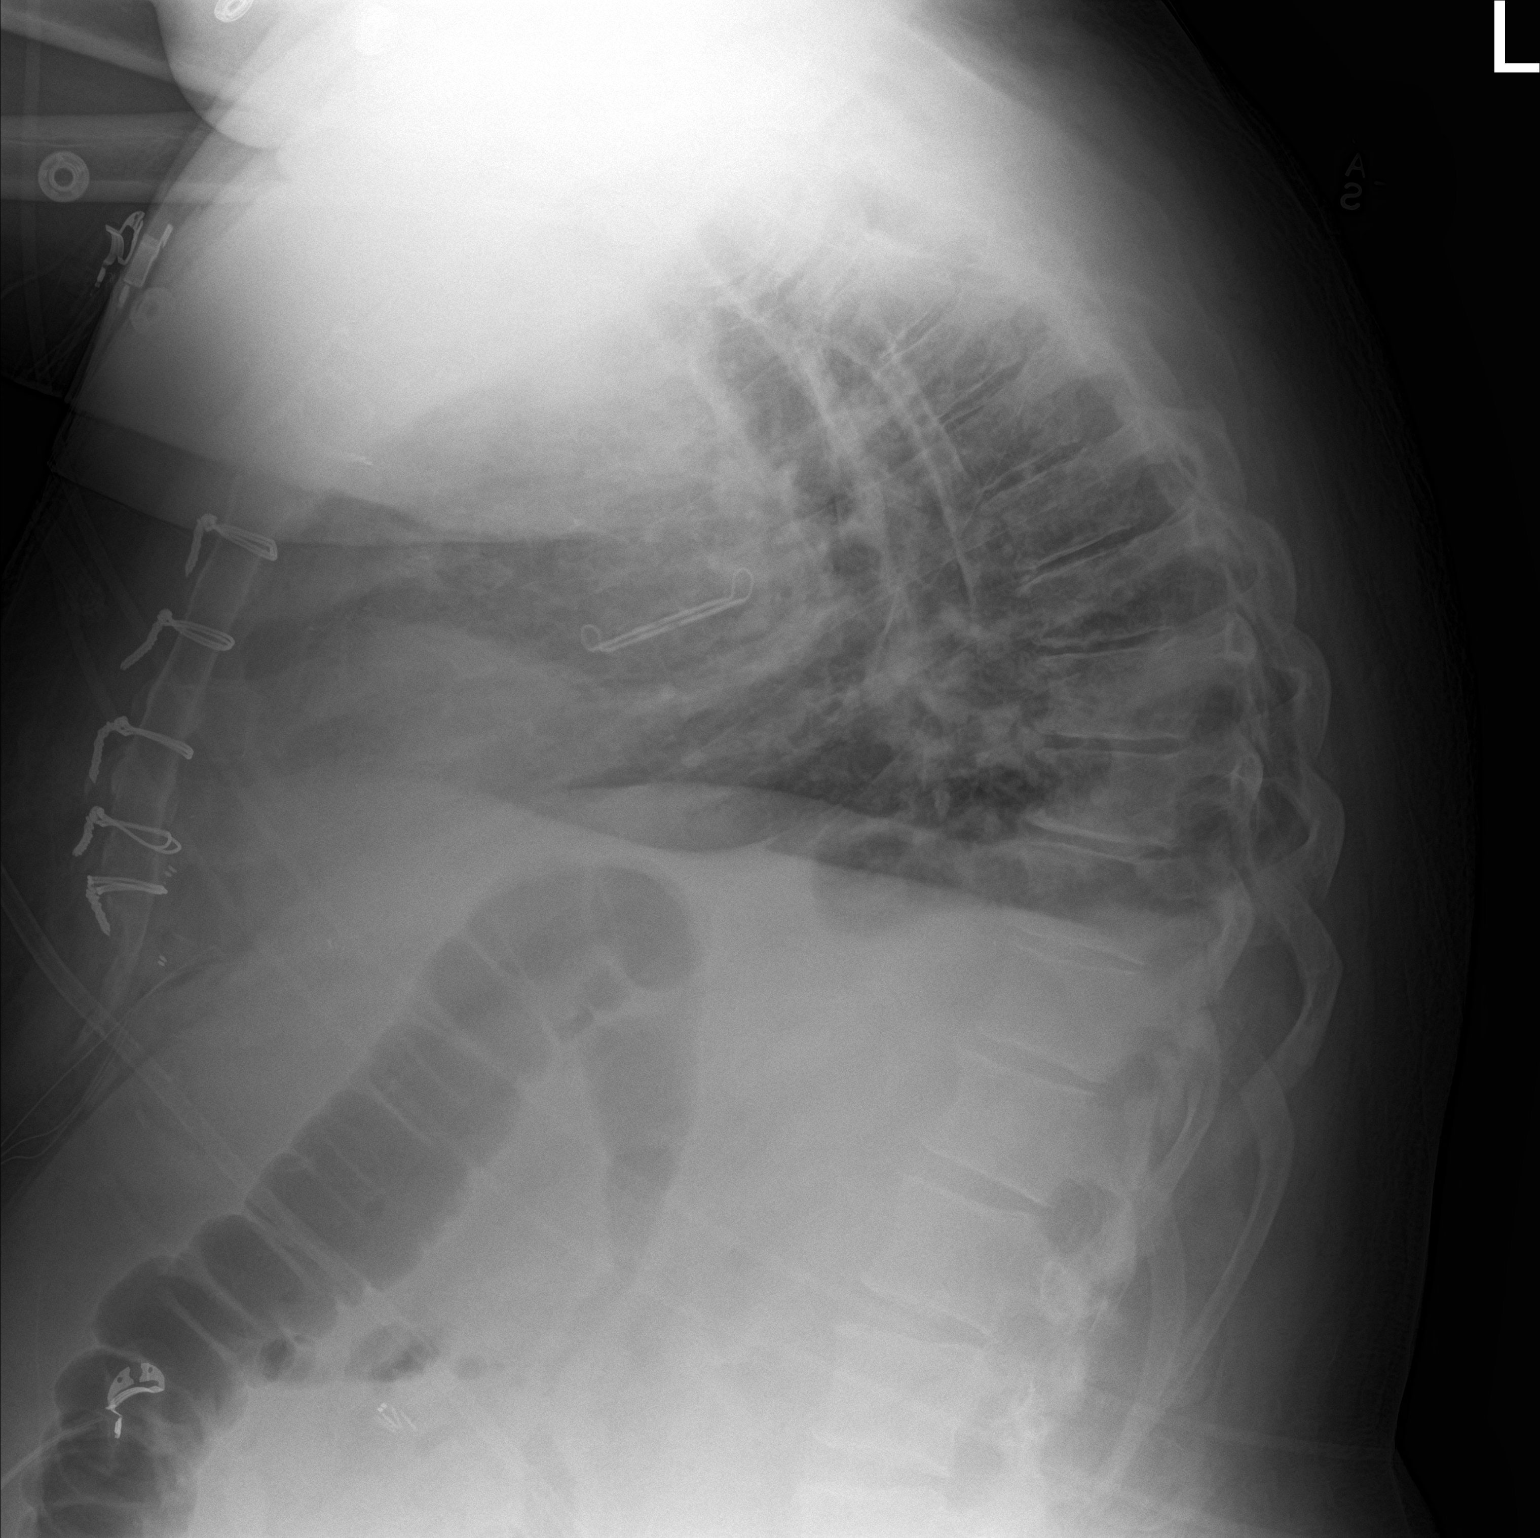

[2 of 2 positions shown; findings below may reference images not displayed]

FINDINGS: Cardiomegaly, median sternotomy and left atrial appendage clip
noted.

Subsegmental mid and lower lung atelectasis bilaterally again noted.

There is no evidence of pneumothorax.

There may be trace pleural effusions present.

A right IJ central venous catheter sheath has been removed.
IMPRESSION: Right IJ central venous catheter sheath removal without other
significant change. Continued subsegmental and bibasilar
atelectasis.

## 2016-10-30 ENCOUNTER — Other Ambulatory Visit: Payer: Self-pay | Admitting: Family Medicine

## 2016-10-30 DIAGNOSIS — K219 Gastro-esophageal reflux disease without esophagitis: Secondary | ICD-10-CM

## 2016-10-31 NOTE — Telephone Encounter (Signed)
Sent to the pharmacy by e-scribe for 90 days. 

## 2017-02-04 ENCOUNTER — Ambulatory Visit (INDEPENDENT_AMBULATORY_CARE_PROVIDER_SITE_OTHER): Payer: Managed Care, Other (non HMO) | Admitting: Physician Assistant

## 2017-02-04 ENCOUNTER — Encounter: Payer: Self-pay | Admitting: Physician Assistant

## 2017-02-04 VITALS — BP 150/82 | HR 64 | Ht 68.0 in | Wt 293.8 lb

## 2017-02-04 DIAGNOSIS — I481 Persistent atrial fibrillation: Secondary | ICD-10-CM

## 2017-02-04 DIAGNOSIS — I5032 Chronic diastolic (congestive) heart failure: Secondary | ICD-10-CM

## 2017-02-04 DIAGNOSIS — R0602 Shortness of breath: Secondary | ICD-10-CM | POA: Diagnosis not present

## 2017-02-04 DIAGNOSIS — I2510 Atherosclerotic heart disease of native coronary artery without angina pectoris: Secondary | ICD-10-CM | POA: Diagnosis not present

## 2017-02-04 DIAGNOSIS — E785 Hyperlipidemia, unspecified: Secondary | ICD-10-CM | POA: Diagnosis not present

## 2017-02-04 DIAGNOSIS — I10 Essential (primary) hypertension: Secondary | ICD-10-CM

## 2017-02-04 DIAGNOSIS — I4819 Other persistent atrial fibrillation: Secondary | ICD-10-CM

## 2017-02-04 DIAGNOSIS — F419 Anxiety disorder, unspecified: Secondary | ICD-10-CM

## 2017-02-04 MED ORDER — CLORAZEPATE DIPOTASSIUM 7.5 MG PO TABS: 7.5000 mg | ORAL_TABLET | Freq: Every day | ORAL | 1 refills | Status: DC

## 2017-02-04 MED ORDER — LISINOPRIL 20 MG PO TABS: 20.0000 mg | ORAL_TABLET | Freq: Two times a day (BID) | ORAL | 3 refills | Status: DC

## 2017-02-04 MED ORDER — CITALOPRAM HYDROBROMIDE 10 MG PO TABS: ORAL_TABLET | ORAL | 1 refills | Status: DC

## 2017-02-04 NOTE — Patient Instructions (Signed)
Medication Instructions:  1. REFILLS HAVE BEE SENT IN FOR CELEXA AND TRANXENE  2. INCREASE LISINOPRIL TO 20 MG TWICE DAILY; NEW RX HAS BEEN SENT IN   Labwork: 1. TODAY BMET, CBC, TSH, LFT, PRO BNP  2. YOU HAVE BEEN GIVEN AN RX FOR LAB WORK TO BE DONE IN 2 WEEKS (BMET); PLEASE HAVE THE RESULTS FAXED TO [place], [place]  Testing/Procedures: NONE ORDRED  Follow-Up: [name] IN 6-8 WEEKS   Any Other Special Instructions Will Be Listed Below (If Applicable).  YOU HAVE A SIGNED A MEDICAL RELEASE FORM TODAY FOR OUR OFFICE TO BE ABLE TO OBTAIN RECORDS FROM [facility]   If you need a refill on your cardiac medications before your next appointment, please call your pharmacy.

## 2017-02-04 NOTE — Progress Notes (Signed)
Cardiology Office Note:    Date:  02/04/2017   ID:  [name], DOB 16-Jul-1961, MRN [id number]  PCP:  Dorena Cookey, MD  Cardiologist:  Dr. [name]    Referring MD: Dorena Cookey, MD   Chief Complaint  Patient presents with  . Atrial Fibrillation    Follow-up    History of Present Illness:    Harold Scott is a 56 y.o. male with a hx of CAD, symptomatic persistent AF, HTN, diastolic HF, OSA on CPAP, anxiety/depression.  He has undergone catheter-based ablation for his atrial fibrillation x 2 in the past b/c it has been refractory to medical Rx (failing Flecainide, Multaq, Dofetilide, Amiodarone).  In 06/2015, he underwent CABG x 2 and Maze procedure with clipping of the LAA.  He had some post-op AFlutter But converted back to normal sinus rhythm. He last saw [name] in 08/2015.  He was seen in the atrial fibrillation clinic in 10/2015. His amiodarone was discontinued at that time. Review of his chart indicates that [name] has recommended that the patient remain on long-term anticoagulation with Eliquis.  Harold Scott returns for follow-up. Since last seen, he moved to [place]. While there he was followed by cardiologist. He had what sounds like an echocardiogram, nuclear stress test and chest CT about 3 months ago. He has recently moved back to [place]. He currently lives in [place]. The patient notes stable symptoms of dyspnea with exertion since prior to his surgery without significant change. He denies orthopnea. He sleeps with CPAP. He denies significant LE edema. He denies chest discomfort or syncope. He denies any symptoms of atrial fibrillation. He denies any bleeding issues.  Prior CV studies:   The following studies were reviewed today:  TEE (intraoperative) 06/30/15 EF 50-55%, mild to moderate MR, no LAA clot   Carotid US 06/28/15 Bilateral ICA 1-39%   LHC 06/20/15 LAD: prox 30%, mid 50%, D1 99%, D2 60% LCx: mid 30%, OM1 20% RCA:  Mid 30% 1.  Severe stenosis moderate caliber Diagonal branch 2. Moderate stenosis mid LAD 3. Mild stenosis RCA 4. Mild stenosis Circumflex/OM 5. Elevated filling pressures Recommendations: Will review images with Dr. Roxy Manns. He is planning a MAZE procedure. The Diagonal lesion could be easily treated with PCI/stenting but timing will be based around timing of surgical procedure. Will start ASA 81 mg daily. Resume Eliquis tomorrow. D/C home today.   Echo 12/22/14 Severe focal basal hypertrophy, vigorous LVF, EF 65-70%, normal wall motion, trivial MR, mild LAE   Myoview 07/20/14 Mild basal inferolateral thinning consistent with probable soft tissue attenuation, cannot r/o subendocardial scar. No significant ischemia  LVEF 51% with inferior hypokinesis.  Overall low risk scan   Past Medical History:  Diagnosis Date  . Anxiety   . Arthritis   . Coronary artery disease    a. s/p CABG 06/2015 LIMA to D1 and distal LAD, sequentially  . Depression   . Diastolic CHF, chronic ([place])   . Essential hypertension   . Gallstones   . GERD (gastroesophageal reflux disease)   . History of hiatal hernia   . Hyperlipidemia   . Mitral regurgitation   . Morbid obesity ([place])   . Obstructive sleep apnea    a. on CPAP  . Paroxysmal atrial fibrillation (HCC)    a.  previously intolerant of Flecainide and Multaq b. PVI by Dr [name] 09/21/14 c. s/p MAZE Dr Roxy Manns 06/2015  . PONV (postoperative nausea and vomiting)   . S/P  Maze operation for atrial fibrillation 06/30/2015   Complete bilateral atrial lesion set using bipolar radiofrequency and cryothermy ablation with clipping of LA appendage via conventional median sternotomy     Past Surgical History:  Procedure Laterality Date  . ABLATION  09/21/14   PVI by Dr [name]  . ATRIAL FIBRILLATION ABLATION N/A 09/21/2014   Procedure: ATRIAL FIBRILLATION ABLATION;  Surgeon: [name], MD;  Location: [facility] CATH LAB;  Service: Cardiovascular;  Laterality: N/A;  . CARDIAC  CATHETERIZATION N/A 06/20/2015   Procedure: Right/Left Heart Cath and Coronary Angiography;  Surgeon: Burnell Blanks, MD;  Location: [facility] CV LAB;  Service: Cardiovascular;  Laterality: N/A;  . CHOLECYSTECTOMY  08/05/2012   Procedure: LAPAROSCOPIC CHOLECYSTECTOMY WITH INTRAOPERATIVE CHOLANGIOGRAM;  Surgeon: [name], MD,FACS;  Location: [facility];  Service: General;  Laterality: N/A;  . CORONARY ARTERY BYPASS GRAFT N/A 06/30/2015   Procedure: CORONARY ARTERY BYPASS GRAFTING (CABG)X2 SEQ LIMA-DIAG-LAD;  Surgeon: Rexene Alberts, MD;  Location: [facility];  Service: Open Heart Surgery;  Laterality: N/A;  . ELECTROPHYSIOLOGIC STUDY N/A 01/11/2015   Procedure: Atrial Fibrillation Ablation;  Surgeon: [name], MD;  Location: [facility] CV LAB;  Service: Cardiovascular;  Laterality: N/A;  . HAND SURGERY    . LAPAROSCOPIC APPENDECTOMY N/A 02/13/2014   Procedure: APPENDECTOMY LAPAROSCOPIC;  Surgeon: [name], MD;  Location: [facility];  Service: General;  Laterality: N/A;  . MAZE N/A 06/30/2015   Procedure: MAZE;  Surgeon: Rexene Alberts, MD;  Location: [facility];  Service: Open Heart Surgery;  Laterality: N/A;  . TEE WITHOUT CARDIOVERSION N/A 09/20/2014   Procedure: TRANSESOPHAGEAL ECHOCARDIOGRAM (TEE);  Surgeon: [name], MD;  Location: [facility];  Service: Cardiovascular;  Laterality: N/A;  . TEE WITHOUT CARDIOVERSION N/A 01/10/2015   Procedure: TRANSESOPHAGEAL ECHOCARDIOGRAM (TEE);  Surgeon: Sanda Klein, MD;  Location: [facility] ENDOSCOPY;  Service: Cardiovascular;  Laterality: N/A;  . TEE WITHOUT CARDIOVERSION N/A 06/30/2015   Procedure: TRANSESOPHAGEAL ECHOCARDIOGRAM (TEE);  Surgeon: Rexene Alberts, MD;  Location: [facility];  Service: Open Heart Surgery;  Laterality: N/A;  . TONSILLECTOMY    . WISDOM TOOTH EXTRACTION      Current Medications: Current Meds  Medication Sig  . aspirin EC 81 MG EC tablet Take 1 tablet (81 mg total) by mouth daily.  . carvedilol (COREG) 12.5 MG  tablet TAKE 1 TABLET(S) TWICE A DAY BY ORAL ROUTE WITH MEALS FOR 60 DAYS.  Marland Kitchen cholestyramine (QUESTRAN) 4 g packet TAKE 1 PACKET MIX AND DRINK TWICE A DAY  . ELIQUIS 5 MG TABS tablet TAKE 1 TABLET BY MOUTH TWICE A DAY  . esomeprazole (NEXIUM) 40 MG capsule TAKE 1 CAPSULE DAILY AT 12 NOON  . furosemide (LASIX) 40 MG tablet TAKE 1 TABLET (40 MG TOTAL) BY MOUTH DAILY.  Marland Kitchen guaiFENesin 200 MG tablet Take 200 mg by mouth 2 (two) times daily as needed (for cold).  Marland Kitchen KLOR-CON M20 20 MEQ tablet TAKE 1 TABLET (20 MEQ TOTAL) BY MOUTH DAILY.  . simvastatin (ZOCOR) 20 MG tablet Take 1 tablet (20 mg total) by mouth daily at 6 PM.  . [DISCONTINUED] lisinopril (PRINIVIL,ZESTRIL) 20 MG tablet Take 20 mg by mouth daily.     Allergies:   Flecainide; Multaq [dronedarone]; and Penicillins   Social History   Social History  . Marital status: Married    Spouse name: N/A  . Number of children: 1  . Years of education: N/A   Occupational History  . [employer]  History Main Topics  . Smoking status: Former Smoker    Packs/day: 0.50    Years: 30.00    Types: Cigarettes    Quit date: 07/23/2006  . Smokeless tobacco: Never Used  . Alcohol use 4.2 - 8.4 oz/week    7 - 14 Standard drinks or equivalent per week     Comment: social  . Drug use: No  . Sexual activity: Not Asked   Other Topics Concern  . None   Social History Narrative   Pt works as a Secondary school teacher     Family Hx: The patient's family history is not on file. He was adopted.  ROS:   Please see the history of present illness.    ROS All other systems reviewed and are negative.   EKGs/Labs/Other Test Reviewed:    EKG:  EKG is  ordered today.  The ekg ordered today demonstrates NSR, HR 64, normal axis, QTC 480 ms, no change from prior tracings  Recent Labs: No results found for requested labs within last 8760 hours.   Recent Lipid Panel Lab Results  Component Value Date/Time   CHOL 228 (H) 07/19/2014 04:25 AM   TRIG 338  (H) 07/19/2014 04:25 AM   HDL 30 (L) 07/19/2014 04:25 AM   CHOLHDL 7.6 07/19/2014 04:25 AM   LDLCALC 130 (H) 07/19/2014 04:25 AM    Physical Exam:    VS:  BP (!) 150/82   Pulse 64   Ht 5\' 8"  (1.727 m)   Wt 293 lb 12.8 oz (133.3 kg)   BMI 44.67 kg/m     Wt Readings from Last 3 Encounters:  02/04/17 293 lb 12.8 oz (133.3 kg)  02/23/16 282 lb 11.2 oz (128.2 kg)  01/23/16 268 lb (121.6 kg)     Physical Exam  Constitutional: He is oriented to person, place, and time. He appears well-developed and well-nourished. No distress.  HENT:  Head: Normocephalic and atraumatic.  Eyes: No scleral icterus.  Neck: Normal range of motion. No JVD present.  Cardiovascular: Normal rate, regular rhythm, S1 normal and S2 normal.   No murmur heard. Pulmonary/Chest: Effort normal and breath sounds normal. He has no wheezes. He has no rhonchi. He has no rales.  Abdominal: Soft. There is no tenderness.  Musculoskeletal: He exhibits edema (trace bilat LE edema).  Neurological: He is alert and oriented to person, place, and time.  Skin: Skin is warm and dry.  Psychiatric: He has a normal mood and affect.    ASSESSMENT:    1. Shortness of breath   2. Persistent atrial fibrillation ([facility])   3. Coronary artery disease involving native coronary artery of native heart without angina pectoris   4. Chronic diastolic (congestive) heart failure ([place])   5. Essential hypertension   6. Hyperlipidemia, unspecified hyperlipidemia type   7. Anxiety    PLAN:    In order of problems listed above:  1. Shortness of breath - He complains of chronic shortness of breath with certain activities. He had a fairly extensive workup in [place]. I will try to obtain the results of his echocardiogram, nuclear stress test and chest CT. He continues to have issues with weight gain. He feels as though he is doing a good job at maintaining a low calorie diet. He does not look particularly volume overloaded on exam. However, his  exam is difficult.  -  Obtain BMET, CBC, TSH, LFTs, BNP  -  Adjust Lasix if BNP significantly elevated  -  Request records from cardiologist in  [place] (Dr. Clent Ridges; [place], [place])  2. Persistent atrial fibrillation [facility]) -  Status post prior catheter ablation 2 and eventual Maze procedure with clipping of the left atrial appendage at the time of CABG in 11/16. He is maintaining normal sinus rhythm. Continue anticoagulation with apixaban.  3. Coronary artery disease involving native coronary artery of native heart without angina pectoris -  Status post CABG in 2016. He denies angina. Continue beta blocker, statin.  4. Chronic diastolic (congestive) heart failure (HCC) -  Volume appears stable. Check BNP as noted above. Adjust furosemide if needed. Obtain records from prior cardiologist in [place].  5. Essential hypertension - Blood pressure elevated. Increase lisinopril to 20 mg twice a day. Obtain BMET in 2 weeks.   6. Hyperlipidemia, unspecified hyperlipidemia type - Continue statin.   7. Anxiety -  He is trying to establish with a PCP. I agreed to fill his Celexa and Tranxene with 1 refill.  He will need to get a PCP for future refills.   Dispo:  Return in about 8 weeks (around 04/01/2017) for Routine Follow Up w/ [name] , or Richardson Dopp, PA-C.   Medication Adjustments/Labs and Tests Ordered: Current medicines are reviewed at length with the patient today.  Concerns regarding medicines are outlined above.  Orders/Tests:  Orders Placed This Encounter  Procedures  . Basic Metabolic Panel (BMET)  . CBC  . TSH  . Hepatic function panel  . Pro b natriuretic peptide (BNP)  . EKG 12-Lead   Medication changes: Meds ordered this encounter  Medications  . lisinopril (PRINIVIL,ZESTRIL) 20 MG tablet    Sig: Take 1 tablet (20 mg total) by mouth 2 (two) times daily.    Dispense:  180 tablet    Refill:  3    INCREASE IN FREQUENCY  . DISCONTD: clorazepate (TRANXENE)  7.5 MG tablet    Sig: Take 1 tablet (7.5 mg total) by mouth at bedtime.    Dispense:  30 tablet    Refill:  1    AFTER THE 1 REFILL PT WILL NEED TO HAVE FILLED WITH PCP  . citalopram (CELEXA) 10 MG tablet    Sig: TAKE 1 TABLET DAILY    Dispense:  30 tablet    Refill:  1    AFTER THE 1 REFILL PT WILL NEED TO HAVE FILLED WITH PCP  . clorazepate (TRANXENE) 7.5 MG tablet    Sig: Take 1 tablet (7.5 mg total) by mouth at bedtime.    Dispense:  30 tablet    Refill:  1    AFTER THE 1 REFILL PT WILL NEED TO HAVE FILLED WITH PCP   Signed, Richardson Dopp, PA-C  02/04/2017 5:37 PM    [facility] [place], [place], [place]  [address] Phone: [phone]; Fax: [phone]

## 2017-02-05 ENCOUNTER — Telehealth: Payer: Self-pay | Admitting: *Deleted

## 2017-02-05 ENCOUNTER — Encounter: Payer: Self-pay | Admitting: Physician Assistant

## 2017-02-05 DIAGNOSIS — R0602 Shortness of breath: Secondary | ICD-10-CM

## 2017-02-05 LAB — CBC
Hematocrit: 38.7 % (ref 37.5–51.0)
Hemoglobin: 12.5 g/dL — ABNORMAL LOW (ref 13.0–17.7)
MCH: 29.7 pg (ref 26.6–33.0)
MCHC: 32.3 g/dL (ref 31.5–35.7)
MCV: 92 fL (ref 79–97)
PLATELETS: 228 10*3/uL (ref 150–379)
RBC: 4.21 x10E6/uL (ref 4.14–5.80)
RDW: 14.1 % (ref 12.3–15.4)
WBC: 8.4 10*3/uL (ref 3.4–10.8)

## 2017-02-05 LAB — BASIC METABOLIC PANEL
BUN/Creatinine Ratio: 15 (ref 9–20)
BUN: 11 mg/dL (ref 6–24)
CALCIUM: 9.4 mg/dL (ref 8.7–10.2)
CHLORIDE: 96 mmol/L (ref 96–106)
CO2: 23 mmol/L (ref 20–29)
Creatinine, Ser: 0.75 mg/dL — ABNORMAL LOW (ref 0.76–1.27)
GFR calc Af Amer: 119 mL/min/{1.73_m2} (ref >59)
GFR calc non Af Amer: 103 mL/min/{1.73_m2} (ref >59)
GLUCOSE: 99 mg/dL (ref 65–99)
POTASSIUM: 4.4 mmol/L (ref 3.5–5.2)
SODIUM: 137 mmol/L (ref 134–144)

## 2017-02-05 LAB — HEPATIC FUNCTION PANEL
ALBUMIN: 4.4 g/dL (ref 3.5–5.5)
ALT: 47 IU/L — ABNORMAL HIGH (ref 0–44)
AST: 34 IU/L (ref 0–40)
Alkaline Phosphatase: 94 IU/L (ref 39–117)
Bilirubin Total: 0.6 mg/dL (ref 0.0–1.2)
Bilirubin, Direct: 0.14 mg/dL (ref 0.00–0.40)
TOTAL PROTEIN: 6.5 g/dL (ref 6.0–8.5)

## 2017-02-05 LAB — PRO B NATRIURETIC PEPTIDE: NT-PRO BNP: 196 pg/mL (ref 0–210)

## 2017-02-05 LAB — TSH: TSH: 4.72 u[IU]/mL — AB (ref 0.450–4.500)

## 2017-02-05 NOTE — Telephone Encounter (Signed)
Pt has been notified of lab results by phone with verbal understanding. Pt asked me if the PA said anything further as to why he is sob. I stated Brynda Rim. PA did not make mention of this in his results. I stated I will d/w Richardson Dopp, PA for further recommendations as to the sob. Pt thanked me for my call and that I will d/w PA further as to his sob. Pt is aware he will need to establish with a PCP in his new location and will need to f/u PCP in regards to TSH. Pt agreeable to plan of care at this time.

## 2017-02-05 NOTE — Telephone Encounter (Signed)
-----   Message from Liliane Shi, [place] sent at 02/05/2017  4:10 PM EDT ----- Please call the patient The Kidney function, BNP are normal. The hemoglobin, LFTs are stable. The TSH is minimally elevated. Continue with current treatment plan. FU with PCP for elevated TSH - will likely need repeat in several months.  Richardson Dopp, PA-C   02/05/2017 4:08 PM

## 2017-02-05 NOTE — Telephone Encounter (Signed)
-----   Message from Liliane Shi, [place] sent at 02/05/2017  4:45 PM EDT ----- The echocardiogram done in [place] in 11/19/2022 demonstrated an ejection fraction lower than his prior studies here. The stress test in 19-Nov-2022 was normal. Let's have him repeat an echocardiogram here (limited echo - check EF, wall motion). Richardson Dopp, PA-C    02/05/2017 4:45 PM

## 2017-02-05 NOTE — Telephone Encounter (Signed)
I called pt back with recommendations per  Brynda Rim. PA that stress test 3/18 was normal, echo 3/18 done in [place] did show decreased EF from prior studies. Went over recommendation per PA to schedule a Limited Echo which can be done the same day pt sees [place] since pt has moved recently to the [place], [place]. Pt is agreeable to this plan of care and has been scheduled for Limited Echo 03/25/17 @ 10:30 Am with Brynda Rim. PA @ 12:15 same day. Pt thanked me for my call and our help.

## 2017-02-28 ENCOUNTER — Other Ambulatory Visit: Payer: Self-pay | Admitting: Internal Medicine

## 2017-02-28 NOTE — Telephone Encounter (Signed)
Age 56 years 02/04/2017  Wt 133.3kg 02/04/2017  Saw Scott Weaver  02/04/2017 Hgb 12.5 HCT 38.7 02/04/2017 SrCr 0.75 Refill done for Eliquis 5mg  q 12 hours as requested

## 2017-03-25 ENCOUNTER — Ambulatory Visit (INDEPENDENT_AMBULATORY_CARE_PROVIDER_SITE_OTHER): Payer: 59 | Admitting: Physician Assistant

## 2017-03-25 ENCOUNTER — Ambulatory Visit (HOSPITAL_COMMUNITY): Payer: 59 | Attending: Internal Medicine

## 2017-03-25 ENCOUNTER — Encounter: Payer: Self-pay | Admitting: Physician Assistant

## 2017-03-25 ENCOUNTER — Other Ambulatory Visit: Payer: Self-pay

## 2017-03-25 ENCOUNTER — Other Ambulatory Visit: Payer: Self-pay | Admitting: Family Medicine

## 2017-03-25 VITALS — BP 140/82 | HR 60 | Ht 68.0 in | Wt 294.8 lb

## 2017-03-25 DIAGNOSIS — I10 Essential (primary) hypertension: Secondary | ICD-10-CM | POA: Diagnosis not present

## 2017-03-25 DIAGNOSIS — G4733 Obstructive sleep apnea (adult) (pediatric): Secondary | ICD-10-CM | POA: Diagnosis not present

## 2017-03-25 DIAGNOSIS — I4891 Unspecified atrial fibrillation: Secondary | ICD-10-CM | POA: Diagnosis not present

## 2017-03-25 DIAGNOSIS — R0602 Shortness of breath: Secondary | ICD-10-CM | POA: Diagnosis not present

## 2017-03-25 DIAGNOSIS — E669 Obesity, unspecified: Secondary | ICD-10-CM | POA: Insufficient documentation

## 2017-03-25 DIAGNOSIS — E785 Hyperlipidemia, unspecified: Secondary | ICD-10-CM | POA: Diagnosis not present

## 2017-03-25 DIAGNOSIS — I509 Heart failure, unspecified: Secondary | ICD-10-CM | POA: Diagnosis not present

## 2017-03-25 DIAGNOSIS — I2510 Atherosclerotic heart disease of native coronary artery without angina pectoris: Secondary | ICD-10-CM | POA: Diagnosis not present

## 2017-03-25 DIAGNOSIS — I481 Persistent atrial fibrillation: Secondary | ICD-10-CM

## 2017-03-25 DIAGNOSIS — I5032 Chronic diastolic (congestive) heart failure: Secondary | ICD-10-CM

## 2017-03-25 DIAGNOSIS — Z951 Presence of aortocoronary bypass graft: Secondary | ICD-10-CM | POA: Diagnosis not present

## 2017-03-25 DIAGNOSIS — I110 Hypertensive heart disease with heart failure: Secondary | ICD-10-CM | POA: Diagnosis not present

## 2017-03-25 DIAGNOSIS — Z8249 Family history of ischemic heart disease and other diseases of the circulatory system: Secondary | ICD-10-CM | POA: Diagnosis not present

## 2017-03-25 DIAGNOSIS — I422 Other hypertrophic cardiomyopathy: Secondary | ICD-10-CM | POA: Diagnosis not present

## 2017-03-25 DIAGNOSIS — Z87891 Personal history of nicotine dependence: Secondary | ICD-10-CM | POA: Insufficient documentation

## 2017-03-25 DIAGNOSIS — I4819 Other persistent atrial fibrillation: Secondary | ICD-10-CM

## 2017-03-25 NOTE — Patient Instructions (Addendum)
Medication Instructions:  1. Your physician recommends that you continue on your current medications as directed. Please refer to the Current Medication list given to you today.   Labwork: NONE ORDERED TODAY  Testing/Procedures: CARDAC MRI MORPHOLOGY WITH AND WITH OUT CONTRAST TO BE DONE AT    Follow-Up: [name] IN 2-3 MONTHS   YOU ARE BEING REFERRED TO DR. Haroldine Laws AT [facility]  Any Other Special Instructions Will Be Listed Below (If Applicable).     If you need a refill on your cardiac medications before your next appointment, please call your pharmacy.

## 2017-03-25 NOTE — Progress Notes (Signed)
Cardiology Office Note:    Date:  03/25/2017   ID:  [name], DOB 1961-05-21, MRN [id number]  PCP:  Dorena Cookey, MD  Cardiologist:  Dr. [name]    Referring MD: Dorena Cookey, MD   Chief Complaint  Patient presents with  . Follow-up    Shortness of breath    History of Present Illness:    Harold Scott is a 56 y.o. male with a hx of CAD, symptomatic persistent AF, HTN, diastolic HF, OSA on CPAP, anxiety/depression.  He has undergone catheter-based ablation for his atrial fibrillation x 2 in the past b/c it has been refractory to medical Rx (failing Flecainide, Multaq, Dofetilide, Amiodarone).  In 06/2015, he underwent CABG x 2 and Maze procedure with clipping of the LAA.  He had some post-op AFlutter but converted back to normal sinus rhythm. He last saw [name] in 08/2015.  Amiodarone was ultimately discontinued.  It has been decided to keep him on long-term anticoagulation with Eliquis.  He had moved to [place] in 2017 but recently moved back to [place].  He lives in [place] but has decided to continue follow up here for Cardiology.  He has continued to have stable dyspnea on exertion since prior to his CABG.  He did report having a stress test and echocardiogram while in [place]. I received those results and his echo in 3/18 demonstrated an EF of 40-45%. Nuclear stress test demonstrated no ischemia.  I did arrange a follow up Limited Echo here to recheck his EF.  This study was done today.   Harold Scott returns for follow-up. He is here alone. I reviewed his echocardiogram with Dr. Radford Pax. The patient has severe septal hypertrophy and moderate to severe diastolic dysfunction. EF is 60-65. The patient continues to be short of breath with certain activities. He also notes fatigue. He has had these symptoms for several years. However, he is starting to wonder if he will be able to continue to work. He denies chest discomfort, orthopnea, PND, syncope. He has mild pedal edema  without change. He denies any bleeding issues.  Prior CV studies:   The following studies were reviewed today:  Echo 03/25/17 Severe focal basal septal hypertrophy, moderate concentric LVH, EF 32-35, grade 3 diastolic dysfunction, aortic root dilated at 38 mm, moderate LAE  TEE (intraoperative) 06/30/15 EF 50-55%, mild to moderate MR, no LAA clot   Carotid US 06/28/15 Bilateral ICA 1-39%   LHC 06/20/15 LAD: prox 30%, mid 50%, D1 99%, D2 60% LCx: mid 30%, OM1 20% RCA:  Mid 30% 1. Severe stenosis moderate caliber Diagonal branch 2. Moderate stenosis mid LAD 3. Mild stenosis RCA 4. Mild stenosis Circumflex/OM 5. Elevated filling pressures Recommendations: Will review images with Dr. Roxy Manns. He is planning a MAZE procedure. The Diagonal lesion could be easily treated with PCI/stenting but timing will be based around timing of surgical procedure. Will start ASA 81 mg daily. Resume Eliquis tomorrow. D/C home today.   Echo 12/22/14 Severe focal basal hypertrophy, vigorous LVF, EF 65-70%, normal wall motion, trivial MR, mild LAE   Myoview 07/20/14 Mild basal inferolateral thinning consistent with probable soft tissue attenuation, cannot r/o subendocardial scar. No significant ischemia  LVEF 51% with inferior hypokinesis.  Overall low risk scan   Past Medical History:  Diagnosis Date  . Anxiety   . Arthritis   . Coronary artery disease    a. s/p CABG 06/2015 LIMA to D1 and distal LAD, sequentially  . Depression   .  Diastolic CHF, chronic ([place])   . Essential hypertension   . Gallstones   . GERD (gastroesophageal reflux disease)   . History of echocardiogram    Echo done in [place] (Dr. Clare Gandy) 3/18: EF 40-45, inf HK, diast dysfn, mild TR, RVSP 35  . History of hiatal hernia   . History of nuclear stress test    Myoview in [place], [place] 3/18: No ischemia, EF 64  . Hyperlipidemia   . Mitral regurgitation   . Morbid obesity ([place])   . Obstructive sleep apnea    a. on CPAP    . Paroxysmal atrial fibrillation (HCC)    a.  previously intolerant of Flecainide and Multaq b. PVI by Dr [name] 09/21/14 c. s/p MAZE Dr Roxy Manns 06/2015  . PONV (postoperative nausea and vomiting)   . S/P Maze operation for atrial fibrillation 06/30/2015   Complete bilateral atrial lesion set using bipolar radiofrequency and cryothermy ablation with clipping of LA appendage via conventional median sternotomy     Past Surgical History:  Procedure Laterality Date  . ABLATION  09/21/14   PVI by Dr [name]  . ATRIAL FIBRILLATION ABLATION N/A 09/21/2014   Procedure: ATRIAL FIBRILLATION ABLATION;  Surgeon: [name], MD;  Location: [facility] CATH LAB;  Service: Cardiovascular;  Laterality: N/A;  . CARDIAC CATHETERIZATION N/A 06/20/2015   Procedure: Right/Left Heart Cath and Coronary Angiography;  Surgeon: Burnell Blanks, MD;  Location: [place] CV LAB;  Service: Cardiovascular;  Laterality: N/A;  . CHOLECYSTECTOMY  08/05/2012   Procedure: LAPAROSCOPIC CHOLECYSTECTOMY WITH INTRAOPERATIVE CHOLANGIOGRAM;  Surgeon: [name], MD,FACS;  Location: [place];  Service: General;  Laterality: N/A;  . CORONARY ARTERY BYPASS GRAFT N/A 06/30/2015   Procedure: CORONARY ARTERY BYPASS GRAFTING (CABG)X2 SEQ LIMA-DIAG-LAD;  Surgeon: Rexene Alberts, MD;  Location: [place];  Service: Open Heart Surgery;  Laterality: N/A;  . ELECTROPHYSIOLOGIC STUDY N/A 01/11/2015   Procedure: Atrial Fibrillation Ablation;  Surgeon: [name], MD;  Location: [place] CV LAB;  Service: Cardiovascular;  Laterality: N/A;  . HAND SURGERY    . LAPAROSCOPIC APPENDECTOMY N/A 02/13/2014   Procedure: APPENDECTOMY LAPAROSCOPIC;  Surgeon: [name], MD;  Location: [facility];  Service: General;  Laterality: N/A;  . MAZE N/A 06/30/2015   Procedure: MAZE;  Surgeon: Rexene Alberts, MD;  Location: [place];  Service: Open Heart Surgery;  Laterality: N/A;  . TEE WITHOUT CARDIOVERSION N/A 09/20/2014   Procedure: TRANSESOPHAGEAL ECHOCARDIOGRAM  (TEE);  Surgeon: [name], MD;  Location: [place];  Service: Cardiovascular;  Laterality: N/A;  . TEE WITHOUT CARDIOVERSION N/A 01/10/2015   Procedure: TRANSESOPHAGEAL ECHOCARDIOGRAM (TEE);  Surgeon: Sanda Klein, MD;  Location[facility] ENDOSCOPY;  Service: Cardiovascular;  Laterality: N/A;  . TEE WITHOUT CARDIOVERSION N/A 06/30/2015   Procedure: TRANSESOPHAGEAL ECHOCARDIOGRAM (TEE);  Surgeon: Rexene Alberts, MD;  Location: [place];  Service: Open Heart Surgery;  Laterality: N/A;  . TONSILLECTOMY    . WISDOM TOOTH EXTRACTION      Current Medications: Current Meds  Medication Sig  . aspirin EC 81 MG EC tablet Take 1 tablet (81 mg total) by mouth daily.  . carvedilol (COREG) 12.5 MG tablet TAKE 1 TABLET(S) TWICE A DAY BY ORAL ROUTE WITH MEALS FOR 60 DAYS.  Marland Kitchen cholestyramine (QUESTRAN) 4 g packet TAKE 1 PACKET MIX AND DRINK TWICE A DAY  . citalopram (CELEXA) 10 MG tablet TAKE 1 TABLET DAILY  . clorazepate (TRANXENE) 7.5 MG tablet Take 1 tablet (7.5 mg  total) by mouth at bedtime.  Marland Kitchen ELIQUIS 5 MG TABS tablet (NO INS) TAKE 1 TABLET BY MOUTH TWICE A DAY  . esomeprazole (NEXIUM) 40 MG capsule TAKE 1 CAPSULE DAILY AT 12 NOON  . furosemide (LASIX) 40 MG tablet TAKE 1 TABLET (40 MG TOTAL) BY MOUTH DAILY.  Marland Kitchen guaiFENesin 200 MG tablet Take 200 mg by mouth 2 (two) times daily as needed (for cold).  Marland Kitchen KLOR-CON M20 20 MEQ tablet TAKE 1 TABLET (20 MEQ TOTAL) BY MOUTH DAILY.  Marland Kitchen lisinopril (PRINIVIL,ZESTRIL) 20 MG tablet Take 1 tablet (20 mg total) by mouth 2 (two) times daily.  . simvastatin (ZOCOR) 20 MG tablet Take 1 tablet (20 mg total) by mouth daily at 6 PM.     Allergies:   Flecainide; Multaq [dronedarone]; Apixaban; Toprol xl  [metoprolol tartrate]; and Penicillins   Social History   Social History  . Marital status: Married    Spouse name: N/A  . Number of children: 1  . Years of education: N/A   Occupational History  . [employer]   Social History Main Topics  . Smoking status:  Former Smoker    Packs/day: 0.50    Years: 30.00    Types: Cigarettes    Quit date: 07/23/2006  . Smokeless tobacco: Never Used  . Alcohol use 4.2 - 8.4 oz/week    7 - 14 Standard drinks or equivalent per week     Comment: social  . Drug use: No  . Sexual activity: Not Asked   Other Topics Concern  . None   Social History Narrative   Pt works as a Secondary school teacher     Family Hx: The patient's family history includes Hypertrophic cardiomyopathy in his son; Other in his unknown relative. He was adopted.  ROS:   Please see the history of present illness.    Review of Systems  Constitution: Positive for diaphoresis and malaise/fatigue.  HENT: Positive for hearing loss.   Cardiovascular: Positive for dyspnea on exertion.  Respiratory: Positive for snoring.   Psychiatric/Behavioral: Positive for depression. The patient is nervous/anxious.    All other systems reviewed and are negative.   EKGs/Labs/Other Test Reviewed:    EKG:  EKG is not ordered today.  The ekg ordered today demonstrates n/a  Recent Labs: 02/04/2017: ALT 47; BUN 11; Creatinine, Ser 0.75; Hemoglobin 12.5; NT-Pro BNP 196; Platelets 228; Potassium 4.4; Sodium 137; TSH 4.720   Recent Lipid Panel Lab Results  Component Value Date/Time   CHOL 228 (H) 07/19/2014 04:25 AM   TRIG 338 (H) 07/19/2014 04:25 AM   HDL 30 (L) 07/19/2014 04:25 AM   CHOLHDL 7.6 07/19/2014 04:25 AM   LDLCALC 130 (H) 07/19/2014 04:25 AM    Physical Exam:    VS:  BP 140/82   Pulse 60   Ht 5\' 8"  (1.727 m)   Wt 294 lb 12.8 oz (133.7 kg)   SpO2 97%   BMI 44.82 kg/m     Wt Readings from Last 3 Encounters:  03/25/17 294 lb 12.8 oz (133.7 kg)  02/04/17 293 lb 12.8 oz (133.3 kg)  02/23/16 282 lb 11.2 oz (128.2 kg)     Physical Exam  Constitutional: He is oriented to person, place, and time. He appears well-developed and well-nourished. No distress.  HENT:  Head: Normocephalic and atraumatic.  Eyes: No scleral icterus.  Neck: No JVD  present.  Cardiovascular: Normal rate, regular rhythm and normal heart sounds.   No murmur heard. Pulmonary/Chest: He has no wheezes. He has  no rales.  Abdominal: Soft. There is no hepatomegaly.  Musculoskeletal: He exhibits no edema.  Neurological: He is alert and oriented to person, place, and time.  Skin: Skin is warm and dry.  Psychiatric: He has a normal mood and affect.    ASSESSMENT:    1. Asymmetric septal hypertrophy (HCC)   2. SOB (shortness of breath)   3. Persistent atrial fibrillation ([place])   4. Coronary artery disease involving native coronary artery of native heart without angina pectoris   5. Chronic diastolic (congestive) heart failure (HCC)   6. Essential hypertension   7. Family history of hypertrophic cardiomyopathy    PLAN:    In order of problems listed above:  1. Asymmetric septal hypertrophy (HCC) -  The patient continues to have significant shortness of breath with mild to moderate activities. This is a chronic symptom. I initially considered proceeding with cardiopulmonary stress testing. Overall, I suspected that his obesity was contributing to his symptoms more than anything else. However, given the findings on his echocardiogram, I question if HOCM may be contributing to his symptoms. He denies a history of syncope. He is adopted and does not know about his family history. However, his son did have a septal myomectomy at age 54. So, we have to presume that he has HOCM. He will need further evaluation with a cardiac MRI. As noted, I reviewed his echocardiogram with Dr. Radford Pax today. We had a long discussion about his case and ultimately decided to refer him to Dr. Haroldine Laws in the [facility] for further evaluation. He will probably also need to see Dr. Caryl Comes (EP) at some point but I will leave this up to Dr. Haroldine Laws. If his MRI confirms significant septal hypertrophy with evidence of HOCM, he may need to be referred to [facility].  2. SOB (shortness of  breath) -  I suspect his shortness of breath is multifactorial. However, given the findings on echocardiogram, HOCM needs to be ruled out. Proceed with cardiac MRI as noted. Refer to Dr. Haroldine Laws. He has seen pulmonology in the past. I have also encouraged him to arrange follow-up with pulmonology.  3. Persistent atrial fibrillation ([place])- Status post prior ablation 2 and eventual Maze procedure with clipping of the left atrial appendage at time of bypass in 11/16. He remains in sinus rhythm by exam. Continue anticoagulation with Apixaban.  4. Coronary artery disease involving native coronary artery of native heart without angina pectoris Status post CABG in 2016. Nuclear stress test in [place] earlier this year without ischemia. He denies anginal symptoms. Continue aspirin, statin.  5. Chronic diastolic (congestive) heart failure (HCC) Volume appears stable. Continue current therapy.  6. Essential hypertension Blood pressure still borderline elevated. Continue current therapy for now.  7. Family history of hypertrophic cardiomyopathy -  As noted, cardiac MRI will be obtained. He will be referred to Dr. Haroldine Laws. We may need to consider genetic testing.  Total time spent with patient today 45 minutes. This includes reviewing records, evaluating the patient and coordinating care. Face-to-face time >50%.   Dispo:  Return in about 3 months (around 06/25/2017) for Routine Follow Up w/ [name].   Medication Adjustments/Labs and Tests Ordered: Current medicines are reviewed at length with the patient today.  Concerns regarding medicines are outlined above.  Tests Ordered: Orders Placed This Encounter  Procedures  . MR Card Morphology Wo/W Cm  . AMB referral to CHF clinic   Medication Changes: No orders of the defined types were placed in this  encounter.   Signed, Richardson Dopp, PA-C  03/25/2017 5:03 PM    [facility] [place], [place], [place]   [id number] Phone: [phone]; Fax: [phone]

## 2017-04-01 ENCOUNTER — Telehealth: Payer: Self-pay | Admitting: Physician Assistant

## 2017-04-01 NOTE — Telephone Encounter (Signed)
Called patient to find out when he wants his cardiac MRI.  He would like any day of the week in the morning.  Message sent to precert and technician.

## 2017-04-03 ENCOUNTER — Other Ambulatory Visit: Payer: Self-pay | Admitting: Physician Assistant

## 2017-04-11 ENCOUNTER — Ambulatory Visit (HOSPITAL_COMMUNITY)
Admission: RE | Admit: 2017-04-11 | Discharge: 2017-04-11 | Disposition: A | Discharge status: Home / Self Care | Payer: 59 | Source: Ambulatory Visit | Attending: Physician Assistant | Admitting: Physician Assistant

## 2017-04-11 DIAGNOSIS — I422 Other hypertrophic cardiomyopathy: Secondary | ICD-10-CM | POA: Insufficient documentation

## 2017-04-11 DIAGNOSIS — I313 Pericardial effusion (noninflammatory): Secondary | ICD-10-CM | POA: Insufficient documentation

## 2017-04-11 DIAGNOSIS — I421 Obstructive hypertrophic cardiomyopathy: Secondary | ICD-10-CM | POA: Diagnosis not present

## 2017-04-11 DIAGNOSIS — Z0189 Encounter for other specified special examinations: Secondary | ICD-10-CM | POA: Insufficient documentation

## 2017-04-11 LAB — CREATININE, SERUM
CREATININE: 0.99 mg/dL (ref 0.61–1.24)
GFR calc Af Amer: >60 mL/min (ref >60)

## 2017-04-11 MED ORDER — GADOBENATE DIMEGLUMINE 529 MG/ML IV SOLN
20.0000 mL | Freq: Once | INTRAVENOUS | Status: AC | PRN
  Administered 2017-04-11: 40 mL via INTRAVENOUS

## 2017-04-14 ENCOUNTER — Encounter: Payer: Self-pay | Admitting: Physician Assistant

## 2017-04-15 ENCOUNTER — Telehealth: Payer: Self-pay | Admitting: Internal Medicine

## 2017-04-15 NOTE — Telephone Encounter (Signed)
I spoke with patient. Pt's appointment with Dr [name] has been moved up to April 23, 2017, he is aware. Pt has questions about test results and would like to speak with Nicki Reaper tomorrow, pt states he would not be available until after 12 Noon tomorrow.  Pt advised I will forward to [place].

## 2017-04-15 NOTE — Telephone Encounter (Signed)
Harold Scott his returning a call . Thanks

## 2017-04-16 NOTE — Telephone Encounter (Signed)
I called the patient at home today. We reviewed the findings on his MRI. The MRI report suggests signal averaged ECG or EP consult. Therefore, I would like him to see his primary cardiologist, Dr. [name], prior to proceeding with further workup. We have not cancelled the appointment with Dr. Glori Bickers in the advanced heart failure clinic yet.  This can be cancelled if it is not felt to be necessary.  He understands this and will see [name] next week. Richardson Dopp, PA-C    04/16/2017 1:07 PM

## 2017-04-24 ENCOUNTER — Encounter: Payer: Self-pay | Admitting: Internal Medicine

## 2017-04-24 ENCOUNTER — Ambulatory Visit (INDEPENDENT_AMBULATORY_CARE_PROVIDER_SITE_OTHER): Payer: 59 | Admitting: Internal Medicine

## 2017-04-24 VITALS — BP 160/96 | HR 71 | Ht 68.0 in | Wt 300.0 lb

## 2017-04-24 DIAGNOSIS — R0602 Shortness of breath: Secondary | ICD-10-CM

## 2017-04-24 DIAGNOSIS — I481 Persistent atrial fibrillation: Secondary | ICD-10-CM | POA: Diagnosis not present

## 2017-04-24 DIAGNOSIS — G4733 Obstructive sleep apnea (adult) (pediatric): Secondary | ICD-10-CM | POA: Diagnosis not present

## 2017-04-24 DIAGNOSIS — I422 Other hypertrophic cardiomyopathy: Secondary | ICD-10-CM | POA: Diagnosis not present

## 2017-04-24 DIAGNOSIS — I4819 Other persistent atrial fibrillation: Secondary | ICD-10-CM

## 2017-04-24 NOTE — Progress Notes (Signed)
PCP: Dorena Cookey, MD  Primary EP: Dr [name]  Harold Scott is a 56 y.o. male who presents today for routine electrophysiology followup.  I have not seen him in several years as he had moved to [facility] to care for his mother.  He has now moved to [place] but wishes to follow with our office for cardiology care.  He is doing reasonably well.  He has not lost weight--> actually has continued to gain weight since I saw him last.  His afib is well controlled.  He has stable, chronic shortness of breath and poor exercise tolerance.    Today, he denies symptoms of palpitations, chest pain, lower extremity edema, dizziness, presyncope, or syncope.  The patient is otherwise without complaint today.   Past Medical History:  Diagnosis Date  . Anxiety   . Arthritis   . Coronary artery disease    a. s/p CABG 06/2015 LIMA to D1 and distal LAD, sequentially  . Depression   . Diastolic CHF, chronic ([place])   . Essential hypertension   . Gallstones   . GERD (gastroesophageal reflux disease)   . History of echocardiogram    Echo done in [place] (Dr. Clare Gandy) 3/18: EF 40-45, inf HK, diast dysfn, mild TR, RVSP 35  . History of hiatal hernia   . History of nuclear stress test    Myoview in [place], [place] 3/18: No ischemia, EF 64  . HOCM (hypertrophic obstructive cardiomyopathy) ([place])    cMRI 8/18: c/w non-obs resting HOCM, septal thickness 28 mm, no obvious SAM, near mid cavity obliteration, EF 58, significant gadolinium uptake in mid, ant and inf septum and ant wall suggesting presence of myofibrillar disarray, small pericardial eff, severe LAE  . Hyperlipidemia   . Mitral regurgitation   . Morbid obesity ([place])   . Obstructive sleep apnea    a. on CPAP  . Paroxysmal atrial fibrillation (HCC)    a.  previously intolerant of Flecainide and Multaq b. PVI by Dr [name] 09/21/14 c. s/p MAZE Dr Roxy Manns 06/2015  . S/P Maze operation for atrial fibrillation 06/30/2015   Complete bilateral atrial lesion set using bipolar radiofrequency and cryothermy ablation with clipping of LA appendage via conventional median sternotomy    Past Surgical History:  Procedure Laterality Date  . ABLATION  09/21/14   PVI by Dr [name]  . ATRIAL FIBRILLATION ABLATION N/A 09/21/2014   Procedure: ATRIAL FIBRILLATION ABLATION;  Surgeon: [name], MD;  Location: [facility] CATH LAB;  Service: Cardiovascular;  Laterality: N/A;  . CARDIAC CATHETERIZATION N/A 06/20/2015   Procedure: Right/Left Heart Cath and Coronary Angiography;  Surgeon: Burnell Blanks, MD;  Location: [place] CV LAB;  Service: Cardiovascular;  Laterality: N/A;  . CHOLECYSTECTOMY  08/05/2012   Procedure: LAPAROSCOPIC CHOLECYSTECTOMY WITH INTRAOPERATIVE CHOLANGIOGRAM;  Surgeon: [name], MD,FACS;  Location: [place];  Service: General;  Laterality: N/A;  . CORONARY ARTERY BYPASS GRAFT N/A 06/30/2015   Procedure: CORONARY ARTERY BYPASS GRAFTING (CABG)X2 SEQ LIMA-DIAG-LAD;  Surgeon: Rexene Alberts, MD;  Location: [place];  Service: Open Heart Surgery;  Laterality: N/A;  . ELECTROPHYSIOLOGIC STUDY N/A 01/11/2015   Procedure: Atrial Fibrillation Ablation;  Surgeon: [name], MD;  Location: [place] CV LAB;  Service: Cardiovascular;  Laterality: N/A;  . HAND SURGERY    . LAPAROSCOPIC APPENDECTOMY N/A 02/13/2014   Procedure: APPENDECTOMY LAPAROSCOPIC;  Surgeon: [name], MD;  [facility];  Service: General;  Laterality:  N/A;  . MAZE N/A 06/30/2015   Procedure: MAZE;  Surgeon: Rexene Alberts, MD;  Location: [place];  Service: Open Heart Surgery;  Laterality: N/A;  . TEE WITHOUT CARDIOVERSION N/A 09/20/2014   Procedure: TRANSESOPHAGEAL ECHOCARDIOGRAM (TEE);  Surgeon: [name], MD;  Location: [place];  Service: Cardiovascular;  Laterality: N/A;  . TEE WITHOUT CARDIOVERSION N/A 01/10/2015   Procedure: TRANSESOPHAGEAL ECHOCARDIOGRAM (TEE);  Surgeon: Sanda Klein, MD;  Location: [facility] ENDOSCOPY;   Service: Cardiovascular;  Laterality: N/A;  . TEE WITHOUT CARDIOVERSION N/A 06/30/2015   Procedure: TRANSESOPHAGEAL ECHOCARDIOGRAM (TEE);  Surgeon: Rexene Alberts, MD;  Location: [place];  Service: Open Heart Surgery;  Laterality: N/A;  . TONSILLECTOMY    . WISDOM TOOTH EXTRACTION      ROS- all systems are reviewed and negatives except as per HPI above  Current Outpatient Prescriptions  Medication Sig Dispense Refill  . aspirin EC 81 MG EC tablet Take 1 tablet (81 mg total) by mouth daily.    . carvedilol (COREG) 12.5 MG tablet TAKE 1 TABLET(S) TWICE A DAY BY ORAL ROUTE WITH MEALS FOR 60 DAYS.  3  . cholestyramine (QUESTRAN) 4 g packet TAKE 1 PACKET MIX AND DRINK TWICE A DAY 60 packet 1  . citalopram (CELEXA) 10 MG tablet TAKE 1 TABLET BY MOUTH EVERY DAY 100 tablet 4  . clorazepate (TRANXENE) 7.5 MG tablet Take 1 tablet (7.5 mg total) by mouth at bedtime. 30 tablet 1  . ELIQUIS 5 MG TABS tablet (NO INS) TAKE 1 TABLET BY MOUTH TWICE A DAY 60 tablet 5  . esomeprazole (NEXIUM) 40 MG capsule TAKE 1 CAPSULE DAILY AT 12 NOON 90 capsule 1  . furosemide (LASIX) 40 MG tablet TAKE 1 TABLET (40 MG TOTAL) BY MOUTH DAILY. 30 tablet 6  . guaiFENesin 200 MG tablet Take 200 mg by mouth 2 (two) times daily as needed (for cold).    Marland Kitchen KLOR-CON M20 20 MEQ tablet TAKE 1 TABLET (20 MEQ TOTAL) BY MOUTH DAILY. 30 tablet 6  . lisinopril (PRINIVIL,ZESTRIL) 20 MG tablet Take 1 tablet (20 mg total) by mouth 2 (two) times daily. 180 tablet 3  . simvastatin (ZOCOR) 20 MG tablet Take 1 tablet (20 mg total) by mouth daily at 6 PM. 90 tablet 0   No current facility-administered medications for this visit.     Physical Exam: Vitals:   04/24/17 1530  BP: (!) 160/96  Pulse: 71  SpO2: 94%  Weight: 300 lb (136.1 kg)  Height: 5\' 8"  (1.727 m)    GEN- The patient is overweight appearing, alert and oriented x 3 today.   Head- normocephalic, atraumatic Eyes-  Sclera clear, conjunctiva pink Ears- hearing  intact Oropharynx- clear Lungs- Clear to ausculation bilaterally, normal work of breathing Heart- Regular rate and rhythm, no murmurs, rubs or gallops, PMI not laterally displaced GI- soft, NT, ND, + BS Extremities- no clubbing, cyanosis, or edema  EKG tracing ordered today is personally reviewed and shows sinus rhythm 71 bpm, PR 172 msec, QRS 116 msec, QTc 480 msec, nonspecific ST/T changes  Echo and cardiac MRI are reviewed at length with the patient today.  Assessment and Plan:  1. Persistent afib Well controlled post maze No changes today  2. Hypertrophic cardiomyopathy No obstruction noted on echo or MRI and thus should not be responsible for his symptoms of SOB (below).  We discussed this at length today.  This has been seen on prior echoes and also on his prior cardiac CT.  Given absence of obstruction, we have been managing conservatively.  In the absence of arrhythmias, syncope, etc, no indication for ICD at this time.  I have reviewed with Dr Caryl Comes who agrees.  Will consider 48 hour holter on return to evaluate for asymptomatic ventricular arrhythmias. His son has known HOCM and has undergone surgery for this previously.  He has no other children and is himself adopted.  As he does not know his siblings and other family, there is no real indication for genetic testing at this time.  3. SOB Unclear etiology This has been chronic (several years) and was previously attributes to afib/ obesity/ deconditioning.  Unfortunately, he has made no progress with weight loss.  Lifestyle modification is encouraged today. He does have diastolic dysfunction.  Sodium restriction is encouraged.  4. CAD S/p CABG Stable No change required today  5. OSA Compliance with CPAP is encouraged  6. Obesity As above Body mass index is 45.61 kg/m. I have encouraged him to consider bariatric referral for surgery.  He would like to reach out to Dr Redmond Pulling who did his prior gall bladder surgery and  says that he will do so.  Return to see me in 3 months Consider CPX testing to further evaluation SOB at that time as well as holter to evaluate for ventricular ectopy related to his HCM.  Very complicated patient. A high level of decision making was required for this encounter today.  [name] MD, [facility] 04/24/2017 4:58 PM

## 2017-04-24 NOTE — Patient Instructions (Addendum)
Medication Instructions:  Your physician recommends that you continue on your current medications as directed. Please refer to the Current Medication list given to you today.   Labwork: None ordered   Testing/Procedures: None ordered   Follow-Up: Your physician recommends that you schedule a follow-up appointment in: 3 months with Dr [name]  Dr Greer Pickerel is MS you saw in the past  Low-Sodium Eating Plan Sodium, which is an element that makes up salt, helps you maintain a healthy balance of fluids in your body. Too much sodium can increase your blood pressure and cause fluid and waste to be held in your body. Your health care provider or dietitian may recommend following this plan if you have high blood pressure (hypertension), kidney disease, liver disease, or heart failure. Eating less sodium can help lower your blood pressure, reduce swelling, and protect your heart, liver, and kidneys. What are tips for following this plan? General guidelines  Most people on this plan should limit their sodium intake to 2,000 mg (milligrams) of sodium each day. Reading food labels  The Nutrition Facts label lists the amount of sodium in one serving of the food. If you eat more than one serving, you must multiply the listed amount of sodium by the number of servings.  Choose foods with less than 140 mg of sodium per serving.  Avoid foods with 300 mg of sodium or more per serving. Shopping  Look for lower-sodium products, often labeled as "low-sodium" or "no salt added."  Always check the sodium content even if foods are labeled as "unsalted" or "no salt added".  Buy fresh foods. ? Avoid canned foods and premade or frozen meals. ? Avoid canned, cured, or processed meats  Buy breads that have less than 80 mg of sodium per slice. Cooking  Eat more home-cooked food and less restaurant, buffet, and fast food.  Avoid adding salt when cooking. Use salt-free seasonings or herbs instead of  table salt or sea salt. Check with your health care provider or pharmacist before using salt substitutes.  Cook with plant-based oils, such as canola, sunflower, or olive oil. Meal planning  When eating at a restaurant, ask that your food be prepared with less salt or no salt, if possible.  Avoid foods that contain MSG (monosodium glutamate). MSG is sometimes added to [place] food, bouillon, and some canned foods. What foods are recommended? The items listed may not be a complete list. Talk with your dietitian about what dietary choices are best for you. Grains Low-sodium cereals, including oats, puffed wheat and rice, and shredded wheat. Low-sodium crackers. Unsalted rice. Unsalted pasta. Low-sodium bread. Whole-grain breads and whole-grain pasta. Vegetables Fresh or frozen vegetables. "No salt added" canned vegetables. "No salt added" tomato sauce and paste. Low-sodium or reduced-sodium tomato and vegetable juice. Fruits Fresh, frozen, or canned fruit. Fruit juice. Meats and other protein foods Fresh or frozen (no salt added) meat, poultry, seafood, and fish. Low-sodium canned tuna and salmon. Unsalted nuts. Dried peas, beans, and lentils without added salt. Unsalted canned beans. Eggs. Unsalted nut butters. Dairy Milk. Soy milk. Cheese that is naturally low in sodium, such as ricotta cheese, fresh mozzarella, or Swiss cheese Low-sodium or reduced-sodium cheese. Cream cheese. Yogurt. Fats and oils Unsalted butter. Unsalted margarine with no trans fat. Vegetable oils such as canola or olive oils. Seasonings and other foods Fresh and dried herbs and spices. Salt-free seasonings. Low-sodium mustard and ketchup. Sodium-free salad dressing. Sodium-free light mayonnaise. Fresh or refrigerated horseradish. Lemon juice. Vinegar. Homemade,  reduced-sodium, or low-sodium soups. Unsalted popcorn and pretzels. Low-salt or salt-free chips. What foods are not recommended? The items listed may not be a  complete list. Talk with your dietitian about what dietary choices are best for you. Grains Instant hot cereals. Bread stuffing, pancake, and biscuit mixes. Croutons. Seasoned rice or pasta mixes. Noodle soup cups. Boxed or frozen macaroni and cheese. Regular salted crackers. Self-rising flour. Vegetables Sauerkraut, pickled vegetables, and relishes. Olives. [place] fries. Onion rings. Regular canned vegetables (not low-sodium or reduced-sodium). Regular canned tomato sauce and paste (not low-sodium or reduced-sodium). Regular tomato and vegetable juice (not low-sodium or reduced-sodium). Frozen vegetables in sauces. Meats and other protein foods Meat or fish that is salted, canned, smoked, spiced, or pickled. Bacon, ham, sausage, hotdogs, corned beef, chipped beef, packaged lunch meats, salt pork, jerky, pickled herring, anchovies, regular canned tuna, sardines, salted nuts. Dairy Processed cheese and cheese spreads. Cheese curds. Blue cheese. Feta cheese. String cheese. Regular cottage cheese. Buttermilk. Canned milk. Fats and oils Salted butter. Regular margarine. Ghee. Bacon fat. Seasonings and other foods Onion salt, garlic salt, seasoned salt, table salt, and sea salt. Canned and packaged gravies. Worcestershire sauce. Tartar sauce. Barbecue sauce. Teriyaki sauce. Soy sauce, including reduced-sodium. Steak sauce. Fish sauce. Oyster sauce. Cocktail sauce. Horseradish that you find on the shelf. Regular ketchup and mustard. Meat flavorings and tenderizers. Bouillon cubes. Hot sauce and Tabasco sauce. Premade or packaged marinades. Premade or packaged taco seasonings. Relishes. Regular salad dressings. Salsa. Potato and tortilla chips. Corn chips and puffs. Salted popcorn and pretzels. Canned or dried soups. Pizza. Frozen entrees and pot pies. Summary  Eating less sodium can help lower your blood pressure, reduce swelling, and protect your heart, liver, and kidneys.  Most people on this plan  should limit their sodium intake to 1,500-2,000 mg (milligrams) of sodium each day.  Canned, boxed, and frozen foods are high in sodium. Restaurant foods, fast foods, and pizza are also very high in sodium. You also get sodium by adding salt to food.  Try to cook at home, eat more fresh fruits and vegetables, and eat less fast food, canned, processed, or prepared foods. This information is not intended to replace advice given to you by your health care provider. Make sure you discuss any questions you have with your health care provider. Document Released: 01/26/2002 Document Revised: 07/30/2016 Document Reviewed: 07/30/2016 Elsevier Interactive Patient Education  2017 [employer].

## 2017-04-26 ENCOUNTER — Encounter (HOSPITAL_COMMUNITY): Payer: 59 | Admitting: Internal Medicine

## 2017-04-28 ENCOUNTER — Encounter: Payer: Self-pay | Admitting: Internal Medicine

## 2017-05-10 ENCOUNTER — Encounter: Payer: Self-pay | Admitting: Family Medicine

## 2017-05-13 ENCOUNTER — Encounter (HOSPITAL_COMMUNITY): Payer: 59 | Admitting: Internal Medicine

## 2017-05-27 ENCOUNTER — Ambulatory Visit: Payer: Self-pay | Admitting: Internal Medicine

## 2017-07-31 ENCOUNTER — Encounter: Payer: Self-pay | Admitting: Internal Medicine

## 2017-07-31 ENCOUNTER — Ambulatory Visit (INDEPENDENT_AMBULATORY_CARE_PROVIDER_SITE_OTHER): Payer: 59 | Admitting: Internal Medicine

## 2017-07-31 VITALS — BP 160/100 | HR 64 | Ht 67.5 in | Wt 293.6 lb

## 2017-07-31 DIAGNOSIS — I4819 Other persistent atrial fibrillation: Secondary | ICD-10-CM

## 2017-07-31 DIAGNOSIS — R0602 Shortness of breath: Secondary | ICD-10-CM

## 2017-07-31 DIAGNOSIS — I481 Persistent atrial fibrillation: Secondary | ICD-10-CM | POA: Diagnosis not present

## 2017-07-31 DIAGNOSIS — I10 Essential (primary) hypertension: Secondary | ICD-10-CM

## 2017-07-31 DIAGNOSIS — I422 Other hypertrophic cardiomyopathy: Secondary | ICD-10-CM | POA: Diagnosis not present

## 2017-07-31 LAB — CBC WITH DIFFERENTIAL/PLATELET
BASOS: 0 %
Basophils Absolute: 0 10*3/uL (ref 0.0–0.2)
EOS (ABSOLUTE): 0.2 10*3/uL (ref 0.0–0.4)
EOS: 3 %
HEMATOCRIT: 37.7 % (ref 37.5–51.0)
Hemoglobin: 12.7 g/dL — ABNORMAL LOW (ref 13.0–17.7)
IMMATURE GRANULOCYTES: 0 %
Immature Grans (Abs): 0 10*3/uL (ref 0.0–0.1)
LYMPHS ABS: 1.6 10*3/uL (ref 0.7–3.1)
Lymphs: 21 %
MCH: 30.2 pg (ref 26.6–33.0)
MCHC: 33.7 g/dL (ref 31.5–35.7)
MCV: 90 fL (ref 79–97)
Monocytes Absolute: 0.7 10*3/uL (ref 0.1–0.9)
Monocytes: 9 %
NEUTROS ABS: 5.3 10*3/uL (ref 1.4–7.0)
NEUTROS PCT: 67 %
Platelets: 227 10*3/uL (ref 150–379)
RBC: 4.21 x10E6/uL (ref 4.14–5.80)
RDW: 14.2 % (ref 12.3–15.4)
WBC: 7.9 10*3/uL (ref 3.4–10.8)

## 2017-07-31 LAB — BASIC METABOLIC PANEL
BUN / CREAT RATIO: 12 (ref 9–20)
BUN: 10 mg/dL (ref 6–24)
CO2: 27 mmol/L (ref 20–29)
CREATININE: 0.84 mg/dL (ref 0.76–1.27)
Calcium: 9.2 mg/dL (ref 8.7–10.2)
Chloride: 96 mmol/L (ref 96–106)
GFR, EST AFRICAN AMERICAN: 114 mL/min/{1.73_m2} (ref >59)
GFR, EST NON AFRICAN AMERICAN: 99 mL/min/{1.73_m2} (ref >59)
Glucose: 167 mg/dL — ABNORMAL HIGH (ref 65–99)
Potassium: 4.3 mmol/L (ref 3.5–5.2)
SODIUM: 138 mmol/L (ref 134–144)

## 2017-07-31 NOTE — Progress Notes (Signed)
PCP: System, Pcp Not In  Primary EP: Dr [name]  Harold Scott is a 56 y.o. male who presents today for routine electrophysiology followup.  Since last being seen in our clinic, the patient reports doing very well. afib is well controlled.  SOB is stable. Today, he denies symptoms of palpitations, chest pain, lower extremity edema, dizziness, presyncope, or syncope.  The patient is otherwise without complaint today.   Past Medical History:  Diagnosis Date  . Anxiety   . Arthritis   . Coronary artery disease    a. s/p CABG 06/2015 LIMA to D1 and distal LAD, sequentially  . Depression   . Diastolic CHF, chronic ([place])   . Essential hypertension   . Gallstones   . GERD (gastroesophageal reflux disease)   . History of echocardiogram    Echo done in [place] (Dr. Clare Gandy) 3/18: EF 40-45, inf HK, diast dysfn, mild TR, RVSP 35  . History of hiatal hernia   . History of nuclear stress test    Myoview in [place], [place] 3/18: No ischemia, EF 64  . Hyperlipidemia   . Hypertrophic cardiomyopathy ([place])    cMRI 8/18: c/w non-obs resting HOCM, septal thickness 28 mm, no obvious SAM, near mid cavity obliteration, EF 58, significant gadolinium uptake in mid, ant and inf septum and ant wall suggesting presence of myofibrillar disarray, small pericardial eff, severe LAE  . Mitral regurgitation   . Morbid obesity ([place])   . Obstructive sleep apnea    a. on CPAP  . Paroxysmal atrial fibrillation ([facility])    a.  previously intolerant of Flecainide and Multaq b. PVI by Dr [name] 09/21/14 c. s/p MAZE Dr Roxy Manns 06/2015  . S/P Maze operation for atrial fibrillation 06/30/2015   Complete bilateral atrial lesion set using bipolar radiofrequency and cryothermy ablation with clipping of LA appendage via conventional median sternotomy    Past Surgical History:  Procedure Laterality Date  . ABLATION  09/21/14   PVI by Dr [name]  . ATRIAL FIBRILLATION ABLATION N/A 09/21/2014   Procedure: ATRIAL FIBRILLATION  ABLATION;  Surgeon: [name], MD;  Location: [facility] CATH LAB;  Service: Cardiovascular;  Laterality: N/A;  . CARDIAC CATHETERIZATION N/A 06/20/2015   Procedure: Right/Left Heart Cath and Coronary Angiography;  Surgeon: Burnell Blanks, MD;  Location: [place] CV LAB;  Service: Cardiovascular;  Laterality: N/A;  . CHOLECYSTECTOMY  08/05/2012   Procedure: LAPAROSCOPIC CHOLECYSTECTOMY WITH INTRAOPERATIVE CHOLANGIOGRAM;  Surgeon: [name], MD,FACS;  Location: [place];  Service: General;  Laterality: N/A;  . CORONARY ARTERY BYPASS GRAFT N/A 06/30/2015   Procedure: CORONARY ARTERY BYPASS GRAFTING (CABG)X2 SEQ LIMA-DIAG-LAD;  Surgeon: Rexene Alberts, MD;  Location: [place];  Service: Open Heart Surgery;  Laterality: N/A;  . ELECTROPHYSIOLOGIC STUDY N/A 01/11/2015   Procedure: Atrial Fibrillation Ablation;  Surgeon: [name], MD;  Location: [place] CV LAB;  Service: Cardiovascular;  Laterality: N/A;  . HAND SURGERY    . LAPAROSCOPIC APPENDECTOMY N/A 02/13/2014   Procedure: APPENDECTOMY LAPAROSCOPIC;  Surgeon: [name], MD;  Location: [facility];  Service: General;  Laterality: N/A;  . MAZE N/A 06/30/2015   Procedure: MAZE;  Surgeon: Rexene Alberts, MD;  Location: [place];  Service: Open Heart Surgery;  Laterality: N/A;  . TEE WITHOUT CARDIOVERSION N/A 09/20/2014   Procedure: TRANSESOPHAGEAL ECHOCARDIOGRAM (TEE);  Surgeon: [name], MD;  Location: [place];  Service: Cardiovascular;  Laterality: N/A;  . TEE WITHOUT CARDIOVERSION N/A 01/10/2015  Procedure: TRANSESOPHAGEAL ECHOCARDIOGRAM (TEE);  Surgeon: Sanda Klein, MD;  Location: [facility] ENDOSCOPY;  Service: Cardiovascular;  Laterality: N/A;  . TEE WITHOUT CARDIOVERSION N/A 06/30/2015   Procedure: TRANSESOPHAGEAL ECHOCARDIOGRAM (TEE);  Surgeon: Rexene Alberts, MD;  Location: [place];  Service: Open Heart Surgery;  Laterality: N/A;  . TONSILLECTOMY    . WISDOM TOOTH EXTRACTION      ROS- all systems are reviewed and negatives  except as per HPI above  Current Outpatient Medications  Medication Sig Dispense Refill  . aspirin EC 81 MG EC tablet Take 1 tablet (81 mg total) by mouth daily.    . carvedilol (COREG) 12.5 MG tablet TAKE 1 TABLET(S) TWICE A DAY BY ORAL ROUTE WITH MEALS FOR 60 DAYS.  3  . cholestyramine (QUESTRAN) 4 g packet TAKE 1 PACKET MIX AND DRINK TWICE A DAY 60 packet 1  . citalopram (CELEXA) 10 MG tablet TAKE 1 TABLET BY MOUTH EVERY DAY 100 tablet 4  . clorazepate (TRANXENE) 7.5 MG tablet Take 1 tablet (7.5 mg total) by mouth at bedtime. 30 tablet 1  . ELIQUIS 5 MG TABS tablet (NO INS) TAKE 1 TABLET BY MOUTH TWICE A DAY 60 tablet 5  . esomeprazole (NEXIUM) 40 MG capsule TAKE 1 CAPSULE DAILY AT 12 NOON 90 capsule 1  . furosemide (LASIX) 40 MG tablet TAKE 1 TABLET (40 MG TOTAL) BY MOUTH DAILY. 30 tablet 6  . guaiFENesin 200 MG tablet Take 200 mg by mouth 2 (two) times daily as needed (for cold).    Marland Kitchen KLOR-CON M20 20 MEQ tablet TAKE 1 TABLET (20 MEQ TOTAL) BY MOUTH DAILY. 30 tablet 6  . lisinopril (PRINIVIL,ZESTRIL) 20 MG tablet Take 1 tablet (20 mg total) by mouth 2 (two) times daily. 180 tablet 3  . simvastatin (ZOCOR) 20 MG tablet Take 1 tablet (20 mg total) by mouth daily at 6 PM. 90 tablet 0  . celecoxib (CELEBREX) 200 MG capsule Take 200 mg by mouth as directed. As needed  0  . DULoxetine (CYMBALTA) 30 MG capsule Take 1-2 capsules by mouth daily.  0   No current facility-administered medications for this visit.     Physical Exam: Vitals:   07/31/17 1002  Weight: 293 lb 9.6 oz (133.2 kg)  Height: 5' 7.5" (1.715 m)    GEN- The patient is well appearing, alert and oriented x 3 today.   Head- normocephalic, atraumatic Eyes-  Sclera clear, conjunctiva pink Ears- hearing intact Oropharynx- clear Lungs- Clear to ausculation bilaterally, normal work of breathing Heart- Regular rate and rhythm, no murmurs, rubs or gallops, PMI not laterally displaced GI- soft, NT, ND, + BS Extremities- no  clubbing, cyanosis, or edema  EKG tracing ordered today is personally reviewed and shows sinus rhythm  Assessment and Plan:  1. Persistent atrial fibrillation Well controlled post MAZE No changes He has AliveCor which he can use if he has palpitations.  I have encouraged him to use this. Bmet, cbc today  2. hypertrophic CM nonobstuctive We discussed 24 hour holter to evaluate for NSVT for further risk stratification.  He declines.  He is clear that he would not be interested in an ICD at this time.  3. Obesity Body mass index is 45.31 kg/m. He has lost 9 lbs since his last visit! Ongoing lifestyle modification is encouraged He did consider bariatric surgery and is not interested.  4. SOB Stable Unclear etiology Will order CPX to further evaluate.  5. OSA He reports compliance with CPAP  6. CAD  No ischemic symptoms No changes  7. Hypertensive cardiovascular disease BP elevated Repeat is 142/84 Weight loss, salt reduction, and avoidance of NSAIDs is encouraged bmet  [name] MD, [facility] 07/31/2017 10:09 AM

## 2017-07-31 NOTE — Patient Instructions (Addendum)
Medication Instructions:  Your physician recommends that you continue on your current medications as directed. Please refer to the Current Medication list given to you today.   Labwork: Your physician recommends that you return for lab work in: BMP/CBC    Testing/Procedures: Your physician has recommended that you have a cardiopulmonary stress test (CPX). CPX testing is a non-invasive measurement of heart and lung function. It replaces a traditional treadmill stress test. This type of test provides a tremendous amount of information that relates not only to your present condition but also for future outcomes. This test combines measurements of you ventilation, respiratory gas exchange in the lungs, electrocardiogram (EKG), blood pressure and physical response before, during, and following an exercise protocol.    Follow-Up: Your physician recommends that you schedule a follow-up appointment in: 4 months with Dr [name]   Any Other Special Instructions Will Be Listed Below (If Applicable).   . Low-Sodium Eating Plan Sodium, which is an element that makes up salt, helps you maintain a healthy balance of fluids in your body. Too much sodium can increase your blood pressure and cause fluid and waste to be held in your body. Your health care provider or dietitian may recommend following this plan if you have high blood pressure (hypertension), kidney disease, liver disease, or heart failure. Eating less sodium can help lower your blood pressure, reduce swelling, and protect your heart, liver, and kidneys. What are tips for following this plan? General guidelines  Most people on this plan should limit their sodium intake to 2,000 mg (milligrams) of sodium each day. Reading food labels  The Nutrition Facts label lists the amount of sodium in one serving of the food. If you eat more than one serving, you must multiply the listed amount of sodium by the number of servings.  Choose foods with  less than 140 mg of sodium per serving.  Avoid foods with 300 mg of sodium or more per serving. Shopping  Look for lower-sodium products, often labeled as "low-sodium" or "no salt added."  Always check the sodium content even if foods are labeled as "unsalted" or "no salt added".  Buy fresh foods. ? Avoid canned foods and premade or frozen meals. ? Avoid canned, cured, or processed meats  Buy breads that have less than 80 mg of sodium per slice. Cooking  Eat more home-cooked food and less restaurant, buffet, and fast food.  Avoid adding salt when cooking. Use salt-free seasonings or herbs instead of table salt or sea salt. Check with your health care provider or pharmacist before using salt substitutes.  Cook with plant-based oils, such as canola, sunflower, or olive oil. Meal planning  When eating at a restaurant, ask that your food be prepared with less salt or no salt, if possible.  Avoid foods that contain MSG (monosodium glutamate). MSG is sometimes added to [place] food, bouillon, and some canned foods. What foods are recommended? The items listed may not be a complete list. Talk with your dietitian about what dietary choices are best for you. Grains Low-sodium cereals, including oats, puffed wheat and rice, and shredded wheat. Low-sodium crackers. Unsalted rice. Unsalted pasta. Low-sodium bread. Whole-grain breads and whole-grain pasta. Vegetables Fresh or frozen vegetables. "No salt added" canned vegetables. "No salt added" tomato sauce and paste. Low-sodium or reduced-sodium tomato and vegetable juice. Fruits Fresh, frozen, or canned fruit. Fruit juice. Meats and other protein foods Fresh or frozen (no salt added) meat, poultry, seafood, and fish. Low-sodium canned tuna and salmon. Unsalted  nuts. Dried peas, beans, and lentils without added salt. Unsalted canned beans. Eggs. Unsalted nut butters. Dairy Milk. Soy milk. Cheese that is naturally low in sodium, such as  ricotta cheese, fresh mozzarella, or Swiss cheese Low-sodium or reduced-sodium cheese. Cream cheese. Yogurt. Fats and oils Unsalted butter. Unsalted margarine with no trans fat. Vegetable oils such as canola or olive oils. Seasonings and other foods Fresh and dried herbs and spices. Salt-free seasonings. Low-sodium mustard and ketchup. Sodium-free salad dressing. Sodium-free light mayonnaise. Fresh or refrigerated horseradish. Lemon juice. Vinegar. Homemade, reduced-sodium, or low-sodium soups. Unsalted popcorn and pretzels. Low-salt or salt-free chips. What foods are not recommended? The items listed may not be a complete list. Talk with your dietitian about what dietary choices are best for you. Grains Instant hot cereals. Bread stuffing, pancake, and biscuit mixes. Croutons. Seasoned rice or pasta mixes. Noodle soup cups. Boxed or frozen macaroni and cheese. Regular salted crackers. Self-rising flour. Vegetables Sauerkraut, pickled vegetables, and relishes. Olives. [place] fries. Onion rings. Regular canned vegetables (not low-sodium or reduced-sodium). Regular canned tomato sauce and paste (not low-sodium or reduced-sodium). Regular tomato and vegetable juice (not low-sodium or reduced-sodium). Frozen vegetables in sauces. Meats and other protein foods Meat or fish that is salted, canned, smoked, spiced, or pickled. Bacon, ham, sausage, hotdogs, corned beef, chipped beef, packaged lunch meats, salt pork, jerky, pickled herring, anchovies, regular canned tuna, sardines, salted nuts. Dairy Processed cheese and cheese spreads. Cheese curds. Blue cheese. Feta cheese. String cheese. Regular cottage cheese. Buttermilk. Canned milk. Fats and oils Salted butter. Regular margarine. Ghee. Bacon fat. Seasonings and other foods Onion salt, garlic salt, seasoned salt, table salt, and sea salt. Canned and packaged gravies. Worcestershire sauce. Tartar sauce. Barbecue sauce. Teriyaki sauce. Soy sauce,  including reduced-sodium. Steak sauce. Fish sauce. Oyster sauce. Cocktail sauce. Horseradish that you find on the shelf. Regular ketchup and mustard. Meat flavorings and tenderizers. Bouillon cubes. Hot sauce and Tabasco sauce. Premade or packaged marinades. Premade or packaged taco seasonings. Relishes. Regular salad dressings. Salsa. Potato and tortilla chips. Corn chips and puffs. Salted popcorn and pretzels. Canned or dried soups. Pizza. Frozen entrees and pot pies. Summary  Eating less sodium can help lower your blood pressure, reduce swelling, and protect your heart, liver, and kidneys.  Most people on this plan should limit their sodium intake to 1,500-2,000 mg (milligrams) of sodium each day.  Canned, boxed, and frozen foods are high in sodium. Restaurant foods, fast foods, and pizza are also very high in sodium. You also get sodium by adding salt to food.  Try to cook at home, eat more fresh fruits and vegetables, and eat less fast food, canned, processed, or prepared foods. This information is not intended to replace advice given to you by your health care provider. Make sure you discuss any questions you have with your health care provider. Document Released: 01/26/2002 Document Revised: 07/30/2016 Document Reviewed: 07/30/2016 Elsevier Interactive Patient Education  2017 [employer].

## 2017-08-05 ENCOUNTER — Encounter (HOSPITAL_COMMUNITY): Payer: 59

## 2017-08-09 ENCOUNTER — Ambulatory Visit (HOSPITAL_COMMUNITY): Payer: 59 | Attending: Internal Medicine

## 2017-08-09 ENCOUNTER — Other Ambulatory Visit (HOSPITAL_COMMUNITY): Payer: Self-pay | Admitting: *Deleted

## 2017-08-09 DIAGNOSIS — R0602 Shortness of breath: Secondary | ICD-10-CM | POA: Insufficient documentation

## 2017-08-09 DIAGNOSIS — Z6841 Body Mass Index (BMI) 40.0 and over, adult: Secondary | ICD-10-CM | POA: Insufficient documentation

## 2017-08-09 DIAGNOSIS — E669 Obesity, unspecified: Secondary | ICD-10-CM | POA: Diagnosis not present

## 2017-08-09 DIAGNOSIS — I481 Persistent atrial fibrillation: Secondary | ICD-10-CM | POA: Diagnosis not present

## 2017-08-12 ENCOUNTER — Telehealth: Payer: Self-pay

## 2017-08-12 NOTE — Telephone Encounter (Signed)
spoke with patient about recent exercise test results. patient verbalized understanding.  patient thanked me for my call.

## 2017-08-12 NOTE — Telephone Encounter (Signed)
-----   Message from [name], MD sent at 08/09/2017 10:26 PM EST ----- Results reviewed.  Claiborne Billings, please inform pt of result. No changes advised at this time.

## 2017-08-31 ENCOUNTER — Other Ambulatory Visit: Payer: Self-pay | Admitting: Internal Medicine

## 2017-11-13 ENCOUNTER — Telehealth: Payer: Self-pay | Admitting: Internal Medicine

## 2017-11-13 NOTE — Telephone Encounter (Signed)
New message  Pt verbalized that he is calling for RN  Pt verbalized that he is due to see cardiologist but referral is in epic for CHF

## 2017-11-13 NOTE — Telephone Encounter (Signed)
Pt called and needs to be set up with his f/up appt with Dr [name]. Per his last OV note, he is to follow up with him in April. There was some confusion with him f/up with [facility], but that appointment was canceled last Sept by Dr [name] per his notes.

## 2017-12-04 ENCOUNTER — Encounter: Payer: Self-pay | Admitting: Internal Medicine

## 2017-12-16 ENCOUNTER — Ambulatory Visit (INDEPENDENT_AMBULATORY_CARE_PROVIDER_SITE_OTHER): Payer: BLUE CROSS/BLUE SHIELD | Admitting: Internal Medicine

## 2017-12-16 ENCOUNTER — Encounter: Payer: Self-pay | Admitting: Internal Medicine

## 2017-12-16 VITALS — BP 132/84 | HR 71 | Ht 67.5 in | Wt 286.0 lb

## 2017-12-16 DIAGNOSIS — I2510 Atherosclerotic heart disease of native coronary artery without angina pectoris: Secondary | ICD-10-CM

## 2017-12-16 DIAGNOSIS — I4819 Other persistent atrial fibrillation: Secondary | ICD-10-CM

## 2017-12-16 DIAGNOSIS — I481 Persistent atrial fibrillation: Secondary | ICD-10-CM | POA: Diagnosis not present

## 2017-12-16 DIAGNOSIS — I422 Other hypertrophic cardiomyopathy: Secondary | ICD-10-CM

## 2017-12-16 DIAGNOSIS — R0602 Shortness of breath: Secondary | ICD-10-CM | POA: Diagnosis not present

## 2017-12-16 NOTE — Patient Instructions (Signed)
Medication Instructions:  Your physician recommends that you continue on your current medications as directed. Please refer to the Current Medication list given to you today.  Labwork: None ordered  Testing/Procedures: None ordered  Follow-Up: Your physician wants you to follow-up in: 6 months with [name] receive a reminder letter in the mail two months in advance. If you don't receive a letter, please call our office to schedule the follow-up appointment.  * If you need a refill on your cardiac medications before your next appointment, please call your pharmacy.   *Please note that any paperwork needing to be filled out by the provider will need to be addressed at the front desk prior to seeing the provider. Please note that any FMLA, disability or other documents regarding health condition is subject to a $25.00 charge that must be received prior to completion of paperwork in the form of a money order or check.  Thank you for choosing [facility]!!

## 2017-12-16 NOTE — Progress Notes (Signed)
PCP: System, Pcp Not In   Primary EP: Dr [name]  Harold Scott is a 57 y.o. male who presents today for routine electrophysiology followup.  Since last being seen in our clinic, the patient reports doing very well. He has lost 14 lbs.  With this, his SOB is improving and edema has resolved.  + rare palpitations. Today, he denies symptoms of chest pain, dizziness, presyncope, or syncope.  The patient is otherwise without complaint today.   Past Medical History:  Diagnosis Date  . Anxiety   . Arthritis   . Coronary artery disease    a. s/p CABG 06/2015 LIMA to D1 and distal LAD, sequentially  . Depression   . Diastolic CHF, chronic ([place])   . Essential hypertension   . Gallstones   . GERD (gastroesophageal reflux disease)   . History of echocardiogram    Echo done in [place] (Dr. Clare Gandy) 3/18: EF 40-45, inf HK, diast dysfn, mild TR, RVSP 35  . History of hiatal hernia   . History of nuclear stress test    Myoview in [place], [place] 3/18: No ischemia, EF 64  . Hyperlipidemia   . Hypertrophic cardiomyopathy ([place])    cMRI 8/18: c/w non-obs resting HOCM, septal thickness 28 mm, no obvious SAM, near mid cavity obliteration, EF 58, significant gadolinium uptake in mid, ant and inf septum and ant wall suggesting presence of myofibrillar disarray, small pericardial eff, severe LAE  . Mitral regurgitation   . Morbid obesity ([place])   . Obstructive sleep apnea    a. on CPAP  . Paroxysmal atrial fibrillation (HCC)    a.  previously intolerant of Flecainide and Multaq b. PVI by Dr [name] 09/21/14 c. s/p MAZE Dr Roxy Manns 06/2015  . S/P Maze operation for atrial fibrillation 06/30/2015   Complete bilateral atrial lesion set using bipolar radiofrequency and cryothermy ablation with clipping of LA appendage via conventional median sternotomy    Past Surgical History:  Procedure Laterality Date  . ABLATION  09/21/14   PVI by Dr [name]  . ATRIAL FIBRILLATION ABLATION N/A 09/21/2014   Procedure:  ATRIAL FIBRILLATION ABLATION;  Surgeon: [name], MD;  [facility] C[facility] LAB;  Service: Cardiovascular;  Laterality: N/A;  . CARDIAC CATHETERIZATION N/A 06/20/2015   Procedure: Right/Left Heart Cath and Coronary Angiography;  Surgeon: Burnell Blanks, MD;  Location: [place] CV LAB;  Service: Cardiovascular;  Laterality: N/A;  . CHOLECYSTECTOMY  08/05/2012   Procedure: LAPAROSCOPIC CHOLECYSTECTOMY WITH INTRAOPERATIVE CHOLANGIOGRAM;  Surgeon: [name], MD,FACS;  Location: [place];  Service: General;  Laterality: N/A;  . CORONARY ARTERY BYPASS GRAFT N/A 06/30/2015   Procedure: CORONARY ARTERY BYPASS GRAFTING (CABG)X2 SEQ LIMA-DIAG-LAD;  Surgeon: Rexene Alberts, MD;  Location: [place];  Service: Open Heart Surgery;  Laterality: N/A;  . ELECTROPHYSIOLOGIC STUDY N/A 01/11/2015   Procedure: Atrial Fibrillation Ablation;  Surgeon: [name], MD;  Location: [place] CV LAB;  Service: Cardiovascular;  Laterality: N/A;  . HAND SURGERY    . LAPAROSCOPIC APPENDECTOMY N/A 02/13/2014   Procedure: APPENDECTOMY LAPAROSCOPIC;  Surgeon: [name], MD;  Location: [facility];  Service: General;  Laterality: N/A;  . MAZE N/A 06/30/2015   Procedure: MAZE;  Surgeon: Rexene Alberts, MD;  Location: [place];  Service: Open Heart Surgery;  Laterality: N/A;  . TEE WITHOUT CARDIOVERSION N/A 09/20/2014   Procedure: TRANSESOPHAGEAL ECHOCARDIOGRAM (TEE);  Surgeon: [name], MD;  Location: [place];  Service: Cardiovascular;  Laterality: N/A;  . TEE WITHOUT CARDIOVERSION N/A 01/10/2015   Procedure: TRANSESOPHAGEAL ECHOCARDIOGRAM (TEE);  Surgeon: Sanda Klein, MD;  Location: [facility] ENDOSCOPY;  Service: Cardiovascular;  Laterality: N/A;  . TEE WITHOUT CARDIOVERSION N/A 06/30/2015   Procedure: TRANSESOPHAGEAL ECHOCARDIOGRAM (TEE);  Surgeon: Rexene Alberts, MD;  Location: [place];  Service: Open Heart Surgery;  Laterality: N/A;  . TONSILLECTOMY    . WISDOM TOOTH EXTRACTION      ROS- all systems are  reviewed and negatives except as per HPI above  Current Outpatient Medications  Medication Sig Dispense Refill  . aspirin EC 81 MG EC tablet Take 1 tablet (81 mg total) by mouth daily.    . carvedilol (COREG) 12.5 MG tablet TAKE 1 TABLET(S) TWICE A DAY BY ORAL ROUTE WITH MEALS FOR 60 DAYS.  3  . celecoxib (CELEBREX) 200 MG capsule Take 200 mg by mouth as directed. As needed  0  . cholestyramine (QUESTRAN) 4 g packet TAKE 1 PACKET MIX AND DRINK TWICE A DAY 60 packet 1  . citalopram (CELEXA) 10 MG tablet TAKE 1 TABLET BY MOUTH EVERY DAY 100 tablet 4  . clorazepate (TRANXENE) 7.5 MG tablet Take 1 tablet (7.5 mg total) by mouth at bedtime. 30 tablet 1  . DULoxetine (CYMBALTA) 30 MG capsule Take 1-2 capsules by mouth daily.  0  . ELIQUIS 5 MG TABS tablet TAKE 1 TABLET BY MOUTH TWICE A DAY 60 tablet 10  . esomeprazole (NEXIUM) 40 MG capsule TAKE 1 CAPSULE DAILY AT 12 NOON 90 capsule 1  . furosemide (LASIX) 40 MG tablet TAKE 1 TABLET (40 MG TOTAL) BY MOUTH DAILY. 30 tablet 6  . guaiFENesin 200 MG tablet Take 200 mg by mouth 2 (two) times daily as needed (for cold).    Marland Kitchen KLOR-CON M20 20 MEQ tablet TAKE 1 TABLET (20 MEQ TOTAL) BY MOUTH DAILY. 30 tablet 6  . simvastatin (ZOCOR) 20 MG tablet Take 1 tablet (20 mg total) by mouth daily at 6 PM. 90 tablet 0  . lisinopril (PRINIVIL,ZESTRIL) 20 MG tablet Take 1 tablet (20 mg total) by mouth 2 (two) times daily. 180 tablet 3   No current facility-administered medications for this visit.     Physical Exam: Vitals:   12/16/17 1518  BP: 132/84  Pulse: 71  SpO2: 98%  Weight: 286 lb (129.7 kg)  Height: 5' 7.5" (1.715 m)    GEN- The patient is overweight appearing, alert and oriented x 3 today.   Head- normocephalic, atraumatic Eyes-  Sclera clear, conjunctiva pink Ears- hearing intact Oropharynx- clear Lungs- Clear to ausculation bilaterally, normal work of breathing Heart- Regular rate and rhythm, no murmurs, rubs or gallops, PMI not laterally  displaced GI- soft, NT, ND, + BS Extremities- no clubbing, cyanosis, or edema  EKG tracing ordered today is personally reviewed and shows sinus rhythm, incomplete RBBB, nonspecific St/T changes  Assessment and Plan:  1. Persistent afib Well controlled post MAZE He has AliveCor which we have reviewed today.  He did have afib 11/11/17.  He also has several misclassified events which appear to be sinus with PACs, though artifact limits interpretation at times. No changes today On eliquis  2. Hypertrophic CM Nonobstructive  3. Obesity Body mass index is 44.13 kg/m.  Wt Readings from Last 3 Encounters:  12/16/17 286 lb (129.7 kg)  07/31/17 293 lb 9.6 oz (133.2 kg)  04/24/17 300 lb (136.1 kg)  lifestyle modification encouraged He has lost 14 lbs!  4. SOB Improving with weight loss  No change required today CPX 08/09/17 reviewed with him today.  Reveals mild functional impairment which was multifactoral.  Obesity was felt to be the primary limiting factor in his exercise tolerance.  5. OSA Compliance with CPAP encouraged  6. CAD No ischemic symptoms No changes On ASA  7. HTN Stable No change required today Weight loss, salt restriction, and avoidance of NSAIDs advised  Return in 6 months  [name] MD, [facility] 12/16/2017 3:50 PM

## 2018-01-28 ENCOUNTER — Other Ambulatory Visit: Payer: Self-pay | Admitting: Family Medicine

## 2018-02-18 ENCOUNTER — Other Ambulatory Visit: Payer: Self-pay | Admitting: *Deleted

## 2018-02-18 MED ORDER — CHOLESTYRAMINE 4 G PO PACK: PACK | ORAL | 1 refills | Status: DC

## 2018-05-19 ENCOUNTER — Encounter: Payer: Self-pay | Admitting: Internal Medicine

## 2018-05-31 ENCOUNTER — Other Ambulatory Visit: Payer: Self-pay | Admitting: Internal Medicine

## 2018-06-02 ENCOUNTER — Telehealth: Payer: Self-pay | Admitting: Internal Medicine

## 2018-06-02 NOTE — Telephone Encounter (Signed)
New Message:   Patient would like for nurse to call him back.,

## 2018-06-02 NOTE — Telephone Encounter (Signed)
Pt has requested refill of medication

## 2018-06-09 ENCOUNTER — Ambulatory Visit (INDEPENDENT_AMBULATORY_CARE_PROVIDER_SITE_OTHER): Payer: BLUE CROSS/BLUE SHIELD | Admitting: Internal Medicine

## 2018-06-09 ENCOUNTER — Encounter: Payer: Self-pay | Admitting: Internal Medicine

## 2018-06-09 ENCOUNTER — Telehealth: Payer: Self-pay | Admitting: Internal Medicine

## 2018-06-09 VITALS — BP 144/88 | HR 66 | Ht 67.0 in | Wt 287.0 lb

## 2018-06-09 DIAGNOSIS — I2510 Atherosclerotic heart disease of native coronary artery without angina pectoris: Secondary | ICD-10-CM

## 2018-06-09 DIAGNOSIS — R0602 Shortness of breath: Secondary | ICD-10-CM

## 2018-06-09 DIAGNOSIS — I422 Other hypertrophic cardiomyopathy: Secondary | ICD-10-CM | POA: Diagnosis not present

## 2018-06-09 DIAGNOSIS — I4819 Other persistent atrial fibrillation: Secondary | ICD-10-CM | POA: Diagnosis not present

## 2018-06-09 DIAGNOSIS — Z23 Encounter for immunization: Secondary | ICD-10-CM

## 2018-06-09 DIAGNOSIS — G4733 Obstructive sleep apnea (adult) (pediatric): Secondary | ICD-10-CM

## 2018-06-09 MED ORDER — DISOPYRAMIDE PHOSPHATE ER 100 MG PO CP12: 200.0000 mg | ORAL_CAPSULE | Freq: Two times a day (BID) | ORAL | 11 refills | Status: DC

## 2018-06-09 NOTE — Telephone Encounter (Signed)
  Pt c/o medication issue:  1. Name of Medication: disopyramide (NORPACE CR) 100 MG 12 hr capsule  2. How are you currently taking this medication (dosage and times per day)? Take 2 capsules (200 mg total) by mouth 2 (two) times daily.  3. Are you having a reaction (difficulty breathing--STAT)?no  4. What is your medication issue? Pharmacist is stating that Norpace interacts with Celexa. Is there something else the patient can take?

## 2018-06-09 NOTE — Telephone Encounter (Signed)
Notified pharmacist ok to fill prescription.  MD aware of current medications

## 2018-06-09 NOTE — Addendum Note (Signed)
Addended by: Rose Phi on: 06/09/2018 04:53 PM   Modules accepted: Orders

## 2018-06-09 NOTE — Patient Instructions (Addendum)
Medication Instructions:  Your physician has recommended you make the following change in your medication:   1.  Norpace extended release 100 mg--- TAKE TWO TABLETS by mouth TWICE a DAY.  Labwork: None ordered.  Testing/Procedures: GET an EKG 12 lead at your primary care physician on June 12, 2018 and have them send results to [name].  Joylene Igo [phone]  Follow-Up: Your physician wants you to follow-up in: 4 weeks with Roderic Palau NP at the afib clinic.    Any Other Special Instructions Will Be Listed Below (If Applicable).  If you need a refill on your cardiac medications before your next appointment, please call your pharmacy.

## 2018-06-09 NOTE — Progress Notes (Signed)
PCP: System, Pcp Not In   Primary EP: Dr [name]  Harold Scott is a 57 y.o. male who presents today for routine electrophysiology followup.  Since last being seen in our clinic, the patient reports doing reasonably well.  He feels that he has palpitations lasting several hours about once per week.  he describes these as "debilitating".  His AliveCor reveals probable atypical atrial flutter at 130 bpm.  SOB is improved.  Today, he denies symptoms of chest pain,  lower extremity edema, dizziness, presyncope, or syncope.  The patient is otherwise without complaint today.   Past Medical History:  Diagnosis Date  . Anxiety   . Arthritis   . Coronary artery disease    a. s/p CABG 06/2015 LIMA to D1 and distal LAD, sequentially  . Depression   . Diastolic CHF, chronic ([place])   . Essential hypertension   . Gallstones   . GERD (gastroesophageal reflux disease)   . History of echocardiogram    Echo done in [place] (Dr. Clare Gandy) 3/18: EF 40-45, inf HK, diast dysfn, mild TR, RVSP 35  . History of hiatal hernia   . History of nuclear stress test    Myoview in [place], [place] 3/18: No ischemia, EF 64  . Hyperlipidemia   . Hypertrophic cardiomyopathy ([place])    cMRI 8/18: c/w non-obs resting HOCM, septal thickness 28 mm, no obvious SAM, near mid cavity obliteration, EF 58, significant gadolinium uptake in mid, ant and inf septum and ant wall suggesting presence of myofibrillar disarray, small pericardial eff, severe LAE  . Mitral regurgitation   . Morbid obesity ([place])   . Obstructive sleep apnea    a. on CPAP  . Paroxysmal atrial fibrillation (HCC)    a.  previously intolerant of Flecainide and Multaq b. PVI by Dr [name] 09/21/14 c. s/p MAZE Dr Roxy Manns 06/2015  . S/P Maze operation for atrial fibrillation 06/30/2015   Complete bilateral atrial lesion set using bipolar radiofrequency and cryothermy ablation with clipping of LA appendage via conventional median sternotomy    Past Surgical  History:  Procedure Laterality Date  . ABLATION  09/21/14   PVI by Dr [name]  . ATRIAL FIBRILLATION ABLATION N/A 09/21/2014   Procedure: ATRIAL FIBRILLATION ABLATION;  Surgeon: [name], MD;  Location: [facility] CATH LAB;  Service: Cardiovascular;  Laterality: N/A;  . CARDIAC CATHETERIZATION N/A 06/20/2015   Procedure: Right/Left Heart Cath and Coronary Angiography;  Surgeon: Burnell Blanks, MD;  Location: [place] CV LAB;  Service: Cardiovascular;  Laterality: N/A;  . CHOLECYSTECTOMY  08/05/2012   Procedure: LAPAROSCOPIC CHOLECYSTECTOMY WITH INTRAOPERATIVE CHOLANGIOGRAM;  Surgeon: [name], MD,FACS;  Location: [place];  Service: General;  Laterality: N/A;  . CORONARY ARTERY BYPASS GRAFT N/A 06/30/2015   Procedure: CORONARY ARTERY BYPASS GRAFTING (CABG)X2 SEQ LIMA-DIAG-LAD;  Surgeon: Rexene Alberts, MD;  Location: [place];  Service: Open Heart Surgery;  Laterality: N/A;  . ELECTROPHYSIOLOGIC STUDY N/A 01/11/2015   Procedure: Atrial Fibrillation Ablation;  Surgeon: [name], MD;  Location: [place] CV LAB;  Service: Cardiovascular;  Laterality: N/A;  . HAND SURGERY    . LAPAROSCOPIC APPENDECTOMY N/A 02/13/2014   Procedure: APPENDECTOMY LAPAROSCOPIC;  Surgeon: [name], MD;  Location: [facility];  Service: General;  Laterality: N/A;  . MAZE N/A 06/30/2015   Procedure: MAZE;  Surgeon: Rexene Alberts, MD;  Location: [place];  Service: Open Heart Surgery;  Laterality: N/A;  . TEE WITHOUT CARDIOVERSION N/A 09/20/2014  Procedure: TRANSESOPHAGEAL ECHOCARDIOGRAM (TEE);  Surgeon: [name], MD;  Location: [place];  Service: Cardiovascular;  Laterality: N/A;  . TEE WITHOUT CARDIOVERSION N/A 01/10/2015   Procedure: TRANSESOPHAGEAL ECHOCARDIOGRAM (TEE);  Surgeon: Sanda Klein, MD;  Location: [facility] ENDOSCOPY;  Service: Cardiovascular;  Laterality: N/A;  . TEE WITHOUT CARDIOVERSION N/A 06/30/2015   Procedure: TRANSESOPHAGEAL ECHOCARDIOGRAM (TEE);  Surgeon: Rexene Alberts, MD;   Location: [place];  Service: Open Heart Surgery;  Laterality: N/A;  . TONSILLECTOMY    . WISDOM TOOTH EXTRACTION      ROS- all systems are reviewed and negatives except as per HPI above  Current Outpatient Medications  Medication Sig Dispense Refill  . aspirin EC 81 MG EC tablet Take 1 tablet (81 mg total) by mouth daily.    . carvedilol (COREG) 12.5 MG tablet TAKE 1 TABLET(S) TWICE A DAY BY ORAL ROUTE WITH MEALS FOR 60 DAYS.  3  . cholestyramine (QUESTRAN) 4 g packet Take 2 g by mouth every other day.    . citalopram (CELEXA) 10 MG tablet TAKE 1 TABLET BY MOUTH EVERY DAY 100 tablet 4  . clorazepate (TRANXENE) 7.5 MG tablet Take 1 tablet (7.5 mg total) by mouth at bedtime. 30 tablet 1  . DULoxetine (CYMBALTA) 30 MG capsule Take 1-2 capsules by mouth daily.  0  . ELIQUIS 5 MG TABS tablet TAKE 1 TABLET BY MOUTH TWICE A DAY 60 tablet 10  . esomeprazole (NEXIUM) 40 MG capsule TAKE 1 CAPSULE DAILY AT 12 NOON 90 capsule 1  . furosemide (LASIX) 40 MG tablet Take 20 mg by mouth daily.    Marland Kitchen guaiFENesin 200 MG tablet Take 200 mg by mouth 2 (two) times daily as needed (for cold).    Marland Kitchen KLOR-CON M20 20 MEQ tablet TAKE 1 TABLET BY MOUTH EVERY DAY 30 tablet 6  . lisinopril (PRINIVIL,ZESTRIL) 30 MG tablet Take 30 mg by mouth 2 (two) times daily.     Marland Kitchen OZEMPIC, 1 MG/DOSE, 2 MG/1.5ML SOPN Inject 1 Dose into the skin once a week.    . simvastatin (ZOCOR) 20 MG tablet Take 1 tablet (20 mg total) by mouth daily at 6 PM. 90 tablet 0   No current facility-administered medications for this visit.     Physical Exam: Vitals:   06/09/18 1210  BP: (!) 144/88  Pulse: 66  SpO2: 97%  Weight: 287 lb (130.2 kg)  Height: 5\' 7"  (1.702 m)    GEN- The patient is overweight appearing, alert and oriented x 3 today.   Head- normocephalic, atraumatic Eyes-  Sclera clear, conjunctiva pink Ears- hearing intact Oropharynx- clear Lungs- Clear to ausculation bilaterally, normal work of breathing Heart- Regular rate and  rhythm, no murmurs, rubs or gallops, PMI not laterally displaced GI- soft, NT, ND, + BS Extremities- no clubbing, cyanosis, or edema  Wt Readings from Last 3 Encounters:  06/09/18 287 lb (130.2 kg)  12/16/17 286 lb (129.7 kg)  07/31/17 293 lb 9.6 oz (133.2 kg)    EKG tracing ordered today is personally reviewed and shows sinus rhythm 66 bpm, PR 160 msec, QRS 118 msec, , incomplete RBBB  Assessment and Plan:  1. Persistent afib/ atypical atrial flutter He has done very well post MAZE I am not very sure as to his exact arrhythmia burden.  I have advised ILR for long term monitoring and afib management post ablation.  He is not yet interested in this device though I think it would be very helpful to determine his overall  arrhythmia burden. We reviewed his AliveCor today.   Continue eliquis We discussed treatment options at length.  I would not advise another ablation for him.  At this time, we discussed AAD options of tikosyn, norpace, and amiodarone.  I would like to avoid amiodarone if able.  He is not interested in hospitalization. I will therefore start norpace CR 200mg  BID.  He will obtain an ecg through his PCP in 3 days and send to my office.  2. Hypertrophic CM Nonobstructive by echo 03/25/17  3. Obesity Body mass index is 44.95 kg/m. His weight has been stable since April  4. OSA Compliance with CPAP encouraged  5. CAD No ischemic symptoms No changes  6. HTN Stable No change required today  7. SOB multifactoral CPX from 08/09/17 is reviewed.  The patient's obesity is felt to be a limiting factor.  Mild diastolic dysfunction is also noted.  Return to see Butch Penny in AF clinic in 4 weeks.  [name] MD, [facility] 06/09/2018 12:43 PM

## 2018-06-16 ENCOUNTER — Ambulatory Visit: Payer: BLUE CROSS/BLUE SHIELD | Admitting: Internal Medicine

## 2018-06-19 ENCOUNTER — Telehealth: Payer: Self-pay | Admitting: Internal Medicine

## 2018-06-19 NOTE — Telephone Encounter (Signed)
° ° °  Pt c/o medication issue:  1. Name of Medication: Tikosyn  2. How are you currently taking this medication (dosage and times per day)? n/a  3. Are you having a reaction (difficulty breathing--STAT)? n/a  4. What is your medication issue? Patient wants to discuss starting Tikosyn

## 2018-06-20 ENCOUNTER — Telehealth: Payer: Self-pay | Admitting: Pharmacist

## 2018-06-20 NOTE — Telephone Encounter (Signed)
Medication list reviewed in anticipation of upcoming Tikosyn initiation. Patient will need to stop his disopyramide 3 days prior to Tikosyn start.  Will need to clarify if pt is taking both citalopram and duloxetine - typically do not use SSRI and SNRI together. Would prefer that pt be on duloxetine if possible as this does not interact with Tikosyn. Citalopram is one of the most QTc prolonging SSRIs and is contraindicated with Tikosyn.  Patient is anticoagulated on Eliquis 5mg  BID on the appropriate dose. Please ensure that patient has not missed any anticoagulation doses in the 3 weeks prior to Tikosyn initiation.   Patient will need to be counseled to avoid use of Benadryl while on Tikosyn and in the 2-3 days prior to Tikosyn initiation.

## 2018-06-20 NOTE — Addendum Note (Signed)
Addended by: Juluis Mire on: 06/20/2018 01:24 PM   Modules accepted: Orders

## 2018-06-20 NOTE — Telephone Encounter (Signed)
Patient states he does not take cymbalta, never started Norpace - both were removed from his list. He will talk with his PCP in regards to changing celexa to a different medication that is not QT prolonging - pt will call back once transitioned to go forward with tikosyn admit as Phyllis Ginger will only cost him $20 a month.

## 2018-06-20 NOTE — Telephone Encounter (Addendum)
Pt will call back after speaking with PCP in regards to changing celexa to different medication to set up tikosyn admit. Tikosyn is much more affordable for patient.

## 2018-06-24 NOTE — Telephone Encounter (Signed)
Pt is off celexa. He will be restarted on Cymbalta after hospitalization is completed and he is back in [place]. Pt set for 11/11 admission.

## 2018-06-24 NOTE — Addendum Note (Signed)
Addended by: Juluis Mire on: 06/24/2018 11:54 AM   Modules accepted: Orders

## 2018-06-30 ENCOUNTER — Other Ambulatory Visit: Payer: Self-pay

## 2018-06-30 ENCOUNTER — Inpatient Hospital Stay (HOSPITAL_COMMUNITY)
Admission: RE | Admit: 2018-06-30 | Discharge: 2018-07-03 | DRG: 309 | Disposition: A | Discharge status: Home / Self Care | Payer: BLUE CROSS/BLUE SHIELD | Attending: Internal Medicine | Admitting: Internal Medicine

## 2018-06-30 ENCOUNTER — Encounter (HOSPITAL_COMMUNITY): Payer: Self-pay | Admitting: Nurse Practitioner

## 2018-06-30 ENCOUNTER — Ambulatory Visit (HOSPITAL_COMMUNITY)
Admission: RE | Admit: 2018-06-30 | Discharge: 2018-06-30 | Disposition: A | Discharge status: Home / Self Care | Payer: BLUE CROSS/BLUE SHIELD | Source: Ambulatory Visit | Attending: Nurse Practitioner | Admitting: Nurse Practitioner

## 2018-06-30 VITALS — BP 122/84 | HR 83 | Ht 67.0 in | Wt 286.0 lb

## 2018-06-30 DIAGNOSIS — Z6841 Body Mass Index (BMI) 40.0 and over, adult: Secondary | ICD-10-CM

## 2018-06-30 DIAGNOSIS — G4733 Obstructive sleep apnea (adult) (pediatric): Secondary | ICD-10-CM | POA: Diagnosis present

## 2018-06-30 DIAGNOSIS — Z79899 Other long term (current) drug therapy: Secondary | ICD-10-CM | POA: Diagnosis not present

## 2018-06-30 DIAGNOSIS — E785 Hyperlipidemia, unspecified: Secondary | ICD-10-CM | POA: Diagnosis present

## 2018-06-30 DIAGNOSIS — I110 Hypertensive heart disease with heart failure: Secondary | ICD-10-CM | POA: Diagnosis present

## 2018-06-30 DIAGNOSIS — Z87891 Personal history of nicotine dependence: Secondary | ICD-10-CM

## 2018-06-30 DIAGNOSIS — I421 Obstructive hypertrophic cardiomyopathy: Secondary | ICD-10-CM | POA: Diagnosis present

## 2018-06-30 DIAGNOSIS — I422 Other hypertrophic cardiomyopathy: Secondary | ICD-10-CM

## 2018-06-30 DIAGNOSIS — Z7982 Long term (current) use of aspirin: Secondary | ICD-10-CM

## 2018-06-30 DIAGNOSIS — I4819 Other persistent atrial fibrillation: Principal | ICD-10-CM

## 2018-06-30 DIAGNOSIS — Z7901 Long term (current) use of anticoagulants: Secondary | ICD-10-CM

## 2018-06-30 DIAGNOSIS — E669 Obesity, unspecified: Secondary | ICD-10-CM | POA: Diagnosis present

## 2018-06-30 DIAGNOSIS — I2510 Atherosclerotic heart disease of native coronary artery without angina pectoris: Secondary | ICD-10-CM | POA: Diagnosis present

## 2018-06-30 DIAGNOSIS — Z5181 Encounter for therapeutic drug level monitoring: Secondary | ICD-10-CM

## 2018-06-30 DIAGNOSIS — I5032 Chronic diastolic (congestive) heart failure: Secondary | ICD-10-CM

## 2018-06-30 DIAGNOSIS — Z951 Presence of aortocoronary bypass graft: Secondary | ICD-10-CM

## 2018-06-30 DIAGNOSIS — I5030 Unspecified diastolic (congestive) heart failure: Secondary | ICD-10-CM | POA: Diagnosis not present

## 2018-06-30 LAB — BASIC METABOLIC PANEL
Anion gap: 9 (ref 5–15)
BUN: 11 mg/dL (ref 6–20)
CALCIUM: 9 mg/dL (ref 8.9–10.3)
CO2: 27 mmol/L (ref 22–32)
CREATININE: 0.92 mg/dL (ref 0.61–1.24)
Chloride: 99 mmol/L (ref 98–111)
GFR calc non Af Amer: >60 mL/min (ref >60)
Glucose, Bld: 133 mg/dL — ABNORMAL HIGH (ref 70–99)
Potassium: 3.9 mmol/L (ref 3.5–5.1)
SODIUM: 135 mmol/L (ref 135–145)

## 2018-06-30 LAB — POTASSIUM: POTASSIUM: 4.2 mmol/L (ref 3.5–5.1)

## 2018-06-30 LAB — GLUCOSE, CAPILLARY: GLUCOSE-CAPILLARY: 99 mg/dL (ref 70–99)

## 2018-06-30 LAB — MAGNESIUM: MAGNESIUM: 2.2 mg/dL (ref 1.7–2.4)

## 2018-06-30 MED ORDER — SEMAGLUTIDE (1 MG/DOSE) 2 MG/1.5ML ~~LOC~~ SOPN: 1.0000 | PEN_INJECTOR | SUBCUTANEOUS | Status: DC

## 2018-06-30 MED ORDER — POTASSIUM CHLORIDE CRYS ER 20 MEQ PO TBCR: 20.0000 meq | EXTENDED_RELEASE_TABLET | Freq: Every day | ORAL | Status: DC

## 2018-06-30 MED ORDER — SODIUM CHLORIDE 0.9% FLUSH: 3.0000 mL | INTRAVENOUS | Status: DC | PRN

## 2018-06-30 MED ORDER — POTASSIUM CHLORIDE CRYS ER 20 MEQ PO TBCR
30.0000 meq | EXTENDED_RELEASE_TABLET | Freq: Once | ORAL | Status: AC
  Administered 2018-06-30: 30 meq via ORAL
  Filled 2018-06-30: qty 1

## 2018-06-30 MED ORDER — DOFETILIDE 500 MCG PO CAPS
500.0000 ug | ORAL_CAPSULE | Freq: Two times a day (BID) | ORAL | Status: DC
  Administered 2018-06-30 – 2018-07-03 (×6): 500 ug via ORAL
  Filled 2018-06-30 (×6): qty 1

## 2018-06-30 MED ORDER — APIXABAN 5 MG PO TABS
5.0000 mg | ORAL_TABLET | Freq: Two times a day (BID) | ORAL | Status: DC
  Administered 2018-06-30 – 2018-07-03 (×6): 5 mg via ORAL
  Filled 2018-06-30 (×6): qty 1

## 2018-06-30 MED ORDER — SIMVASTATIN 20 MG PO TABS
20.0000 mg | ORAL_TABLET | Freq: Every day | ORAL | Status: DC
  Administered 2018-06-30 – 2018-07-02 (×3): 20 mg via ORAL
  Filled 2018-06-30 (×3): qty 1

## 2018-06-30 MED ORDER — ASPIRIN EC 81 MG PO TBEC
81.0000 mg | DELAYED_RELEASE_TABLET | Freq: Every day | ORAL | Status: DC
  Administered 2018-06-30 – 2018-07-03 (×4): 81 mg via ORAL
  Filled 2018-06-30 (×4): qty 1

## 2018-06-30 MED ORDER — CLORAZEPATE DIPOTASSIUM 7.5 MG PO TABS: 7.5000 mg | ORAL_TABLET | Freq: Every evening | ORAL | Status: DC | PRN

## 2018-06-30 MED ORDER — SODIUM CHLORIDE 0.9 % IV SOLN: 250.0000 mL | INTRAVENOUS | Status: DC | PRN

## 2018-06-30 MED ORDER — CLORAZEPATE DIPOTASSIUM 3.75 MG PO TABS: 7.5000 mg | ORAL_TABLET | Freq: Every evening | ORAL | Status: DC | PRN

## 2018-06-30 MED ORDER — SODIUM CHLORIDE 0.9% FLUSH
3.0000 mL | Freq: Two times a day (BID) | INTRAVENOUS | Status: DC
  Administered 2018-06-30 – 2018-07-02 (×5): 3 mL via INTRAVENOUS

## 2018-06-30 MED ORDER — FUROSEMIDE 20 MG PO TABS
20.0000 mg | ORAL_TABLET | Freq: Every morning | ORAL | Status: DC
  Administered 2018-07-01 – 2018-07-03 (×3): 20 mg via ORAL
  Filled 2018-06-30 (×3): qty 1

## 2018-06-30 MED ORDER — CARVEDILOL 12.5 MG PO TABS
12.5000 mg | ORAL_TABLET | Freq: Two times a day (BID) | ORAL | Status: DC
  Administered 2018-06-30 – 2018-07-03 (×6): 12.5 mg via ORAL
  Filled 2018-06-30 (×6): qty 1

## 2018-06-30 MED ORDER — DULOXETINE HCL 60 MG PO CPEP
60.0000 mg | ORAL_CAPSULE | Freq: Every morning | ORAL | Status: DC
  Administered 2018-07-01 – 2018-07-03 (×3): 60 mg via ORAL
  Filled 2018-06-30 (×3): qty 1

## 2018-06-30 MED ORDER — VITAMIN D (ERGOCALCIFEROL) 1.25 MG (50000 UNIT) PO CAPS
50000.0000 [IU] | ORAL_CAPSULE | ORAL | Status: DC
  Administered 2018-07-02: 50000 [IU] via ORAL
  Filled 2018-06-30: qty 1

## 2018-06-30 MED ORDER — PANTOPRAZOLE SODIUM 40 MG PO TBEC
40.0000 mg | DELAYED_RELEASE_TABLET | Freq: Every day | ORAL | Status: DC
  Administered 2018-06-30 – 2018-07-03 (×4): 40 mg via ORAL
  Filled 2018-06-30 (×4): qty 1

## 2018-06-30 MED ORDER — LISINOPRIL 20 MG PO TABS
30.0000 mg | ORAL_TABLET | Freq: Two times a day (BID) | ORAL | Status: DC
  Administered 2018-06-30 – 2018-07-03 (×6): 30 mg via ORAL
  Filled 2018-06-30 (×6): qty 1

## 2018-06-30 NOTE — H&P (Addendum)
Cardiology Admission History and Physical:   Patient ID: Harold Scott MRN: [id number]; DOB: 04-24-1961   Admission date: 06/30/2018  Primary Care Provider: System, Pcp Not In Primary Electrophysiologist:  [name]  Chief Complaint:  Harold Scott admission  Patient Profile:   Harold Scott is a 57 y.o. male with CAD (2016 CABG/MAZE, LAA clipping), HTN, chronic CHF (diastoli), OSA w/CPAP, obesity,  and AFib/flutter (h/o prior PVI ablation x2, and failed AAD w/Flecainide, Multaq, Dofetilide, Amiodarone)  History of Present Illness:   Harold Scott is admitted today for Tikosyn initiation with increasing frequency and worsening tolerability of his AFib.  Compliance with his Eliquis, no missed doses for at least 3 weeks (if ever).  [facility] evaluated med list, recommended he be changed to an alternative to celexa given contraindicated with Tikosyn.    Discussed Tikosyn risks/benefits, and initiation protocol, he has been on Tikosyn in the past, agreeable to proceed.  Past Medical History:  Diagnosis Date  . Anxiety   . Arthritis   . Coronary artery disease    a. s/p CABG 06/2015 LIMA to D1 and distal LAD, sequentially  . Depression   . Diastolic CHF, chronic ([place])   . Essential hypertension   . Gallstones   . GERD (gastroesophageal reflux disease)   . History of echocardiogram    Echo done in [place] (Dr. Clare Gandy) 3/18: EF 40-45, inf HK, diast dysfn, mild TR, RVSP 35  . History of hiatal hernia   . History of nuclear stress test    Myoview in [place], [place] 3/18: No ischemia, EF 64  . Hyperlipidemia   . Hypertrophic cardiomyopathy ([place])    cMRI 8/18: c/w non-obs resting HOCM, septal thickness 28 mm, no obvious SAM, near mid cavity obliteration, EF 58, significant gadolinium uptake in mid, ant and inf septum and ant wall suggesting presence of myofibrillar disarray, small pericardial eff, severe LAE  . Mitral regurgitation   . Morbid obesity ([place])   . Obstructive sleep  apnea    a. on CPAP  . Paroxysmal atrial fibrillation (H[facility])    a.  previously intolerant of Flecainide and Multaq b. PVI by Dr [name] 09/21/14 c. s/p MAZE Dr Roxy Manns 06/2015  . S/P Maze operation for atrial fibrillation 06/30/2015   Complete bilateral atrial lesion set using bipolar radiofrequency and cryothermy ablation with clipping of LA appendage via conventional median sternotomy     Past Surgical History:  Procedure Laterality Date  . ABLATION  09/21/14   PVI by Dr [name]  . ATRIAL FIBRILLATION ABLATION N/A 09/21/2014   Procedure: ATRIAL FIBRILLATION ABLATION;  Surgeon: [name], MD;  Location[facility] CATH LAB;  Service: Cardiovascular;  Laterality: N/A;  . CARDIAC CATHETERIZATION N/A 06/20/2015   Procedure: Right/Left Heart Cath and Coronary Angiography;  Surgeon: Burnell Blanks, MD;  Location: [place] CV LAB;  Service: Cardiovascular;  Laterality: N/A;  . CHOLECYSTECTOMY  08/05/2012   Procedure: LAPAROSCOPIC CHOLECYSTECTOMY WITH INTRAOPERATIVE CHOLANGIOGRAM;  Surgeon: [name], MD,FACS;  Location: [place];  Service: General;  Laterality: N/A;  . CORONARY ARTERY BYPASS GRAFT N/A 06/30/2015   Procedure: CORONARY ARTERY BYPASS GRAFTING (CABG)X2 SEQ LIMA-DIAG-LAD;  Surgeon: Rexene Alberts, MD;  Location: [place];  Service: Open Heart Surgery;  Laterality: N/A;  . ELECTROPHYSIOLOGIC STUDY N/A 01/11/2015   Procedure: Atrial Fibrillation Ablation;  Surgeon: [name], MD;  Location: [place] CV LAB;  Service: Cardiovascular;  Laterality: N/A;  . HAND SURGERY    . LAPAROSCOPIC APPENDECTOMY  N/A 02/13/2014   Procedure: APPENDECTOMY LAPAROSCOPIC;  Surgeon: [name], MD;  Location: [facility] ORS;  Service: General;  Laterality: N/A;  . MAZE N/A 06/30/2015   Procedure: MAZE;  Surgeon: Rexene Alberts, MD;  Location: [place];  Service: Open Heart Surgery;  Laterality: N/A;  . TEE WITHOUT CARDIOVERSION N/A 09/20/2014   Procedure: TRANSESOPHAGEAL ECHOCARDIOGRAM (TEE);  Surgeon: [name], MD;  Location: [place];  Service: Cardiovascular;  Laterality: N/A;  . TEE WITHOUT CARDIOVERSION N/A 01/10/2015   Procedure: TRANSESOPHAGEAL ECHOCARDIOGRAM (TEE);  Surgeon: Sanda Jamilett Ferrante, MD;  Location[facility] ENDOSCOPY;  Service: Cardiovascular;  Laterality: N/A;  . TEE WITHOUT CARDIOVERSION N/A 06/30/2015   Procedure: TRANSESOPHAGEAL ECHOCARDIOGRAM (TEE);  Surgeon: Rexene Alberts, MD;  Location: [place];  Service: Open Heart Surgery;  Laterality: N/A;  . TONSILLECTOMY    . WISDOM TOOTH EXTRACTION       Medications Prior to Admission: Prior to Admission medications   Medication Sig Start Date End Date Taking? Authorizing Provider  aspirin EC 81 MG EC tablet Take 1 tablet (81 mg total) by mouth daily. Patient not taking: Reported on 06/30/2018 07/07/15   Barrett, Erin R, PA-C  carvedilol (COREG) 12.5 MG tablet TAKE 1 TABLET(S) TWICE A DAY BY ORAL ROUTE WITH MEALS FOR 60 DAYS. 02/01/17   [provider]  cholestyramine (QUESTRAN) 4 g packet Take 2 g by mouth as needed.     [provider]  clorazepate (TRANXENE) 7.5 MG tablet Take 1 tablet (7.5 mg total) by mouth at bedtime. Patient taking differently: Take 7.5 mg by mouth at bedtime as needed.  02/04/17   Richardson Dopp T, PA-C  DULoxetine (CYMBALTA) 60 MG capsule Take 60 mg by mouth daily.    [provider]  ELIQUIS 5 MG TABS tablet TAKE 1 TABLET BY MOUTH TWICE A DAY 09/02/17   [name], Jeneen Rinks, MD  esomeprazole (NEXIUM) 20 MG capsule Take 20 mg by mouth daily at 12 noon.    [provider]  furosemide (LASIX) 40 MG tablet Take 20 mg by mouth daily.    [provider]  guaiFENesin 200 MG tablet Take 200 mg by mouth 2 (two) times daily as needed (for cold).    [provider]  KLOR-CON M20 20 MEQ tablet TAKE 1 TABLET BY MOUTH EVERY DAY 06/02/18   [name], Jeneen Rinks, MD  lisinopril (PRINIVIL,ZESTRIL) 30 MG tablet Take 30 mg by mouth 2 (two) times daily.     [provider]  OZEMPIC,  1 MG/DOSE, 2 MG/1.5ML SOPN Inject 1 Dose into the skin once a week. 04/23/18   [provider]  simvastatin (ZOCOR) 20 MG tablet Take 1 tablet (20 mg total) by mouth daily at 6 PM. 07/26/16   [name], Jeneen Rinks, MD  Vitamin D, Ergocalciferol, (DRISDOL) 1.25 MG (50000 UT) CAPS capsule Take 50,000 Units by mouth every 7 (seven) days.    [provider]     Allergies:    Allergies  Allergen Reactions  . Flecainide Shortness Of Breath and Other (See Comments)    EXTREME SOB  . Multaq [Dronedarone] Shortness Of Breath  . Toprol Xl [Metoprolol Tartrate] Shortness Of Breath  . Penicillins Hives    Has patient had a PCN reaction causing immediate rash, facial/tongue/throat swelling, SOB or lightheadedness with hypotension:unsure Has patient had a PCN reaction causing severe rash involving mucus membranes or skin necrosis:unsure Has patient had a PCN reaction that required hospitalization:No Has patient had a PCN reaction occurring within the  last 10 years:No If all of the above answers are "NO", then may proceed with Cephalosporin use.     Social History:   Social History   Socioeconomic History  . Marital status: Married    Spouse name: Not on file  . Number of children: 1  . Years of education: Not on file  . Highest education level: Not on file  Occupational History  . Occupation: Radio broadcast assistant: [employer]  Social Needs  . Financial resource strain: Not on file  . Food insecurity:    Worry: Not on file    Inability: Not on file  . Transportation needs:    Medical: Not on file    Non-medical: Not on file  Tobacco Use  . Smoking status: Former Smoker    Packs/day: 0.50    Years: 30.00    Pack years: 15.00    Types: Cigarettes    Last attempt to quit: 07/23/2006    Years since quitting: 11.9  . Smokeless tobacco: Never Used  Substance and Sexual Activity  . Alcohol use: Yes    Alcohol/week: 7.0 - 14.0 standard drinks    Types: 7 - 14 Standard drinks or equivalent  per week    Comment: social  . Drug use: No  . Sexual activity: Not on file  Lifestyle  . Physical activity:    Days per week: Not on file    Minutes per session: Not on file  . Stress: Not on file  Relationships  . Social connections:    Talks on phone: Not on file    Gets together: Not on file    Attends religious service: Not on file    Active member of club or organization: Not on file    Attends meetings of clubs or organizations: Not on file    Relationship status: Not on file  . Intimate partner violence:    Fear of current or ex partner: Not on file    Emotionally abused: Not on file    Physically abused: Not on file    Forced sexual activity: Not on file  Other Topics Concern  . Not on file  Social History Narrative   Pt works as a Secondary school teacher    Family History:  The patient's family history includes Hypertrophic cardiomyopathy in his son; Other in his unknown relative. He was adopted.    ROS:  Please see the history of present illness.  All other ROS reviewed and negative.     Physical Exam/Data:   Vitals:   06/30/18 1152  BP: 129/87  Pulse: 75  Resp: 20  Temp: (!) 97.5 F (36.4 C)  TempSrc: Oral  SpO2: 99%  Weight: 129.3 kg  Height: 5\' 8"  (1.727 m)   No intake or output data in the 24 hours ending 06/30/18 1314 Filed Weights   06/30/18 1152  Weight: 129.3 kg   Body mass index is 43.34 kg/m.  General:  Well nourished, well developed, in no acute distress HEENT: normal Lymph: no adenopathy Neck: no JVD Endocrine:  No thryomegaly Vascular: No carotid bruits Cardiac:  RRR; soft SM, no gallops or rubs Lungs:  CTA b/l, no wheezing, rhonchi or rales  Abd: soft, nontender, obese Ext: no edema Musculoskeletal:  No deformities Skin: warm and dry  Neuro:  No gross focal abnormalities noted Psych:  Normal affect    EKG:  The ECG that was done today was personally reviewed and demonstrates SR 83bpm, QTc 440ms  Relevant CV Studies:  08/09/17;  CPX Exercise testing with gas exchange shows a mild functional impairment which is multifactorial in nature. However when corrected for his ideal body weight, his functional capacity is normal suggesting that the patient's obesity is likely the primary limiting factor in his exercise intolerance. At peak exercise, patient reached his ventilatory limits - again suggesting that the patient's obesity and related restrictive ventilation is major limiting factor. That said, the elevated VE/VCO2 in the setting of hypertensive response to exercise is suggestive of mild diastolic dysfunction.   04/11/17; Cardiac MRI IMPRESSION: 1) Findings consistent with form of non obstructive resting HOCM Septal thickness 28 mm Given extent of thickening should have further risk stratification to possibly include sAECG and EP consult 2) Small LV cavity size with near mid cavity obliteration no obvious SAM EF 58% 3) Significant gadolinium uptake in the mid, anterior and inferior septum as well as mid anterior wall suggesting presence of myofibrillar disarray 4)  Small pericardial effusion 5) Severe LAE   03/25/17: TTE Study Conclusions - Left ventricle: The cavity size was normal. There was severe   focal basal and moderate concentric hypertrophy. Systolic   function was normal. The estimated ejection fraction was in the   range of 60% to 65%. Wall motion was normal; there were no   regional wall motion abnormalities. There was a reduced   contribution of atrial contraction to ventricular filling, due to   increased ventricular diastolic pressure or atrial contractile   dysfunction. Doppler parameters are consistent with a reversible   restrictive pattern, indicative of decreased left ventricular   diastolic compliance and/or increased left atrial pressure (grade   3 diastolic dysfunction). - Aortic valve: Trileaflet; mildly thickened, mildly calcified   leaflets. - Aorta: Aortic root dimension: 38 mm (ED). -  Aortic root: The aortic root was mildly dilated. - Left atrium: The atrium was moderately dilated. - Tricuspid valve: There was trivial regurgitation.  - Pulmonic valve: There was trivial regurgitation. - Pulmonary arteries: Systolic pressure could not be accurately   estimated. - Recommendations: Recommend Cardiac MRI for further evaluation of   HOCM. Impressions: - There is marked hypertrophy of the IVS. Cannot visualize whether   there is SAM but no evidence of LVOT gradient Recommendations:  Recommend Cardiac MRI for further evaluation of HOCM.  Laboratory Data:  Chemistry Recent Labs  Lab 06/30/18 1115  NA 135  K 3.9  CL 99  CO2 27  GLUCOSE 133*  BUN 11  CREATININE 0.92  CALCIUM 9.0  GFRNONAA >60  GFRAA >60  ANIONGAP 9    No results for input(s): PROT, ALBUMIN, AST, ALT, ALKPHOS, BILITOT in the last 168 hours. HematologyNo results for input(s): WBC, RBC, HGB, HCT, MCV, MCH, MCHC, RDW, PLT in the last 168 hours. Cardiac EnzymesNo results for input(s): TROPONINI in the last 168 hours. No results for input(s): TROPIPOC in the last 168 hours.  BNPNo results for input(s): BNP, PROBNP in the last 168 hours.  DDimer No results for input(s): DDIMER in the last 168 hours.  Radiology/Studies:  No results found.  Assessment and Plan:   1. Persistent AFib, admit for Tikosyn     CHA2DS2Vasc is 3, on eliquis     K+ 3.9 (replacement ordered)     Mag 2.2     Creat 0/92 (calc CrCl is 164)     QTc, EKG is reviewed by [name], OK to start, patient has been on Tikosyn historically and did well with it, will follow QT closely  2. Hypertrophic CM     (non-obstructive) 3. Chronic CHF (diastolic)     No exam findings to suggest fluid OL     Continue home meds  3. CAD     No anginal symptoms     Exercising/walking regularly     Continue home meds  4. HTN     Continue home meds  5. OSA w/CPAP     brought his home CPAP     For questions or updates, please contact  [place] Please consult [email or web address] for contact info under        Signed, Baldwin Jamaica, PA-C  06/30/2018 1:14 PM    Atrial fibrillation-persistent  Atrial fibrillation-ablation x2 status post maze  Coronary artery disease with prior bypass  Hypertrophic nonobstructive cardiomyopathy; previously abnormal more recently normal left ventricular function; septal wall thickness 20 mm  Sleep apnea-treated  QTC borderline long      Patient admitted with recurrent symptomatic atrial fibrillation.  Previously he has been on dofetilide; he is admitted now for reinitiation.  QTc exceeds 460 ms; however, Dr. Greggory Brandy has reviewed these data and with his prior history of having tolerated dofetilide has recommended reinitiation with very close attention to QT prolongation.  I reviewed with the patient the issues related to pro arrhythmia  Currently is in sinus rhythm.  With a history of previously identified low left ventricular function recommend repeat echocardiographic assessment; LVEF of less than 50% in this patient cohort is considered high risk.

## 2018-06-30 NOTE — Care Management (Signed)
#   6.  S/W AMANDA @ [facility] RX # [phone]  1.TIKOSYN   125 MCG BID [place]   #  [phone]  2. TIKOSYN  250 MCG BID COVER- YES CO-PAY- ZERO DOLLARS TIER- NO PRIOR APPROVAL- NO  3. TIKOSYN 500 MCG BID COVER- YES CO-PAY- $ 50.00 TIER- NO PRIOR APPROVAL- NO  4. DOFETILIDE  125 MCG  250 MCG  500 MCG BID COVER- YES CO-PAY- ZERO DOLLARS FOR EACH PRESCRIPTION TIER- NO PRIOR APPROVAL- NO  PREFERRED PHARMACY : [facility]

## 2018-06-30 NOTE — Progress Notes (Signed)
Pharmacy Review for Dofetilide (Tikosyn) Initiation  Admit Complaint: 57 y.o. male admitted 06/30/2018 with atrial fibrillation to be initiated on dofetilide.   Assessment:  Patient Exclusion Criteria: If any screening criteria checked as "Yes", then  patient  should NOT receive dofetilide until criteria item is corrected. If "Yes" please indicate correction plan.  YES  NO Patient  Exclusion Criteria Correction Plan  []  [x]  Baseline QTc interval is greater than or equal to 440 msec. IF above YES box checked dofetilide contraindicated unless patient has ICD; then may proceed if QTc 500-550 msec or with known ventricular conduction abnormalities may proceed with QTc 550-600 msec. QTc = 0.46 - OK per EP   []  [x]  Magnesium level is less than 1.8 mEq/l : Last magnesium:  Lab Results  Component Value Date   MG 2.2 06/30/2018         []  [x]  Potassium level is less than 4 mEq/l : Last potassium:  Lab Results  Component Value Date   K 3.9 06/30/2018        KCL 30MEQ ordered  []  [x]  Patient is known or suspected to have a digoxin level greater than 2 ng/ml: No results found for: DIGOXIN    []  [x]  Creatinine clearance less than 20 ml/min (calculated using Cockcroft-Gault, actual body weight and serum creatinine): Estimated Creatinine Clearance: 117.7 mL/min (by C-G formula based on SCr of 0.92 mg/dL).    []  [x]  Patient has received drugs known to prolong the QT intervals within the last 48 hours (phenothiazines, tricyclics or tetracyclic antidepressants, erythromycin, H-1 antihistamines, cisapride, fluoroquinolones, azithromycin). Drugs not listed above may have an, as yet, undetected potential to prolong the QT interval, updated information on QT prolonging agents is available at this website:QT prolonging agents   []  [x]  Patient received a dose of hydrochlorothiazide (Oretic) alone or in any combination including triamterene (Dyazide, Maxzide) in the last 48 hours.   []  [x]  Patient received  a medication known to increase dofetilide plasma concentrations prior to initial dofetilide dose:  . Trimethoprim (Primsol, Proloprim) in the last 36 hours . Verapamil (Calan, Verelan) in the last 36 hours or a sustained release dose in the last 72 hours . Megestrol (Megace) in the last 5 days  . Cimetidine (Tagamet) in the last 6 hours . Ketoconazole (Nizoral) in the last 24 hours . Itraconazole (Sporanox) in the last 48 hours  . Prochlorperazine (Compazine) in the last 36 hours    []  [x]  Patient is known to have a history of torsades de pointes; congenital or acquired long QT syndromes.   []  [x]  Patient has received a Class 1 antiarrhythmic with less than 2 half-lives since last dose. (Disopyramide, Quinidine, Procainamide, Lidocaine, Mexiletine, Flecainide, Propafenone)   []  [x]  Patient has received amiodarone therapy in the past 3 months or amiodarone level is greater than 0.3 ng/ml.    Patient has been appropriately anticoagulated with apixaban 5mg  BID. Ordering provider was confirmed at [email or web address] if they are not listed on the [place] Prescribers list.  Goal of Therapy: Follow renal function, electrolytes, potential drug interactions, and dose adjustment. Provide education and 1 week supply at discharge.  Plan:  [x]   Physician selected initial dose within range recommended for patients level of renal function - will monitor for response.  []   Physician selected initial dose outside of range recommended for patients level of renal function - will discuss if the dose should be altered at this time.   Select One Calculated CrCl  Dose q12h  [  x] > 60 ml/min 500 mcg  []  40-60 ml/min 250 mcg  []  20-40 ml/min 125 mcg   2. Follow up QTc after the first 5 doses, renal function, electrolytes (K & Mg) daily x 3     days, dose adjustment, success of initiation and facilitate 1 week discharge supply as     clinically indicated.   Bonnita Nasuti Pharm.D. CPP, BCPS Clinical  Pharmacist [phone] 06/30/2018 1:36 PM

## 2018-06-30 NOTE — Progress Notes (Signed)
Primary Care Physician: System, Pcp Not In Referring Physician: [name] is a 57 y.o. male with a h/o HOCM, CAD afib, sp ablation x 2 and Maze  And  by pass by Dr. Roxy Scott in 06/2015. He recently saw Dr. Rayann Scott  and was c/o of intermittent arrhythmia's. It was decided to admit for Tikosyn. He had Tikosyn in the past, did not work too well then, but the hope is now since the Maze procedure, it may be more effective. He has not had any benadryl, no uninterrupted anticoagulation. He was on Celexa, which is qtc prolonging. He is now off drug and on Cymbalta since last Thursday. He now lives in [place].   Today, he denies symptoms of palpitations, chest pain, shortness of breath, orthopnea, PND, lower extremity edema, dizziness, presyncope, syncope, or neurologic sequela. The patient is tolerating medications without difficulties and is otherwise without complaint today.   Past Medical History:  Diagnosis Date  . Anxiety   . Arthritis   . Coronary artery disease    a. s/p CABG 06/2015 LIMA to D1 and distal LAD, sequentially  . Depression   . Diastolic CHF, chronic ([place])   . Essential hypertension   . Gallstones   . GERD (gastroesophageal reflux disease)   . History of echocardiogram    Echo done in [place] (Dr. Clare Scott) 3/18: EF 40-45, inf HK, diast dysfn, mild TR, RVSP 35  . History of hiatal hernia   . History of nuclear stress test    Myoview in [place], [place] 3/18: No ischemia, EF 64  . Hyperlipidemia   . Hypertrophic cardiomyopathy ([place])    cMRI 8/18: c/w non-obs resting HOCM, septal thickness 28 mm, no obvious SAM, near mid cavity obliteration, EF 58, significant gadolinium uptake in mid, ant and inf septum and ant wall suggesting presence of myofibrillar disarray, small pericardial eff, severe LAE  . Mitral regurgitation   . Morbid obesity ([place])   . Obstructive sleep apnea    a. on CPAP  . Paroxysmal atrial fibrillation ([facility])    a.  previously  intolerant of Flecainide and Multaq b. PVI by Dr Harold Scott 09/21/14 c. s/p MAZE Dr Harold Scott 06/2015  . S/P Maze operation for atrial fibrillation 06/30/2015   Complete bilateral atrial lesion set using bipolar radiofrequency and cryothermy ablation with clipping of LA appendage via conventional median sternotomy    Past Surgical History:  Procedure Laterality Date  . ABLATION  09/21/14   PVI by Dr Harold Scott  . ATRIAL FIBRILLATION ABLATION N/A 09/21/2014   Procedure: ATRIAL FIBRILLATION ABLATION;  Surgeon: Harold Grayer, MD;  Location[facility] CATH LAB;  Service: Cardiovascular;  Laterality: N/A;  . CARDIAC CATHETERIZATION N/A 06/20/2015   Procedure: Right/Left Heart Cath and Coronary Angiography;  Surgeon: Harold Blanks, MD;  Location: [facility] CV LAB;  Service: Cardiovascular;  Laterality: N/A;  . CHOLECYSTECTOMY  08/05/2012   Procedure: LAPAROSCOPIC CHOLECYSTECTOMY WITH INTRAOPERATIVE CHOLANGIOGRAM;  Surgeon: Harold Curry, MD,FACS;  Location: [facility];  Service: General;  Laterality: N/A;  . CORONARY ARTERY BYPASS GRAFT N/A 06/30/2015   Procedure: CORONARY ARTERY BYPASS GRAFTING (CABG)X2 SEQ LIMA-DIAG-LAD;  Surgeon: Harold Alberts, MD;  Location: [facility];  Service: Open Heart Surgery;  Laterality: N/A;  . ELECTROPHYSIOLOGIC STUDY N/A 01/11/2015   Procedure: Atrial Fibrillation Ablation;  Surgeon: Harold Grayer, MD;  Location: [facility] CV LAB;  Service: Cardiovascular;  Laterality: N/A;  . HAND SURGERY    . LAPAROSCOPIC  APPENDECTOMY N/A 02/13/2014   Procedure: APPENDECTOMY LAPAROSCOPIC;  Surgeon: Harold Roof, MD;  Location: [facility];  Service: General;  Laterality: N/A;  . MAZE N/A 06/30/2015   Procedure: MAZE;  Surgeon: Harold Alberts, MD;  Location: [facility];  Service: Open Heart Surgery;  Laterality: N/A;  . TEE WITHOUT CARDIOVERSION N/A 09/20/2014   Procedure: TRANSESOPHAGEAL ECHOCARDIOGRAM (TEE);  Surgeon: Harold Headings, MD;  Location: [facility];  Service: Cardiovascular;  Laterality: N/A;  .  TEE WITHOUT CARDIOVERSION N/A 01/10/2015   Procedure: TRANSESOPHAGEAL ECHOCARDIOGRAM (TEE);  Surgeon: Harold Klein, MD;  Location: [facility] ENDOSCOPY;  Service: Cardiovascular;  Laterality: N/A;  . TEE WITHOUT CARDIOVERSION N/A 06/30/2015   Procedure: TRANSESOPHAGEAL ECHOCARDIOGRAM (TEE);  Surgeon: Harold Alberts, MD;  Location: [facility];  Service: Open Heart Surgery;  Laterality: N/A;  . TONSILLECTOMY    . WISDOM TOOTH EXTRACTION      Current Outpatient Medications  Medication Sig Dispense Refill  . carvedilol (COREG) 12.5 MG tablet TAKE 1 TABLET(S) TWICE A DAY BY ORAL ROUTE WITH MEALS FOR 60 DAYS.  3  . cholestyramine (QUESTRAN) 4 g packet Take 2 g by mouth as needed.     . clorazepate (TRANXENE) 7.5 MG tablet Take 1 tablet (7.5 mg total) by mouth at bedtime. (Patient taking differently: Take 7.5 mg by mouth at bedtime as needed. ) 30 tablet 1  . DULoxetine (CYMBALTA) 60 MG capsule Take 60 mg by mouth daily.    Marland Kitchen ELIQUIS 5 MG TABS tablet TAKE 1 TABLET BY MOUTH TWICE A DAY 60 tablet 10  . esomeprazole (NEXIUM) 20 MG capsule Take 20 mg by mouth daily at 12 noon.    . furosemide (LASIX) 40 MG tablet Take 20 mg by mouth daily.    Marland Kitchen KLOR-CON M20 20 MEQ tablet TAKE 1 TABLET BY MOUTH EVERY DAY 30 tablet 6  . lisinopril (PRINIVIL,ZESTRIL) 30 MG tablet Take 30 mg by mouth 2 (two) times daily.     Marland Kitchen OZEMPIC, 1 MG/DOSE, 2 MG/1.5ML SOPN Inject 1 Dose into the skin once a week.    . simvastatin (ZOCOR) 20 MG tablet Take 1 tablet (20 mg total) by mouth daily at 6 PM. 90 tablet 0  . Vitamin D, Ergocalciferol, (DRISDOL) 1.25 MG (50000 UT) CAPS capsule Take 50,000 Units by mouth every 7 (seven) days.    Marland Kitchen aspirin EC 81 MG EC tablet Take 1 tablet (81 mg total) by mouth daily. (Patient not taking: Reported on 06/30/2018)    . guaiFENesin 200 MG tablet Take 200 mg by mouth 2 (two) times daily as needed (for cold).     No current facility-administered medications for this encounter.     Allergies  Allergen  Reactions  . Flecainide Shortness Of Breath and Other (See Comments)    EXTREME SOB  . Multaq [Dronedarone] Shortness Of Breath  . Apixaban   . Toprol Xl  [Metoprolol Tartrate]   . Penicillins Hives    Has patient had a PCN reaction causing immediate rash, facial/tongue/throat swelling, SOB or lightheadedness with hypotension:unsure Has patient had a PCN reaction causing severe rash involving mucus membranes or skin necrosis:unsure Has patient had a PCN reaction that required hospitalization:No Has patient had a PCN reaction occurring within the last 10 years:No If all of the above answers are "NO", then may proceed with Cephalosporin use.     Social History   Socioeconomic History  . Marital status: Married    Spouse name: Not on file  . Number  of children: 1  . Years of education: Not on file  . Highest education level: Not on file  Occupational History  . Occupation: Radio broadcast assistant: [employer]  Social Needs  . Financial resource strain: Not on file  . Food insecurity:    Worry: Not on file    Inability: Not on file  . Transportation needs:    Medical: Not on file    Non-medical: Not on file  Tobacco Use  . Smoking status: Former Smoker    Packs/day: 0.50    Years: 30.00    Pack years: 15.00    Types: Cigarettes    Last attempt to quit: 07/23/2006    Years since quitting: 11.9  . Smokeless tobacco: Never Used  Substance and Sexual Activity  . Alcohol use: Yes    Alcohol/week: 7.0 - 14.0 standard drinks    Types: 7 - 14 Standard drinks or equivalent per week    Comment: social  . Drug use: No  . Sexual activity: Not on file  Lifestyle  . Physical activity:    Days per week: Not on file    Minutes per session: Not on file  . Stress: Not on file  Relationships  . Social connections:    Talks on phone: Not on file    Gets together: Not on file    Attends religious service: Not on file    Active member of club or organization: Not on file    Attends meetings of  clubs or organizations: Not on file    Relationship status: Not on file  . Intimate partner violence:    Fear of current or ex partner: Not on file    Emotionally abused: Not on file    Physically abused: Not on file    Forced sexual activity: Not on file  Other Topics Concern  . Not on file  Social History Narrative   Pt works as a Secondary school teacher    Family History  Adopted: Yes  Problem Relation Age of Onset  . Other Unknown        ADOPTED  . Hypertrophic cardiomyopathy Son        son had septal myomectomy at age 57    ROS- All systems are reviewed and negative except as per the HPI above  Physical Exam: Vitals:   06/30/18 1044  BP: 122/84  Pulse: 83  Weight: 129.7 kg  Height: 5\' 7"  (1.702 m)   Wt Readings from Last 3 Encounters:  06/30/18 129.7 kg  06/09/18 130.2 kg  12/16/17 129.7 kg    Labs: Lab Results  Component Value Date   NA 138 07/31/2017   K 4.3 07/31/2017   CL 96 07/31/2017   CO2 27 07/31/2017   GLUCOSE 167 (H) 07/31/2017   BUN 10 07/31/2017   CREATININE 0.84 07/31/2017   CALCIUM 9.2 07/31/2017   MG 2.2 07/01/2015   Lab Results  Component Value Date   INR 1.37 06/30/2015   Lab Results  Component Value Date   CHOL 228 (H) 07/19/2014   HDL 30 (L) 07/19/2014   LDLCALC 130 (H) 07/19/2014   TRIG 338 (H) 07/19/2014     GEN- The patient is well appearing, alert and oriented x 3 today.   Head- normocephalic, atraumatic Eyes-  Sclera clear, conjunctiva pink Ears- hearing intact Oropharynx- clear Neck- supple, no JVP Lymph- no cervical lymphadenopathy Lungs- Clear to ausculation bilaterally, normal work of breathing Heart- Regular rate and rhythm, no murmurs, rubs or gallops,  PMI not laterally displaced GI- soft, NT, ND, + BS Extremities- no clubbing, cyanosis, or edema MS- no significant deformity or atrophy Skin- no rash or lesion Psych- euthymic mood, full affect Neuro- strength and sensation are intact  EKG-NSR at 83 bpm, PR int 166  ms, qrs int 108 ms, qtc 479 ms Epic records reviewed    Assessment and Plan: 1.Afib  To be admitted for tikosyn today per Dr. Rayann Scott Can afford drug No interruption in DOAC No benadryl use Now off celexa Bmet/mag today  To 6E as bed is ready  Butch Penny C. Daquann Merriott, [facility] [address] [place], [place] [address] [phone]

## 2018-06-30 NOTE — Progress Notes (Signed)
Pt has home unit and places self on/off machine.  RT filled water chamber as requested by pt. RT will monitor.

## 2018-07-01 ENCOUNTER — Other Ambulatory Visit (HOSPITAL_COMMUNITY): Payer: BLUE CROSS/BLUE SHIELD

## 2018-07-01 DIAGNOSIS — I421 Obstructive hypertrophic cardiomyopathy: Secondary | ICD-10-CM

## 2018-07-01 LAB — BASIC METABOLIC PANEL
Anion gap: 3 — ABNORMAL LOW (ref 5–15)
BUN: 11 mg/dL (ref 6–20)
CALCIUM: 9.1 mg/dL (ref 8.9–10.3)
CHLORIDE: 102 mmol/L (ref 98–111)
CO2: 31 mmol/L (ref 22–32)
CREATININE: 1.06 mg/dL (ref 0.61–1.24)
Glucose, Bld: 123 mg/dL — ABNORMAL HIGH (ref 70–99)
Potassium: 4.8 mmol/L (ref 3.5–5.1)
SODIUM: 136 mmol/L (ref 135–145)

## 2018-07-01 LAB — MAGNESIUM: Magnesium: 2 mg/dL (ref 1.7–2.4)

## 2018-07-01 MED ORDER — OFF THE BEAT BOOK
Freq: Once | Status: AC
  Administered 2018-07-01: 07:00:00
  Filled 2018-07-01: qty 1

## 2018-07-01 MED ORDER — POTASSIUM CHLORIDE CRYS ER 20 MEQ PO TBCR
20.0000 meq | EXTENDED_RELEASE_TABLET | Freq: Every day | ORAL | Status: DC
  Administered 2018-07-02 – 2018-07-03 (×2): 20 meq via ORAL
  Filled 2018-07-01 (×3): qty 1

## 2018-07-01 NOTE — Progress Notes (Addendum)
Progress Note  Patient Name: Harold Scott Date of Encounter: 07/01/2018  Primary Cardiologist: [name]  Subjective   No complaints  Inpatient Medications    Scheduled Meds: . apixaban  5 mg Oral BID  . aspirin EC  81 mg Oral Daily  . carvedilol  12.5 mg Oral BID WC  . dofetilide  500 mcg Oral BID  . DULoxetine  60 mg Oral q morning - 10a  . furosemide  20 mg Oral q morning - 10a  . lisinopril  30 mg Oral BID  . pantoprazole  40 mg Oral Daily  . potassium chloride SA  20 mEq Oral Daily  . [START ON 07/02/2018] Semaglutide (1 MG/DOSE)  1 Dose Subcutaneous Q Wed  . simvastatin  20 mg Oral q1800  . sodium chloride flush  3 mL Intravenous Q12H  . [START ON 07/02/2018] Vitamin D (Ergocalciferol)  50,000 Units Oral Q Wed   Continuous Infusions: . sodium chloride     PRN Meds: sodium chloride, clorazepate, sodium chloride flush   Vital Signs    Vitals:   06/30/18 1152 06/30/18 2003 07/01/18 0437 07/01/18 0438  BP: 129/87 130/84 (!) 129/59   Pulse: 75 80 71   Resp: 20 17  17   Temp: (!) 97.5 F (36.4 C) 98.7 F (37.1 C) 97.9 F (36.6 C)   TempSrc: Oral Oral Oral   SpO2: 99% 98% 97%   Weight: 129.3 kg   129.2 kg  Height: 5\' 8"  (1.727 m)       Intake/Output Summary (Last 24 hours) at 07/01/2018 0729 Last data filed at 07/01/2018 0700 Gross per 24 hour  Intake 465 ml  Output 450 ml  Net 15 ml   Filed Weights   06/30/18 1152 07/01/18 0438  Weight: 129.3 kg 129.2 kg    Telemetry    SR, one 3beat NSVT (MM) - Personally Reviewed  ECG    SR, 71bpm, QTc 471ms - Personally Reviewed  Physical Exam   GEN: No acute distress.   Neck: No JVD Cardiac: RRR, no murmurs, rubs, or gallops.  Respiratory: CTA b/l. GI: Soft, nontender, non-distended  MS: No edema; No deformity. Neuro:  Nonfocal  Psych: Normal affect   Labs    Chemistry Recent Labs  Lab 06/30/18 1115 06/30/18 1431 07/01/18 0415  NA 135  --  136  K 3.9 4.2 4.8  CL 99  --  102  CO2 27   --  31  GLUCOSE 133*  --  123*  BUN 11  --  11  CREATININE 0.92  --  1.06  CALCIUM 9.0  --  9.1  GFRNONAA >60  --  >60  GFRAA >60  --  >60  ANIONGAP 9  --  3*     HematologyNo results for input(s): WBC, RBC, HGB, HCT, MCV, MCH, MCHC, RDW, PLT in the last 168 hours.  Cardiac EnzymesNo results for input(s): TROPONINI in the last 168 hours. No results for input(s): TROPIPOC in the last 168 hours.   BNPNo results for input(s): BNP, PROBNP in the last 168 hours.   DDimer No results for input(s): DDIMER in the last 168 hours.   Radiology    No results found.  Cardiac Studies   08/09/17; CPX Exercise testing with gas exchange shows a mild functional impairment which is multifactorial in nature. However when corrected for his ideal body weight, his functional capacity is normal suggesting that the patient's obesity is likely the primary limiting factor in his exercise intolerance.  At peak exercise, patient reached his ventilatory limits - again suggesting that the patient's obesity and related restrictive ventilation is major limiting factor. That said, the elevated VE/VCO2 in the setting of hypertensive response to exercise is suggestive of mild diastolic dysfunction.   04/11/17; Cardiac MRI IMPRESSION: 1) Findings consistent with form of non obstructive resting HOCM Septal thickness 28 mm Given extent of thickening should have further risk stratification to possibly include sAECG and EP consult 2) Small LV cavity size with near mid cavity obliteration no obvious SAM EF 58% 3) Significant gadolinium uptake in the mid, anterior and inferior septum as well as mid anterior wall suggesting presence of myofibrillar disarray 4) Small pericardial effusion 5) Severe LAE   03/25/17: TTE Study Conclusions - Left ventricle: The cavity size was normal. There was severe focal basal and moderate concentric hypertrophy. Systolic function was normal. The estimated ejection fraction was in  the range of 60% to 65%. Wall motion was normal; there were no regional wall motion abnormalities. There was a reduced contribution of atrial contraction to ventricular filling, due to increased ventricular diastolic pressure or atrial contractile dysfunction. Doppler parameters are consistent with a reversible restrictive pattern, indicative of decreased left ventricular diastolic compliance and/or increased left atrial pressure (grade 3 diastolic dysfunction). - Aortic valve: Trileaflet; mildly thickened, mildly calcified leaflets. - Aorta: Aortic root dimension: 38 mm (ED). - Aortic root: The aortic root was mildly dilated. - Left atrium: The atrium was moderately dilated. - Tricuspid valve: There was trivial regurgitation.        - Pulmonic valve: There was trivial regurgitation. - Pulmonary arteries: Systolic pressure could not be accurately estimated. - Recommendations: Recommend Cardiac MRI for further evaluation of HOCM. Impressions: - There is marked hypertrophy of the IVS. Cannot visualize whether there is SAM but no evidence of LVOT gradient Recommendations: Recommend Cardiac MRI for further evaluation of HOCM.  Patient Profile     57 y.o. male with CAD (2016 CABG/MAZE, LAA clipping), HTN, chronic CHF (diastoli), OSA w/CPAP, obesity,  and AFib/flutter (h/o prior PVI ablation x2, and failed AAD w/Flecainide, Multaq, Dofetilide, Amiodarone), admitted for Tikosyn initiation  Assessment & Plan    1. Persistent AFib, admit for Tikosyn     CHA2DS2Vasc is 3, on eliquis     K+ 4.8     Mag 2.0     Creat 1.06     QTc, stable from baseline     Maintaining SR  2. Hypertrophic CM     (non-obstructive) 3. Chronic CHF (diastolic)     No exam findings to suggest fluid OL     Continue home meds  3. CAD     No anginal symptoms     Exercising/walking regularly     Continue home meds  4. HTN     Continue home meds  5. OSA w/CPAP     brought  his home CPAP  For questions or updates, please contact [facility] Please consult [email or web address] for contact info under        Signed, Baldwin Jamaica, PA-C  07/01/2018, 7:29 AM     I have seen, examined the patient, and reviewed the above assessment and plan.  Changes to above are made where necessary.  On exam, RRR.  Converted to sinus.  QT is stable. Will obtain echo to evaluate his HCM.  Co Sign: [name], MD 07/01/2018 10:44 AM

## 2018-07-01 NOTE — Progress Notes (Signed)
QTc after third dose of Tikosyn is 501 will continue to monitor

## 2018-07-02 ENCOUNTER — Encounter (HOSPITAL_COMMUNITY): Payer: Self-pay | Admitting: Certified Registered Nurse Anesthetist

## 2018-07-02 ENCOUNTER — Other Ambulatory Visit: Payer: Self-pay

## 2018-07-02 ENCOUNTER — Encounter (HOSPITAL_COMMUNITY): Payer: Self-pay

## 2018-07-02 ENCOUNTER — Inpatient Hospital Stay (HOSPITAL_COMMUNITY): Payer: BLUE CROSS/BLUE SHIELD

## 2018-07-02 ENCOUNTER — Encounter (HOSPITAL_COMMUNITY)
Admission: RE | Disposition: A | Discharge status: Home / Self Care | Payer: Self-pay | Source: Home / Self Care | Attending: Internal Medicine

## 2018-07-02 DIAGNOSIS — I5030 Unspecified diastolic (congestive) heart failure: Secondary | ICD-10-CM

## 2018-07-02 LAB — ECHOCARDIOGRAM COMPLETE
HEIGHTINCHES: 68 in
WEIGHTICAEL: 4526.4 [oz_av]

## 2018-07-02 LAB — BASIC METABOLIC PANEL
Anion gap: 6 (ref 5–15)
BUN: 10 mg/dL (ref 6–20)
CHLORIDE: 98 mmol/L (ref 98–111)
CO2: 29 mmol/L (ref 22–32)
CREATININE: 0.93 mg/dL (ref 0.61–1.24)
Calcium: 9 mg/dL (ref 8.9–10.3)
GFR calc Af Amer: >60 mL/min (ref >60)
GFR calc non Af Amer: >60 mL/min (ref >60)
Glucose, Bld: 131 mg/dL — ABNORMAL HIGH (ref 70–99)
POTASSIUM: 3.9 mmol/L (ref 3.5–5.1)
SODIUM: 133 mmol/L — AB (ref 135–145)

## 2018-07-02 LAB — MAGNESIUM: Magnesium: 1.8 mg/dL (ref 1.7–2.4)

## 2018-07-02 SURGERY — CANCELLED PROCEDURE

## 2018-07-02 MED ORDER — PERFLUTREN LIPID MICROSPHERE
1.0000 mL | INTRAVENOUS | Status: AC | PRN
  Administered 2018-07-02: 2 mL via INTRAVENOUS
  Filled 2018-07-02: qty 10

## 2018-07-02 MED ORDER — MAGNESIUM SULFATE 2 GM/50ML IV SOLN
2.0000 g | Freq: Once | INTRAVENOUS | Status: AC
  Administered 2018-07-02: 2 g via INTRAVENOUS
  Filled 2018-07-02: qty 50

## 2018-07-02 MED ORDER — POTASSIUM CHLORIDE CRYS ER 20 MEQ PO TBCR
30.0000 meq | EXTENDED_RELEASE_TABLET | Freq: Once | ORAL | Status: AC
  Administered 2018-07-02: 30 meq via ORAL
  Filled 2018-07-02: qty 1

## 2018-07-02 NOTE — Discharge Instructions (Addendum)
You have an appointment set up with the [facility].  Multiple studies have shown that being followed by a dedicated atrial fibrillation clinic in addition to the standard care you receive from your other physicians improves health. We believe that enrollment in the atrial fibrillation clinic will allow [place] to better care for you.   The phone number to the [facility] is [phone]. The clinic is staffed Monday through Friday from 8:30am to 5pm.  Parking Directions: The clinic is located in the Heart and Vascular Building connected to [facility]. 1)From [address] turn on to [address] and go to the 3rd entrance  (Heart and Vascular entrance) on the right. 2)Look to the right for Heart &Vascular Parking Garage. 3)A code for the entrance is required please call the clinic to receive this.   4)Take the elevators to the 1st floor. Registration is in the room with the glass walls at the end of the hallway.  If you have any trouble parking or locating the clinic, please dont hesitate to call [phone]. Information on my medicine - ELIQUIS (apixaban)  Why was Eliquis prescribed for you? Eliquis was prescribed for you to reduce the risk of a blood clot forming that can cause a stroke if you have a medical condition called atrial fibrillation (a type of irregular heartbeat).  What do You need to know about Eliquis ? Take your Eliquis TWICE DAILY - one tablet in the morning and one tablet in the evening with or without food. If you have difficulty swallowing the tablet whole please discuss with your pharmacist how to take the medication safely.  Take Eliquis exactly as prescribed by your doctor and DO NOT stop taking Eliquis without talking to the doctor who prescribed the medication.  Stopping may increase your risk of developing a stroke.  Refill your prescription before you run out.  After discharge, you should have regular check-up  appointments with your healthcare provider that is prescribing your Eliquis.  In the future your dose may need to be changed if your kidney function or weight changes by a significant amount or as you get older.  What do you do if you miss a dose? If you miss a dose, take it as soon as you remember on the same day and resume taking twice daily.  Do not take more than one dose of ELIQUIS at the same time to make up a missed dose.  Important Safety Information A possible side effect of Eliquis is bleeding. You should call your healthcare provider right away if you experience any of the following: ? Bleeding from an injury or your nose that does not stop. ? Unusual colored urine (red or dark brown) or unusual colored stools (red or black). ? Unusual bruising for unknown reasons. ? A serious fall or if you hit your head (even if there is no bleeding).  Some medicines may interact with Eliquis and might increase your risk of bleeding or clotting while on Eliquis. To help avoid this, consult your healthcare provider or pharmacist prior to using any new prescription or non-prescription medications, including herbals, vitamins, non-steroidal anti-inflammatory drugs (NSAIDs) and supplements.  This website has more information on Eliquis (apixaban): http://www.eliquis.com/eliquis/home

## 2018-07-02 NOTE — Progress Notes (Signed)
  Echocardiogram 2D Echocardiogram has been performed.  [name] F 07/02/2018, 9:41 AM

## 2018-07-02 NOTE — Progress Notes (Signed)
Potassium 3.9 this morning.  RN paged Cardiology via [facility] with this information.

## 2018-07-02 NOTE — Progress Notes (Addendum)
Progress Note  Patient Name: Harold Scott Date of Encounter: 07/02/2018  Primary Cardiologist: [name]  Subjective   Hungry, otherwise no complaints  Inpatient Medications    Scheduled Meds: . apixaban  5 mg Oral BID  . aspirin EC  81 mg Oral Daily  . carvedilol  12.5 mg Oral BID WC  . dofetilide  500 mcg Oral BID  . DULoxetine  60 mg Oral q morning - 10a  . furosemide  20 mg Oral q morning - 10a  . lisinopril  30 mg Oral BID  . pantoprazole  40 mg Oral Daily  . potassium chloride SA  20 mEq Oral Daily  . Semaglutide (1 MG/DOSE)  1 Dose Subcutaneous Q Wed  . simvastatin  20 mg Oral q1800  . sodium chloride flush  3 mL Intravenous Q12H  . Vitamin D (Ergocalciferol)  50,000 Units Oral Q Wed   Continuous Infusions: . sodium chloride    . magnesium sulfate 1 - 4 g bolus IVPB     PRN Meds: sodium chloride, clorazepate, sodium chloride flush   Vital Signs    Vitals:   07/01/18 1732 07/01/18 1955 07/02/18 0500 07/02/18 0553  BP: (!) 153/99 124/70  (!) 152/96  Pulse: 71 72  66  Resp:    18  Temp:  98.1 F (36.7 C)  98.1 F (36.7 C)  TempSrc:  Oral  Oral  SpO2:  96%  98%  Weight:   128.3 kg   Height:        Intake/Output Summary (Last 24 hours) at 07/02/2018 0834 Last data filed at 07/02/2018 0700 Gross per 24 hour  Intake 480 ml  Output 2025 ml  Net -1545 ml   Filed Weights   06/30/18 1152 07/01/18 0438 07/02/18 0500  Weight: 129.3 kg 129.2 kg 128.3 kg    Telemetry    SR, rare PVC - Personally Reviewed  ECG    SR, 66bpm, QTc measured by myself 454ms, QTc 47ms - Personally Reviewed  Physical Exam   Exam today is unchanged GEN: No acute distress.   Neck: No JVD Cardiac: RRR, no murmurs, rubs, or gallops.  Respiratory: CTA b/l. GI: Soft, nontender, non-distended  MS: No edema; No deformity. Neuro:  Nonfocal  Psych: Normal affect   Labs    Chemistry Recent Labs  Lab 06/30/18 1115 06/30/18 1431 07/01/18 0415 07/02/18 0430  NA  135  --  136 133*  K 3.9 4.2 4.8 3.9  CL 99  --  102 98  CO2 27  --  31 29  GLUCOSE 133*  --  123* 131*  BUN 11  --  11 10  CREATININE 0.92  --  1.06 0.93  CALCIUM 9.0  --  9.1 9.0  GFRNONAA >60  --  >60 >60  GFRAA >60  --  >60 >60  ANIONGAP 9  --  3* 6     HematologyNo results for input(s): WBC, RBC, HGB, HCT, MCV, MCH, MCHC, RDW, PLT in the last 168 hours.  Cardiac EnzymesNo results for input(s): TROPONINI in the last 168 hours. No results for input(s): TROPIPOC in the last 168 hours.   BNPNo results for input(s): BNP, PROBNP in the last 168 hours.   DDimer No results for input(s): DDIMER in the last 168 hours.   Radiology    No results found.  Cardiac Studies   08/09/17; CPX Exercise testing with gas exchange shows a mild functional impairment which is multifactorial in nature. However when corrected  for his ideal body weight, his functional capacity is normal suggesting that the patient's obesity is likely the primary limiting factor in his exercise intolerance. At peak exercise, patient reached his ventilatory limits - again suggesting that the patient's obesity and related restrictive ventilation is major limiting factor. That said, the elevated VE/VCO2 in the setting of hypertensive response to exercise is suggestive of mild diastolic dysfunction.   04/11/17; Cardiac MRI IMPRESSION: 1) Findings consistent with form of non obstructive resting HOCM Septal thickness 28 mm Given extent of thickening should have further risk stratification to possibly include sAECG and EP consult 2) Small LV cavity size with near mid cavity obliteration no obvious SAM EF 58% 3) Significant gadolinium uptake in the mid, anterior and inferior septum as well as mid anterior wall suggesting presence of myofibrillar disarray 4) Small pericardial effusion 5) Severe LAE   03/25/17: TTE Study Conclusions - Left ventricle: The cavity size was normal. There was severe focal basal and  moderate concentric hypertrophy. Systolic function was normal. The estimated ejection fraction was in the range of 60% to 65%. Wall motion was normal; there were no regional wall motion abnormalities. There was a reduced contribution of atrial contraction to ventricular filling, due to increased ventricular diastolic pressure or atrial contractile dysfunction. Doppler parameters are consistent with a reversible restrictive pattern, indicative of decreased left ventricular diastolic compliance and/or increased left atrial pressure (grade 3 diastolic dysfunction). - Aortic valve: Trileaflet; mildly thickened, mildly calcified leaflets. - Aorta: Aortic root dimension: 38 mm (ED). - Aortic root: The aortic root was mildly dilated. - Left atrium: The atrium was moderately dilated. - Tricuspid valve: There was trivial regurgitation.        - Pulmonic valve: There was trivial regurgitation. - Pulmonary arteries: Systolic pressure could not be accurately estimated. - Recommendations: Recommend Cardiac MRI for further evaluation of HOCM. Impressions: - There is marked hypertrophy of the IVS. Cannot visualize whether there is SAM but no evidence of LVOT gradient Recommendations: Recommend Cardiac MRI for further evaluation of HOCM.  Patient Profile     57 y.o. male with CAD (2016 CABG/MAZE, LAA clipping), HTN, chronic CHF (diastoli), OSA w/CPAP, obesity,  and AFib/flutter (h/o prior PVI ablation x2, and failed AAD w/Flecainide, Multaq, Dofetilide, Amiodarone), admitted for Tikosyn initiation  Assessment & Plan    1. Persistent AFib, admit for Tikosyn     CHA2DS2Vasc is 3, on eliquis     K+ 3.9, replacement ordered     Mag 1.8, replacement ordered     Creat 0.93, stable     QTc, EKG reviewed with [name], borderline, continue 521mcg this AM     Maintaining SR  2. Hypertrophic CM     (non-obstructive)     Echo to be updated this admission, is  pending 3. Chronic CHF (diastolic)     No exam findings to suggest fluid OL     Continue home meds  3. CAD     No anginal symptoms     Exercising/walking regularly     Continue home meds  4. HTN     Continue home meds  5. OSA w/CPAP     brought his home CPAP  For questions or updates, please contact [place] Please consult [email or web address] for contact info under        Signed, Baldwin Jamaica, PA-C  07/02/2018, 8:34 AM     I have seen, examined the patient, and reviewed the above assessment and plan.  Changes to above are  made where necessary.  On exam, RRR.  Doing well.  QT is stable.  No changes today.  Co Sign: [name], MD

## 2018-07-03 ENCOUNTER — Telehealth (HOSPITAL_COMMUNITY): Payer: Self-pay | Admitting: *Deleted

## 2018-07-03 LAB — MAGNESIUM: MAGNESIUM: 2.1 mg/dL (ref 1.7–2.4)

## 2018-07-03 LAB — BASIC METABOLIC PANEL
ANION GAP: 10 (ref 5–15)
BUN: 9 mg/dL (ref 6–20)
CALCIUM: 9.2 mg/dL (ref 8.9–10.3)
CO2: 28 mmol/L (ref 22–32)
Chloride: 97 mmol/L — ABNORMAL LOW (ref 98–111)
Creatinine, Ser: 1.06 mg/dL (ref 0.61–1.24)
GFR calc Af Amer: >60 mL/min (ref >60)
GLUCOSE: 125 mg/dL — AB (ref 70–99)
Potassium: 4.5 mmol/L (ref 3.5–5.1)
Sodium: 135 mmol/L (ref 135–145)

## 2018-07-03 MED ORDER — DOFETILIDE 500 MCG PO CAPS: 500.0000 ug | ORAL_CAPSULE | Freq: Two times a day (BID) | ORAL | 6 refills | Status: DC

## 2018-07-03 MED ORDER — DOFETILIDE 500 MCG PO CAPS: 500.0000 ug | ORAL_CAPSULE | Freq: Two times a day (BID) | ORAL | 0 refills | Status: DC

## 2018-07-03 MED FILL — TIKOSYN 500 MCG CAPS: 500 | 7 days supply | Qty: 14 | Fill #0

## 2018-07-03 NOTE — Progress Notes (Signed)
Patient received discharge information and acknowledged understanding of it. Patient will receive tikosyn from pharmacy prior to discharge. Patient IV was removed.

## 2018-07-03 NOTE — Discharge Summary (Addendum)
ELECTROPHYSIOLOGY PROCEDURE DISCHARGE SUMMARY    Patient ID: Harold Scott,  MRN: [id number], DOB/AGE: Feb 08, 1961 57 y.o.  Admit date: 06/30/2018 Discharge date: 07/03/2018  Primary Care Physician: System, Pcp Not In  Primary Cardiologist/Electrophysiologist: [name]  Primary Discharge Diagnosis:  1.  Persistent atrial fibrillation status post Tikosyn loading this admission      CHA2DS2Vasc is Eliquis, appropriately dosed  Secondary Discharge Diagnosis:  1. CAD 2. HTN 3. Chronic CHF (distolic) 4. Obesity 5. HCM  Allergies  Allergen Reactions  . Flecainide Shortness Of Breath and Other (See Comments)    EXTREME SOB  . Multaq [Dronedarone] Shortness Of Breath  . Toprol Xl [Metoprolol Tartrate] Shortness Of Breath  . Penicillins Hives    Has patient had a PCN reaction causing immediate rash, facial/tongue/throat swelling, SOB or lightheadedness with hypotension:unsure Has patient had a PCN reaction causing severe rash involving mucus membranes or skin necrosis:unsure Has patient had a PCN reaction that required hospitalization:No Has patient had a PCN reaction occurring within the last 10 years:No If all of the above answers are "NO", then may proceed with Cephalosporin use.      Procedures This Admission:  1.  Tikosyn loading   Brief HPI: Harold Scott is a 57 y.o. male with a past medical history as noted above.  He is followed by EP in the outpatient setting for his atrial fibrillation.  Risks, benefits, and alternatives to Tikosyn were reviewed with the patient who wished to proceed.    Hospital Course:  The patient was admitted and Tikosyn was initiated.  Renal function and electrolytes were followed during the hospitalization.  His QTc remained stable.  His echo was updated noting normal systolic function, severe focal basal and mid septal LVH with no significant LVOT obstruction, no SAM.  The patient arrived in SR and maintained SR through is stay.  On  the day of discharge, he feels well, no CP or SOB.  The patient was examined by Dr [name] who considered him stable for discharge to home.  The patient lives about an hour away, given this he prefers for his local PMD do his labs and EKG at 1 week.  This was OK with [name], the patient is certain he will be able to get an appointment and have them done next week.  He is given an script requesting BMET, magnesium level and EKG be done on 07/10/18 and faxed to the Afib clinic (info provided), he will see Dr [name] in 4 weeks.  Tikosyn education was completed.  Physical Exam: Vitals:   07/02/18 1611 07/02/18 1956 07/03/18 0539 07/03/18 0805  BP: 131/86 (!) 153/87 135/87 (!) 142/85  Pulse: 65 68 67 67  Resp:  16    Temp: 98.7 F (37.1 C) 98.6 F (37 C) 98.1 F (36.7 C)   TempSrc: Oral Oral Oral   SpO2: 98% 98% 98%   Weight:   128.1 kg   Height:        GEN- The patient is well appearing, alert and oriented x 3 today.   HEENT: normocephalic, atraumatic; sclera clear, conjunctiva pink; hearing intact; oropharynx clear; neck supple, no JVP Lymph- no cervical lymphadenopathy Lungs- CTA b/l, normal work of breathing.  No wheezes, rales, rhonchi Heart- RRR, no murmurs, rubs or gallops, PMI not laterally displaced GI- soft, non-tender, non-distended Extremities- no clubbing, cyanosis, or edema MS- no significant deformity or atrophy Skin- warm and dry, no rash or lesion Psych- euthymic mood, full affect Neuro-  strength and sensation are intact   Labs:   Lab Results  Component Value Date   WBC 7.9 07/31/2017   HGB 12.7 (L) 07/31/2017   HCT 37.7 07/31/2017   MCV 90 07/31/2017   PLT 227 07/31/2017    Recent Labs  Lab 07/03/18 0417  NA 135  K 4.5  CL 97*  CO2 28  BUN 9  CREATININE 1.06  CALCIUM 9.2  GLUCOSE 125*     Discharge Medications:  Allergies as of 07/03/2018      Reactions   Flecainide Shortness Of Breath, Other (See Comments)   EXTREME SOB   Multaq  [dronedarone] Shortness Of Breath   Toprol Xl [metoprolol Tartrate] Shortness Of Breath   Penicillins Hives   Has patient had a PCN reaction causing immediate rash, facial/tongue/throat swelling, SOB or lightheadedness with hypotension:unsure Has patient had a PCN reaction causing severe rash involving mucus membranes or skin necrosis:unsure Has patient had a PCN reaction that required hospitalization:No Has patient had a PCN reaction occurring within the last 10 years:No If all of the above answers are "NO", then may proceed with Cephalosporin use.      Medication List    TAKE these medications   aspirin 81 MG EC tablet Take 1 tablet (81 mg total) by mouth daily.   carvedilol 12.5 MG tablet Commonly known as:  COREG Take 12.5 mg by mouth 2 (two) times daily with a meal.   cholestyramine 4 g packet Commonly known as:  QUESTRAN Take 2 g by mouth as needed.   clorazepate 7.5 MG tablet Commonly known as:  TRANXENE Take 1 tablet (7.5 mg total) by mouth at bedtime. What changed:    when to take this  reasons to take this   dofetilide 500 MCG capsule Commonly known as:  TIKOSYN Take 1 capsule (500 mcg total) by mouth 2 (two) times daily.   DULoxetine 60 MG capsule Commonly known as:  CYMBALTA Take 60 mg by mouth every morning.   ELIQUIS 5 MG Tabs tablet Generic drug:  apixaban TAKE 1 TABLET BY MOUTH TWICE A DAY What changed:  how much to take   esomeprazole 20 MG capsule Commonly known as:  NEXIUM Take 20 mg by mouth at bedtime.   furosemide 40 MG tablet Commonly known as:  LASIX Take 20 mg by mouth every morning.   guaiFENesin 200 MG tablet Take 200 mg by mouth 2 (two) times daily as needed (for cold).   KLOR-CON M20 20 MEQ tablet Generic drug:  potassium chloride SA TAKE 1 TABLET BY MOUTH EVERY DAY What changed:    how much to take  when to take this   lisinopril 30 MG tablet Commonly known as:  PRINIVIL,ZESTRIL Take 30 mg by mouth 2 (two) times  daily.   OZEMPIC (1 MG/DOSE) 2 MG/1.5ML Sopn Generic drug:  Semaglutide (1 MG/DOSE) Inject 1 Dose into the skin every Wednesday.   simvastatin 20 MG tablet Commonly known as:  ZOCOR Take 1 tablet (20 mg total) by mouth daily at 6 PM.   Vitamin D (Ergocalciferol) 1.25 MG (50000 UT) Caps capsule Commonly known as:  DRISDOL Take 50,000 Units by mouth every Wednesday.       Disposition:  Home  Discharge Instructions    Diet - low sodium heart healthy   Complete by:  As directed    Diet - low sodium heart healthy   Complete by:  As directed    Increase activity slowly   Complete by:  As  directed    Increase activity slowly   Complete by:  As directed      Follow-up Information    [name], MD Follow up.   Specialty:  Cardiology Why:  08/04/18 @ 9:15AM Contact information: [address] [place] [address] [phone]        [facility] Follow up.   Specialty:  Cardiology Why:  remember to see your primary care doctor 07/10/18 for labs/EKG as we discussed and fax to the Afib clinic Contact information: [address] [phone] [place] [address] [phone]          Duration of Discharge Encounter: Greater than 30 minutes including physician time.  Signed, Tommye Standard, PA-C 07/03/2018 12:42 PM  I have seen, examined the patient, and reviewed the above assessment and plan.  Changes to above are made where necessary.  On exam, RRR.  Doing well with tikosyn.  Qt is stable.  DC to home with close outpatient follow-up.  Co Sign: [name], MD 07/03/2018 4:28 PM

## 2018-07-03 NOTE — Telephone Encounter (Signed)
Per [name], pt will have Harold Scott [place]ed to our office.  Joseph Art has provided pt with fax number and written Rx for the labs and EKG.  Reminder sent to inbox for follow up

## 2018-07-07 ENCOUNTER — Ambulatory Visit (HOSPITAL_COMMUNITY): Payer: BLUE CROSS/BLUE SHIELD | Admitting: Nurse Practitioner

## 2018-07-10 ENCOUNTER — Ambulatory Visit (HOSPITAL_COMMUNITY): Payer: BLUE CROSS/BLUE SHIELD | Admitting: Nurse Practitioner

## 2018-07-10 ENCOUNTER — Encounter: Payer: Self-pay | Admitting: Internal Medicine

## 2018-07-14 NOTE — Telephone Encounter (Signed)
Patient labs from PCP office for 1 week tikosyn follow up  -- potassium 4.2 Magnesium 1.8 - per donna carroll start on magnesium supplement once a day - creatinine 0.90. EKG normal sinus rhythm HR 73 QT stable at 48ms.

## 2018-07-21 ENCOUNTER — Ambulatory Visit: Payer: BLUE CROSS/BLUE SHIELD | Admitting: Internal Medicine

## 2018-07-24 ENCOUNTER — Encounter: Payer: Self-pay | Admitting: Internal Medicine

## 2018-08-04 ENCOUNTER — Encounter: Payer: Self-pay | Admitting: Internal Medicine

## 2018-08-04 ENCOUNTER — Ambulatory Visit (INDEPENDENT_AMBULATORY_CARE_PROVIDER_SITE_OTHER): Payer: BLUE CROSS/BLUE SHIELD | Admitting: Internal Medicine

## 2018-08-04 VITALS — BP 136/84 | HR 77 | Ht 68.0 in | Wt 285.0 lb

## 2018-08-04 DIAGNOSIS — R0602 Shortness of breath: Secondary | ICD-10-CM

## 2018-08-04 DIAGNOSIS — G4733 Obstructive sleep apnea (adult) (pediatric): Secondary | ICD-10-CM

## 2018-08-04 DIAGNOSIS — I4819 Other persistent atrial fibrillation: Secondary | ICD-10-CM

## 2018-08-04 DIAGNOSIS — I2510 Atherosclerotic heart disease of native coronary artery without angina pectoris: Secondary | ICD-10-CM

## 2018-08-04 DIAGNOSIS — I422 Other hypertrophic cardiomyopathy: Secondary | ICD-10-CM | POA: Diagnosis not present

## 2018-08-04 NOTE — Progress Notes (Signed)
PCP: System, Pcp Not In   Primary EP: Dr [name]  Harold Scott is a 57 y.o. male who presents today for routine electrophysiology followup.  Since last being seen in our clinic, the patient reports doing very well.  Today, he denies symptoms of palpitations, chest pain, shortness of breath,  lower extremity edema, dizziness, presyncope, or syncope.  The patient is otherwise without complaint today.   Past Medical History:  Diagnosis Date  . Anxiety   . Arthritis   . Coronary artery disease    a. s/p CABG 06/2015 LIMA to D1 and distal LAD, sequentially  . Depression   . Diastolic CHF, chronic ([place])   . Essential hypertension   . Gallstones   . GERD (gastroesophageal reflux disease)   . History of echocardiogram    Echo done in [place] (Dr. Clare Gandy) 3/18: EF 40-45, inf HK, diast dysfn, mild TR, RVSP 35  . History of hiatal hernia   . History of nuclear stress test    Myoview in [place], [place] 3/18: No ischemia, EF 64  . Hyperlipidemia   . Hypertrophic cardiomyopathy ([place])    cMRI 8/18: c/w non-obs resting HOCM, septal thickness 28 mm, no obvious SAM, near mid cavity obliteration, EF 58, significant gadolinium uptake in mid, ant and inf septum and ant wall suggesting presence of myofibrillar disarray, small pericardial eff, severe LAE  . Mitral regurgitation   . Morbid obesity ([place])   . Obstructive sleep apnea    a. on CPAP  . Paroxysmal atrial fibrillation ([facility])    a.  previously intolerant of Flecainide and Multaq b. PVI by Dr [name] 09/21/14 c. s/p MAZE Dr Roxy Manns 06/2015  . S/P Maze operation for atrial fibrillation 06/30/2015   Complete bilateral atrial lesion set using bipolar radiofrequency and cryothermy ablation with clipping of LA appendage via conventional median sternotomy    Past Surgical History:  Procedure Laterality Date  . ABLATION  09/21/14   PVI by Dr [name]  . ATRIAL FIBRILLATION ABLATION N/A 09/21/2014   Procedure: ATRIAL FIBRILLATION ABLATION;   Surgeon: [name], MD;  Location[facility] CATH LAB;  Service: Cardiovascular;  Laterality: N/A;  . CARDIAC CATHETERIZATION N/A 06/20/2015   Procedure: Right/Left Heart Cath and Coronary Angiography;  Surgeon: Burnell Blanks, MD;  Location: [place] CV LAB;  Service: Cardiovascular;  Laterality: N/A;  . CHOLECYSTECTOMY  08/05/2012   Procedure: LAPAROSCOPIC CHOLECYSTECTOMY WITH INTRAOPERATIVE CHOLANGIOGRAM;  Surgeon: [name], MD,FACS;  Location: [place];  Service: General;  Laterality: N/A;  . CORONARY ARTERY BYPASS GRAFT N/A 06/30/2015   Procedure: CORONARY ARTERY BYPASS GRAFTING (CABG)X2 SEQ LIMA-DIAG-LAD;  Surgeon: Rexene Alberts, MD;  Location: [facility] [place];  Service: Open Heart Surgery;  Laterality: N/A;  . ELECTROPHYSIOLOGIC STUDY N/A 01/11/2015   Procedure: Atrial Fibrillation Ablation;  Surgeon: [name], MD;  Location: [place] CV LAB;  Service: Cardiovascular;  Laterality: N/A;  . HAND SURGERY    . LAPAROSCOPIC APPENDECTOMY N/A 02/13/2014   Procedure: APPENDECTOMY LAPAROSCOPIC;  Surgeon: [name], MD;  Location: [facility];  Service: General;  Laterality: N/A;  . MAZE N/A 06/30/2015   Procedure: MAZE;  Surgeon: Rexene Alberts, MD;  Location: [facility];  Service: Open Heart Surgery;  Laterality: N/A;  . TEE WITHOUT CARDIOVERSION N/A 09/20/2014   Procedure: TRANSESOPHAGEAL ECHOCARDIOGRAM (TEE);  Surgeon: [name], MD;  Location: [place] [facility];  Service: Cardiovascular;  Laterality: N/A;  . TEE WITHOUT CARDIOVERSION N/A 01/10/2015  Procedure: TRANSESOPHAGEAL ECHOCARDIOGRAM (TEE);  Surgeon: Sanda Klein, MD;  Location: [facility] ENDOSCOPY;  Service: Cardiovascular;  Laterality: N/A;  . TEE WITHOUT CARDIOVERSION N/A 06/30/2015   Procedure: TRANSESOPHAGEAL ECHOCARDIOGRAM (TEE);  Surgeon: Rexene Alberts, MD;  Location: [facility];  Service: Open Heart Surgery;  Laterality: N/A;  . TONSILLECTOMY    . WISDOM TOOTH EXTRACTION      ROS- all systems are reviewed and negatives except as  per HPI above  Current Outpatient Medications  Medication Sig Dispense Refill  . aspirin EC 81 MG EC tablet Take 1 tablet (81 mg total) by mouth daily.    . carvedilol (COREG) 12.5 MG tablet Take 12.5 mg by mouth 2 (two) times daily with a meal.   3  . cholestyramine (QUESTRAN) 4 g packet Take 2 g by mouth as needed.     . clorazepate (TRANXENE) 7.5 MG tablet Take 1 tablet (7.5 mg total) by mouth at bedtime. (Patient taking differently: Take 7.5 mg by mouth at bedtime as needed for anxiety or sleep. ) 30 tablet 1  . dofetilide (TIKOSYN) 500 MCG capsule Take 1 capsule (500 mcg total) by mouth 2 (two) times daily. 60 capsule 6  . DULoxetine (CYMBALTA) 60 MG capsule Take 60 mg by mouth every morning.     Marland Kitchen ELIQUIS 5 MG TABS tablet TAKE 1 TABLET BY MOUTH TWICE A DAY (Patient taking differently: Take 5 mg by mouth 2 (two) times daily. ) 60 tablet 10  . esomeprazole (NEXIUM) 20 MG capsule Take 20 mg by mouth at bedtime.     . furosemide (LASIX) 40 MG tablet Take 20 mg by mouth every morning.     Marland Kitchen guaiFENesin 200 MG tablet Take 200 mg by mouth 2 (two) times daily as needed (for cold).    Marland Kitchen KLOR-CON M20 20 MEQ tablet TAKE 1 TABLET BY MOUTH EVERY DAY (Patient taking differently: Take 20 mEq by mouth every morning. ) 30 tablet 6  . lisinopril (PRINIVIL,ZESTRIL) 30 MG tablet Take 30 mg by mouth 2 (two) times daily.     Marland Kitchen OZEMPIC, 1 MG/DOSE, 2 MG/1.5ML SOPN Inject 1 Dose into the skin every Wednesday.     . simvastatin (ZOCOR) 20 MG tablet Take 1 tablet (20 mg total) by mouth daily at 6 PM. 90 tablet 0  . Vitamin D, Ergocalciferol, (DRISDOL) 1.25 MG (50000 UT) CAPS capsule Take 50,000 Units by mouth every Wednesday.      No current facility-administered medications for this visit.     Physical Exam: Vitals:   08/04/18 0939  BP: 136/84  Pulse: 77  SpO2: 99%  Weight: 285 lb (129.3 kg)  Height: 5\' 8"  (1.727 m)    GEN- The patient is well appearing, alert and oriented x 3 today.   Head-  normocephalic, atraumatic Eyes-  Sclera clear, conjunctiva pink Ears- hearing intact Oropharynx- clear Lungs- Clear to ausculation bilaterally, normal work of breathing Heart- Regular rate and rhythm, no murmurs, rubs or gallops, PMI not laterally displaced GI- soft, NT, ND, + BS Extremities- no clubbing, cyanosis, or edema  Wt Readings from Last 3 Encounters:  08/04/18 285 lb (129.3 kg)  07/03/18 282 lb 6.4 oz (128.1 kg)  06/30/18 286 lb (129.7 kg)    EKG tracing ordered today is personally reviewed and shows sinus rhythm 77 bpm, PR 156 msec, QRS 114 msec, QTc 504 msec,   Assessment and Plan:  1. Persistent afib/ atypical atrial flutter S/p prior MAZE Doing much better with tikosyn Continue eliquis  2. Obesity Body mass index is 43.33 kg/m. Lifestyle modification again encouraged  3. OSA Compliant with CPAP  4. HTN Stable No change required today  5. CAD No ischemic symptoms  6. Hypertrophic nonobstructive CM Stable No change required today Echo 07/02/18 is reviewed.  He has severe focal basal and mid septal hypertrophy, no SAM, no obstruction, moderate LA enlargement  7. SOB Chronic and multifactoral Evaluated with CPX 08/09/17 which advised obesity as a major contributor.  Follow-up in AF clinic in 3 months  [name] MD, [facility] 08/04/2018 10:13 AM

## 2018-08-04 NOTE — Patient Instructions (Addendum)
Medication Instructions:  Your physician has recommended you make the following change in your medication:   1.  STOP taking ASPIRIN    Labwork: None ordered.  Testing/Procedures: None ordered.  Follow-Up: Your physician wants you to follow-up in: 3 months with Roderic Palau at the [facility].    Any Other Special Instructions Will Be Listed Below (If Applicable).  If you need a refill on your cardiac medications before your next appointment, please call your pharmacy.

## 2018-08-05 ENCOUNTER — Other Ambulatory Visit: Payer: Self-pay | Admitting: Internal Medicine

## 2018-08-05 NOTE — Telephone Encounter (Signed)
Eliquis 5mg  refill request received; pt is 57 yrs old, wt-129.3kg, Crea-1.06 on 07/03/18, last seen by [name] on 08/04/18; will send in refill to request ed pharmacy.

## 2018-09-18 ENCOUNTER — Other Ambulatory Visit (HOSPITAL_COMMUNITY): Payer: Self-pay | Admitting: *Deleted

## 2018-10-29 ENCOUNTER — Ambulatory Visit (HOSPITAL_COMMUNITY)
Admission: RE | Admit: 2018-10-29 | Discharge: 2018-10-29 | Disposition: A | Discharge status: Home / Self Care | Payer: BLUE CROSS/BLUE SHIELD | Source: Ambulatory Visit | Attending: Nurse Practitioner | Admitting: Nurse Practitioner

## 2018-10-29 ENCOUNTER — Other Ambulatory Visit: Payer: Self-pay

## 2018-10-29 ENCOUNTER — Encounter (HOSPITAL_COMMUNITY): Payer: Self-pay | Admitting: Physician Assistant

## 2018-10-29 VITALS — BP 158/92 | HR 69 | Ht 68.0 in | Wt 290.8 lb

## 2018-10-29 DIAGNOSIS — I4819 Other persistent atrial fibrillation: Secondary | ICD-10-CM | POA: Diagnosis not present

## 2018-10-29 DIAGNOSIS — Z87891 Personal history of nicotine dependence: Secondary | ICD-10-CM | POA: Diagnosis not present

## 2018-10-29 DIAGNOSIS — I422 Other hypertrophic cardiomyopathy: Secondary | ICD-10-CM | POA: Insufficient documentation

## 2018-10-29 DIAGNOSIS — Z7901 Long term (current) use of anticoagulants: Secondary | ICD-10-CM | POA: Diagnosis not present

## 2018-10-29 DIAGNOSIS — F329 Major depressive disorder, single episode, unspecified: Secondary | ICD-10-CM | POA: Insufficient documentation

## 2018-10-29 DIAGNOSIS — Z9049 Acquired absence of other specified parts of digestive tract: Secondary | ICD-10-CM | POA: Insufficient documentation

## 2018-10-29 DIAGNOSIS — I25810 Atherosclerosis of coronary artery bypass graft(s) without angina pectoris: Secondary | ICD-10-CM | POA: Insufficient documentation

## 2018-10-29 DIAGNOSIS — I5032 Chronic diastolic (congestive) heart failure: Secondary | ICD-10-CM | POA: Diagnosis not present

## 2018-10-29 DIAGNOSIS — I110 Hypertensive heart disease with heart failure: Secondary | ICD-10-CM | POA: Diagnosis not present

## 2018-10-29 DIAGNOSIS — G4733 Obstructive sleep apnea (adult) (pediatric): Secondary | ICD-10-CM | POA: Diagnosis not present

## 2018-10-29 DIAGNOSIS — Z880 Allergy status to penicillin: Secondary | ICD-10-CM | POA: Diagnosis not present

## 2018-10-29 DIAGNOSIS — E785 Hyperlipidemia, unspecified: Secondary | ICD-10-CM | POA: Insufficient documentation

## 2018-10-29 DIAGNOSIS — Z8249 Family history of ischemic heart disease and other diseases of the circulatory system: Secondary | ICD-10-CM | POA: Insufficient documentation

## 2018-10-29 DIAGNOSIS — I340 Nonrheumatic mitral (valve) insufficiency: Secondary | ICD-10-CM | POA: Insufficient documentation

## 2018-10-29 DIAGNOSIS — I10 Essential (primary) hypertension: Secondary | ICD-10-CM | POA: Diagnosis not present

## 2018-10-29 DIAGNOSIS — Z951 Presence of aortocoronary bypass graft: Secondary | ICD-10-CM | POA: Insufficient documentation

## 2018-10-29 DIAGNOSIS — Z883 Allergy status to other anti-infective agents status: Secondary | ICD-10-CM | POA: Insufficient documentation

## 2018-10-29 DIAGNOSIS — F419 Anxiety disorder, unspecified: Secondary | ICD-10-CM | POA: Insufficient documentation

## 2018-10-29 DIAGNOSIS — R9431 Abnormal electrocardiogram [ECG] [EKG]: Secondary | ICD-10-CM | POA: Diagnosis not present

## 2018-10-29 DIAGNOSIS — Z888 Allergy status to other drugs, medicaments and biological substances status: Secondary | ICD-10-CM | POA: Insufficient documentation

## 2018-10-29 DIAGNOSIS — Z6841 Body Mass Index (BMI) 40.0 and over, adult: Secondary | ICD-10-CM | POA: Diagnosis not present

## 2018-10-29 DIAGNOSIS — Z79899 Other long term (current) drug therapy: Secondary | ICD-10-CM | POA: Diagnosis not present

## 2018-10-29 LAB — BASIC METABOLIC PANEL
ANION GAP: 7 (ref 5–15)
BUN: 7 mg/dL (ref 6–20)
CO2: 26 mmol/L (ref 22–32)
Calcium: 9 mg/dL (ref 8.9–10.3)
Chloride: 103 mmol/L (ref 98–111)
Creatinine, Ser: 0.92 mg/dL (ref 0.61–1.24)
GFR calc Af Amer: >60 mL/min (ref >60)
GFR calc non Af Amer: >60 mL/min (ref >60)
GLUCOSE: 109 mg/dL — AB (ref 70–99)
POTASSIUM: 3.9 mmol/L (ref 3.5–5.1)
Sodium: 136 mmol/L (ref 135–145)

## 2018-10-29 LAB — MAGNESIUM: Magnesium: 1.9 mg/dL (ref 1.7–2.4)

## 2018-10-29 NOTE — Progress Notes (Signed)
Primary Care Physician: System, Pcp Not In Referring Physician: Dr. Minette Headland is a 58 y.o. male with a h/o HOCM, CAD afib, sp ablation x 2 and Maze  And  by pass by Dr. Roxy Manns in 06/2015. He recently saw [name]  and was c/o of intermittent arrhythmia's. It was decided to admit for Tikosyn. He had Tikosyn in the past, did not work too well then, but the hope is now since the Maze procedure.  On follow up today, patient reports that he has only had two episodes of palpitations since starting the dofetilide which is a vast improvement for him. Both episodes terminated within a few hours. He is very happy with his current therapy.  Today, he denies symptoms of palpitations, chest pain, shortness of breath, orthopnea, PND, lower extremity edema, dizziness, presyncope, syncope, or neurologic sequela. The patient is tolerating medications without difficulties and is otherwise without complaint today.   Past Medical History:  Diagnosis Date  . Anxiety   . Arthritis   . Coronary artery disease    a. s/p CABG 06/2015 LIMA to D1 and distal LAD, sequentially  . Depression   . Diastolic CHF, chronic ([place])   . Essential hypertension   . Gallstones   . GERD (gastroesophageal reflux disease)   . History of echocardiogram    Echo done in [place] (Dr. Clare Gandy) 3/18: EF 40-45, inf HK, diast dysfn, mild TR, RVSP 35  . History of hiatal hernia   . History of nuclear stress test    Myoview in [place], [place] 3/18: No ischemia, EF 64  . Hyperlipidemia   . Hypertrophic cardiomyopathy ([place])    cMRI 8/18: c/w non-obs resting HOCM, septal thickness 28 mm, no obvious SAM, near mid cavity obliteration, EF 58, significant gadolinium uptake in mid, ant and inf septum and ant wall suggesting presence of myofibrillar disarray, small pericardial eff, severe LAE  . Mitral regurgitation   . Morbid obesity ([place])   . Obstructive sleep apnea    a. on CPAP  . Paroxysmal atrial fibrillation  (HCC)    a.  previously intolerant of Flecainide and Multaq b. PVI by Dr [name] 09/21/14 c. s/p MAZE Dr Roxy Manns 06/2015  . S/P Maze operation for atrial fibrillation 06/30/2015   Complete bilateral atrial lesion set using bipolar radiofrequency and cryothermy ablation with clipping of LA appendage via conventional median sternotomy    Past Surgical History:  Procedure Laterality Date  . ABLATION  09/21/14   PVI by Dr [name]  . ATRIAL FIBRILLATION ABLATION N/A 09/21/2014   Procedure: ATRIAL FIBRILLATION ABLATION;  Surgeon: [name], MD;  Location: [facility] CATH LAB;  Service: Cardiovascular;  Laterality: N/A;  . CARDIAC CATHETERIZATION N/A 06/20/2015   Procedure: Right/Left Heart Cath and Coronary Angiography;  Surgeon: Burnell Blanks, MD;  Location: [place] CV LAB;  Service: Cardiovascular;  Laterality: N/A;  . CHOLECYSTECTOMY  08/05/2012   Procedure: LAPAROSCOPIC CHOLECYSTECTOMY WITH INTRAOPERATIVE CHOLANGIOGRAM;  Surgeon: [name], MD,FACS;  Location: [place];  Service: General;  Laterality: N/A;  . CORONARY ARTERY BYPASS GRAFT N/A 06/30/2015   Procedure: CORONARY ARTERY BYPASS GRAFTING (CABG)X2 SEQ LIMA-DIAG-LAD;  Surgeon: Rexene Alberts, MD;  Location: [place];  Service: Open Heart Surgery;  Laterality: N/A;  . ELECTROPHYSIOLOGIC STUDY N/A 01/11/2015   Procedure: Atrial Fibrillation Ablation;  Surgeon: [name], MD;  Location: [place] CV LAB;  Service: Cardiovascular;  Laterality: N/A;  . HAND SURGERY    .  LAPAROSCOPIC APPENDECTOMY N/A 02/13/2014   Procedure: APPENDECTOMY LAPAROSCOPIC;  Surgeon: [name], MD;  Location: [facility];  Service: General;  Laterality: N/A;  . MAZE N/A 06/30/2015   Procedure: MAZE;  Surgeon: Rexene Alberts, MD;  Location: [place];  Service: Open Heart Surgery;  Laterality: N/A;  . TEE WITHOUT CARDIOVERSION N/A 09/20/2014   Procedure: TRANSESOPHAGEAL ECHOCARDIOGRAM (TEE);  Surgeon: [name], MD;  Location: [place];  Service: Cardiovascular;   Laterality: N/A;  . TEE WITHOUT CARDIOVERSION N/A 01/10/2015   Procedure: TRANSESOPHAGEAL ECHOCARDIOGRAM (TEE);  Surgeon: Sanda Klein, MD;  Location: [facility] ENDOSCOPY;  Service: Cardiovascular;  Laterality: N/A;  . TEE WITHOUT CARDIOVERSION N/A 06/30/2015   Procedure: TRANSESOPHAGEAL ECHOCARDIOGRAM (TEE);  Surgeon: Rexene Alberts, MD;  Location: [place];  Service: Open Heart Surgery;  Laterality: N/A;  . TONSILLECTOMY    . WISDOM TOOTH EXTRACTION      Current Outpatient Medications  Medication Sig Dispense Refill  . carvedilol (COREG) 12.5 MG tablet Take 12.5 mg by mouth 2 (two) times daily with a meal.   3  . clorazepate (TRANXENE) 7.5 MG tablet Take 1 tablet (7.5 mg total) by mouth at bedtime. (Patient taking differently: Take 7.5 mg by mouth at bedtime as needed for anxiety or sleep. ) 30 tablet 1  . dofetilide (TIKOSYN) 500 MCG capsule Take 1 capsule (500 mcg total) by mouth 2 (two) times daily. 60 capsule 6  . DULoxetine (CYMBALTA) 60 MG capsule Take 60 mg by mouth every morning.     Marland Kitchen ELIQUIS 5 MG TABS tablet TAKE 1 TABLET BY MOUTH TWICE A DAY 60 tablet 10  . esomeprazole (NEXIUM) 20 MG capsule Take 20 mg by mouth at bedtime.     . furosemide (LASIX) 40 MG tablet Take 20 mg by mouth every morning.     Marland Kitchen KLOR-CON M20 20 MEQ tablet TAKE 1 TABLET BY MOUTH EVERY DAY (Patient taking differently: Take 20 mEq by mouth every morning. ) 30 tablet 6  . lisinopril (PRINIVIL,ZESTRIL) 30 MG tablet Take 30 mg by mouth 2 (two) times daily.     Marland Kitchen OZEMPIC, 1 MG/DOSE, 2 MG/1.5ML SOPN Inject 1 Dose into the skin every Wednesday.     . simvastatin (ZOCOR) 20 MG tablet Take 1 tablet (20 mg total) by mouth daily at 6 PM. 90 tablet 0  . Vitamin D, Ergocalciferol, (DRISDOL) 1.25 MG (50000 UT) CAPS capsule Take 50,000 Units by mouth every Wednesday.     . cholestyramine (QUESTRAN) 4 g packet Take 2 g by mouth as needed.     Marland Kitchen guaiFENesin 200 MG tablet Take 200 mg by mouth 2 (two) times daily as needed (for cold).      No current facility-administered medications for this encounter.     Allergies  Allergen Reactions  . Flecainide Shortness Of Breath and Other (See Comments)    EXTREME SOB  . Multaq [Dronedarone] Shortness Of Breath  . Toprol Xl [Metoprolol Tartrate] Shortness Of Breath  . Penicillins Hives    Has patient had a PCN reaction causing immediate rash, facial/tongue/throat swelling, SOB or lightheadedness with hypotension:unsure Has patient had a PCN reaction causing severe rash involving mucus membranes or skin necrosis:unsure Has patient had a PCN reaction that required hospitalization:No Has patient had a PCN reaction occurring within the last 10 years:No If all of the above answers are "NO", then may proceed with Cephalosporin use.     Social History   Socioeconomic History  . Marital status: Married  Spouse name: Not on file  . Number of children: 1  . Years of education: Not on file  . Highest education level: Not on file  Occupational History  . Occupation: Radio broadcast assistant: [employer]  Social Needs  . Financial resource strain: Not on file  . Food insecurity:    Worry: Not on file    Inability: Not on file  . Transportation needs:    Medical: Not on file    Non-medical: Not on file  Tobacco Use  . Smoking status: Former Smoker    Packs/day: 0.50    Years: 30.00    Pack years: 15.00    Types: Cigarettes    Last attempt to quit: 07/23/2006    Years since quitting: 12.2  . Smokeless tobacco: Never Used  Substance and Sexual Activity  . Alcohol use: Yes    Alcohol/week: 7.0 - 14.0 standard drinks    Types: 7 - 14 Standard drinks or equivalent per week    Comment: social  . Drug use: No  . Sexual activity: Not on file  Lifestyle  . Physical activity:    Days per week: Not on file    Minutes per session: Not on file  . Stress: Not on file  Relationships  . Social connections:    Talks on phone: Not on file    Gets together: Not on file    Attends  religious service: Not on file    Active member of club or organization: Not on file    Attends meetings of clubs or organizations: Not on file    Relationship status: Not on file  . Intimate partner violence:    Fear of current or ex partner: Not on file    Emotionally abused: Not on file    Physically abused: Not on file    Forced sexual activity: Not on file  Other Topics Concern  . Not on file  Social History Narrative   Pt works as a Secondary school teacher    Family History  Adopted: Yes  Problem Relation Age of Onset  . Other Unknown        ADOPTED  . Hypertrophic cardiomyopathy Son        son had septal myomectomy at age 87    ROS- All systems are reviewed and negative except as per the HPI above  Physical Exam: Vitals:   10/29/18 1524  BP: (!) 158/92  Pulse: 69  Weight: 131.9 kg  Height: 5\' 8"  (1.727 m)   Wt Readings from Last 3 Encounters:  10/29/18 131.9 kg  08/04/18 129.3 kg  07/03/18 128.1 kg    Labs: Lab Results  Component Value Date   NA 136 10/29/2018   K 3.9 10/29/2018   CL 103 10/29/2018   CO2 26 10/29/2018   GLUCOSE 109 (H) 10/29/2018   BUN 7 10/29/2018   CREATININE 0.92 10/29/2018   CALCIUM 9.0 10/29/2018   MG 1.9 10/29/2018   Lab Results  Component Value Date   INR 1.37 06/30/2015   Lab Results  Component Value Date   CHOL 228 (H) 07/19/2014   HDL 30 (L) 07/19/2014   LDLCALC 130 (H) 07/19/2014   TRIG 338 (H) 07/19/2014    GEN- The patient is well appearing obese male, alert and oriented x 3 today.   HEENT-head normocephalic, atraumatic, sclera clear, conjunctiva pink, hearing intact, trachea midline. Lungs- Clear to ausculation bilaterally, normal work of breathing Heart- Regular rate and rhythm, no murmurs, rubs or gallops  GI- soft, NT, ND, + BS Extremities- no clubbing, cyanosis, or edema MS- no significant deformity or atrophy Skin- no rash or lesion Psych- euthymic mood, full affect Neuro- strength and sensation are intact    EKG- SR HR 69, PR 154, QRS 112, QTc 495  Assessment and Plan:  1. Persistent atrial fibrillation.  S/p MAZE Patient s/p loading with Tikosyn. Symptomatically has greatly improved with minimal symptoms of palpitations. Continue dofetilide 500 mcg. QT stable (504 at last visit) Continue Eliquis 5 mg BID Continue Coreg 12.5 mg BID Bmet/mag today  2. Obesity Body mass index is 44.22 kg/m. We had a long discussion about lifestyle modification including regular physical activity and weight loss. He is not ready to consider bariatric surgery at this time.  3. HTN Mildly elevated today. Has been normal on previous visits. Continue present therapy for now.   4. CAD No anginal symptoms. Continue present therapy and risk factor modification.  5. Hypertrophic nonobstructive CM Focal basal and mid septal hypertrophy.  Stable, no changes today.  Follow up in the afib clinic in 3 months.  [facility] [address] [place], [place] [address] [phone]

## 2018-11-03 ENCOUNTER — Ambulatory Visit (HOSPITAL_COMMUNITY): Payer: BLUE CROSS/BLUE SHIELD | Admitting: Nurse Practitioner

## 2019-01-02 ENCOUNTER — Other Ambulatory Visit: Payer: Self-pay | Admitting: Internal Medicine

## 2019-02-02 ENCOUNTER — Ambulatory Visit (HOSPITAL_COMMUNITY): Payer: BLUE CROSS/BLUE SHIELD | Admitting: Physician Assistant

## 2019-02-03 ENCOUNTER — Other Ambulatory Visit (HOSPITAL_COMMUNITY): Payer: Self-pay | Admitting: *Deleted

## 2019-02-03 DIAGNOSIS — I4819 Other persistent atrial fibrillation: Secondary | ICD-10-CM

## 2019-02-03 NOTE — Progress Notes (Signed)
error 

## 2019-02-04 ENCOUNTER — Other Ambulatory Visit (HOSPITAL_COMMUNITY): Payer: Self-pay | Admitting: *Deleted

## 2019-02-04 MED ORDER — DOFETILIDE 500 MCG PO CAPS: 500.0000 ug | ORAL_CAPSULE | Freq: Two times a day (BID) | ORAL | 6 refills | Status: DC

## 2019-02-05 ENCOUNTER — Encounter: Payer: Self-pay | Admitting: Internal Medicine

## 2019-02-05 ENCOUNTER — Telehealth (HOSPITAL_COMMUNITY): Payer: Self-pay | Admitting: *Deleted

## 2019-02-05 NOTE — Telephone Encounter (Signed)
Patient had EKG/Labs done at PCP office for [place] and having results faxed for review.

## 2019-02-18 ENCOUNTER — Encounter (HOSPITAL_COMMUNITY): Payer: Self-pay | Admitting: *Deleted

## 2019-06-25 ENCOUNTER — Other Ambulatory Visit: Payer: Self-pay | Admitting: Internal Medicine

## 2019-08-08 ENCOUNTER — Other Ambulatory Visit: Payer: Self-pay | Admitting: Internal Medicine

## 2019-08-10 NOTE — Telephone Encounter (Signed)
Age 58, weight 132kg, SCr 0.77 on 02/05/19, afib indication, last OV March 2020

## 2019-08-30 ENCOUNTER — Other Ambulatory Visit (HOSPITAL_COMMUNITY): Payer: Self-pay | Admitting: Nurse Practitioner

## 2019-08-31 ENCOUNTER — Other Ambulatory Visit (HOSPITAL_COMMUNITY): Payer: Self-pay | Admitting: *Deleted

## 2019-09-19 ENCOUNTER — Other Ambulatory Visit: Payer: Self-pay | Admitting: Internal Medicine

## 2019-09-21 MED ORDER — POTASSIUM CHLORIDE CRYS ER 20 MEQ PO TBCR: 20.0000 meq | EXTENDED_RELEASE_TABLET | Freq: Every day | ORAL | 0 refills | Status: DC

## 2019-10-04 ENCOUNTER — Other Ambulatory Visit (HOSPITAL_COMMUNITY): Payer: Self-pay | Admitting: Physician Assistant

## 2019-10-05 ENCOUNTER — Other Ambulatory Visit (HOSPITAL_COMMUNITY): Payer: Self-pay | Admitting: *Deleted

## 2019-10-05 MED ORDER — DOFETILIDE 500 MCG PO CAPS: 500.0000 ug | ORAL_CAPSULE | Freq: Two times a day (BID) | ORAL | 0 refills | Status: DC

## 2019-10-10 ENCOUNTER — Other Ambulatory Visit: Payer: Self-pay | Admitting: Internal Medicine

## 2019-10-16 ENCOUNTER — Other Ambulatory Visit: Payer: Self-pay

## 2019-10-16 ENCOUNTER — Ambulatory Visit (HOSPITAL_COMMUNITY)
Admission: RE | Admit: 2019-10-16 | Discharge: 2019-10-16 | Disposition: A | Discharge status: Home / Self Care | Payer: BC Managed Care – PPO | Source: Ambulatory Visit | Attending: Nurse Practitioner | Admitting: Nurse Practitioner

## 2019-10-16 ENCOUNTER — Encounter (HOSPITAL_COMMUNITY): Payer: Self-pay | Admitting: Nurse Practitioner

## 2019-10-16 VITALS — BP 164/100 | HR 72 | Ht 68.0 in | Wt 289.6 lb

## 2019-10-16 DIAGNOSIS — F419 Anxiety disorder, unspecified: Secondary | ICD-10-CM | POA: Diagnosis not present

## 2019-10-16 DIAGNOSIS — Z87891 Personal history of nicotine dependence: Secondary | ICD-10-CM | POA: Diagnosis not present

## 2019-10-16 DIAGNOSIS — Z7901 Long term (current) use of anticoagulants: Secondary | ICD-10-CM | POA: Diagnosis not present

## 2019-10-16 DIAGNOSIS — I110 Hypertensive heart disease with heart failure: Secondary | ICD-10-CM | POA: Diagnosis not present

## 2019-10-16 DIAGNOSIS — E785 Hyperlipidemia, unspecified: Secondary | ICD-10-CM | POA: Insufficient documentation

## 2019-10-16 DIAGNOSIS — G4733 Obstructive sleep apnea (adult) (pediatric): Secondary | ICD-10-CM | POA: Diagnosis not present

## 2019-10-16 DIAGNOSIS — D6869 Other thrombophilia: Secondary | ICD-10-CM

## 2019-10-16 DIAGNOSIS — I4819 Other persistent atrial fibrillation: Secondary | ICD-10-CM

## 2019-10-16 DIAGNOSIS — I5032 Chronic diastolic (congestive) heart failure: Secondary | ICD-10-CM | POA: Insufficient documentation

## 2019-10-16 DIAGNOSIS — Z79899 Other long term (current) drug therapy: Secondary | ICD-10-CM | POA: Insufficient documentation

## 2019-10-16 DIAGNOSIS — K219 Gastro-esophageal reflux disease without esophagitis: Secondary | ICD-10-CM | POA: Insufficient documentation

## 2019-10-16 DIAGNOSIS — I2510 Atherosclerotic heart disease of native coronary artery without angina pectoris: Secondary | ICD-10-CM | POA: Insufficient documentation

## 2019-10-16 DIAGNOSIS — F329 Major depressive disorder, single episode, unspecified: Secondary | ICD-10-CM | POA: Diagnosis not present

## 2019-10-16 DIAGNOSIS — Z951 Presence of aortocoronary bypass graft: Secondary | ICD-10-CM | POA: Insufficient documentation

## 2019-10-16 DIAGNOSIS — I4891 Unspecified atrial fibrillation: Secondary | ICD-10-CM | POA: Insufficient documentation

## 2019-10-16 LAB — CBC
HCT: 41.7 % (ref 39.0–52.0)
Hemoglobin: 13.4 g/dL (ref 13.0–17.0)
MCH: 30.1 pg (ref 26.0–34.0)
MCHC: 32.1 g/dL (ref 30.0–36.0)
MCV: 93.7 fL (ref 80.0–100.0)
Platelets: 249 10*3/uL (ref 150–400)
RBC: 4.45 MIL/uL (ref 4.22–5.81)
RDW: 13.5 % (ref 11.5–15.5)
WBC: 8.4 10*3/uL (ref 4.0–10.5)
nRBC: 0 % (ref 0.0–0.2)

## 2019-10-16 LAB — BASIC METABOLIC PANEL
Anion gap: 9 (ref 5–15)
BUN: 8 mg/dL (ref 6–20)
CO2: 24 mmol/L (ref 22–32)
Calcium: 9 mg/dL (ref 8.9–10.3)
Chloride: 102 mmol/L (ref 98–111)
Creatinine, Ser: 0.88 mg/dL (ref 0.61–1.24)
GFR calc Af Amer: >60 mL/min (ref >60)
GFR calc non Af Amer: >60 mL/min (ref >60)
Glucose, Bld: 127 mg/dL — ABNORMAL HIGH (ref 70–99)
Potassium: 4.1 mmol/L (ref 3.5–5.1)
Sodium: 135 mmol/L (ref 135–145)

## 2019-10-16 LAB — MAGNESIUM: Magnesium: 2.1 mg/dL (ref 1.7–2.4)

## 2019-10-16 MED ORDER — POTASSIUM CHLORIDE CRYS ER 20 MEQ PO TBCR: 20.0000 meq | EXTENDED_RELEASE_TABLET | Freq: Every day | ORAL | 6 refills | Status: DC

## 2019-10-16 MED ORDER — DOFETILIDE 500 MCG PO CAPS: 500.0000 ug | ORAL_CAPSULE | Freq: Two times a day (BID) | ORAL | 6 refills | Status: DC

## 2019-10-16 NOTE — Progress Notes (Signed)
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 male with a h/o HOCM, CAD afib, sp ablation x 2 and Maze and  by pass by Dr. Roxy Manns in 06/2015. He now lives in [place]. He continues on [place]. Very few episodes of afib. Conitnues ion eliquis with a CHA2DS2VASc x 3.   Today, he denies symptoms of palpitations, chest pain, shortness of breath, orthopnea, PND, lower extremity edema, dizziness, presyncope, syncope, or neurologic sequela. The patient is tolerating medications without difficulties and is otherwise without complaint today.   Past Medical History:  Diagnosis Date  . Anxiety   . Arthritis   . Coronary artery disease    a. s/p CABG 06/2015 LIMA to D1 and distal LAD, sequentially  . Depression   . Diastolic CHF, chronic ([place])   . Essential hypertension   . Gallstones   . GERD (gastroesophageal reflux disease)   . History of echocardiogram    Echo done in [place] (Dr. Clare Gandy) 3/18: EF 40-45, inf HK, diast dysfn, mild TR, RVSP 35  . History of hiatal hernia   . History of nuclear stress test    Myoview in [place], [place] 3/18: No ischemia, EF 64  . Hyperlipidemia   . Hypertrophic cardiomyopathy ([address])    cMRI 8/18: c/w non-obs resting HOCM, septal thickness 28 mm, no obvious SAM, near mid cavity obliteration, EF 58, significant gadolinium uptake in mid, ant and inf septum and ant wall suggesting presence of myofibrillar disarray, small pericardial eff, severe LAE  . Mitral regurgitation   . Morbid obesity ([place])   . Obstructive sleep apnea    a. on CPAP  . Paroxysmal atrial fibrillation (HCC)    a.  previously intolerant of Flecainide and Multaq b. PVI by Dr [name] 09/21/14 c. s/p MAZE Dr Roxy Manns 06/2015  . S/P Maze operation for atrial fibrillation 06/30/2015   Complete bilateral atrial lesion set using bipolar radiofrequency and cryothermy ablation with clipping of LA appendage via conventional median  sternotomy    Past Surgical History:  Procedure Laterality Date  . ABLATION  09/21/14   PVI by Dr [name]  . ATRIAL FIBRILLATION ABLATION N/A 09/21/2014   Procedure: ATRIAL FIBRILLATION ABLATION;  Surgeon: [name], MD;  Location: [facility] CATH LAB;  Service: Cardiovascular;  Laterality: N/A;  . CARDIAC CATHETERIZATION N/A 06/20/2015   Procedure: Right/Left Heart Cath and Coronary Angiography;  Surgeon: Burnell Blanks, MD;  Location: [place] CV LAB;  Service: Cardiovascular;  Laterality: N/A;  . CHOLECYSTECTOMY  08/05/2012   Procedure: LAPAROSCOPIC CHOLECYSTECTOMY WITH INTRAOPERATIVE CHOLANGIOGRAM;  Surgeon: [name], MD,FACS;  Location: [place];  Service: General;  Laterality: N/A;  . CORONARY ARTERY BYPASS GRAFT N/A 06/30/2015   Procedure: CORONARY ARTERY BYPASS GRAFTING (CABG)X2 SEQ LIMA-DIAG-LAD;  Surgeon: Rexene Alberts, MD;  Location: [facility];  Service: Open Heart Surgery;  Laterality: N/A;  . ELECTROPHYSIOLOGIC STUDY N/A 01/11/2015   Procedure: Atrial Fibrillation Ablation;  Surgeon: [name], MD;  Location: [place] CV LAB;  Service: Cardiovascular;  Laterality: N/A;  . HAND SURGERY    . LAPAROSCOPIC APPENDECTOMY N/A 02/13/2014   Procedure: APPENDECTOMY LAPAROSCOPIC;  Surgeon: [name], MD;  [facility];  Service: General;  Laterality: N/A;  . MAZE N/A 06/30/2015   Procedure: MAZE;  Surgeon: Rexene Alberts, MD;  Location: [place];  Service: Open Heart Surgery;  Laterality: N/A;  . TEE WITHOUT CARDIOVERSION  N/A 09/20/2014   Procedure: TRANSESOPHAGEAL ECHOCARDIOGRAM (TEE);  Surgeon: [name], MD;  Location: [place];  Service: Cardiovascular;  Laterality: N/A;  . TEE WITHOUT CARDIOVERSION N/A 01/10/2015   Procedure: TRANSESOPHAGEAL ECHOCARDIOGRAM (TEE);  Surgeon: Sanda Klein, MD;  Location: [facility] ENDOSCOPY;  Service: Cardiovascular;  Laterality: N/A;  . TEE WITHOUT CARDIOVERSION N/A 06/30/2015   Procedure: TRANSESOPHAGEAL ECHOCARDIOGRAM (TEE);  Surgeon:  Rexene Alberts, MD;  Location: [place];  Service: Open Heart Surgery;  Laterality: N/A;  . TONSILLECTOMY    . WISDOM TOOTH EXTRACTION      Current Outpatient Medications  Medication Sig Dispense Refill  . carvedilol (COREG) 12.5 MG tablet Take 12.5 mg by mouth 2 (two) times daily with a meal.   3  . cholestyramine (QUESTRAN) 4 g packet Take 2 g by mouth as needed.     . dofetilide (TIKOSYN) 500 MCG capsule Take 1 capsule (500 mcg total) by mouth 2 (two) times daily. 60 capsule 6  . DULoxetine (CYMBALTA) 60 MG capsule Take 60 mg by mouth every morning.     Marland Kitchen ELIQUIS 5 MG TABS tablet TAKE 1 TABLET BY MOUTH TWICE A DAY 60 tablet 5  . esomeprazole (NEXIUM) 20 MG capsule Take 20 mg by mouth at bedtime.     Marland Kitchen lisinopril (PRINIVIL,ZESTRIL) 30 MG tablet Take 40 mg by mouth 2 (two) times daily.     Marland Kitchen OZEMPIC, 1 MG/DOSE, 2 MG/1.5ML SOPN Inject 1 Dose into the skin every Wednesday.     . potassium chloride SA (KLOR-CON M20) 20 MEQ tablet Take 1 tablet (20 mEq total) by mouth daily. 30 tablet 6  . simvastatin (ZOCOR) 20 MG tablet Take 1 tablet (20 mg total) by mouth daily at 6 PM. 90 tablet 0  . tamsulosin (FLOMAX) 0.4 MG CAPS capsule Take 0.4 mg by mouth daily.    . Vitamin D, Ergocalciferol, (DRISDOL) 1.25 MG (50000 UT) CAPS capsule Take 50,000 Units by mouth every Wednesday.      No current facility-administered medications for this encounter.    Allergies  Allergen Reactions  . Flecainide Shortness Of Breath and Other (See Comments)    EXTREME SOB  . Multaq [Dronedarone] Shortness Of Breath  . Toprol Xl [Metoprolol Tartrate] Shortness Of Breath  . Penicillins Hives    Has patient had a PCN reaction causing immediate rash, facial/tongue/throat swelling, SOB or lightheadedness with hypotension:unsure Has patient had a PCN reaction causing severe rash involving mucus membranes or skin necrosis:unsure Has patient had a PCN reaction that required hospitalization:No Has patient had a PCN reaction  occurring within the last 10 years:No If all of the above answers are "NO", then may proceed with Cephalosporin use.     Social History   Socioeconomic History  . Marital status: Married    Spouse name: Not on file  . Number of children: 1  . Years of education: Not on file  . Highest education level: Not on file  Occupational History  . Occupation: Radio broadcast assistant: [employer]  Tobacco Use  . Smoking status: Former Smoker    Packs/day: 0.50    Years: 30.00    Pack years: 15.00    Types: Cigarettes    Quit date: 07/23/2006    Years since quitting: 13.2  . Smokeless tobacco: Never Used  Substance and Sexual Activity  . Alcohol use: Yes    Alcohol/week: 7.0 - 14.0 standard drinks    Types: 7 - 14 Standard drinks or equivalent per week  Comment: social  . Drug use: No  . Sexual activity: Not on file  Other Topics Concern  . Not on file  Social History Narrative   Pt works as a Dealer Strain:   . Difficulty of Paying Living Expenses: Not on file  Food Insecurity:   . Worried About Charity fundraiser in the Last Year: Not on file  . Ran Out of Food in the Last Year: Not on file  Transportation Needs:   . Lack of Transportation (Medical): Not on file  . Lack of Transportation (Non-Medical): Not on file  Physical Activity:   . Days of Exercise per Week: Not on file  . Minutes of Exercise per Session: Not on file  Stress:   . Feeling of Stress : Not on file  Social Connections:   . Frequency of Communication with Friends and Family: Not on file  . Frequency of Social Gatherings with Friends and Family: Not on file  . Attends Religious Services: Not on file  . Active Member of Clubs or Organizations: Not on file  . Attends Archivist Meetings: Not on file  . Marital Status: Not on file  Intimate Partner Violence:   . Fear of Current or Ex-Partner: Not on file  . Emotionally Abused: Not on file  .  Physically Abused: Not on file  . Sexually Abused: Not on file    Family History  Adopted: Yes  Problem Relation Age of Onset  . Other Other        ADOPTED  . Hypertrophic cardiomyopathy Son        son had septal myomectomy at age 59    ROS- All systems are reviewed and negative except as per the HPI above  Physical Exam: Vitals:   10/16/19 1122  BP: (!) 164/100  Pulse: 72  Weight: 131.4 kg  Height: 5\' 8"  (1.727 m)   Wt Readings from Last 3 Encounters:  10/16/19 131.4 kg  10/29/18 131.9 kg  08/04/18 129.3 kg    Labs: Lab Results  Component Value Date   NA 135 10/16/2019   K 4.1 10/16/2019   CL 102 10/16/2019   CO2 24 10/16/2019   GLUCOSE 127 (H) 10/16/2019   BUN 8 10/16/2019   CREATININE 0.88 10/16/2019   CALCIUM 9.0 10/16/2019   MG 2.1 10/16/2019   Lab Results  Component Value Date   INR 1.37 06/30/2015   Lab Results  Component Value Date   CHOL 228 (H) 07/19/2014   HDL 30 (L) 07/19/2014   LDLCALC 130 (H) 07/19/2014   TRIG 338 (H) 07/19/2014     GEN- The patient is well appearing, alert and oriented x 3 today.   Head- normocephalic, atraumatic Eyes-  Sclera clear, conjunctiva pink Ears- hearing intact Oropharynx- clear Neck- supple, no JVP Lymph- no cervical lymphadenopathy Lungs- Clear to ausculation bilaterally, normal work of breathing Heart- Regular rate and rhythm, no murmurs, rubs or gallops, PMI not laterally displaced GI- soft, NT, ND, + BS Extremities- no clubbing, cyanosis, or edema MS- no significant deformity or atrophy Skin- no rash or lesion Psych- euthymic mood, full affect Neuro- strength and sensation are intact  EKG-NSR at 72 bpm, PR int 158 ms, qrs int 112 ms, qtc 512 ms on ekg( appears to be similar to pervious ekg's, corrected to 482 ms,  Epic records reviewed    Assessment and Plan: 1.Afib  Quiet  Continue on tikosyn 500 mcg  bid  Continue carvedilol 12.5 mg bid  Bmet/mag/cbc today   2. HTN PCP is addressing  this with recent increase in lisinopril Home BP's seem to be controlled  F/u with [name] in 6 months   Geroge Baseman. Kveon Casanas, [facility] [address] [place], [place] [address] [phone]

## 2020-01-08 ENCOUNTER — Other Ambulatory Visit: Payer: Self-pay | Admitting: Internal Medicine

## 2020-01-08 NOTE — Telephone Encounter (Signed)
Prescription refill request for Eliquis received.  Last office visit: Kayleen Memos 10/16/2019 Scr: 0.88, 10/16/2019 Age: 59 y.o. Weight: 131.4 kg   Prescription refill sent.

## 2020-04-14 ENCOUNTER — Other Ambulatory Visit (HOSPITAL_COMMUNITY): Payer: Self-pay | Admitting: Nurse Practitioner

## 2020-05-29 ENCOUNTER — Other Ambulatory Visit (HOSPITAL_COMMUNITY): Payer: Self-pay | Admitting: Nurse Practitioner

## 2020-07-04 ENCOUNTER — Other Ambulatory Visit (HOSPITAL_COMMUNITY): Payer: Self-pay | Admitting: *Deleted

## 2020-07-04 MED ORDER — DOFETILIDE 500 MCG PO CAPS: 500.0000 ug | ORAL_CAPSULE | Freq: Two times a day (BID) | ORAL | 0 refills | Status: AC

## 2020-08-03 ENCOUNTER — Telehealth: Payer: Self-pay

## 2020-08-03 NOTE — Telephone Encounter (Signed)
Eliquis P/A form completed and will be placed in Dr Bonita Quin box for signature.

## 2020-08-09 NOTE — Telephone Encounter (Signed)
Form signed and faxed successfully.

## 2022-09-27 ENCOUNTER — Encounter (HOSPITAL_COMMUNITY): Payer: Self-pay | Admitting: *Deleted
